# Patient Record
Sex: Female | Born: 1935 | Race: White | Hispanic: No | Smoking: Never smoker
Health system: Southern US, Community
[De-identification: ages and names within clinical notes are randomized; demographics above are authoritative.]

## PROBLEM LIST (undated history)

## (undated) DIAGNOSIS — R002 Palpitations: Secondary | ICD-10-CM

## (undated) DIAGNOSIS — I5032 Chronic diastolic (congestive) heart failure: Secondary | ICD-10-CM

## (undated) DIAGNOSIS — I10 Essential (primary) hypertension: Secondary | ICD-10-CM

## (undated) DIAGNOSIS — J189 Pneumonia, unspecified organism: Secondary | ICD-10-CM

## (undated) DIAGNOSIS — I447 Left bundle-branch block, unspecified: Secondary | ICD-10-CM

## (undated) DIAGNOSIS — R011 Cardiac murmur, unspecified: Secondary | ICD-10-CM

## (undated) DIAGNOSIS — I517 Cardiomegaly: Secondary | ICD-10-CM

## (undated) HISTORY — DX: Left bundle-branch block, unspecified: I44.7

## (undated) HISTORY — DX: Cardiac murmur, unspecified: R01.1

## (undated) HISTORY — PX: EYE SURGERY: SHX253

## (undated) HISTORY — DX: Cardiomegaly: I51.7

## (undated) HISTORY — DX: Pneumonia, unspecified organism: J18.9

## (undated) HISTORY — DX: Palpitations: R00.2

## (undated) HISTORY — DX: Chronic diastolic (congestive) heart failure: I50.32

---

## 1984-06-24 HISTORY — PX: APPENDECTOMY: SHX54

## 1984-06-24 HISTORY — PX: WRIST SURGERY: SHX841

## 2004-09-14 ENCOUNTER — Ambulatory Visit: Payer: Self-pay

## 2005-04-15 ENCOUNTER — Ambulatory Visit: Payer: Self-pay | Admitting: Internal Medicine

## 2005-08-27 ENCOUNTER — Ambulatory Visit: Payer: Self-pay | Admitting: Internal Medicine

## 2006-09-02 ENCOUNTER — Ambulatory Visit: Payer: Self-pay | Admitting: Internal Medicine

## 2007-09-08 ENCOUNTER — Ambulatory Visit: Payer: Self-pay | Admitting: Internal Medicine

## 2008-07-20 ENCOUNTER — Emergency Department: Payer: Self-pay | Admitting: Emergency Medicine

## 2008-12-14 ENCOUNTER — Ambulatory Visit: Payer: Self-pay | Admitting: Internal Medicine

## 2009-09-04 DIAGNOSIS — I10 Essential (primary) hypertension: Secondary | ICD-10-CM | POA: Insufficient documentation

## 2009-09-04 DIAGNOSIS — M810 Age-related osteoporosis without current pathological fracture: Secondary | ICD-10-CM | POA: Insufficient documentation

## 2009-10-05 ENCOUNTER — Ambulatory Visit: Payer: Self-pay | Admitting: Family Medicine

## 2010-06-26 ENCOUNTER — Ambulatory Visit: Payer: Self-pay | Admitting: Family Medicine

## 2010-07-03 ENCOUNTER — Ambulatory Visit: Payer: Self-pay | Admitting: Family Medicine

## 2010-08-23 LAB — HM MAMMOGRAPHY: HM MAMMO: NORMAL

## 2012-07-10 ENCOUNTER — Ambulatory Visit: Payer: Self-pay | Admitting: Family Medicine

## 2013-05-01 ENCOUNTER — Emergency Department: Payer: Self-pay | Admitting: Emergency Medicine

## 2014-08-23 ENCOUNTER — Encounter: Payer: Self-pay | Admitting: Nurse Practitioner

## 2014-08-23 ENCOUNTER — Ambulatory Visit (INDEPENDENT_AMBULATORY_CARE_PROVIDER_SITE_OTHER): Payer: Medicare Other | Admitting: Nurse Practitioner

## 2014-08-23 ENCOUNTER — Encounter (INDEPENDENT_AMBULATORY_CARE_PROVIDER_SITE_OTHER): Payer: Self-pay

## 2014-08-23 VITALS — BP 158/70 | HR 74 | Temp 98.2°F | Resp 12 | Ht 67.0 in | Wt 170.4 lb

## 2014-08-23 DIAGNOSIS — Z7689 Persons encountering health services in other specified circumstances: Secondary | ICD-10-CM

## 2014-08-23 DIAGNOSIS — Z7189 Other specified counseling: Secondary | ICD-10-CM | POA: Diagnosis not present

## 2014-08-23 DIAGNOSIS — I10 Essential (primary) hypertension: Secondary | ICD-10-CM | POA: Diagnosis not present

## 2014-08-23 NOTE — Progress Notes (Signed)
Pre visit review using our clinic review tool, if applicable. No additional management support is needed unless otherwise documented below in the visit note. 

## 2014-08-23 NOTE — Progress Notes (Signed)
Subjective:    Patient ID: Laurie Patel, female    DOB: 1928-05-08, 81 y.o.   MRN: [id number]  HPI  Laurie Patel is a 81 yo female establishing care.   1) New Pt info:   Diet- Normal diet, eats at home   Exercise- No formal, works in yard   Immunizations- See health maintenance tab  Mammogram- Last 3-4 years ago, aged out   Bone Density- 1980's  Colonoscopy- No problems  Eye Exam- Once a year  Dental Exam- Not up to date   2) Chronic Problems-  Irregular heart rhythm- she was told this and believes it may be due to her taking Lisinopril   3) Acute Problems-  SOB since taking Lisinopril   Review of Systems  Constitutional: Negative for fever, chills, diaphoresis, fatigue and unexpected weight change.  HENT: Negative for tinnitus and trouble swallowing.   Laurie Patel: Negative for visual disturbance.  Respiratory: Positive for shortness of breath. Negative for cough, chest tightness and wheezing.   Cardiovascular: Negative for chest pain, palpitations and leg swelling.  Gastrointestinal: Negative for nausea, vomiting, abdominal pain, diarrhea, constipation and blood in stool.  Endocrine: Negative for polydipsia, polyphagia and polyuria.  Genitourinary: Negative for dysuria, vaginal discharge and vaginal pain.  Musculoskeletal: Negative for myalgias, back pain, arthralgias and gait problem.  Skin: Negative for color change and rash.  Neurological: Negative for dizziness, weakness, numbness and headaches.  Hematological: Does not bruise/bleed easily.  Psychiatric/Behavioral: Negative for suicidal ideas and sleep disturbance. The patient is not nervous/anxious.    Past Medical History  Diagnosis Date  . Heart murmur     History   Social History  . Marital Status: Married    Spouse Name: N/A  . Number of Children: N/A  . Years of Education: N/A   Occupational History  . Not on file.   Social History Main Topics  . Smoking status: Never Smoker   . Smokeless tobacco: Never  Used  . Alcohol Use: No  . Drug Use: No  . Sexual Activity: No   Other Topics Concern  . Not on file   Social History Narrative   L[place] in [place]    Owns a [employer]    1 dog    Past Surgical History  Procedure Laterality Date  . Wrist surgery Left 1986    ORIF   . Appendectomy  1986    Family History  Problem Relation Age of Onset  . Cancer Mother 5273    Breast Cancer  . Heart disease Father 7483    Congestive Heart Failure  . COPD Father 3983    Emphysema  . Heart disease Brother 2078    massive heart attack  . Heart disease Maternal Grandmother   . Heart disease Maternal Grandfather   . Early death Paternal Grandmother     Early death unsure age  . Heart disease Paternal Grandfather   . Obesity Paternal Grandfather     No Known Allergies  No current outpatient prescriptions on file prior to visit.   No current facility-administered medications on file prior to visit.       Objective:   Physical Exam  Constitutional: She is oriented to person, place, and time. She appears well-developed and well-nourished. No distress.  BP 158/70 mmHg  Pulse 74  Temp(Src) 98.2 F (36.8 C) (Oral)  Resp 12  Ht 5\' 7"  (1.702 m)  Wt 170 lb 6.4 oz (77.293 kg)  BMI 26.68 kg/m2  SpO2 98%  HENT:  Head: Normocephalic and atraumatic.  Right Ear: External ear normal.  Left Ear: External ear normal.  Molloy: Right eye exhibits no discharge. Left eye exhibits no discharge. No scleral icterus.  Neck: Normal range of motion. Neck supple. No thyromegaly present.  Cardiovascular: Normal rate and regular rhythm.  Exam reveals no gallop and no friction rub.   Murmur heard. Pulmonary/Chest: Effort normal and breath sounds normal. No respiratory distress. She has no wheezes. She has no rales. She exhibits no tenderness.  Lymphadenopathy:    She has no cervical adenopathy.  Neurological: She is alert and oriented to person, place, and time. No cranial nerve deficit. She exhibits  normal muscle tone. Coordination normal.  Skin: Skin is warm and dry. No rash noted. She is not diaphoretic.  Psychiatric: She has a normal mood and affect. Her behavior is normal. Judgment and thought content normal.      Assessment & Plan:

## 2014-08-23 NOTE — Patient Instructions (Signed)
Bring your BP machine to check in the next 3 weeks.   Bring your recorded numbers from home.   Welcome to [facility]!

## 2014-08-27 DIAGNOSIS — I10 Essential (primary) hypertension: Secondary | ICD-10-CM | POA: Insufficient documentation

## 2014-08-27 NOTE — Assessment & Plan Note (Signed)
Discussed acute and chronic issues. Reviewed health maintenance measures, PFSHx, and immunizations. Obtain records from previous facility.   

## 2014-08-27 NOTE — Assessment & Plan Note (Signed)
Asked pt to bring her machine from home to compare with ours. She needs to record numbers also and bring to next visit. Will look into other medications she can try instead of lisinopril. Pt agreeable to this. FU in 3 weeks.

## 2014-09-14 ENCOUNTER — Encounter: Payer: Self-pay | Admitting: Nurse Practitioner

## 2014-09-14 ENCOUNTER — Ambulatory Visit (INDEPENDENT_AMBULATORY_CARE_PROVIDER_SITE_OTHER): Payer: Medicare Other | Admitting: Nurse Practitioner

## 2014-09-14 VITALS — BP 142/66 | HR 77 | Resp 14 | Ht 67.0 in | Wt 170.2 lb

## 2014-09-14 DIAGNOSIS — I10 Essential (primary) hypertension: Secondary | ICD-10-CM

## 2014-09-14 MED ORDER — LISINOPRIL 10 MG PO TABS: 10.0000 mg | ORAL_TABLET | Freq: Every day | ORAL | Status: DC

## 2014-09-14 NOTE — Patient Instructions (Signed)
Keep up the good work

## 2014-09-14 NOTE — Progress Notes (Signed)
   Subjective:    Patient ID: Laurie Patel, female    DOB: Sep 08, 1927, 81 y.o.   MRN: [id number]  HPI  Laurie Patel is a 81 yo female here for a 3 week follow up.   1) Lost 8 lbs before coming here to our clinic.  Put some weight on during holidays 2 lbs down according to home scales Diet is not eating a lunch typically  Trying to eat light and watching salt   2) Takes BP medication in morning (forgets sometimes)  132/60 lowest 175/66 highest range Took fish oil tablets - stopped taking   Did not do well on statins- joint pain   Review of Systems  Constitutional: Negative for fever, chills, diaphoresis and fatigue.  Respiratory: Negative for chest tightness, shortness of breath and wheezing.   Cardiovascular: Negative for chest pain, palpitations and leg swelling.  Gastrointestinal: Negative for nausea, vomiting and diarrhea.  Skin: Negative for rash.  Neurological: Negative for dizziness, weakness, numbness and headaches.  Psychiatric/Behavioral: The patient is not nervous/anxious.       Objective:   Physical Exam  Constitutional: She is oriented to person, place, and time. She appears well-developed and well-nourished. No distress.  BP 142/66 mmHg  Pulse 77  Resp 14  Ht 5\' 7"  (1.702 m)  Wt 170 lb 4 oz (77.225 kg)  BMI 26.66 kg/m2  SpO2 96%   HENT:  Head: Normocephalic and atraumatic.  Right Ear: External ear normal.  Left Ear: External ear normal.  Cardiovascular: Normal rate, regular rhythm and normal heart sounds.  Exam reveals no gallop and no friction rub.   No murmur heard. Pulmonary/Chest: Effort normal and breath sounds normal. No respiratory distress. She has no wheezes. She has no rales. She exhibits no tenderness.  Neurological: She is alert and oriented to person, place, and time. No cranial nerve deficit. She exhibits normal muscle tone. Coordination normal.  Skin: Skin is warm and dry. No rash noted. She is not diaphoretic.  Psychiatric: She has a normal  mood and affect. Her behavior is normal. Judgment and thought content normal.     Assessment & Plan:

## 2014-09-14 NOTE — Progress Notes (Signed)
Pre visit review using our clinic review tool, if applicable. No additional management support is needed unless otherwise documented below in the visit note. 

## 2014-09-20 NOTE — Assessment & Plan Note (Signed)
Improved. Goal according to JNC 8 is below 150/90.   BP Readings from Last 3 Encounters:  09/14/14 142/66  08/23/14 158/70

## 2015-03-17 ENCOUNTER — Encounter: Payer: Self-pay | Admitting: Nurse Practitioner

## 2015-03-17 ENCOUNTER — Ambulatory Visit (INDEPENDENT_AMBULATORY_CARE_PROVIDER_SITE_OTHER): Payer: Medicare Other | Admitting: Nurse Practitioner

## 2015-03-17 VITALS — BP 148/68 | HR 63 | Temp 98.4°F | Resp 14 | Ht 67.0 in | Wt 171.2 lb

## 2015-03-17 DIAGNOSIS — R7989 Other specified abnormal findings of blood chemistry: Secondary | ICD-10-CM

## 2015-03-17 DIAGNOSIS — I10 Essential (primary) hypertension: Secondary | ICD-10-CM | POA: Diagnosis not present

## 2015-03-17 DIAGNOSIS — Z13220 Encounter for screening for lipoid disorders: Secondary | ICD-10-CM

## 2015-03-17 DIAGNOSIS — Z131 Encounter for screening for diabetes mellitus: Secondary | ICD-10-CM | POA: Diagnosis not present

## 2015-03-17 DIAGNOSIS — Z1321 Encounter for screening for nutritional disorder: Secondary | ICD-10-CM

## 2015-03-17 LAB — COMPREHENSIVE METABOLIC PANEL
ALT: 14 U/L (ref 0–35)
AST: 17 U/L (ref 0–37)
Albumin: 4.6 g/dL (ref 3.5–5.2)
Alkaline Phosphatase: 74 U/L (ref 39–117)
BILIRUBIN TOTAL: 0.4 mg/dL (ref 0.2–1.2)
BUN: 14 mg/dL (ref 6–23)
CO2: 31 mEq/L (ref 19–32)
CREATININE: 0.79 mg/dL (ref 0.40–1.20)
Calcium: 9.8 mg/dL (ref 8.4–10.5)
Chloride: 103 mEq/L (ref 96–112)
GFR: 73.42 mL/min (ref >60.00)
GLUCOSE: 100 mg/dL — AB (ref 70–99)
Potassium: 4.2 mEq/L (ref 3.5–5.1)
SODIUM: 142 meq/L (ref 135–145)
Total Protein: 7.1 g/dL (ref 6.0–8.3)

## 2015-03-17 LAB — CBC WITH DIFFERENTIAL/PLATELET
BASOS ABS: 0 10*3/uL (ref 0.0–0.1)
BASOS PCT: 0.5 % (ref 0.0–3.0)
EOS ABS: 0.4 10*3/uL (ref 0.0–0.7)
Eosinophils Relative: 4.6 % (ref 0.0–5.0)
HCT: 45.3 % (ref 36.0–46.0)
Hemoglobin: 15 g/dL (ref 12.0–15.0)
LYMPHS ABS: 1.3 10*3/uL (ref 0.7–4.0)
LYMPHS PCT: 16.1 % (ref 12.0–46.0)
MCHC: 33.1 g/dL (ref 30.0–36.0)
MCV: 83.9 fl (ref 78.0–100.0)
MONO ABS: 0.6 10*3/uL (ref 0.1–1.0)
Monocytes Relative: 7.6 % (ref 3.0–12.0)
NEUTROS ABS: 5.9 10*3/uL (ref 1.4–7.7)
NEUTROS PCT: 71.2 % (ref 43.0–77.0)
Platelets: 240 10*3/uL (ref 150.0–400.0)
RBC: 5.39 Mil/uL — ABNORMAL HIGH (ref 3.87–5.11)
RDW: 14.6 % (ref 11.5–15.5)
WBC: 8.3 10*3/uL (ref 4.0–10.5)

## 2015-03-17 LAB — LIPID PANEL
Cholesterol: 209 mg/dL — ABNORMAL HIGH (ref 0–200)
HDL: 31.1 mg/dL — ABNORMAL LOW (ref >39.00)
NONHDL: 178.05
Total CHOL/HDL Ratio: 7
Triglycerides: 216 mg/dL — ABNORMAL HIGH (ref 0.0–149.0)
VLDL: 43.2 mg/dL — ABNORMAL HIGH (ref 0.0–40.0)

## 2015-03-17 LAB — LDL CHOLESTEROL, DIRECT: LDL DIRECT: 174 mg/dL

## 2015-03-17 LAB — HEMOGLOBIN A1C: HEMOGLOBIN A1C: 6.7 % — AB (ref 4.6–6.5)

## 2015-03-17 LAB — VITAMIN D 25 HYDROXY (VIT D DEFICIENCY, FRACTURES): VITD: 28.93 ng/mL — ABNORMAL LOW (ref 30.00–100.00)

## 2015-03-17 NOTE — Progress Notes (Signed)
Patient ID: Laurie Patel, female    DOB: 10-12-1927  Age: 81 y.o. MRN: [id number]  CC: Follow-up   HPI Laurie Patel presents for 6 month follow up of HTN.   1) BP stable. Forgets to take her medication occasionally. Pt had flu vaccine this past Wednesday at [employer].   History [name] has a past medical history of Heart murmur.   She has past surgical history that includes Wrist surgery (Left, 1986) and Appendectomy (1986).   Her family history includes COPD (age of onset: 23) in her father; Cancer (age of onset: 22) in her mother; Early death in her paternal grandmother; Heart disease in her maternal grandfather, maternal grandmother, and paternal grandfather; Heart disease (age of onset: 89) in her brother; Heart disease (age of onset: 44) in her father; Obesity in her paternal grandfather.She reports that she has never smoked. She has never used smokeless tobacco. She reports that she does not drink alcohol or use illicit drugs.  Outpatient Prescriptions Prior to Visit  Medication Sig Dispense Refill  . aspirin 81 MG EC tablet Take 81 mg by mouth daily. Swallow whole.    . fexofenadine (ALLEGRA) 180 MG tablet Take 180 mg by mouth daily.    Marland Kitchen lisinopril (PRINIVIL,ZESTRIL) 10 MG tablet Take 1 tablet (10 mg total) by mouth daily. 30 tablet 5  . Multiple Vitamins-Calcium (ONE-A-DAY WOMENS PO) Take 1 tablet by mouth daily.     No facility-administered medications prior to visit.    ROS Review of Systems  Constitutional: Negative for fever, chills, diaphoresis and fatigue.  Respiratory: Negative for chest tightness, shortness of breath and wheezing.   Cardiovascular: Negative for chest pain, palpitations and leg swelling.  Gastrointestinal: Negative for nausea, vomiting and diarrhea.  Skin: Negative for rash.  Neurological: Negative for dizziness, weakness, numbness and headaches.  Psychiatric/Behavioral: The patient is not nervous/anxious.     Objective:  BP 148/68 mmHg   Pulse 63  Temp(Src) 98.4 F (36.9 C)  Resp 14  Ht  (1.702 m)  Wt 171 lb 3.2 oz (77.656 kg)  BMI 26.81 kg/m2  SpO2 97%  Physical Exam  Constitutional: She is oriented to person, place, and time. She appears well-developed and well-nourished. No distress.  HENT:  Head: Normocephalic and atraumatic.  Right Ear: External ear normal.  Left Ear: External ear normal.  Mish:  Glasses  Cardiovascular: Normal rate, regular rhythm and normal heart sounds.  Exam reveals no gallop and no friction rub.   No murmur heard. Pulmonary/Chest: Effort normal and breath sounds normal. No respiratory distress. She has no wheezes. She has no rales. She exhibits no tenderness.  Neurological: She is alert and oriented to person, place, and time. No cranial nerve deficit. She exhibits normal muscle tone. Coordination normal.  Skin: Skin is warm and dry. No rash noted. She is not diaphoretic.  Multiple Nevi  Psychiatric: She has a normal mood and affect. Her behavior is normal. Judgment and thought content normal.   Assessment & Plan:   [name] was seen today for follow-up.  Diagnoses and all orders for this visit:  Essential hypertension -     CBC with Differential/Platelet -     Comprehensive metabolic panel  Screening for hyperlipidemia -     Lipid panel  Screening for diabetes mellitus -     HgB A1c  Encounter for vitamin deficiency screening -     Vitamin D (25 hydroxy)   I am having Ms. Brodbeck maintain her Multiple Vitamins-Calcium (  ONE-A-DAY WOMENS PO), fexofenadine, aspirin, and lisinopril.  No orders of the defined types were placed in this encounter.     Follow-up: Return in about 6 months (around 09/14/2015) for Follow up.

## 2015-03-17 NOTE — Assessment & Plan Note (Addendum)
BP Readings from Last 3 Encounters:  03/17/15 148/68  09/14/14 142/66  08/23/14 158/70   Stable on Lisinopril. At goal according to JNC 8 for age group. Will obtain labs today. FU in 6 months.

## 2015-03-17 NOTE — Progress Notes (Signed)
Pre visit review using our clinic review tool, if applicable. No additional management support is needed unless otherwise documented below in the visit note. 

## 2015-03-17 NOTE — Patient Instructions (Signed)
Please visit the lab. See you in 6 months.

## 2015-03-20 ENCOUNTER — Other Ambulatory Visit: Payer: Self-pay | Admitting: Nurse Practitioner

## 2015-09-14 ENCOUNTER — Ambulatory Visit (INDEPENDENT_AMBULATORY_CARE_PROVIDER_SITE_OTHER): Payer: Medicare Other | Admitting: Nurse Practitioner

## 2015-09-14 ENCOUNTER — Encounter: Payer: Self-pay | Admitting: Nurse Practitioner

## 2015-09-14 VITALS — BP 138/62 | HR 78 | Temp 98.0°F | Resp 14 | Ht 67.0 in | Wt 176.0 lb

## 2015-09-14 DIAGNOSIS — I10 Essential (primary) hypertension: Secondary | ICD-10-CM | POA: Diagnosis not present

## 2015-09-14 DIAGNOSIS — R7309 Other abnormal glucose: Secondary | ICD-10-CM | POA: Diagnosis not present

## 2015-09-14 DIAGNOSIS — E119 Type 2 diabetes mellitus without complications: Secondary | ICD-10-CM | POA: Insufficient documentation

## 2015-09-14 LAB — CBC WITH DIFFERENTIAL/PLATELET
BASOS ABS: 0 10*3/uL (ref 0.0–0.1)
BASOS PCT: 0.5 % (ref 0.0–3.0)
EOS PCT: 2.6 % (ref 0.0–5.0)
Eosinophils Absolute: 0.2 10*3/uL (ref 0.0–0.7)
HCT: 44.8 % (ref 36.0–46.0)
Hemoglobin: 15 g/dL (ref 12.0–15.0)
LYMPHS ABS: 1.2 10*3/uL (ref 0.7–4.0)
Lymphocytes Relative: 15.1 % (ref 12.0–46.0)
MCHC: 33.5 g/dL (ref 30.0–36.0)
MCV: 83.1 fl (ref 78.0–100.0)
MONOS PCT: 5 % (ref 3.0–12.0)
Monocytes Absolute: 0.4 10*3/uL (ref 0.1–1.0)
NEUTROS ABS: 6.4 10*3/uL (ref 1.4–7.7)
NEUTROS PCT: 76.8 % (ref 43.0–77.0)
PLATELETS: 233 10*3/uL (ref 150.0–400.0)
RBC: 5.39 Mil/uL — ABNORMAL HIGH (ref 3.87–5.11)
RDW: 14.5 % (ref 11.5–15.5)
WBC: 8.3 10*3/uL (ref 4.0–10.5)

## 2015-09-14 LAB — BASIC METABOLIC PANEL
BUN: 15 mg/dL (ref 6–23)
CALCIUM: 9.8 mg/dL (ref 8.4–10.5)
CHLORIDE: 100 meq/L (ref 96–112)
CO2: 30 meq/L (ref 19–32)
Creatinine, Ser: 0.81 mg/dL (ref 0.40–1.20)
GFR: 71.25 mL/min (ref >60.00)
GLUCOSE: 239 mg/dL — AB (ref 70–99)
Potassium: 4.6 mEq/L (ref 3.5–5.1)
SODIUM: 139 meq/L (ref 135–145)

## 2015-09-14 LAB — HEMOGLOBIN A1C: Hgb A1c MFr Bld: 7.2 % — ABNORMAL HIGH (ref 4.6–6.5)

## 2015-09-14 NOTE — Patient Instructions (Addendum)
If you would like to see a cardiologist before next visit, you can let me know and I will place a referral.   Please visit the lab today.   Great to see you!

## 2015-09-14 NOTE — Assessment & Plan Note (Signed)
See overview. Repeating A1c today Started Vit D and Fish oil  Pt is a healthy 82 yo and I advised working on healthy diet and exercise.

## 2015-09-14 NOTE — Progress Notes (Signed)
Patient ID: Laurie Patel, female    DOB: 1928-03-13  Age: 82 y.o. MRN: [id number]  CC: Hypertension   HPI Laurie Patel presents for 6 month follow up of HTN.   1) HTN Patient is here for her 6 month follow up of HTN and she reports compliance with her medications. She has started fish oil.   She denies chest pain, SOB, DOE, and visual changes.   Seeing Dr. Adele Schilder (sorry for spelling) on April 4th  Saw Dr. Juliann Pares in the past, Declines at this time seeing a cardiologist. Family history of heart disease.   Health Maintenance-   Asked about urinary status- no difficulty or concerns with urination Other than normal bumps and trips, no falls or difficulty functioning on a daily basis PHQ-2= 0   History Laurie Patel has a past medical history of Heart murmur.   She has past surgical history that includes Wrist surgery (Left, 1986); Appendectomy (1986); and Eye surgery (Bilateral, 1995 and 1999).   Her family history includes COPD (age of onset: 93) in her father; Cancer (age of onset: 33) in her mother; Early death in her paternal grandmother; Heart disease in her maternal grandfather, maternal grandmother, and paternal grandfather; Heart disease (age of onset: 65) in her brother; Heart disease (age of onset: 24) in her father; Obesity in her paternal grandfather.She reports that she has never smoked. She has never used smokeless tobacco. She reports that she does not drink alcohol or use illicit drugs.  Outpatient Prescriptions Prior to Visit  Medication Sig Dispense Refill  . aspirin 81 MG EC tablet Take 81 mg by mouth daily. Swallow whole.    . fexofenadine (ALLEGRA) 180 MG tablet Take 180 mg by mouth daily.    Marland Kitchen lisinopril (PRINIVIL,ZESTRIL) 10 MG tablet TAKE 1 TABLET (10 MG TOTAL) BY MOUTH DAILY. 30 tablet 6  . Multiple Vitamins-Calcium (ONE-A-DAY WOMENS PO) Take 1 tablet by mouth daily.     No facility-administered medications prior to visit.    ROS Review of Systems   Constitutional: Negative for fever, chills, diaphoresis and fatigue.  Respiratory: Negative for chest tightness, shortness of breath and wheezing.   Cardiovascular: Negative for chest pain, palpitations and leg swelling.  Gastrointestinal: Negative for nausea, vomiting and diarrhea.  Skin: Negative for rash.  Neurological: Negative for dizziness, weakness, numbness and headaches.  Psychiatric/Behavioral: The patient is not nervous/anxious.     Objective:  BP 138/62 mmHg  Pulse 78  Temp(Src) 98 F (36.7 C)  Resp 14  Ht  (1.702 m)  Wt 176 lb (79.833 kg)  BMI 27.56 kg/m2  SpO2 95%  Physical Exam  Constitutional: She is oriented to person, place, and time. She appears well-developed and well-nourished. No distress.  HENT:  Head: Normocephalic and atraumatic.  Right Ear: External ear normal.  Left Ear: External ear normal.  Cardiovascular: Normal rate and regular rhythm.  Exam reveals no gallop and no friction rub.   Murmur heard. Pulmonary/Chest: Effort normal and breath sounds normal. No respiratory distress. She has no wheezes. She has no rales. She exhibits no tenderness.  Neurological: She is alert and oriented to person, place, and time. No cranial nerve deficit. She exhibits normal muscle tone. Coordination normal.  Skin: Skin is warm and dry. No rash noted. She is not diaphoretic.  Psychiatric: She has a normal mood and affect. Her behavior is normal. Judgment and thought content normal.   Assessment & Plan:   Laurie Patel was seen today for hypertension.  Diagnoses and  all orders for this visit:  Elevated hemoglobin A1c -     Basic Metabolic Panel (BMET) -     CBC with Differential/Platelet -     HgB A1c  Essential hypertension -     Basic Metabolic Panel (BMET) -     CBC with Differential/Platelet -     HgB A1c   I am having Laurie Patel maintain her Multiple Vitamins-Calcium (ONE-A-DAY WOMENS PO), fexofenadine, aspirin, lisinopril, cholecalciferol, and Omega  3.  Meds ordered this encounter  Medications  . cholecalciferol (VITAMIN D) 1000 units tablet    Sig: Take 1,000 Units by mouth daily.  . Omega 3 1200 MG CAPS    Sig: Take 1,200 mg by mouth 2 (two) times daily.     Follow-up: Return in about 6 months (around 03/16/2016) for Follow up.

## 2015-09-14 NOTE — Assessment & Plan Note (Signed)
BP Readings from Last 3 Encounters:  09/14/15 138/62  03/17/15 148/68  09/14/14 142/66   Stable currently. No change to meds at this time. Pt declines cardiology referral at this time.

## 2015-09-22 ENCOUNTER — Other Ambulatory Visit: Payer: Self-pay | Admitting: Nurse Practitioner

## 2015-09-22 MED ORDER — METFORMIN HCL 500 MG PO TABS: 500.0000 mg | ORAL_TABLET | Freq: Every day | ORAL | Status: DC

## 2015-09-26 DIAGNOSIS — H35371 Puckering of macula, right eye: Secondary | ICD-10-CM | POA: Diagnosis not present

## 2015-11-23 ENCOUNTER — Telehealth: Payer: Self-pay | Admitting: *Deleted

## 2015-11-23 MED ORDER — LISINOPRIL 10 MG PO TABS: ORAL_TABLET | ORAL | Status: DC

## 2015-11-23 NOTE — Telephone Encounter (Signed)
Patient has requested a medication refill for lisinopril Pharmacy [facility]([place])

## 2016-03-18 ENCOUNTER — Ambulatory Visit: Payer: Medicare Other | Admitting: Nurse Practitioner

## 2016-03-21 ENCOUNTER — Encounter (INDEPENDENT_AMBULATORY_CARE_PROVIDER_SITE_OTHER): Payer: Self-pay

## 2016-03-21 ENCOUNTER — Ambulatory Visit (INDEPENDENT_AMBULATORY_CARE_PROVIDER_SITE_OTHER): Payer: Medicare Other | Admitting: Family Medicine

## 2016-03-21 ENCOUNTER — Encounter: Payer: Self-pay | Admitting: Family Medicine

## 2016-03-21 VITALS — BP 118/58 | HR 76 | Temp 98.4°F | Resp 18 | Wt 170.5 lb

## 2016-03-21 DIAGNOSIS — E785 Hyperlipidemia, unspecified: Secondary | ICD-10-CM | POA: Diagnosis not present

## 2016-03-21 DIAGNOSIS — I10 Essential (primary) hypertension: Secondary | ICD-10-CM

## 2016-03-21 DIAGNOSIS — R7309 Other abnormal glucose: Secondary | ICD-10-CM

## 2016-03-21 DIAGNOSIS — J309 Allergic rhinitis, unspecified: Secondary | ICD-10-CM | POA: Insufficient documentation

## 2016-03-21 LAB — COMPREHENSIVE METABOLIC PANEL
ALBUMIN: 4.2 g/dL (ref 3.5–5.2)
ALT: 15 U/L (ref 0–35)
AST: 17 U/L (ref 0–37)
Alkaline Phosphatase: 63 U/L (ref 39–117)
BUN: 11 mg/dL (ref 6–23)
CHLORIDE: 102 meq/L (ref 96–112)
CO2: 31 mEq/L (ref 19–32)
Calcium: 9.2 mg/dL (ref 8.4–10.5)
Creatinine, Ser: 0.75 mg/dL (ref 0.40–1.20)
GFR: 77.77 mL/min (ref >60.00)
GLUCOSE: 174 mg/dL — AB (ref 70–99)
POTASSIUM: 4.1 meq/L (ref 3.5–5.1)
SODIUM: 141 meq/L (ref 135–145)
Total Bilirubin: 0.4 mg/dL (ref 0.2–1.2)
Total Protein: 6.8 g/dL (ref 6.0–8.3)

## 2016-03-21 LAB — HEMOGLOBIN A1C: HEMOGLOBIN A1C: 7.1 % — AB (ref 4.6–6.5)

## 2016-03-21 LAB — LDL CHOLESTEROL, DIRECT: LDL DIRECT: 147 mg/dL

## 2016-03-21 MED ORDER — LISINOPRIL 5 MG PO TABS: 5.0000 mg | ORAL_TABLET | Freq: Every day | ORAL | 3 refills | Status: DC

## 2016-03-21 NOTE — Patient Instructions (Signed)
Nice to meet you. We're going to check an A1c and your kidney function today. Please continue your current medications.

## 2016-03-21 NOTE — Assessment & Plan Note (Signed)
Continue Allegra.

## 2016-03-21 NOTE — Assessment & Plan Note (Signed)
Elevated on last check at 7.2. We'll recheck today. We'll discuss medication management or continued diet and exercise.

## 2016-03-21 NOTE — Assessment & Plan Note (Signed)
Elevated LDL on last check. We'll repeat this today. She'll continue the fish oil.

## 2016-03-21 NOTE — Assessment & Plan Note (Signed)
Slightly low diastolically today. This in conjunction with her reported lightheadedness on standing up makes me think we are overtreating her blood pressure. We will back off on her lisinopril to 5 mg daily. She'll continue to monitor for lightheadedness and if it does not improve with this change she will let us know.

## 2016-03-21 NOTE — Progress Notes (Signed)
  Tommi Rumps, MD Phone: [phone]  Laurie Patel is a 82 y.o. female who presents today for f/u.  HYPERTENSION Disease Monitoring: Blood pressure range-not checking Chest pain- no      Dyspnea- no Medications: Compliance- taking lisinopril     light headedness-occasionally gets lightheaded if she bends over and stands up too quickly. Does not occur at any other times. No other symptoms with this.  Edema- no  Elevated A1c: Patient is not aware that she had diabetes. A1c was 7.2 on last check. She's not taking any medication for this. No polyuria or polydipsia. She has been snacking less since her last lab work. Is doing some mild exercise.  HYPERLIPIDEMIA Disease Monitoring: See symptoms for Hypertension Medications: Compliance- takes fish oil. Reports she was tried on a statin previously and did not tolerate it due to breathing issues.   Allergic rhinitis: Notes worse during the summer when she is in the air conditioning. Gets rhinorrhea and some congestion. Takes Allegra with good benefit.   PMH: nonsmoker.   ROS see history of present illness  Objective  Physical Exam Vitals:   03/21/16 0859  BP: (!) 118/58  Pulse: 76  Resp: 18  Temp: 98.4 F (36.9 C)    BP Readings from Last 3 Encounters:  03/21/16 (!) 118/58  09/14/15 138/62  03/17/15 (!) 148/68   Wt Readings from Last 3 Encounters:  03/21/16 170 lb 8 oz (77.3 kg)  09/14/15 176 lb (79.8 kg)  03/17/15 171 lb 3.2 oz (77.7 kg)    Physical Exam  Constitutional: No distress.  Cardiovascular: Normal rate, regular rhythm and normal heart sounds.   Pulmonary/Chest: Effort normal and breath sounds normal.  Neurological: She is alert. Gait normal.  Skin: Skin is warm and dry. She is not diaphoretic.     Assessment/Plan: Please see individual problem list.  HTN (hypertension) Slightly low diastolically today. This in conjunction with her reported lightheadedness on standing up makes me think we are  overtreating her blood pressure. We will back off on her lisinopril to 5 mg daily. She'll continue to monitor for lightheadedness and if it does not improve with this change she will let us know.  Elevated hemoglobin A1c Elevated on last check at 7.2. We'll recheck today. We'll discuss medication management or continued diet and exercise.  Hyperlipidemia Elevated LDL on last check. We'll repeat this today. She'll continue the fish oil.  Allergic rhinitis Continue Allegra.   Orders Placed This Encounter  Procedures  . HgB A1c  . Comp Met (CMET)  . Direct LDL    Meds ordered this encounter  Medications  . lisinopril (PRINIVIL,ZESTRIL) 5 MG tablet    Sig: Take 1 tablet (5 mg total) by mouth daily.    Dispense:  90 tablet    Refill:  Farmer, MD [place]

## 2016-06-20 ENCOUNTER — Ambulatory Visit (INDEPENDENT_AMBULATORY_CARE_PROVIDER_SITE_OTHER): Payer: Medicare Other | Admitting: Family Medicine

## 2016-06-20 ENCOUNTER — Encounter: Payer: Self-pay | Admitting: Family Medicine

## 2016-06-20 VITALS — BP 128/60 | HR 86 | Temp 97.9°F | Wt 172.4 lb

## 2016-06-20 DIAGNOSIS — J309 Allergic rhinitis, unspecified: Secondary | ICD-10-CM

## 2016-06-20 DIAGNOSIS — E785 Hyperlipidemia, unspecified: Secondary | ICD-10-CM

## 2016-06-20 DIAGNOSIS — R002 Palpitations: Secondary | ICD-10-CM

## 2016-06-20 DIAGNOSIS — E119 Type 2 diabetes mellitus without complications: Secondary | ICD-10-CM | POA: Diagnosis not present

## 2016-06-20 DIAGNOSIS — I10 Essential (primary) hypertension: Secondary | ICD-10-CM

## 2016-06-20 LAB — CBC
HEMATOCRIT: 43.5 % (ref 36.0–46.0)
Hemoglobin: 14.9 g/dL (ref 12.0–15.0)
MCHC: 34.2 g/dL (ref 30.0–36.0)
MCV: 82.9 fl (ref 78.0–100.0)
PLATELETS: 225 10*3/uL (ref 150.0–400.0)
RBC: 5.24 Mil/uL — AB (ref 3.87–5.11)
RDW: 14.2 % (ref 11.5–15.5)
WBC: 9.3 10*3/uL (ref 4.0–10.5)

## 2016-06-20 LAB — TSH: TSH: 2.81 u[IU]/mL (ref 0.35–4.50)

## 2016-06-20 LAB — COMPREHENSIVE METABOLIC PANEL
ALT: 14 U/L (ref 0–35)
AST: 16 U/L (ref 0–37)
Albumin: 4.5 g/dL (ref 3.5–5.2)
Alkaline Phosphatase: 73 U/L (ref 39–117)
BUN: 10 mg/dL (ref 6–23)
CALCIUM: 9.6 mg/dL (ref 8.4–10.5)
CHLORIDE: 101 meq/L (ref 96–112)
CO2: 34 meq/L — AB (ref 19–32)
Creatinine, Ser: 0.75 mg/dL (ref 0.40–1.20)
GFR: 77.73 mL/min (ref >60.00)
GLUCOSE: 177 mg/dL — AB (ref 70–99)
Potassium: 4.3 mEq/L (ref 3.5–5.1)
Sodium: 140 mEq/L (ref 135–145)
Total Bilirubin: 0.4 mg/dL (ref 0.2–1.2)
Total Protein: 6.8 g/dL (ref 6.0–8.3)

## 2016-06-20 LAB — HEMOGLOBIN A1C: Hgb A1c MFr Bld: 7.3 % — ABNORMAL HIGH (ref 4.6–6.5)

## 2016-06-20 NOTE — Patient Instructions (Signed)
Nice to see you. We will get you to see cardiology for further evaluation of your PVCs. We'll get some lab work as well today. I would try Claritin for your allergy symptoms. I would avoid Allegra-D. If you develop persistent palpitations, chest pain, shortness of breath, or any new or changing symptoms please seek medical attention immediately.

## 2016-06-20 NOTE — Progress Notes (Signed)
Pre visit review using our clinic review tool, if applicable. No additional management support is needed unless otherwise documented below in the visit note. 

## 2016-06-20 NOTE — Progress Notes (Signed)
Laurie Rumps, MD Phone: [phone]  Laurie Patel is a 82 y.o. female who presents today for follow-up.  HYPERTENSION Disease Monitoring: Blood pressure range-122-139/54-69  Chest pain- no      Dyspnea- no Medications: Compliance- taking lisinopril Lightheadedness- improved   Edema- no  DIABETES Disease Monitoring: Blood Sugar ranges-not checking Polyuria/phagia/dipsia- no       Last A1c 7.1. Diet controlled. Exercises by using a kettle bell and walking.  HYPERLIPIDEMIA Denies claudication Medications: Compliance- taking fish oil.   Allergic rhinitis: Patient notes she is currently taking Allegra. Has not helped as much recently. Gets some rhinorrhea and postnasal drip. Some sinus congestion. Is thinking about using Allegra-D.  Palpitations: Patient notes on 2 occasions over the last several months she has had what she describes as feeling as though her heart is pumping hard and a little faster than usual. Lasts for several minutes and then goes away on its own. She has no chest pain or shortness of breath with this. Has not seen cardiology in some time. She is currently asymptomatic.   PMH: nonsmoker.   ROS see history of present illness  Objective  Physical Exam Vitals:   06/20/16 0957 06/20/16 1034  BP: (!) 158/62 128/60  Pulse: 86   Temp: 97.9 F (36.6 C)     BP Readings from Last 3 Encounters:  06/20/16 128/60  03/21/16 (!) 118/58  09/14/15 138/62   Wt Readings from Last 3 Encounters:  06/20/16 172 lb 6.4 oz (78.2 kg)  03/21/16 170 lb 8 oz (77.3 kg)  09/14/15 176 lb (79.8 kg)    Physical Exam  Constitutional: No distress.  HENT:  Head: Normocephalic and atraumatic.  Mouth/Throat: Oropharynx is clear and moist. No oropharyngeal exudate.  [place]: Conjunctivae are normal. Pupils are equal, round, and reactive to light.  Cardiovascular: Normal rate, regular rhythm and normal heart sounds.   Pulmonary/Chest: Effort normal and breath sounds normal.    Musculoskeletal: She exhibits no edema.  Neurological: She is alert.  Skin: Skin is warm. She is not diaphoretic.   EKG: Normal sinus rhythm, rate 88, ventricular bigeminy noted, old left bundle branch block noted, frequency of PVCs is increased from most recent EKG, otherwise similar  Assessment/Plan: Please see individual problem list.  HTN (hypertension) At goal on recheck. Continue current medications.  Allergic rhinitis Continues to have issues with this. I suggested that she try Claritin and avoid Allegra-D.  Diabetes ([place]) Well-controlled for age. Discussed continued diet and exercise. Recheck A1c today.  Hyperlipidemia Doing well on fish oil. Continue to monitor.  Palpitations Likely related to frequent PVCs as noted on her EKG. Her left bundle branch block is old and seen on review of prior EKG. She is asymptomatic currently. Given frequent PVCs we will have her see cardiology and a referral will be placed. We will try to get her in to see them in the next 1-2 weeks. Given return precautions.   Orders Placed This Encounter  Procedures  . TSH  . Comp Met (CMET)  . CBC  . HgB A1c  . Ambulatory referral to Cardiology    Referral Priority:   Routine    Referral Type:   Consultation    Referral Reason:   Specialty Services Required    Requested Specialty:   Cardiology    Number of Visits Requested:   1  . EKG 12-Lead    No orders of the defined types were placed in this encounter.   Laurie Rumps, MD [facility]  [employer]

## 2016-06-21 DIAGNOSIS — R002 Palpitations: Secondary | ICD-10-CM | POA: Insufficient documentation

## 2016-06-21 NOTE — Assessment & Plan Note (Signed)
Continues to have issues with this. I suggested that she try Claritin and avoid Allegra-D.

## 2016-06-21 NOTE — Assessment & Plan Note (Signed)
Doing well on fish oil. Continue to monitor.

## 2016-06-21 NOTE — Assessment & Plan Note (Signed)
At goal on recheck. Continue current medications. 

## 2016-06-21 NOTE — Assessment & Plan Note (Signed)
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 Her left bundle branch block is old and seen on review of prior EKG. She is asymptomatic currently. Given frequent PVCs we will have her see cardiology and a referral will be placed. We will try to get her in to see them in the next 1-2 weeks. Given return precautions.

## 2016-06-21 NOTE — Assessment & Plan Note (Signed)
Well-controlled for age. Discussed continued diet and exercise. Recheck A1c today.

## 2016-06-25 ENCOUNTER — Ambulatory Visit (INDEPENDENT_AMBULATORY_CARE_PROVIDER_SITE_OTHER): Payer: Medicare Other | Admitting: Cardiology

## 2016-06-25 ENCOUNTER — Encounter: Payer: Self-pay | Admitting: Cardiology

## 2016-06-25 VITALS — BP 160/60 | HR 89 | Ht 66.0 in | Wt 169.5 lb

## 2016-06-25 DIAGNOSIS — I493 Ventricular premature depolarization: Secondary | ICD-10-CM

## 2016-06-25 DIAGNOSIS — I10 Essential (primary) hypertension: Secondary | ICD-10-CM | POA: Diagnosis not present

## 2016-06-25 DIAGNOSIS — R002 Palpitations: Secondary | ICD-10-CM | POA: Diagnosis not present

## 2016-06-25 NOTE — Progress Notes (Signed)
Cardiology Office Note   Date:  06/25/2016   ID:  Laurie Patel, DOB 03/05/1928, MRN [id number]  Referring Doctor:  Marikay Alar, MD   Cardiologist:   Almond Lint, MD   Reason for consultation:  Chief Complaint  Patient presents with  . other    New Patient. Referred by Dr.Sonnenburg for papitations. EKG 12/28 at PCP. Pt states when she doubles her lisinopril dose (10mg ) she feels much better. Reviewed meds with pt verbally.      History of Present Illness: Laurie Patel is a 83 y.o. female who presents for Palpitations. Recent EKG by PCP noted to have PVCs. Palpitations have been going on for years now. They're intermittent throughout the day, no bruit together precipitating factors per she feels that when she takes the higher dose of lisinopril, she feels much better overall. Symptoms in the chest. Mild in intensity. No loss of consciousness.  Patient has shortness of breath but this has improved over the last year with weight loss. Patient denies chest pain, chest discomfort. No PND, orthopnea, edema.   ROS:  Please see the history of present illness. Aside from mentioned under HPI, all other systems are reviewed and negative.     Past Medical History:  Diagnosis Date  . Heart murmur     Past Surgical History:  Procedure Laterality Date  . APPENDECTOMY  1986  . EYE SURGERY Bilateral 1995 and 1999   Lens implants  . WRIST SURGERY Left 1986   ORIF      reports that she has never smoked. She has never used smokeless tobacco. She reports that she does not drink alcohol or use drugs.   family history includes COPD (age of onset: 26) in her father; Cancer (age of onset: 69) in her mother; Early death in her paternal grandmother; Heart disease in her maternal grandfather, maternal grandmother, and paternal grandfather; Heart disease (age of onset: 69) in her brother; Heart disease (age of onset: 83) in her father; Obesity in her paternal grandfather.   Outpatient  Medications Prior to Visit  Medication Sig Dispense Refill  . aspirin 81 MG EC tablet Take 81 mg by mouth daily. Swallow whole.    . fexofenadine (ALLEGRA) 180 MG tablet Take 180 mg by mouth daily.    Marland Kitchen lisinopril (PRINIVIL,ZESTRIL) 5 MG tablet Take 1 tablet (5 mg total) by mouth daily. (Patient taking differently: Take 10 mg by mouth daily. ) 90 tablet 3  . Multiple Vitamins-Calcium (ONE-A-DAY WOMENS PO) Take 1 tablet by mouth daily.    . Omega 3 1200 MG CAPS Take 1,200 mg by mouth 1 day or 1 dose.     . cholecalciferol (VITAMIN D) 1000 units tablet Take 1,000 Units by mouth daily.     No facility-administered medications prior to visit.      Allergies: Patient has no known allergies.    PHYSICAL EXAM: VS:  BP (!) 160/60 (BP Location: Right Arm, Patient Position: Sitting, Cuff Size: Normal)   Pulse 89   Ht 5\' 6"  (1.676 m)   Wt 169 lb 8 oz (76.9 kg)   BMI 27.36 kg/m  , Body mass index is 27.36 kg/m. Wt Readings from Last 3 Encounters:  06/25/16 169 lb 8 oz (76.9 kg)  06/20/16 172 lb 6.4 oz (78.2 kg)  03/21/16 170 lb 8 oz (77.3 kg)    GENERAL:  well developed, well nourished, not in acute distress HEENT: normocephalic, pink conjunctivae, anicteric sclerae, no xanthelasma, normal dentition, oropharynx clear  NECK:  no neck vein engorgement, JVP normal, no hepatojugular reflux, carotid upstroke brisk and symmetric, no bruit, no thyromegaly, no lymphadenopathy LUNGS:  good respiratory effort, clear to auscultation bilaterally CV:  PMI not displaced, no thrills, no lifts, S1 and S2 within normal limits, no palpable S3 or S4, no murmurs, no rubs, no gallops ABD:  Soft, nontender, nondistended, normoactive bowel sounds, no abdominal aortic bruit, no hepatomegaly, no splenomegaly MS: nontender back, no kyphosis, no scoliosis, no joint deformities EXT:  2+ DP/PT pulses, no edema, no varicosities, no cyanosis, no clubbing SKIN: warm, nondiaphoretic, normal turgor, no ulcers NEUROPSYCH:  alert, oriented to person, place, and time, sensory/motor grossly intact, normal mood, appropriate affect  Recent Labs: 06/20/2016: ALT 14; BUN 10; Creatinine, Ser 0.75; Hemoglobin 14.9; Platelets 225.0; Potassium 4.3; Sodium 140; TSH 2.81   Lipid Panel    Component Value Date/Time   CHOL 209 (H) 03/17/2015 1056   TRIG 216.0 (H) 03/17/2015 1056   HDL 31.10 (L) 03/17/2015 1056   CHOLHDL 7 03/17/2015 1056   VLDL 43.2 (H) 03/17/2015 1056   LDLDIRECT 147.0 03/21/2016 0926     Other studies Reviewed:  EKG:  The ekg from 06/20/2016 was personally reviewed by me and it revealed sinus rhythm, PVCs, left bundle branch block.  Additional studies/ records that were reviewed personally reviewed by me today include: None available   ASSESSMENT AND PLAN:  Palpitations, PVCs Left bundle-branch block - noted on previous EKGs Recommend 24-hour Holter monitor. Recommend echocardiogram. Patient reports that she has previously seen Dr. Juliann Paresallwood and had a stress test. We'll need to obtain official report of this.  Hypertension Patient reports the blood pressures at home are much better than 2 days. There is in the 120s to 130s systolic. Continue blood pressure log. Goal is less than 30/80.  Current medicines are reviewed at length with the patient today.  The patient does not have concerns regarding medicines.  Labs/ tests ordered today include:  Orders Placed This Encounter  Procedures  . Holter monitor - 24 hour  . EKG 12-Lead  . ECHOCARDIOGRAM COMPLETE    I had a lengthy and detailed discussion with the patient regarding diagnoses, prognosis, diagnostic options, treatment options , and side effects of medications.   I counseled the patient on importance of lifestyle modification including heart healthy diet, regular physical activity .   Disposition:   FU with undersigned after tests   Signed, Almond LintAileen Quintel Mccalla, MD  06/25/2016 3:50 PM    [facility]  This note  was generated in part with voice recognition software and I apologize for any typographical errors that were not detected and corrected.

## 2016-06-25 NOTE — Patient Instructions (Addendum)
Testing/Procedures: Your physician has recommended that you wear a holter monitor. Holter monitors are medical devices that record the heart's electrical activity. Doctors most often use these monitors to diagnose arrhythmias. Arrhythmias are problems with the speed or rhythm of the heartbeat. The monitor is a small, portable device. You can wear one while you do your normal daily activities. This is usually used to diagnose what is causing palpitations/syncope (passing out).  Your physician has requested that you have an echocardiogram. Echocardiography is a painless test that uses sound waves to create images of your heart. It provides your doctor with information about the size and shape of your heart and how well your heart's chambers and valves are working. This procedure takes approximately one hour. There are no restrictions for this procedure.  Your physician has requested that you regularly monitor and record your blood pressure readings at home. Please use the same machine at the same time of day to check your readings and record them to bring to your follow-up visit.    Follow-Up: Your physician recommends that you schedule a follow-up appointment after testing with Dr. Alvino Patel.  It was a pleasure seeing you today here in the office. Please do not hesitate to give us a call back if you have any further questions. [phone]  [place] CellarPamela A. RN, BSN    Echocardiogram An echocardiogram, or echocardiography, uses sound waves (ultrasound) to produce an image of your heart. The echocardiogram is simple, painless, obtained within a short period of time, and offers valuable information to your health care provider. The images from an echocardiogram can provide information such as:  Evidence of coronary artery disease (CAD).  Heart size.  Heart muscle function.  Heart valve function.  Aneurysm detection.  Evidence of a past heart attack.  Fluid buildup around the heart.  Heart muscle  thickening.  Assess heart valve function. Tell a health care provider about:  Any allergies you have.  All medicines you are taking, including vitamins, herbs, eye drops, creams, and over-the-counter medicines.  Any problems you or family members have had with anesthetic medicines.  Any blood disorders you have.  Any surgeries you have had.  Any medical conditions you have.  Whether you are pregnant or may be pregnant. What happens before the procedure? No special preparation is needed. Eat and drink normally. What happens during the procedure?  In order to produce an image of your heart, gel will be applied to your chest and a wand-like tool (transducer) will be moved over your chest. The gel will help transmit the sound waves from the transducer. The sound waves will harmlessly bounce off your heart to allow the heart images to be captured in real-time motion. These images will then be recorded.  You may need an IV to receive a medicine that improves the quality of the pictures. What happens after the procedure? You may return to your normal schedule including diet, activities, and medicines, unless your health care provider tells you otherwise. This information is not intended to replace advice given to you by your health care provider. Make sure you discuss any questions you have with your health care provider. Document Released: 06/07/2000 Document Revised: 01/27/2016 Document Reviewed: 02/15/2013 Elsevier Interactive Patient Education  2017 Elsevier Inc.  Holter Monitoring Introduction A Holter monitor is a small device that is used to detect abnormal heart rhythms. It clips to your clothing and is connected by wires to flat, sticky disks (electrodes) that attach to your chest. It is worn  continuously for 24-48 hours. Follow these instructions at home:  Wear your Holter monitor at all times, even while exercising and sleeping, for as long as directed by your health care  provider.  Make sure that the Holter monitor is safely clipped to your clothing or close to your body as recommended by your health care provider.  Do not get the monitor or wires wet.  Do not put body lotion or moisturizer on your chest.  Keep your skin clean.  Keep a diary of your daily activities, such as walking and doing chores. If you feel that your heartbeat is abnormal or that your heart is fluttering or skipping a beat:  Record what you are doing when it happens.  Record what time of day the symptoms occur.  Return your Holter monitor as directed by your health care provider.  Keep all follow-up visits as directed by your health care provider. This is important. Get help right away if:  You feel lightheaded or you faint.  You have trouble breathing.  You feel pain in your chest, upper arm, or jaw.  You feel sick to your stomach and your skin is pale, cool, or damp.  You heartbeat feels unusual or abnormal. This information is not intended to replace advice given to you by your health care provider. Make sure you discuss any questions you have with your health care provider. Document Released: 03/08/2004 Document Revised: 11/16/2015 Document Reviewed: 01/17/2014  2017 Elsevier  How to Take Your Blood Pressure HOW DO I GET A BLOOD PRESSURE MACHINE?  You can buy an electronic home blood pressure machine at your local pharmacy. Insurance will sometimes cover the cost if you have a prescription.  Ask your doctor what type of machine is best for you. There are different machines for your arm and your wrist.  If you decide to buy a machine to check your blood pressure on your arm, first check the size of your arm so you can buy the right size cuff. To check the size of your arm:   Use a measuring tape that shows both inches and centimeters.   Wrap the measuring tape around the upper-middle part of your arm. You may need someone to help you measure.   Write down  your arm measurement in both inches and centimeters.   To measure your blood pressure correctly, it is important to have the right size cuff.   If your arm is up to 13 inches (up to 34 centimeters), get an adult cuff size.  If your arm is 13 to 17 inches (35 to 44 centimeters), get a large adult cuff size.    If your arm is 17 to 20 inches (45 to 52 centimeters), get an adult thigh cuff.  WHAT DO THE NUMBERS MEAN?   There are two numbers that make up your blood pressure. For example: 120/80.  The first number (120 in our example) is called the "systolic pressure." It is a measure of the pressure in your blood vessels when your heart is pumping blood.  The second number (80 in our example) is called the "diastolic pressure." It is a measure of the pressure in your blood vessels when your heart is resting between beats.  Your doctor will tell you what your blood pressure should be. WHAT SHOULD I DO BEFORE I CHECK MY BLOOD PRESSURE?   Try to rest or relax for at least 30 minutes before you check your blood pressure.  Do not smoke.  Do  not have any drinks with caffeine, such as:  Soda.  Coffee.  Tea.  Check your blood pressure in a quiet room.  Sit down and stretch out your arm on a table. Keep your arm at about the level of your heart. Let your arm relax.  Make sure that your legs are not crossed. HOW DO I CHECK MY BLOOD PRESSURE?  Follow the directions that came with your machine.  Make sure you remove any tight-fitting clothing from your arm or wrist. Wrap the cuff around your upper arm or wrist. You should be able to fit a finger between the cuff and your arm. If you cannot fit a finger between the cuff and your arm, it is too tight and should be removed and rewrapped.  Some units require you to manually pump up the arm cuff.  Automatic units inflate the cuff when you press a button.  Cuff deflation is automatic in both models.  After the cuff is inflated, the  unit measures your blood pressure and pulse. The readings are shown on a monitor. Hold still and breathe normally while the cuff is inflated.  Getting a reading takes less than a minute.  Some models store readings in a memory. Some provide a printout of readings. If your machine does not store your readings, keep a written record.  Take readings with you to your next visit with your doctor. This information is not intended to replace advice given to you by your health care provider. Make sure you discuss any questions you have with your health care provider. Document Released: 05/23/2008 Document Revised: 07/01/2014 Document Reviewed: 08/05/2013 Elsevier Interactive Patient Education  2017 Elsevier Inc. Introduction Your blood pressure on this visit to the emergency department or clinic is elevated. This does not necessarily mean you have high blood pressure (hypertension), but it does mean that your blood pressure needs to be rechecked. Many times your blood pressure can increase due to illness, pain, anxiety, or other factors. We recommend that you get a series of blood pressure readings done over a period of 5 days. It is best to get a reading in the morning and one in the evening. You should make sure to sit and relax for 1-5 minutes before the reading is taken. Write the readings down and make a follow-up appointment with your health care provider to discuss the results. If there is not a free clinic or a drug store with a blood-pressure-taking machine near you, you can purchase blood-pressure-taking equipment from a drug store. Having one in the home allows you the convenience of taking your blood pressure while you are home and relaxed. BLOOD PRESSURE LOG  Date: _______________________  a.m. _____________________  p.m. _____________________ Date: _______________________  a.m. _____________________  p.m. _____________________ Date: _______________________  a.m.  _____________________  p.m. _____________________ Date: _______________________  a.m. _____________________  p.m. _____________________ Date: _______________________  a.m. _____________________  p.m. _____________________ This information is not intended to replace advice given to you by your health care provider. Make sure you discuss any questions you have with your health care provider. Document Released: 03/09/2003 Document Revised: 12/29/2015 Document Reviewed: 08/03/2013  2017 Elsevier

## 2016-07-18 ENCOUNTER — Ambulatory Visit (INDEPENDENT_AMBULATORY_CARE_PROVIDER_SITE_OTHER): Payer: Medicare Other

## 2016-07-18 ENCOUNTER — Other Ambulatory Visit: Payer: Self-pay

## 2016-07-18 ENCOUNTER — Other Ambulatory Visit: Payer: Medicare Other

## 2016-07-18 ENCOUNTER — Ambulatory Visit: Payer: Medicare Other | Admitting: Cardiology

## 2016-07-18 ENCOUNTER — Telehealth: Payer: Self-pay | Admitting: Cardiology

## 2016-07-18 DIAGNOSIS — R002 Palpitations: Secondary | ICD-10-CM | POA: Diagnosis not present

## 2016-07-18 NOTE — Telephone Encounter (Signed)
We can discuss this on ffup

## 2016-07-18 NOTE — Telephone Encounter (Signed)
Let patient know that Dr. Alvino ChapelIngal has reviewed these numbers and to continue monitor and we will review at her follow up. She verbalized understanding and had no further questions at this time.

## 2016-07-18 NOTE — Telephone Encounter (Signed)
Pt came by today and dropped off her BP readings  07/05/16 : 144/69 HR 71  07/06/16:  140/59 hr 75  07/07/16  149/69 hr 66 07/08/16 146/62 hr 83 07/09/16 150/69 HR 73 07/10/16 142/70 HR 73 07/11/16 142/73 HR  82 07/12/16 139/66 HR 76 07/13/16 129/64 HR 71 07/14/16 133/59 HR 71 07/15/16 153/70 HR 85 07/16/16 157/70 HR 72 07/17/16 145/59 HR 80   Was asked to call back if we had any concerns

## 2016-07-25 ENCOUNTER — Ambulatory Visit: Payer: Medicare Other | Admitting: Cardiology

## 2016-08-01 ENCOUNTER — Ambulatory Visit: Payer: Medicare Other | Admitting: Cardiology

## 2016-08-15 ENCOUNTER — Ambulatory Visit: Payer: Medicare Other

## 2016-08-15 ENCOUNTER — Ambulatory Visit (INDEPENDENT_AMBULATORY_CARE_PROVIDER_SITE_OTHER): Payer: Medicare Other

## 2016-08-15 DIAGNOSIS — R002 Palpitations: Secondary | ICD-10-CM

## 2016-08-21 ENCOUNTER — Ambulatory Visit
Admission: RE | Admit: 2016-08-21 | Discharge: 2016-08-21 | Disposition: A | Discharge status: Home / Self Care | Payer: Medicare Other | Source: Ambulatory Visit | Attending: Cardiology | Admitting: Cardiology

## 2016-08-21 DIAGNOSIS — R002 Palpitations: Secondary | ICD-10-CM | POA: Insufficient documentation

## 2016-08-22 ENCOUNTER — Ambulatory Visit (INDEPENDENT_AMBULATORY_CARE_PROVIDER_SITE_OTHER): Payer: Medicare Other | Admitting: Cardiology

## 2016-08-22 ENCOUNTER — Encounter: Payer: Self-pay | Admitting: Cardiology

## 2016-08-22 VITALS — BP 154/64 | HR 88 | Ht 66.0 in | Wt 167.5 lb

## 2016-08-22 DIAGNOSIS — I493 Ventricular premature depolarization: Secondary | ICD-10-CM | POA: Diagnosis not present

## 2016-08-22 DIAGNOSIS — I10 Essential (primary) hypertension: Secondary | ICD-10-CM | POA: Diagnosis not present

## 2016-08-22 DIAGNOSIS — I491 Atrial premature depolarization: Secondary | ICD-10-CM

## 2016-08-22 DIAGNOSIS — R002 Palpitations: Secondary | ICD-10-CM

## 2016-08-22 DIAGNOSIS — I447 Left bundle-branch block, unspecified: Secondary | ICD-10-CM | POA: Diagnosis not present

## 2016-08-22 NOTE — Patient Instructions (Signed)
Medication Instructions:  No changes  Labwork: None today  Testing/Procedures: None today  Follow-Up: Your physician recommends that you schedule a follow-up appointment as needed.   It was a pleasure seeing you today here in the office. Please do not hesitate to give us a call back if you have any further questions. [phone]  Gary CellarPamela A. RN, BSN

## 2016-08-22 NOTE — Progress Notes (Signed)
Cardiology Office Note   Date:  08/22/2016   ID:  Laurie Patel, DOB 1928/02/02, MRN [id number]  Referring Doctor:  Marikay Alar, MD   Cardiologist:   Almond Lint, MD   Reason for consultation:  Chief Complaint  Patient presents with  . other    Follow up from holter monitor and Echo. "doing well." Meds reviewed by the pt. verbally.       History of Present Illness: Laurie Patel is a 83 y.o. female who presents for Follow-up after testing  Since last visit, patient continues to do okay. She continues to have the palpitations which she has known all her life. She is not really bothered by these.   She denies shortness of breath, chest pain, PND, orthopnea, edema. She tries to stick to a heart healthy diet and tries to stay physically active.  Patient denies PND, orthopnea, edema.  ROS:  Please see the history of present illness. Aside from mentioned under HPI, all other systems are reviewed and negative.    Past Medical History:  Diagnosis Date  . Heart murmur     Past Surgical History:  Procedure Laterality Date  . APPENDECTOMY  1986  . EYE SURGERY Bilateral 1995 and 1999   Lens implants  . WRIST SURGERY Left 1986   ORIF      reports that she has never smoked. She has never used smokeless tobacco. She reports that she does not drink alcohol or use drugs.   family history includes COPD (age of onset: 33) in her father; Cancer (age of onset: 75) in her mother; Early death in her paternal grandmother; Heart disease in her maternal grandfather, maternal grandmother, and paternal grandfather; Heart disease (age of onset: 7) in her brother; Heart disease (age of onset: 50) in her father; Obesity in her paternal grandfather.   Outpatient Medications Prior to Visit  Medication Sig Dispense Refill  . aspirin 81 MG EC tablet Take 81 mg by mouth daily. Swallow whole.    . fexofenadine (ALLEGRA) 180 MG tablet Take 180 mg by mouth daily.    . Multiple  Vitamins-Calcium (ONE-A-DAY WOMENS PO) Take 1 tablet by mouth daily.    . Omega 3 1200 MG CAPS Take 1,200 mg by mouth 1 day or 1 dose.     Marland Kitchen lisinopril (PRINIVIL,ZESTRIL) 5 MG tablet Take 1 tablet (5 mg total) by mouth daily. (Patient not taking: Reported on 08/22/2016) 90 tablet 3   No facility-administered medications prior to visit.      Allergies: Patient has no known allergies.    PHYSICAL EXAM: VS:  BP (!) 154/64 (BP Location: Left Arm, Patient Position: Sitting, Cuff Size: Normal)   Pulse 88   Ht 5\' 6"  (1.676 m)   Wt 167 lb 8 oz (76 kg)   BMI 27.04 kg/m  , Body mass index is 27.04 kg/m. Wt Readings from Last 3 Encounters:  08/22/16 167 lb 8 oz (76 kg)  06/25/16 169 lb 8 oz (76.9 kg)  06/20/16 172 lb 6.4 oz (78.2 kg)    GENERAL:  well developed, well nourished, not in acute distress HEENT: normocephalic, pink conjunctivae, anicteric sclerae, no xanthelasma, normal dentition, oropharynx clear NECK:  no neck vein engorgement, JVP normal, no hepatojugular reflux, carotid upstroke brisk and symmetric, no bruit, no thyromegaly, no lymphadenopathy LUNGS:  good respiratory effort, clear to auscultation bilaterally CV:  PMI not displaced, no thrills, no lifts, S1 and S2 within normal limits, no palpable S3 or S4, no  murmurs, no rubs, no gallops ABD:  Soft, nontender, nondistended, normoactive bowel sounds, no abdominal aortic bruit, no hepatomegaly, no splenomegaly MS: nontender back, no kyphosis, no scoliosis, no joint deformities EXT:  2+ DP/PT pulses, no edema, no varicosities, no cyanosis, no clubbing SKIN: warm, nondiaphoretic, normal turgor, no ulcers NEUROPSYCH: alert, oriented to person, place, and time, sensory/motor grossly intact, normal mood, appropriate affect    Recent Labs: 06/20/2016: ALT 14; BUN 10; Creatinine, Ser 0.75; Hemoglobin 14.9; Platelets 225.0; Potassium 4.3; Sodium 140; TSH 2.81   Lipid Panel    Component Value Date/Time   CHOL 209 (H) 03/17/2015 1056    TRIG 216.0 (H) 03/17/2015 1056   HDL 31.10 (L) 03/17/2015 1056   CHOLHDL 7 03/17/2015 1056   VLDL 43.2 (H) 03/17/2015 1056   LDLDIRECT 147.0 03/21/2016 0926     Other studies Reviewed:  EKG:  The ekg from 06/20/2016 was personally reviewed by me and it revealed sinus rhythm, PVCs, left bundle branch block.  Additional studies/ records that were reviewed personally reviewed by me today include:  Echo 07/18/2016: Left ventricle: Wall thickness was increased in a pattern of   moderate LVH. Systolic function was normal. The estimated   ejection fraction was in the range of 60% to 65%. Wall motion was   normal; there were no regional wall motion abnormalities. Left   ventricular diastolic function parameters were normal for the   patient&'s age. - Mitral valve: There was mild regurgitation. - Pulmonary arteries: PA peak pressure: 34 mm Hg (S).  Holter monitor 08/15/2016: Overall rhythm is sinus with left bundle branch block. Heart rate ranged from 54-117 bpm, average of 77 BPM.  No high-grade ventricular ectopy: 59 isolated PVCs. Upon careful review of rhythm strips, no evidence of ventricular runs.  Supraventricular ectopy ( < 1%) consisted of the following: 541 isolated PACs. 12 atrial couplets. 12 atrial bigeminy. 3 atrial runs --- the longest was 7 beats at 129 BPM. The fastest beat was 4 beats at 170 BPM. These were likely paroxysmal atrial tachycardia.   ASSESSMENT AND PLAN:  Palpitations Rare PVCs Few PACs short runs of likely PAT Clinically, she is not symptomatic from these LVEF is normal. Recommend to consider beta blockers next if her blood pressure requires more antihypertensive medications. Patient verbalized understanding   Left bundle-branch block - noted on previous EKGs Echo reveals normal LVEF. As per her last visit, 06/25/2016: [name]  Hypertension Blood pressure much better at home. Continue monitoring. As mentioned above, may use beta blockers next if blood pressure requires more medications. Sodium restriction. I started changes recommended.  Current medicines are reviewed at length with the patient today.  The patient does not have concerns regarding medicines.  Labs/ tests ordered today include:  No orders of the defined types were placed in this encounter.   I had a lengthy and detailed discussion with the patient regarding diagnoses, prognosis, diagnostic options, treatment options , and side effects of medications.   I counseled the patient on importance of lifestyle modification including heart healthy diet, regular physical activity .   Disposition:   FU with undersigned prn  I spent at least 25 minutes with the patient today and more than 50% of the time was spent counseling the patient and coordinating care.    Signed, Almond LintAileen Jasmain Ahlberg, MD  08/22/2016 2:59 PM  [place]  This note was generated in part with voice recognition software and I apologize for any typographical errors that were not detected and corrected.

## 2016-09-18 ENCOUNTER — Ambulatory Visit: Payer: Medicare Other | Admitting: Family Medicine

## 2016-09-19 ENCOUNTER — Ambulatory Visit (INDEPENDENT_AMBULATORY_CARE_PROVIDER_SITE_OTHER): Payer: Medicare Other | Admitting: Family Medicine

## 2016-09-19 ENCOUNTER — Encounter: Payer: Self-pay | Admitting: Family Medicine

## 2016-09-19 VITALS — BP 138/60 | HR 83 | Temp 98.3°F | Wt 170.6 lb

## 2016-09-19 DIAGNOSIS — R002 Palpitations: Secondary | ICD-10-CM

## 2016-09-19 DIAGNOSIS — J309 Allergic rhinitis, unspecified: Secondary | ICD-10-CM

## 2016-09-19 DIAGNOSIS — I10 Essential (primary) hypertension: Secondary | ICD-10-CM

## 2016-09-19 DIAGNOSIS — E119 Type 2 diabetes mellitus without complications: Secondary | ICD-10-CM

## 2016-09-19 LAB — POCT GLYCOSYLATED HEMOGLOBIN (HGB A1C): HEMOGLOBIN A1C: 7.1

## 2016-09-19 LAB — HM DIABETES EYE EXAM

## 2016-09-19 NOTE — Assessment & Plan Note (Signed)
Has worked extensively on diet. She is going to start exercising again. Encouraged her to continue these things.

## 2016-09-19 NOTE — Assessment & Plan Note (Signed)
Continue Allegra. Monitor symptoms. Advised if lightheadedness becomes an issue again she should let us know. Sounds as though this is orthostatic hypotension given that it only occurs when she rises up.

## 2016-09-19 NOTE — Assessment & Plan Note (Signed)
At goal. Continue current medications. 

## 2016-09-19 NOTE — Patient Instructions (Signed)
Nice to see you. Please continue to work on diet and exercise.  Please monitor for recurrence of your lightheadedness. If you develop persistent palpitations or lightheadedness please seek medical attention.

## 2016-09-19 NOTE — Progress Notes (Signed)
  Laurie AlarEric Genae Strine, MD Phone: [phone]  Laurie Patel is a 83 y.o. female who presents today for f/u.  HYPERTENSION  Disease Monitoring  Chest pain- no    Dyspnea- no Medications  Compliance-  Taking lisinopril. Lightheadedness-  Rare if takes allegra during the day and rises too quickly, this has improved with switching allegra to night time  Edema- no  Diabetes: Not on medications. Does not check sugars. She's changed her diet quite a bit with getting more protein in the morning and at lunch. She is going to start exercising again. She mows her own lawn. She sees an ophthalmologist later today. No polyuria or polydipsia.  Allergic rhinitis: This has been stable. She stopped taking the Allegra for a period of time and her lightheadedness went away. Her rhinorrhea came back when she stopped this so she started taking it at night and the lightheadedness has not been an issue. Rhinorrhea is better.  She saw cardiology for palpitations. Workup revealed some PACs and PVCs. She does have left bundle branch block that is chronic. She notes palpitations are improved to some degree with her diet changes.  PMH: nonsmoker.   ROS see history of present illness  Objective  Physical Exam Vitals:   09/19/16 0957  BP: 138/60  Pulse: 83  Temp: 98.3 F (36.8 C)    BP Readings from Last 3 Encounters:  09/19/16 138/60  08/22/16 (!) 154/64  06/25/16 (!) 160/60   Wt Readings from Last 3 Encounters:  09/19/16 170 lb 9.6 oz (77.4 kg)  08/22/16 167 lb 8 oz (76 kg)  06/25/16 169 lb 8 oz (76.9 kg)    Physical Exam  Constitutional: She is well-developed, well-nourished, and in no distress.  HENT:  Head: Normocephalic and atraumatic.  Mouth/Throat: Oropharynx is clear and moist. No oropharyngeal exudate.  Happel: Conjunctivae are normal. Pupils are equal, round, and reactive to light.  Cardiovascular: Normal rate, regular rhythm and normal heart sounds.   Pulmonary/Chest: Effort normal and  breath sounds normal.  Musculoskeletal: She exhibits no edema.  Neurological: She is alert. Gait normal.     Assessment/Plan: Please see individual problem list.  HTN (hypertension) At goal. Continue current medications.  Allergic rhinitis Continue Allegra. Monitor symptoms. Advised if lightheadedness becomes an issue again she should let us know. Sounds as though this is orthostatic hypotension given that it only occurs when she rises up.  Palpitations Somewhat improved. Has been evaluated by cardiology. Continue to monitor.  Diabetes (HCC) Has worked extensively on diet. She is going to start exercising again. Encouraged her to continue these things.   Orders Placed This Encounter  Procedures  . POCT HgB A1C    Laurie AlarEric Venancio Chenier, MD [facility]

## 2016-09-19 NOTE — Progress Notes (Signed)
Pre visit review using our clinic review tool, if applicable. No additional management support is needed unless otherwise documented below in the visit note. 

## 2016-09-19 NOTE — Assessment & Plan Note (Signed)
Somewhat improved. Has been evaluated by cardiology. Continue to monitor.

## 2016-09-23 ENCOUNTER — Encounter: Payer: Self-pay | Admitting: Family Medicine

## 2016-10-02 ENCOUNTER — Telehealth: Payer: Self-pay | Admitting: Cardiology

## 2016-10-02 NOTE — Telephone Encounter (Addendum)
We requested stress test that was done with Dr. Juliann Pares and a thorough search of files revealed no documents or results for this patient. Form placed in "Save" bin on Lowell Mcgurk's desk.

## 2016-10-02 NOTE — Telephone Encounter (Signed)
Spoke with patient and let her know that Dr. Glennis Brink office was unable to find any stress test results. She stated that didn't surprise her since it was such a long time ago. Let her know that I spoke with Dr. Alvino Chapel about this and her recommendations are to have a lexiscan. She declined at this time and states that she is just not sure about doing that. Instructed her to call back if she should change her mind and I would be happy to assist her with scheduling. She verbalized understanding of our conversation and had no further questions at this time.

## 2016-10-02 NOTE — Telephone Encounter (Signed)
If that is case, rec lexiscan stress test, pt has LBBB. Thank you

## 2016-10-29 ENCOUNTER — Encounter: Payer: Self-pay | Admitting: Family Medicine

## 2016-10-29 ENCOUNTER — Ambulatory Visit (INDEPENDENT_AMBULATORY_CARE_PROVIDER_SITE_OTHER): Payer: Medicare Other | Admitting: Family Medicine

## 2016-10-29 VITALS — BP 150/62 | HR 87 | Temp 98.6°F | Wt 168.5 lb

## 2016-10-29 DIAGNOSIS — H9191 Unspecified hearing loss, right ear: Secondary | ICD-10-CM | POA: Diagnosis not present

## 2016-10-29 DIAGNOSIS — H9221 Otorrhagia, right ear: Secondary | ICD-10-CM | POA: Diagnosis not present

## 2016-10-29 NOTE — Assessment & Plan Note (Signed)
New problem. Erythema noted in the canal. I cannot appreciate a perforation. Given associated hearing loss, sending urgently to ENT.

## 2016-10-29 NOTE — Progress Notes (Signed)
Subjective:  Patient ID: Laurie Patel, female    DOB: [birth year]  Age: 83 y.o. MRN: [id number]  CC: Hearing loss, blood from ear  HPI:  83 year old female presents with the above complaints.  Patient states that she's had some bleeding from the right ear and hearing loss since Friday. Some associated pain. She states it was worse particularly on Sunday. She's had minimal bleeding particularly with wiping the ear. No visible dripping from the ear. She's had no associated fever or upper respiratory symptoms. No recent swimming or underwater activities. No known exacerbating or relieving factors. Bleeding has resolved. She now feels like her left ear is beginning to get affected as well. She has no other complaints or concerns at this time.  Social Hx   Social History   Social History  . Marital status: Married    Spouse name: N/A  . Number of children: N/A  . Years of education: N/A   Social History Main Topics  . Smoking status: Never Smoker  . Smokeless tobacco: Never Used  . Alcohol use No  . Drug use: No  . Sexual activity: No   Other Topics Concern  . None   Social History Narrative   [place]    Owns a [place]    1 dog    Review of Systems  Constitutional: Negative.   HENT: Positive for ear pain and hearing loss.    Objective:  BP (!) 150/62   Pulse 87   Temp 98.6 F (37 C) (Oral)   Wt 168 lb 8 oz (76.4 kg)   SpO2 92%   BMI 27.20 kg/m   BP/Weight 10/29/2016 09/19/2016 08/22/2016  Systolic BP 150 138 154  Diastolic BP 62 60 64  Wt. (Lbs) 168.5 170.6 167.5  BMI 27.2 27.54 27.04   Physical Exam  Constitutional: She is oriented to person, place, and time. She appears well-developed. No distress.  HENT:  Head: Normocephalic and atraumatic.  Right ear canal with erythema. Visualized portion of the TM appears normal. Left TM with scattered areas of erythema. Normal-appearing TM.  Pulmonary/Chest: Effort normal.  Neurological: She is alert  and oriented to person, place, and time.  Psychiatric: She has a normal mood and affect.  Vitals reviewed.   Lab Results  Component Value Date   WBC 9.3 06/20/2016   HGB 14.9 06/20/2016   HCT 43.5 06/20/2016   PLT 225.0 06/20/2016   GLUCOSE 177 (H) 06/20/2016   CHOL 209 (H) 03/17/2015   TRIG 216.0 (H) 03/17/2015   HDL 31.10 (L) 03/17/2015   LDLDIRECT 147.0 03/21/2016   ALT 14 06/20/2016   AST 16 06/20/2016   NA 140 06/20/2016   K 4.3 06/20/2016   CL 101 06/20/2016   CREATININE 0.75 06/20/2016   BUN 10 06/20/2016   CO2 34 (H) 06/20/2016   TSH 2.81 06/20/2016   HGBA1C 7.1 09/19/2016    Assessment & Plan:   Problem List Items Addressed This Visit    Otorrhagia of right ear - Patel    New problem. Erythema noted in the canal. I cannot appreciate a perforation. Given associated hearing loss, sending urgently to ENT.      Relevant Orders   Ambulatory referral to ENT    Other Visit Diagnoses    Hearing loss of right ear, unspecified hearing loss type       Relevant Orders   Ambulatory referral to ENT      Follow-up: PRN  Laurie OtherJayce Mahogony Gilchrest DO Laurie Patel  Laurie Patel

## 2016-10-29 NOTE — Progress Notes (Signed)
Pre visit review using our clinic review tool, if applicable. No additional management support is needed unless otherwise documented below in the visit note. 

## 2016-10-29 NOTE — Patient Instructions (Addendum)
We will call with the referral asap.  Take care  Dr. Adriana Simasook

## 2016-10-30 ENCOUNTER — Telehealth: Payer: Self-pay | Admitting: Family Medicine

## 2016-10-30 NOTE — Telephone Encounter (Signed)
Pt called wanting to speak with you in regards to the care that she received yesterday. Pt came in with pressure in her head, ear ache and  Ear bleeding. Pt feels as though she was brushed off. They discussed about having her see an ENT for further evaluation, as he did not see anything. She asked Dr. Cook if an anAdriana Simastibiotic would help and told her no and started closing the door and said that we would have her out shortly. She wants to make it clear that she was not just coming in for pills. She stated that she had a legitimate problem and it was not take care of. Please advise, thank you!  Call pt @ [phone]

## 2016-10-30 NOTE — Telephone Encounter (Signed)
Patient requested call to discuss yesterday's visit.  I advised Dr. Adriana Simasook has placed a referral to an ENT, and I will ask Melissa to give this priority, and will call her on Friday 5/11.  She stated she can't see a doctor tomorrow, and Tuesday of next week will be the best day.  However, she is getting better, ear is not sore to touch, but she is still having some balance concerns and can't hear clearly.  She advised she did not come to the appointment yesterday seeking drugs.  At end of call, she confirmed ok to make the appointment with ENT for next week, and if she gets worse before then, she will call our office, and/or make an appointment with Dr. Birdie SonsSonnenberg if can't get in to the ENT and still having issues.

## 2016-10-31 ENCOUNTER — Ambulatory Visit (INDEPENDENT_AMBULATORY_CARE_PROVIDER_SITE_OTHER): Payer: Medicare Other | Admitting: Podiatry

## 2016-10-31 ENCOUNTER — Encounter: Payer: Self-pay | Admitting: Podiatry

## 2016-10-31 VITALS — BP 189/80 | HR 83

## 2016-10-31 DIAGNOSIS — E119 Type 2 diabetes mellitus without complications: Secondary | ICD-10-CM

## 2016-10-31 DIAGNOSIS — B351 Tinea unguium: Secondary | ICD-10-CM

## 2016-10-31 DIAGNOSIS — M722 Plantar fascial fibromatosis: Secondary | ICD-10-CM

## 2016-10-31 DIAGNOSIS — M79676 Pain in unspecified toe(s): Secondary | ICD-10-CM | POA: Diagnosis not present

## 2016-10-31 NOTE — Telephone Encounter (Signed)
Dr. Adriana Simasook did place an urgent referral to ENT. It was sent to [facility] which they determine if it is truly urgent. They had called yesterday asking about her hearing loss, for which Dr. Adriana Simasook had already left (since it was his half day) so I sent him a message. He responded after I have left for the day. They do not open till 8:30. They said that they can get her in earlier depending on her hearing loss. Since she wants to wait till next Tuesday I will let them know when they open.

## 2016-10-31 NOTE — Progress Notes (Addendum)
   Subjective:    Patient ID: Laurie Patel, female    DOB: 11-25-1927, 83 y.o.   MRN: [id number]  HPI this patient presents the office with chief complaint of a painful fifth toenail right foot which she says she is unable to treat. She says the nail has become thick and long and painful walking and wearing her shoes. This patient is diabetic. She says she is diet controlled. She also says that she had a pair of hard orthotics years ago which cracked and she has not found a good pair of insoles. since that time.  She also recommends that I would provide her with a good pair of orthoses.    Review of Systems  All other systems reviewed and are negative.      Objective:   Physical Exam GENERAL APPEARANCE: Alert, conversant. Appropriately groomed. No acute distress.  VASCULAR: Pedal pulses are  palpable at  [facility] bilateral.  Capillary refill time is immediate to all digits,  Normal temperature gradient.   NEUROLOGIC: sensation is normal to 5.07 monofilament at 5/5 sites bilateral.  Light touch is intact bilateral, Muscle strength normal.  MUSCULOSKELETAL: acceptable muscle strength, tone and stability bilateral.  Intrinsic muscluature intact bilateral.  Rectus appearance of foot and digits noted bilateral.  Palpable pain along the course of plantar fascia.  DERMATOLOGIC: skin color, texture, and turgor are within normal limits.  No preulcerative lesions or ulcers  are seen, no interdigital maceration noted.  No open lesions present.   No drainage noted. NAILS  Thick disfigured discolored nails both feet.         Assessment & Plan:  Onychomycosis  Plantar fascitis  IE  Debridement of nails  Prescribe powersoles for her shoes.  RTC prn   Helane GuntherGregory Mayer DPM

## 2017-02-07 ENCOUNTER — Telehealth: Payer: Self-pay | Admitting: Family Medicine

## 2017-02-07 NOTE — Telephone Encounter (Signed)
Spoke to pt. She wanted to wait until her September appt to schedule the AWV appointment since we could not add it to her existing Sept appt (Denisa has a class). Pt states she will ask front office staff to schedule.

## 2017-03-05 ENCOUNTER — Emergency Department: Payer: Medicare Other

## 2017-03-05 ENCOUNTER — Encounter: Payer: Self-pay | Admitting: *Deleted

## 2017-03-05 ENCOUNTER — Emergency Department
Admission: EM | Admit: 2017-03-05 | Discharge: 2017-03-05 | Disposition: A | Discharge status: Home / Self Care | Payer: Medicare Other | Attending: Emergency Medicine | Admitting: Emergency Medicine

## 2017-03-05 DIAGNOSIS — I10 Essential (primary) hypertension: Secondary | ICD-10-CM | POA: Insufficient documentation

## 2017-03-05 DIAGNOSIS — M25561 Pain in right knee: Secondary | ICD-10-CM | POA: Diagnosis not present

## 2017-03-05 DIAGNOSIS — Z7982 Long term (current) use of aspirin: Secondary | ICD-10-CM | POA: Diagnosis not present

## 2017-03-05 DIAGNOSIS — Y92008 Other place in unspecified non-institutional (private) residence as the place of occurrence of the external cause: Secondary | ICD-10-CM | POA: Insufficient documentation

## 2017-03-05 DIAGNOSIS — Y9301 Activity, walking, marching and hiking: Secondary | ICD-10-CM | POA: Diagnosis not present

## 2017-03-05 DIAGNOSIS — Z79899 Other long term (current) drug therapy: Secondary | ICD-10-CM | POA: Insufficient documentation

## 2017-03-05 DIAGNOSIS — S82831A Other fracture of upper and lower end of right fibula, initial encounter for closed fracture: Secondary | ICD-10-CM | POA: Diagnosis not present

## 2017-03-05 DIAGNOSIS — E119 Type 2 diabetes mellitus without complications: Secondary | ICD-10-CM | POA: Insufficient documentation

## 2017-03-05 DIAGNOSIS — Y999 Unspecified external cause status: Secondary | ICD-10-CM | POA: Diagnosis not present

## 2017-03-05 DIAGNOSIS — W19XXXA Unspecified fall, initial encounter: Secondary | ICD-10-CM | POA: Diagnosis not present

## 2017-03-05 DIAGNOSIS — W010XXA Fall on same level from slipping, tripping and stumbling without subsequent striking against object, initial encounter: Secondary | ICD-10-CM | POA: Insufficient documentation

## 2017-03-05 DIAGNOSIS — M25571 Pain in right ankle and joints of right foot: Secondary | ICD-10-CM | POA: Diagnosis not present

## 2017-03-05 DIAGNOSIS — S8991XA Unspecified injury of right lower leg, initial encounter: Secondary | ICD-10-CM | POA: Diagnosis present

## 2017-03-05 MED ORDER — HYDROCODONE-ACETAMINOPHEN 5-325 MG PO TABS
1.0000 | ORAL_TABLET | Freq: Once | ORAL | Status: AC
  Administered 2017-03-05: 1 via ORAL
  Filled 2017-03-05: qty 1

## 2017-03-05 MED ORDER — HYDROCODONE-ACETAMINOPHEN 5-325 MG PO TABS: 1.0000 | ORAL_TABLET | ORAL | 0 refills | Status: DC | PRN

## 2017-03-05 NOTE — Discharge Instructions (Signed)
please call orthopedics to arrange a follow-up appointment within the next 1 week. Return to the emergent department for any increased pain or any other symptom personally concerning to yourself.

## 2017-03-05 NOTE — ED Provider Notes (Signed)
[facility] Emergency Department Provider Note  Time seen: 3:49 PM  I have reviewed the triage vital signs and the nursing notes.   HISTORY  Chief Complaint Knee Injury    HPI Laurie Patel is a 83 y.o. female who presents to the emergency department after a fall. According to the patient she was walking to her garden when she tripped over her dog landing on her right side. Patient had pain and swelling to her right knee and ankle. Denies head injury. Denies LOC. Denies any other injuries or complaints at this time.   Past Medical History:  Diagnosis Date  . Heart murmur     Patient Active Problem List   Diagnosis Date Noted  . Otorrhagia of right ear 10/29/2016  . Palpitations 06/21/2016  . Hyperlipidemia 03/21/2016  . Allergic rhinitis 03/21/2016  . Diabetes (HCC) 09/14/2015  . HTN (hypertension) 08/27/2014    Past Surgical History:  Procedure Laterality Date  . APPENDECTOMY  1986  . EYE SURGERY Bilateral 1995 and 1999   Lens implants  . WRIST SURGERY Left 1986   ORIF     Prior to Admission medications   Medication Sig Start Date End Date Taking? Authorizing Provider  aspirin 81 MG EC tablet Take 81 mg by mouth daily. Swallow whole.    [provider]  cholecalciferol (VITAMIN D) 1000 units tablet Take 1,000 Units by mouth daily.    [provider]  fexofenadine (ALLEGRA) 180 MG tablet Take 180 mg by mouth daily.    [provider]  lisinopril (PRINIVIL,ZESTRIL) 5 MG tablet Take 5 mg by mouth daily.  06/15/16   [provider]  Multiple Vitamins-Calcium (ONE-A-DAY WOMENS PO) Take 1 tablet by mouth daily.    [provider]  Omega 3 1200 MG CAPS Take 1,200 mg by mouth 1 day or 1 dose.     [provider]    No Known Allergies  Family History  Problem Relation Age of Onset  . Cancer Mother 9       Breast Cancer  . Heart disease Father 50       Congestive Heart Failure  . COPD  Father 49       Emphysema  . Heart disease Brother 32       massive heart attack  . Heart disease Maternal Grandmother   . Heart disease Maternal Grandfather   . Early death Paternal Grandmother        Early death unsure age  . Heart disease Paternal Grandfather   . Obesity Paternal Grandfather     Social History Social History  Substance Use Topics  . Smoking status: Never Smoker  . Smokeless tobacco: Never Used  . Alcohol use No    Review of Systems Constitutional: Negative for fever.negative for LOC. Negative for head trauma. Cardiovascular: Negative for chest pain. Respiratory: Negative for shortness of breath. Gastrointestinal: Negative for abdominal pain Musculoskeletal: right knee and ankle swelling and pain. Neurological: Negative for headache All other ROS negative  ____________________________________________   PHYSICAL EXAM:  VITAL SIGNS: ED Triage Vitals  Enc Vitals Group     BP 03/05/17 1515 (!) 196/76     Pulse Rate 03/05/17 1515 79     Resp 03/05/17 1515 20     Temp 03/05/17 1515 98.6 F (37 C)     Temp Source 03/05/17 1515 Oral     SpO2 03/05/17 1515 95 %     Weight 03/05/17 1516 161 lb (73 kg)  Height 03/05/17 1516 5\' 6"  (1.676 m)     Head Circumference --      Peak Flow --      Pain Score 03/05/17 1515 10     Pain Loc --      Pain Edu? --      Excl. in GC? --     Constitutional: Alert and oriented. Well appearing and in no distress. [place]: Normal exam ENT   Head: Normocephalic and atraumatic.   Mouth/Throat: Mucous membranes are moist. Cardiovascular: Normal rate, regular rhythm. Respiratory: Normal respiratory effort without tachypnea nor retractions. Breath sounds are clear Gastrointestinal: Soft and nontender. No distention.  Musculoskeletal: moderate right knee swelling with effusion noted. Mild tenderness but good range of motion in the knee. Moderate tenderness to lateral malleolus of right ankle with mild pain with range  of motion. Neurovascular intact distally with 2+ DP pulse. Neurologic:  Normal speech and language. No gross focal neurologic deficits Skin:  Skin is warm, dry and intact.  Psychiatric: Mood and affect are normal.   ____________________________________________     RADIOLOGY   IMPRESSION: Mild to moderate degenerative joint disease. Moderate suprapatellar joint effusion is noted. No fracture or dislocation is noted.   IMPRESSION: Probable nondisplaced fracture seen involving the distal fibula with overlying soft tissue swelling.  ____________________________________________   INITIAL IMPRESSION / ASSESSMENT AND PLAN / ED COURSE  Pertinent labs & imaging results that were available during my care of the patient were reviewed by me and considered in my medical decision making (see chart for details).  x-ray consistent with nondisplaced fracture through the distal fibula. We will place in a posterior splint and discharge the patient and crutches. Patient will follow-up with orthopedics. Patient agreeable to this plan.  ____________________________________________   FINAL CLINICAL IMPRESSION(S) / ED DIAGNOSES  distal fibula fracture.    Minna AntisPaduchowski, Kinsly Hild, MD 03/05/17 1728

## 2017-03-05 NOTE — ED Triage Notes (Signed)
Pt brought in via ems from home.   Pt has right knee pain.  States dogs knocked her down on the wet grass    Swelling to right knee

## 2017-03-05 NOTE — ED Notes (Signed)
Pt has swelling to right knee.  Pt was knocked down by the dogs.   Pt fell on wet grass   No loc   No back or neck pain.  Pt alert.  Family with pt

## 2017-03-06 ENCOUNTER — Telehealth: Payer: Self-pay | Admitting: Family Medicine

## 2017-03-06 ENCOUNTER — Other Ambulatory Visit: Payer: Self-pay

## 2017-03-06 DIAGNOSIS — Z7401 Bed confinement status: Secondary | ICD-10-CM | POA: Diagnosis not present

## 2017-03-06 DIAGNOSIS — M25561 Pain in right knee: Secondary | ICD-10-CM | POA: Diagnosis not present

## 2017-03-06 DIAGNOSIS — S82821A Torus fracture of lower end of right fibula, initial encounter for closed fracture: Secondary | ICD-10-CM | POA: Diagnosis not present

## 2017-03-06 DIAGNOSIS — S82839A Other fracture of upper and lower end of unspecified fibula, initial encounter for closed fracture: Secondary | ICD-10-CM | POA: Insufficient documentation

## 2017-03-06 NOTE — Telephone Encounter (Signed)
Noted  

## 2017-03-06 NOTE — Patient Outreach (Signed)
Outreach patient after ED visit on 03/05/17.  Patient fell on yesterday and had to go to the ED.  She could not contact her PCP.  Patient verified PCP and stated she already had a appointment scheduled.  I explained to [facility] and 24 Hour Nurse Advice Line.  I told her I will mail information about [facility] out today.  I asked if she would like a follow up call with one of my team members and if so I had a few questions to ask.  She said no she is fine.

## 2017-03-06 NOTE — Telephone Encounter (Signed)
FYI - Pt called and stated that she fell last night and went to the ED. Pt states that she has a broken rt ankle and a bruised rt knee. Pt is going to ortho today.

## 2017-03-06 NOTE — Telephone Encounter (Signed)
fyi

## 2017-03-07 DIAGNOSIS — S82821A Torus fracture of lower end of right fibula, initial encounter for closed fracture: Secondary | ICD-10-CM | POA: Diagnosis not present

## 2017-03-10 ENCOUNTER — Telehealth: Payer: Self-pay | Admitting: *Deleted

## 2017-03-10 ENCOUNTER — Other Ambulatory Visit: Payer: Self-pay | Admitting: Family Medicine

## 2017-03-10 DIAGNOSIS — I10 Essential (primary) hypertension: Secondary | ICD-10-CM

## 2017-03-10 NOTE — Telephone Encounter (Signed)
Sent to pharmacy. Patient needs lab work in the next several weeks. Order placed. Please get her set up for an appointment.

## 2017-03-10 NOTE — Telephone Encounter (Signed)
Pt has a broken right ankle , pt would like to speak with Dr. Birdie Sons about her condition  Pt contact [phone]

## 2017-03-10 NOTE — Telephone Encounter (Signed)
Last OV 09/19/16 filed under historical

## 2017-03-10 NOTE — Telephone Encounter (Signed)
She probably would benefit from PT though I would have her discuss with her orthopedist as I have not seen her for this issue and would not be able to place an order for home health PT. It also appears that she has seen her orthopedist since she sustained this injury and they probably have a better idea of what is going on with this injury. I am happy to see her in the office to follow-up if she would like.

## 2017-03-10 NOTE — Telephone Encounter (Signed)
Patient states she fell last Wednesday and broke her ankle, she states she is unable to get herself out of bed and she would like to know if she needs a physical therapist to come to her house or if she would benefit from going to a rehab center to help her get back to caring for herself due to ankle. Patient will contact her orthopedist to speak to him aswell

## 2017-03-10 NOTE — Telephone Encounter (Signed)
Patient will call orthopedic

## 2017-03-11 DIAGNOSIS — S838X1A Sprain of other specified parts of right knee, initial encounter: Secondary | ICD-10-CM | POA: Diagnosis not present

## 2017-03-11 DIAGNOSIS — M25571 Pain in right ankle and joints of right foot: Secondary | ICD-10-CM | POA: Diagnosis not present

## 2017-03-11 DIAGNOSIS — S8390XA Sprain of unspecified site of unspecified knee, initial encounter: Secondary | ICD-10-CM | POA: Insufficient documentation

## 2017-03-11 DIAGNOSIS — S82821A Torus fracture of lower end of right fibula, initial encounter for closed fracture: Secondary | ICD-10-CM | POA: Diagnosis not present

## 2017-03-11 DIAGNOSIS — Z7401 Bed confinement status: Secondary | ICD-10-CM | POA: Diagnosis not present

## 2017-03-11 DIAGNOSIS — R262 Difficulty in walking, not elsewhere classified: Secondary | ICD-10-CM | POA: Diagnosis not present

## 2017-03-12 NOTE — Telephone Encounter (Signed)
Noted. She needs to schedule an appointment for lab work at her next available convenience. If she has home health we can have them do it.

## 2017-03-12 NOTE — Telephone Encounter (Signed)
Patient states she is unable to since she has a broken ankle

## 2017-03-13 DIAGNOSIS — W01198D Fall on same level from slipping, tripping and stumbling with subsequent striking against other object, subsequent encounter: Secondary | ICD-10-CM | POA: Diagnosis not present

## 2017-03-13 DIAGNOSIS — S82821D Torus fracture of lower end of right fibula, subsequent encounter for fracture with routine healing: Secondary | ICD-10-CM | POA: Diagnosis not present

## 2017-03-13 DIAGNOSIS — S838X1D Sprain of other specified parts of right knee, subsequent encounter: Secondary | ICD-10-CM | POA: Diagnosis not present

## 2017-03-13 NOTE — Telephone Encounter (Signed)
Verbal order for a BMET.

## 2017-03-13 NOTE — Telephone Encounter (Signed)
Patient states she will have home health call us for verbal orders tomorrow morning, what orders?

## 2017-03-14 DIAGNOSIS — S838X1D Sprain of other specified parts of right knee, subsequent encounter: Secondary | ICD-10-CM | POA: Diagnosis not present

## 2017-03-14 DIAGNOSIS — W01198D Fall on same level from slipping, tripping and stumbling with subsequent striking against other object, subsequent encounter: Secondary | ICD-10-CM | POA: Diagnosis not present

## 2017-03-14 DIAGNOSIS — S82821D Torus fracture of lower end of right fibula, subsequent encounter for fracture with routine healing: Secondary | ICD-10-CM | POA: Diagnosis not present

## 2017-03-17 DIAGNOSIS — S82821D Torus fracture of lower end of right fibula, subsequent encounter for fracture with routine healing: Secondary | ICD-10-CM | POA: Diagnosis not present

## 2017-03-17 DIAGNOSIS — S838X1D Sprain of other specified parts of right knee, subsequent encounter: Secondary | ICD-10-CM | POA: Diagnosis not present

## 2017-03-17 DIAGNOSIS — W01198D Fall on same level from slipping, tripping and stumbling with subsequent striking against other object, subsequent encounter: Secondary | ICD-10-CM | POA: Diagnosis not present

## 2017-03-17 NOTE — Telephone Encounter (Signed)
Waiting on home health nurse to call for orders

## 2017-03-19 DIAGNOSIS — W01198D Fall on same level from slipping, tripping and stumbling with subsequent striking against other object, subsequent encounter: Secondary | ICD-10-CM | POA: Diagnosis not present

## 2017-03-19 DIAGNOSIS — S838X1D Sprain of other specified parts of right knee, subsequent encounter: Secondary | ICD-10-CM | POA: Diagnosis not present

## 2017-03-19 DIAGNOSIS — S82821D Torus fracture of lower end of right fibula, subsequent encounter for fracture with routine healing: Secondary | ICD-10-CM | POA: Diagnosis not present

## 2017-03-20 ENCOUNTER — Ambulatory Visit: Payer: Medicare Other | Admitting: Family Medicine

## 2017-03-21 DIAGNOSIS — S838X1D Sprain of other specified parts of right knee, subsequent encounter: Secondary | ICD-10-CM | POA: Diagnosis not present

## 2017-03-21 DIAGNOSIS — S82821D Torus fracture of lower end of right fibula, subsequent encounter for fracture with routine healing: Secondary | ICD-10-CM | POA: Diagnosis not present

## 2017-03-21 DIAGNOSIS — W01198D Fall on same level from slipping, tripping and stumbling with subsequent striking against other object, subsequent encounter: Secondary | ICD-10-CM | POA: Diagnosis not present

## 2017-03-26 DIAGNOSIS — S838X1D Sprain of other specified parts of right knee, subsequent encounter: Secondary | ICD-10-CM | POA: Diagnosis not present

## 2017-03-26 DIAGNOSIS — S82821D Torus fracture of lower end of right fibula, subsequent encounter for fracture with routine healing: Secondary | ICD-10-CM | POA: Diagnosis not present

## 2017-03-26 DIAGNOSIS — W01198D Fall on same level from slipping, tripping and stumbling with subsequent striking against other object, subsequent encounter: Secondary | ICD-10-CM | POA: Diagnosis not present

## 2017-03-27 DIAGNOSIS — S82821A Torus fracture of lower end of right fibula, initial encounter for closed fracture: Secondary | ICD-10-CM | POA: Diagnosis not present

## 2017-03-27 DIAGNOSIS — S838X1A Sprain of other specified parts of right knee, initial encounter: Secondary | ICD-10-CM | POA: Diagnosis not present

## 2017-04-01 ENCOUNTER — Other Ambulatory Visit (INDEPENDENT_AMBULATORY_CARE_PROVIDER_SITE_OTHER): Payer: Medicare Other

## 2017-04-01 DIAGNOSIS — I10 Essential (primary) hypertension: Secondary | ICD-10-CM | POA: Diagnosis not present

## 2017-04-01 LAB — BASIC METABOLIC PANEL
BUN: 14 mg/dL (ref 6–23)
CHLORIDE: 100 meq/L (ref 96–112)
CO2: 33 meq/L — AB (ref 19–32)
Calcium: 9.7 mg/dL (ref 8.4–10.5)
Creatinine, Ser: 0.73 mg/dL (ref 0.40–1.20)
GFR: 80.04 mL/min (ref >60.00)
GLUCOSE: 176 mg/dL — AB (ref 70–99)
POTASSIUM: 4.1 meq/L (ref 3.5–5.1)
SODIUM: 140 meq/L (ref 135–145)

## 2017-04-02 DIAGNOSIS — S838X1D Sprain of other specified parts of right knee, subsequent encounter: Secondary | ICD-10-CM | POA: Diagnosis not present

## 2017-04-02 DIAGNOSIS — W01198D Fall on same level from slipping, tripping and stumbling with subsequent striking against other object, subsequent encounter: Secondary | ICD-10-CM | POA: Diagnosis not present

## 2017-04-02 DIAGNOSIS — S82821D Torus fracture of lower end of right fibula, subsequent encounter for fracture with routine healing: Secondary | ICD-10-CM | POA: Diagnosis not present

## 2017-04-24 DIAGNOSIS — S838X1A Sprain of other specified parts of right knee, initial encounter: Secondary | ICD-10-CM | POA: Diagnosis not present

## 2017-04-24 DIAGNOSIS — S82821A Torus fracture of lower end of right fibula, initial encounter for closed fracture: Secondary | ICD-10-CM | POA: Diagnosis not present

## 2017-05-08 ENCOUNTER — Ambulatory Visit: Payer: Medicare Other | Admitting: Podiatry

## 2017-05-12 ENCOUNTER — Ambulatory Visit: Payer: Medicare Other | Admitting: Podiatry

## 2017-05-19 ENCOUNTER — Encounter: Payer: Self-pay | Admitting: Podiatry

## 2017-05-19 ENCOUNTER — Ambulatory Visit: Payer: Medicare Other | Admitting: Podiatry

## 2017-05-19 DIAGNOSIS — B351 Tinea unguium: Secondary | ICD-10-CM | POA: Diagnosis not present

## 2017-05-19 DIAGNOSIS — E119 Type 2 diabetes mellitus without complications: Secondary | ICD-10-CM

## 2017-05-19 DIAGNOSIS — M79676 Pain in unspecified toe(s): Secondary | ICD-10-CM

## 2017-05-19 NOTE — Progress Notes (Signed)
Complaint:  Visit Type: Patient returns to my office for continued preventative foot care services. Complaint: Patient states" my nails have grown long and thick and become painful to walk and wear shoes" Patient has been diagnosed with DM with no foot complications. She says she has recently broken her right ankle which is healing.  Swelling noted right ankle. The patient presents for preventative foot care services. No changes to ROS  Podiatric Exam: Vascular: dorsalis pedis and posterior tibial pulses are palpable bilateral. Capillary return is immediate. Temperature gradient is WNL. Skin turgor WNL  Sensorium: Normal Semmes Weinstein monofilament test. Normal tactile sensation bilaterally. Nail Exam: Pt has thick disfigured discolored nails with subungual debris noted bilateral entire nail hallux through fifth toenails Ulcer Exam: There is no evidence of ulcer or pre-ulcerative changes or infection. Orthopedic Exam: Muscle tone and strength are WNL. No limitations in general ROM. No crepitus or effusions noted. Foot type and digits show no abnormalities. Bony prominences are unremarkable. Skin: No Porokeratosis. No infection or ulcers  Diagnosis:  Onychomycosis, , Pain in right toe, pain in left toes  Treatment & Plan Procedures and Treatment: Consent by patient was obtained for treatment procedures.   Debridement of mycotic and hypertrophic toenails, 1 through 5 bilateral and clearing of subungual debris. No ulceration, no infection noted. Told to wear compression socks for swelling.   Return Visit-Office Procedure: Patient instructed to return to the office for a follow up visit 3 months for continued evaluation and treatment.    Helane GuntherGregory Vyla Pint DPM

## 2017-05-22 ENCOUNTER — Ambulatory Visit: Payer: Medicare Other | Admitting: Podiatry

## 2017-06-06 ENCOUNTER — Other Ambulatory Visit: Payer: Self-pay | Admitting: Family Medicine

## 2017-06-12 DIAGNOSIS — M25571 Pain in right ankle and joints of right foot: Secondary | ICD-10-CM | POA: Diagnosis not present

## 2017-06-26 ENCOUNTER — Ambulatory Visit (INDEPENDENT_AMBULATORY_CARE_PROVIDER_SITE_OTHER): Payer: Medicare Other

## 2017-06-26 VITALS — BP 160/80 | HR 90 | Temp 97.8°F | Resp 15 | Ht 65.0 in | Wt 168.4 lb

## 2017-06-26 DIAGNOSIS — Z0000 Encounter for general adult medical examination without abnormal findings: Secondary | ICD-10-CM | POA: Diagnosis not present

## 2017-06-26 DIAGNOSIS — Z1331 Encounter for screening for depression: Secondary | ICD-10-CM | POA: Diagnosis not present

## 2017-06-26 NOTE — Progress Notes (Signed)
Subjective:   Laurie Patel is a 84 y.o. female who presents for Medicare Annual (Subsequent) preventive examination.  Review of Systems:  No ROS.  Medicare Wellness Visit. Additional risk factors are reflected in the social history.  Cardiac Risk Factors include: advanced age (>4155men, 72>65 women);diabetes mellitus;hypertension     Objective:     Vitals: BP (!) 160/80 (BP Location: Left Arm, Patient Position: Sitting, Cuff Size: Normal)   Pulse 90   Temp 97.8 F (36.6 C) (Oral)   Resp 15   Ht 5\' 5"  (1.651 m)   Wt 168 lb 6.4 oz (76.4 kg)   SpO2 94%   BMI 28.02 kg/m   Body mass index is 28.02 kg/m.  Advanced Directives 06/26/2017 03/05/2017  Does Patient Have a Medical Advance Directive? No No  Would patient like information on creating a medical advance directive? No - Patient declined -    Tobacco Social History   Tobacco Use  Smoking Status Never Smoker  Smokeless Tobacco Never Used     Counseling given: Not Answered   Clinical Intake:  Pre-visit preparation completed: Yes  Pain : No/denies pain     Nutritional Status: BMI > 30  Obese Diabetes: Yes(Followed by PCP)  How often do you need to have someone help you when you read instructions, pamphlets, or other written materials from your doctor or pharmacy?: 1 - Never  Interpreter Needed?: No     Past Medical History:  Diagnosis Date  . Heart murmur    Past Surgical History:  Procedure Laterality Date  . APPENDECTOMY  1986  . EYE SURGERY Bilateral 1995 and 1999   Lens implants  . WRIST SURGERY Left 1986   ORIF    Family History  Problem Relation Age of Onset  . Cancer Mother [id number]       Breast Cancer  . Heart disease Father 3883       Congestive Heart Failure  . COPD Father 7283       Emphysema  . Heart disease Brother 5378       massive heart attack  . Heart disease Maternal Grandmother   . Heart disease Maternal Grandfather   . Early death Paternal Grandmother        Early death unsure age    . Heart disease Paternal Grandfather   . Obesity Paternal Grandfather    Social History   Socioeconomic History  . Marital status: Married    Spouse name: None  . Number of children: None  . Years of education: None  . Highest education level: None  Social Needs  . Financial resource strain: None  . Food insecurity - worry: None  . Food insecurity - inability: None  . Transportation needs - medical: None  . Transportation needs - non-medical: None  Occupational History  . None  Tobacco Use  . Smoking status: Never Smoker  . Smokeless tobacco: Never Used  Substance and Sexual Activity  . Alcohol use: No    Alcohol/week: 0.0 oz  . Drug use: No  . Sexual activity: No  Other Topics Concern  . None  Social History Narrative   [place] in [place]    Owns a [employer]    1 dog    Outpatient Encounter Medications as of 06/26/2017  Medication Sig  . aspirin 81 MG EC tablet Take 81 mg by mouth daily. Swallow whole.  . cholecalciferol (VITAMIN D) 1000 units tablet Take 1,000 Units by mouth daily.  . fexofenadine (ALLEGRA) 180  MG tablet Take 180 mg by mouth daily.  Marland Kitchen lisinopril (PRINIVIL,ZESTRIL) 5 MG tablet TAKE ONE TABLET BY MOUTH DAILY  . Multiple Vitamins-Calcium (ONE-A-DAY WOMENS PO) Take 1 tablet by mouth daily.  . Omega 3 1200 MG CAPS Take 1,200 mg by mouth 1 day or 1 dose.   . [DISCONTINUED] HYDROcodone-acetaminophen (NORCO/VICODIN) 5-325 MG tablet Take 1 tablet by mouth every 4 (four) hours as needed.  . [DISCONTINUED] Influenza vac split quadrivalent PF (FLUZONE HIGH-DOSE) 0.5 ML injection Fluzone High-Dose 2017-2018 (PF) 180 mcg/0.5 mL intramuscular syringe   No facility-administered encounter medications on file as of 06/26/2017.     Activities of Daily Living In your present state of health, do you have any difficulty performing the following activities: 06/26/2017  Hearing? N  Vision? N  Difficulty concentrating or making decisions? N  Walking or climbing  stairs? Y  Comment Unsteady gait  Dressing or bathing? N  Doing errands, shopping? Y  Comment She does not Engineer, manufacturing and eating ? N  Using the Toilet? N  In the past six months, have you accidently leaked urine? N  Do you have problems with loss of bowel control? N  Managing your Medications? N  Managing your Finances? N  Housekeeping or managing your Housekeeping? N  Some recent data might be hidden    Patient Care Team: Glori Luis, MD as PCP - General (Family Medicine)    Assessment:   This is a routine wellness examination for [place]. The goal of the wellness visit is to assist the patient how to close the gaps in care and create a preventative care plan for the patient.   The roster of all physicians providing medical care to patient is listed in the Snapshot section of the chart.  Taking calcium VIT D as appropriate/Osteoporosis risk reviewed.    Safety issues reviewed; Smoke and carbon monoxide detectors in the home. No firearms in the home.  Wears seatbelts when riding with others. Patient does wear sunscreen or protective clothing when in direct sunlight. No violence in the home.  Depression- PHQ 2 &9 complete.  No signs/symptoms or verbal communication regarding little pleasure in doing things, feeling down, depressed or hopeless. No changes in sleeping, energy, eating, concentrating.  No thoughts of self harm or harm towards others.  Time spent on this topic is 10 minutes.   Patient is alert, normal appearance, oriented to person/place/and time. Correctly identified the president of the [place], recall of 3/3 words, and performing simple calculations. Displays appropriate judgement and can read correct time from watch face.    No new identified risk were noted.  No failures at ADL's or IADL's.  Ambulates with cane.  BMI- discussed the importance of a healthy diet, water intake and the benefits of aerobic exercise. Educational material provided.   24  hour diet recall: Low carb foods/diabetic diet.  Daily fluid intake: 0 cups of caffeine, 8-10 cups of water  Dental- every 6 months.  Dr. Marden Noble.  Eye- Visual acuity not assessed per patient preference since they have regular follow up with the ophthalmologist.  Wears corrective lenses.  Sleep patterns- Sleeps 8 hours at night.  Wakes feeling rested.   Pneumonia vaccine deferred per patient. Educational material provided.  HTN; followed by PCP.  Appointment offered to follow up with blood pressure, declined.  States she is taking her medication as prescribed and it is usually high during visits.  Encouraged to monitor BP readings at home and schedule a follow  up.  Patient Concerns: None at this time. Follow up with PCP as needed.  Exercise Activities and Dietary recommendations Current Exercise Habits: Home exercise routine, Type of exercise: calisthenics(Kettle bell arm exerices), Intensity: Mild  Goals    . Increase physical activity     Continue use of kettle bell arm exercises to increase strength       Fall Risk Fall Risk  06/26/2017 09/19/2016 09/14/2015 09/14/2014  Falls in the past year? Yes No No No  Number falls in past yr: 1 - - -  Injury with Fall? Yes - - -  Comment Followed by PCP - - -    Depression Screen PHQ 2/9 Scores 06/26/2017 09/19/2016 09/14/2015  PHQ - 2 Score 0 0 0  PHQ- 9 Score 0 - -     Cognitive Function MMSE - Mini Mental State Exam 06/26/2017  Orientation to time 5  Orientation to Place 5  Registration 3  Attention/ Calculation 5  Recall 3  Language- name 2 objects 2  Language- repeat 1  Language- follow 3 step command 3  Language- read & follow direction 1  Write a sentence 1  Copy design 1  Total score 30        Immunization History  Administered Date(s) Administered  . Influenza Split 02/24/2013, 03/24/2014, 02/29/2016  . Influenza-Unspecified 04/07/2017  . Pneumococcal-Unspecified 03/24/2014  . Td 02/08/2010  . Tdap 06/24/2008     Screening Tests Health Maintenance  Topic Date Due  . FOOT EXAM  09/01/1937  . HEMOGLOBIN A1C  03/22/2017  . PNA vac Low Risk Adult (2 of 2 - PCV13) 06/26/2018 (Originally 03/25/2015)  . OPHTHALMOLOGY EXAM  09/19/2017  . TETANUS/TDAP  02/09/2020  . INFLUENZA VACCINE  Completed  . DEXA SCAN  Completed      Plan:   End of life planning; Advanced aging; Advanced directives discussed.  No HCPOA/Living Will.  Additional information declined at this time.  I have personally reviewed and noted the following in the patient's chart:   . Medical and social history . Use of alcohol, tobacco or illicit drugs  . Current medications and supplements . Functional ability and status . Nutritional status . Physical activity . Advanced directives . List of other physicians . Hospitalizations, surgeries, and ER visits in previous 12 months . Vitals . Screenings to include cognitive, depression, and falls . Referrals and appointments  In addition, I have reviewed and discussed with patient certain preventive protocols, quality metrics, and best practice recommendations. A written personalized care plan for preventive services as well as general preventive health recommendations were provided to patient.     Ashok Pall, LPN  10/26/979

## 2017-06-26 NOTE — Patient Instructions (Addendum)
  Ms. Laurie Patel , Thank you for taking time to come for your Medicare Wellness Visit. I appreciate your ongoing commitment to your health goals. Please review the following plan we discussed and let me know if I can assist you in the future.   Monitor blood pressure readings and follow up with Dr. Birdie SonsSonnenberg.    Have a great day!  These are the goals we discussed: Goals    . Increase physical activity     Continue use of kettle bell arm exercises to increase strength       This is a list of the screening recommended for you and due dates:  Health Maintenance  Topic Date Due  . Complete foot exam   09/01/1937  . Hemoglobin A1C  03/22/2017  . Pneumonia vaccines (2 of 2 - PCV13) 06/26/2018*  . Eye exam for diabetics  09/19/2017  . Tetanus Vaccine  02/09/2020  . Flu Shot  Completed  . DEXA scan (bone density measurement)  Completed  *Topic was postponed. The date shown is not the original due date.

## 2017-08-25 ENCOUNTER — Ambulatory Visit: Payer: Self-pay | Admitting: *Deleted

## 2017-08-25 NOTE — Telephone Encounter (Signed)
Pt reports H/O inner ear "problems." States clear drainage from right ear 2-3 weeks. Scant "spoty" amount of blood noted "Friday or Saturday" not dripping,noted when wiping ear. States "clot came out Sunday. No bleeding since." Ears clogged, intermittent pain at times "shooting."  States was to see an ENT but could not make the appointment. States this is "nothing new."  Reports dizziness at times, ambulates with cane. Denies fever. Pt could not make appt until Thursday due to transportation issues. Will only go to [place] [facility]. Appt made for Thursday with J. Kordsmeier. Care advise given per protocol.   Reason for Disposition . [1] Clear drainage (not from a head injury) AND [2] persists > 24 hours  Answer Assessment - Initial Assessment Questions 1. LOCATION: "Which ear is involved?"      Right ear 2. COLOR: "What is the color of the discharge?"      Some blood "spotty", clear discharge 3. CONSISTENCY: "How runny is the discharge? Could it be water?"      Thicker than water 4. ONSET: "When did you first notice the discharge?"     2-3 weeks 5. PAIN: "Is there any earache?" "How bad is it?"  (Scale 1-10; or mild, moderate, severe)     Intermittent, sharp shooting pain at times, from right eye 6. OBJECTS: "Any use of q-tips or have you inserted anything else in your ear?"     At itmes 7. OTHER SYMPTOMS: "Do you have any other symptoms?" (e.g., headache, fever, dizziness, vomiting, runny nose)    Dizziness, "sinus and inner ear problems."  Protocols used: EAR - DISCHARGE-A-AH

## 2017-08-28 ENCOUNTER — Ambulatory Visit (INDEPENDENT_AMBULATORY_CARE_PROVIDER_SITE_OTHER): Payer: Medicare Other | Admitting: Family Medicine

## 2017-08-28 ENCOUNTER — Encounter: Payer: Self-pay | Admitting: Family Medicine

## 2017-08-28 VITALS — BP 160/70 | HR 70 | Temp 98.0°F | Resp 16 | Wt 168.1 lb

## 2017-08-28 DIAGNOSIS — H66001 Acute suppurative otitis media without spontaneous rupture of ear drum, right ear: Secondary | ICD-10-CM | POA: Diagnosis not present

## 2017-08-28 MED ORDER — AMOXICILLIN 500 MG PO CAPS: 500.0000 mg | ORAL_CAPSULE | Freq: Two times a day (BID) | ORAL | 0 refills | Status: DC

## 2017-08-28 NOTE — Progress Notes (Signed)
Subjective:    Patient ID: STEPHAN DRAUGHN, female    DOB: 03-21-1928, 84 y.o.   MRN: [id number]  HPI  Ms. Awwad is a 84 year old female who presents today with ear drainage and ear pain that have been present for two weeks. Associated drainage and minimal bleeding in right ear reported by patient. No visible dripping from right ear. Associated rhinitis, post nasal drip, and sinus pressure have been present.  No indoor swimming or underwater activities.  Bleeding has resolved but she states that drainage is still present.  She denies fever, chills, sweats, SOB, myalgias, tinnitus, dizziness, or cough. Alleviating factors: None Exacerbating factors: None. She states that she feels well but the "dealing with ear dripping" wears her out.  She experienced this in 10/2016 and was referred to ENT. She stated that symptom "cleared up" so she did not go to the ENT. History of chronic ear infections as a child with recurrent episodes throughout adulthood.  Review of Systems  Constitutional: Negative for chills, fatigue and fever.  HENT: Positive for ear discharge, ear pain, postnasal drip, rhinorrhea and sinus pressure. Negative for hearing loss, sore throat and tinnitus.   Respiratory: Negative for cough, shortness of breath and wheezing.   Cardiovascular: Negative for chest pain and palpitations.  Gastrointestinal: Negative for nausea and vomiting.  Musculoskeletal: Negative for myalgias.  Skin: Negative for rash.  Neurological: Negative for dizziness and headaches.   Past Medical History:  Diagnosis Date  . Heart murmur      Social History   Socioeconomic History  . Marital status: Married    Spouse name: Not on file  . Number of children: Not on file  . Years of education: Not on file  . Highest education level: Not on file  Social Needs  . Financial resource strain: Not on file  . Food insecurity - worry: Not on file  . Food insecurity - inability: Not on file  . Transportation  needs - medical: Not on file  . Transportation needs - non-medical: Not on file  Occupational History  . Not on file  Tobacco Use  . Smoking status: Never Smoker  . Smokeless tobacco: Never Used  Substance and Sexual Activity  . Alcohol use: No    Alcohol/week: 0.0 oz  . Drug use: No  . Sexual activity: No  Other Topics Concern  . Not on file  Social History Narrative   Lives in [place]    Owns a [employer]    1 dog    Past Surgical History:  Procedure Laterality Date  . APPENDECTOMY  1986  . EYE SURGERY Bilateral 1995 and 1999   Lens implants  . WRIST SURGERY Left 1986   ORIF     Family History  Problem Relation Age of Onset  . Cancer Mother 56       Breast Cancer  . Heart disease Father 24       Congestive Heart Failure  . COPD Father 68       Emphysema  . Heart disease Brother 47       massive heart attack  . Heart disease Maternal Grandmother   . Heart disease Maternal Grandfather   . Early death Paternal Grandmother        Early death unsure age  . Heart disease Paternal Grandfather   . Obesity Paternal Grandfather     No Known Allergies  Current Outpatient Medications on File Prior to Visit  Medication Sig Dispense Refill  .  aspirin 81 MG EC tablet Take 81 mg by mouth daily. Swallow whole.    . cholecalciferol (VITAMIN D) 1000 units tablet Take 1,000 Units by mouth daily.    . fexofenadine (ALLEGRA) 180 MG tablet Take 180 mg by mouth daily.    Marland Kitchen. lisinopril (PRINIVIL,ZESTRIL) 5 MG tablet TAKE ONE TABLET BY MOUTH DAILY 90 tablet 0  . Multiple Vitamins-Calcium (ONE-A-DAY WOMENS PO) Take 1 tablet by mouth daily.    . Omega 3 1200 MG CAPS Take 1,200 mg by mouth 1 day or 1 dose.      No current facility-administered medications on file prior to visit.     BP (!) 160/70 (BP Location: Right Arm, Patient Position: Sitting, Cuff Size: Normal)   Pulse 70   Temp 98 F (36.7 C) (Oral)   Resp 16   Wt 168 lb 2 oz (76.3 kg)   SpO2 95%   BMI 27.98 kg/m         Objective:   Physical Exam  Constitutional: She is oriented to person, place, and time. She appears well-developed and well-nourished.  HENT:  Right Ear: Tympanic membrane is erythematous and bulging. Tympanic membrane is not perforated.  Left Ear: Tympanic membrane normal.  Nose: Rhinorrhea present. Right sinus exhibits no maxillary sinus tenderness and no frontal sinus tenderness. Left sinus exhibits no maxillary sinus tenderness and no frontal sinus tenderness.  Mouth/Throat: Mucous membranes are normal. No oropharyngeal exudate or posterior oropharyngeal erythema.  Post nasal drip present  [place]: Pupils are equal, round, and reactive to light. No scleral icterus.  Neck: Neck supple.  Cardiovascular: Normal rate and regular rhythm.  Pulmonary/Chest: Effort normal and breath sounds normal. She has no wheezes. She has no rales.  Lymphadenopathy:    She has no cervical adenopathy.  Neurological: She is alert and oriented to person, place, and time. Coordination normal.  Skin: Skin is warm and dry. No rash noted.  Psychiatric: She has a normal mood and affect. Her behavior is normal. Judgment and thought content normal.      Assessment & Plan:  1. Acute suppurative otitis media of right ear without spontaneous rupture of tympanic membrane, recurrence not specified Exam and history support treatment for AOM. TMs visualized and I do not appreciate a perforation or drainage at this time.  Due to prior history of same problem and history of chronic infections throughout her life, and previous ENT referral that she did not make due to time of appointment per patient; will treat today with amoxicillin and send referral to ENT. Further advised her to ask for an appointment time that will work with her schedule and her daughter's schedule who brings her to appointments.  Finally, provided strict return precautions if symptoms do not improve with treatment or worsen prior to seeing ENT. She  voiced understanding and agreed with plan.  - amoxicillin (AMOXIL) 500 MG capsule; Take 1 capsule (500 mg total) by mouth 2 (two) times daily.  Dispense: 20 capsule; Refill: 0 - Ambulatory referral to ENT  Roddie McJulia Laquita Harlan, FNP-C

## 2017-08-28 NOTE — Patient Instructions (Signed)
Please take antibiotic with food as directed. You have been referred to ENT for evaluation as this has been a chronic concern. If symptoms do not improve or worsen before you go to ENT, please follow up with your provider.   Otitis Media, Adult Otitis media is redness, soreness, and puffiness (swelling) in the space just behind your eardrum (middle ear). It may be caused by allergies or infection. It often happens along with a cold. Follow these instructions at home:  Take your medicine as told. Finish it even if you start to feel better.  Only take over-the-counter or prescription medicines for pain, discomfort, or fever as told by your doctor.  Follow up with your doctor as told. Contact a doctor if:  You have otitis media only in one ear, or bleeding from your nose, or both.  You notice a lump on your neck.  You are not getting better in 3-5 days.  You feel worse instead of better. Get help right away if:  You have pain that is not helped with medicine.  You have puffiness, redness, or pain around your ear.  You get a stiff neck.  You cannot move part of your face (paralysis).  You notice that the bone behind your ear hurts when you touch it. This information is not intended to replace advice given to you by your health care provider. Make sure you discuss any questions you have with your health care provider. Document Released: 11/27/2007 Document Revised: 11/16/2015 Document Reviewed: 01/05/2013 Elsevier Interactive Patient Education  2017 [employer]

## 2017-09-01 ENCOUNTER — Other Ambulatory Visit: Payer: Self-pay | Admitting: Family Medicine

## 2017-09-12 ENCOUNTER — Telehealth: Payer: Self-pay

## 2017-09-12 NOTE — Telephone Encounter (Signed)
A referral to ENT was placed on 08/28/17, the day of her visit. Will you please check to see if any further information and needed and let patient know? Thank you

## 2017-09-12 NOTE — Telephone Encounter (Signed)
Copied from CRM [id number]. Topic: Referral - Status >> Sep 12, 2017  9:53 AM Guinevere FerrariMorris, Sharamare E, NT wrote: Reason for CRM: Patient called and wanted to follow up about her receiving a referral to the Ear Nose and Throat doctor. Patient said she is still having problems with her ears and needs to be seen soon as possible. Pt would like a call back.

## 2017-09-15 NOTE — Telephone Encounter (Signed)
Referral was refaxed on 09/11/17.

## 2017-10-01 ENCOUNTER — Other Ambulatory Visit: Payer: Self-pay | Admitting: Family Medicine

## 2017-10-09 ENCOUNTER — Ambulatory Visit: Payer: Medicare Other | Admitting: Family Medicine

## 2017-10-14 ENCOUNTER — Ambulatory Visit (INDEPENDENT_AMBULATORY_CARE_PROVIDER_SITE_OTHER): Payer: Medicare Other | Admitting: Family Medicine

## 2017-10-14 ENCOUNTER — Other Ambulatory Visit: Payer: Self-pay

## 2017-10-14 ENCOUNTER — Encounter: Payer: Self-pay | Admitting: Family Medicine

## 2017-10-14 VITALS — BP 150/60 | HR 71 | Temp 97.6°F | Ht 65.0 in | Wt 168.0 lb

## 2017-10-14 DIAGNOSIS — H66004 Acute suppurative otitis media without spontaneous rupture of ear drum, recurrent, right ear: Secondary | ICD-10-CM

## 2017-10-14 DIAGNOSIS — E785 Hyperlipidemia, unspecified: Secondary | ICD-10-CM

## 2017-10-14 DIAGNOSIS — E119 Type 2 diabetes mellitus without complications: Secondary | ICD-10-CM

## 2017-10-14 DIAGNOSIS — I10 Essential (primary) hypertension: Secondary | ICD-10-CM | POA: Diagnosis not present

## 2017-10-14 DIAGNOSIS — R2689 Other abnormalities of gait and mobility: Secondary | ICD-10-CM | POA: Insufficient documentation

## 2017-10-14 DIAGNOSIS — H6690 Otitis media, unspecified, unspecified ear: Secondary | ICD-10-CM | POA: Insufficient documentation

## 2017-10-14 DIAGNOSIS — S82899A Other fracture of unspecified lower leg, initial encounter for closed fracture: Secondary | ICD-10-CM

## 2017-10-14 DIAGNOSIS — S82891A Other fracture of right lower leg, initial encounter for closed fracture: Secondary | ICD-10-CM | POA: Diagnosis not present

## 2017-10-14 HISTORY — DX: Other fracture of unspecified lower leg, initial encounter for closed fracture: S82.899A

## 2017-10-14 LAB — LIPID PANEL
Cholesterol: 190 mg/dL (ref 0–200)
HDL: 41 mg/dL (ref >39.00)
LDL CALC: 114 mg/dL — AB (ref 0–99)
NONHDL: 149.23
Total CHOL/HDL Ratio: 5
Triglycerides: 176 mg/dL — ABNORMAL HIGH (ref 0.0–149.0)
VLDL: 35.2 mg/dL (ref 0.0–40.0)

## 2017-10-14 LAB — COMPREHENSIVE METABOLIC PANEL
ALBUMIN: 4.5 g/dL (ref 3.5–5.2)
ALK PHOS: 82 U/L (ref 39–117)
ALT: 17 U/L (ref 0–35)
AST: 17 U/L (ref 0–37)
BUN: 12 mg/dL (ref 6–23)
CALCIUM: 10.1 mg/dL (ref 8.4–10.5)
CO2: 33 meq/L — AB (ref 19–32)
Chloride: 97 mEq/L (ref 96–112)
Creatinine, Ser: 0.7 mg/dL (ref 0.40–1.20)
GFR: 83.91 mL/min (ref >60.00)
GLUCOSE: 211 mg/dL — AB (ref 70–99)
Potassium: 4.2 mEq/L (ref 3.5–5.1)
Sodium: 136 mEq/L (ref 135–145)
Total Bilirubin: 0.4 mg/dL (ref 0.2–1.2)
Total Protein: 6.9 g/dL (ref 6.0–8.3)

## 2017-10-14 LAB — HEMOGLOBIN A1C: Hgb A1c MFr Bld: 8.2 % — ABNORMAL HIGH (ref 4.6–6.5)

## 2017-10-14 MED ORDER — AMOXICILLIN-POT CLAVULANATE 875-125 MG PO TABS: 1.0000 | ORAL_TABLET | Freq: Two times a day (BID) | ORAL | 0 refills | Status: DC

## 2017-10-14 MED ORDER — CEFDINIR 300 MG PO CAPS
300.0000 mg | ORAL_CAPSULE | Freq: Two times a day (BID) | ORAL | 0 refills | Status: DC
Start: 2017-10-14 — End: 2018-02-16

## 2017-10-14 NOTE — Progress Notes (Signed)
Tommi Rumps, MD Phone: [phone]  Laurie Patel is a 84 y.o. female who presents today for f/u.  HYPERTENSION  Disease Monitoring  Home BP Monitoring not checking Chest pain- no    Dyspnea- no Medications  Compliance-  Taking lisinopril.  Edema- no  DIABETES Disease Monitoring: Blood Sugar ranges-not checking Polyuria/phagia/dipsia- no      Optho- due for exam Medications- none  Ear infection: Patient was seen by her nurse practitioner for right ear infection in February.  She was treated with amoxicillin.  Notes it drained up until about 2 weeks ago.  Notes wax is coming out now.  Occasional discomfort.  No fevers.  She sees ENT this week.  She did complete the amoxicillin.  She has chronic hearing issues.  She reports she broke her right ankle last year.  She was in a walking boot and followed by orthopedics.  Notes that has improved significantly.  She reports chronic balance issues for years.  It is worse if she has an inner ear issue.  Notes some dizziness if she turns her head too fast and then she will lose her balance.  Feels unsteady on her feet at times.  She is using a rolling walker at home which has been beneficial.  Last fall was a week ago with no injury.  No head injury.   Social History   Tobacco Use  Smoking Status Never Smoker  Smokeless Tobacco Never Used     ROS see history of present illness  Objective  Physical Exam Vitals:   10/14/17 0823  BP: (!) 150/60  Pulse: 71  Temp: 97.6 F (36.4 C)  SpO2: 96%    BP Readings from Last 3 Encounters:  10/14/17 (!) 150/60  08/28/17 (!) 160/70  06/26/17 (!) 160/80   Wt Readings from Last 3 Encounters:  10/14/17 168 lb (76.2 kg)  08/28/17 168 lb 2 oz (76.3 kg)  06/26/17 168 lb 6.4 oz (76.4 kg)    Physical Exam  Constitutional: No distress.  HENT:  Right TM with purulence behind it, no evidence of rupture, left ear canal with small old blood near the opening though no fresh blood, normal  left TM  Cardiovascular: Normal rate, regular rhythm and normal heart sounds.  Pulmonary/Chest: Effort normal and breath sounds normal.  Musculoskeletal: She exhibits no edema.  Neurological: She is alert.  CN 2-12 intact, 5/5 strength in bilateral biceps, triceps, grip, quads, hamstrings, plantar and dorsiflexion, sensation to light touch intact in bilateral UE and LE, slow stable gait  Skin: Skin is warm and dry. She is not diaphoretic.   Diabetic Foot Exam - Simple   Simple Foot Form Diabetic Foot exam was performed with the following findings:  Yes 10/14/2017 12:58 PM  Visual Inspection See comments:  Yes Sensation Testing Intact to touch and monofilament testing bilaterally:  Yes Pulse Check Posterior Tibialis and Dorsalis pulse intact bilaterally:  Yes Comments Right first toe missing a toenail, flaky skin on bottom of feet, otherwise no deformities, ulcerations, or skin breakdown      Assessment/Plan: Please see individual problem list.  HTN (hypertension) Controlled for age.  Continue current medication.  Diabetes ([place]) No meds currently.  Check A1c.  Foot exam completed.  Otitis media Patient treated with amoxicillin previously.  We will treat her with Augmentin.  She will see ENT as planned later this week.  Hyperlipidemia Check lipid panel.  Ankle fracture Healed at this time.  Potentially could have been contributing to her balance issues.  Balance problem Suspect multifactorial cause for this.  Her right ankle issues could be contributing.  Her recurrent inner ear issues could be contributing as well.  She is neurologically intact.  She will see ENT for evaluation of her inner ear and otitis media issues.  I offered physical therapy though she declined this.  She will walk with a walker.  Orders Placed This Encounter  Procedures  . HgB A1c  . Comp Met (CMET)  . Lipid panel    Meds ordered this encounter  Medications  . amoxicillin-clavulanate  (AUGMENTIN) 875-125 MG tablet    Sig: Take 1 tablet by mouth 2 (two) times daily.    Dispense:  14 tablet    Refill:  0  . cefdinir (OMNICEF) 300 MG capsule    Sig: Take 1 capsule (300 mg total) by mouth 2 (two) times daily.    Dispense:  20 capsule    Refill:  0     Tommi Rumps, MD [place]

## 2017-10-14 NOTE — Patient Instructions (Addendum)
Nice to see you. We will treat your ear infection with an antibiotic.  I sent cefdinir to your pharmacy.  Please see the ear nose and throat physician as planned. If you would like to consider physical therapy for your balance issues please let us know. We will check lab work today and contact you with the results.

## 2017-10-14 NOTE — Assessment & Plan Note (Signed)
Check lipid panel  

## 2017-10-14 NOTE — Assessment & Plan Note (Signed)
Suspect multifactorial cause for this.  Her right ankle issues could be contributing.  Her recurrent inner ear issues could be contributing as well.  She is neurologically intact.  She will see ENT for evaluation of her inner ear and otitis media issues.  I offered physical therapy though she declined this.  She will walk with a walker.

## 2017-10-14 NOTE — Assessment & Plan Note (Signed)
Patient treated with amoxicillin previously.  We will treat her with Augmentin.  She will see ENT as planned later this week.

## 2017-10-14 NOTE — Assessment & Plan Note (Signed)
Healed at this time.  Potentially could have been contributing to her balance issues.

## 2017-10-14 NOTE — Assessment & Plan Note (Signed)
Controlled for age.  Continue current medication.

## 2017-10-14 NOTE — Assessment & Plan Note (Addendum)
No meds currently.  Check A1c.  Foot exam completed.

## 2017-10-16 DIAGNOSIS — H7201 Central perforation of tympanic membrane, right ear: Secondary | ICD-10-CM | POA: Diagnosis not present

## 2017-10-16 DIAGNOSIS — H9211 Otorrhea, right ear: Secondary | ICD-10-CM | POA: Diagnosis not present

## 2017-10-16 DIAGNOSIS — J300 Vasomotor rhinitis: Secondary | ICD-10-CM | POA: Diagnosis not present

## 2017-10-28 ENCOUNTER — Other Ambulatory Visit: Payer: Self-pay | Admitting: Family Medicine

## 2017-10-30 DIAGNOSIS — H9211 Otorrhea, right ear: Secondary | ICD-10-CM | POA: Diagnosis not present

## 2017-10-30 DIAGNOSIS — R42 Dizziness and giddiness: Secondary | ICD-10-CM | POA: Diagnosis not present

## 2017-11-03 ENCOUNTER — Ambulatory Visit: Payer: Medicare Other | Admitting: Podiatry

## 2017-11-03 ENCOUNTER — Encounter: Payer: Self-pay | Admitting: Podiatry

## 2017-11-03 DIAGNOSIS — M79676 Pain in unspecified toe(s): Secondary | ICD-10-CM | POA: Diagnosis not present

## 2017-11-03 DIAGNOSIS — E119 Type 2 diabetes mellitus without complications: Secondary | ICD-10-CM | POA: Diagnosis not present

## 2017-11-03 DIAGNOSIS — B351 Tinea unguium: Secondary | ICD-10-CM

## 2017-11-03 NOTE — Progress Notes (Signed)
Complaint:  Visit Type: Patient returns to my office for continued preventative foot care services. Complaint: Patient states" my nails have grown long and thick and become painful to walk and wear shoes" Patient has been diagnosed with DM with no foot complications. The patient presents for preventative foot care services. No changes to ROS  Podiatric Exam: Vascular: dorsalis pedis and posterior tibial pulses are palpable bilateral. Capillary return is immediate. Temperature gradient is WNL. Skin turgor WNL  Sensorium: Normal Semmes Weinstein monofilament test. Normal tactile sensation bilaterally. Nail Exam: Pt has thick disfigured discolored nails with subungual debris noted bilateral entire nail hallux through fifth toenails Ulcer Exam: There is no evidence of ulcer or pre-ulcerative changes or infection. Orthopedic Exam: Muscle tone and strength are WNL. No limitations in general ROM. No crepitus or effusions noted. Foot type and digits show no abnormalities. Bony prominences are unremarkable. Skin: No Porokeratosis. No infection or ulcers  Diagnosis:  Onychomycosis, , Pain in right toe, pain in left toes  Treatment & Plan Procedures and Treatment: Consent by patient was obtained for treatment procedures.   Debridement of mycotic and hypertrophic toenails, 1 through 5 bilateral and clearing of subungual debris. No ulceration, no infection noted.  Return Visit-Office Procedure: Patient instructed to return to the office for a follow up visit 4 months for continued evaluation and treatment.    Jacub Waiters DPM 

## 2017-11-10 ENCOUNTER — Other Ambulatory Visit: Payer: Self-pay | Admitting: Otolaryngology

## 2017-11-10 DIAGNOSIS — H9211 Otorrhea, right ear: Secondary | ICD-10-CM

## 2017-11-10 DIAGNOSIS — R42 Dizziness and giddiness: Secondary | ICD-10-CM

## 2017-11-13 ENCOUNTER — Ambulatory Visit
Admission: RE | Admit: 2017-11-13 | Discharge: 2017-11-13 | Disposition: A | Discharge status: Home / Self Care | Payer: Medicare Other | Source: Ambulatory Visit | Attending: Otolaryngology | Admitting: Otolaryngology

## 2017-11-13 DIAGNOSIS — H6691 Otitis media, unspecified, right ear: Secondary | ICD-10-CM | POA: Diagnosis not present

## 2017-11-13 DIAGNOSIS — R42 Dizziness and giddiness: Secondary | ICD-10-CM | POA: Diagnosis not present

## 2017-11-13 DIAGNOSIS — H9211 Otorrhea, right ear: Secondary | ICD-10-CM | POA: Diagnosis not present

## 2017-12-23 DIAGNOSIS — H7121 Cholesteatoma of mastoid, right ear: Secondary | ICD-10-CM | POA: Diagnosis not present

## 2017-12-24 ENCOUNTER — Telehealth: Payer: Self-pay | Admitting: *Deleted

## 2017-12-24 DIAGNOSIS — H7191 Unspecified cholesteatoma, right ear: Secondary | ICD-10-CM

## 2017-12-24 MED ORDER — SERTRALINE HCL 25 MG PO TABS: 25.0000 mg | ORAL_TABLET | Freq: Every day | ORAL | 2 refills | Status: DC

## 2017-12-24 NOTE — Telephone Encounter (Signed)
Fax placed on your desk.

## 2017-12-24 NOTE — Telephone Encounter (Signed)
Copied from CRM [id number]. Topic: Referral - Request >> Dec 24, 2017 10:15 AM Trula SladeWalter, Linda F wrote: Reason for CRM:   [facility].  Patient stated her PCP needs to talk to Dr. Bud Facereighton Vaught, ENT, [phone] because he has all the referral information.

## 2017-12-24 NOTE — Telephone Encounter (Signed)
Where did you place this?  I do not see this on my desk.

## 2017-12-24 NOTE — Telephone Encounter (Signed)
Can you please contact Dr Vaught's office to see what they need from me? If needed I can speak with Dr Andee PolesVaught.

## 2017-12-24 NOTE — Telephone Encounter (Signed)
Please advise 

## 2017-12-26 NOTE — Telephone Encounter (Signed)
[facility] [phone] option 1  they need referral  from PCP, due DR Vaught's office referred patient for due to patient CSF leak.   This is insurance referral . Also wanted to make sure PCP aware of what was going on .

## 2017-12-26 NOTE — Telephone Encounter (Signed)
I have placed this referral.  Please let Rasheeda know so that she can [facility].  Thanks.

## 2017-12-29 NOTE — Telephone Encounter (Signed)
Spoke with Melissa and she has taken care of referral

## 2018-01-05 ENCOUNTER — Other Ambulatory Visit: Payer: Medicare Other

## 2018-01-14 ENCOUNTER — Telehealth: Payer: Self-pay | Admitting: Radiology

## 2018-01-14 DIAGNOSIS — E119 Type 2 diabetes mellitus without complications: Secondary | ICD-10-CM

## 2018-01-14 NOTE — Addendum Note (Signed)
Addended by: Glori LuisSONNENBERG, Izetta Sakamoto G on: 01/14/2018 06:22 PM   Modules accepted: Orders

## 2018-01-14 NOTE — Telephone Encounter (Signed)
Pt coming in for labs Friday, please place future orders. Thank you.  

## 2018-01-14 NOTE — Telephone Encounter (Signed)
This really needs to be a nurse visit as all she needs is an A1c. The directions were for her to have a visit in the office, though she was scheduled for a lab appointment. I have placed an order.  Please see if we can change this to a nurse visit.

## 2018-01-15 ENCOUNTER — Other Ambulatory Visit: Payer: Medicare Other

## 2018-01-15 DIAGNOSIS — H7191 Unspecified cholesteatoma, right ear: Secondary | ICD-10-CM | POA: Diagnosis not present

## 2018-01-15 DIAGNOSIS — H90A31 Mixed conductive and sensorineural hearing loss, unilateral, right ear with restricted hearing on the contralateral side: Secondary | ICD-10-CM | POA: Diagnosis not present

## 2018-01-15 NOTE — Telephone Encounter (Signed)
Changed appointment to nurse visit

## 2018-01-16 ENCOUNTER — Telehealth: Payer: Self-pay

## 2018-01-16 ENCOUNTER — Ambulatory Visit (INDEPENDENT_AMBULATORY_CARE_PROVIDER_SITE_OTHER): Payer: Medicare Other

## 2018-01-16 ENCOUNTER — Other Ambulatory Visit: Payer: Self-pay | Admitting: Family Medicine

## 2018-01-16 DIAGNOSIS — E119 Type 2 diabetes mellitus without complications: Secondary | ICD-10-CM

## 2018-01-16 DIAGNOSIS — E7800 Pure hypercholesterolemia, unspecified: Secondary | ICD-10-CM

## 2018-01-16 LAB — POCT GLYCOSYLATED HEMOGLOBIN (HGB A1C): Hemoglobin A1C: 7.8 % — AB (ref 4.0–5.6)

## 2018-01-16 NOTE — Telephone Encounter (Signed)
Patient just wanted Laurie Patel to know that she will return for her follow up in October and she will be ready to start on medication at that time if still needed for A1C

## 2018-01-16 NOTE — Progress Notes (Signed)
Patient comes in today for a POCT A1C. Patients A1C today is 7.8.

## 2018-01-16 NOTE — Telephone Encounter (Signed)
Copied from CRM [id number]. Topic: General - Other >> Jan 16, 2018  1:31 PM Darletta MollLander, Lumin L wrote: Reason for CRM: Patient needs a call back re labs.

## 2018-01-19 NOTE — Progress Notes (Signed)
Patient's A1c is improved and closer to goal for age.  I would suggest she continue to work on diet and exercise and we plan to recheck again in 3 months with an office visit with me.

## 2018-01-20 NOTE — Progress Notes (Signed)
Patient notified

## 2018-01-24 ENCOUNTER — Other Ambulatory Visit: Payer: Self-pay | Admitting: Family Medicine

## 2018-01-26 ENCOUNTER — Telehealth: Payer: Self-pay | Admitting: Family Medicine

## 2018-01-26 MED ORDER — METFORMIN HCL 500 MG PO TABS: 500.0000 mg | ORAL_TABLET | Freq: Every day | ORAL | 3 refills | Status: DC

## 2018-01-26 NOTE — Telephone Encounter (Signed)
Copied from CRM [id number]. Topic: General - Other >> Jan 26, 2018  8:28 AM Stephannie LiSimmons, Litha Lamartina L, NT wrote: Reason for CRM: Patient called and said  she will begin the new medication now instead of waiting until October if they are not expensive  please call her at [phone] to discuss

## 2018-01-26 NOTE — Telephone Encounter (Signed)
FYI

## 2018-01-26 NOTE — Telephone Encounter (Signed)
fyi

## 2018-01-26 NOTE — Telephone Encounter (Signed)
Noted. Metformin sent to pharmacy.  

## 2018-02-16 ENCOUNTER — Ambulatory Visit: Payer: Medicare Other | Admitting: Podiatry

## 2018-02-16 ENCOUNTER — Encounter: Payer: Self-pay | Admitting: Podiatry

## 2018-02-16 DIAGNOSIS — R609 Edema, unspecified: Secondary | ICD-10-CM | POA: Insufficient documentation

## 2018-02-16 DIAGNOSIS — M545 Low back pain, unspecified: Secondary | ICD-10-CM | POA: Insufficient documentation

## 2018-02-16 DIAGNOSIS — M79676 Pain in unspecified toe(s): Secondary | ICD-10-CM

## 2018-02-16 DIAGNOSIS — J189 Pneumonia, unspecified organism: Secondary | ICD-10-CM | POA: Insufficient documentation

## 2018-02-16 DIAGNOSIS — L309 Dermatitis, unspecified: Secondary | ICD-10-CM | POA: Insufficient documentation

## 2018-02-16 DIAGNOSIS — B351 Tinea unguium: Secondary | ICD-10-CM

## 2018-02-16 DIAGNOSIS — R195 Other fecal abnormalities: Secondary | ICD-10-CM | POA: Insufficient documentation

## 2018-02-16 DIAGNOSIS — J069 Acute upper respiratory infection, unspecified: Secondary | ICD-10-CM | POA: Insufficient documentation

## 2018-02-16 DIAGNOSIS — R21 Rash and other nonspecific skin eruption: Secondary | ICD-10-CM | POA: Insufficient documentation

## 2018-02-16 DIAGNOSIS — Z8739 Personal history of other diseases of the musculoskeletal system and connective tissue: Secondary | ICD-10-CM | POA: Insufficient documentation

## 2018-02-16 DIAGNOSIS — J209 Acute bronchitis, unspecified: Secondary | ICD-10-CM | POA: Insufficient documentation

## 2018-02-16 DIAGNOSIS — E119 Type 2 diabetes mellitus without complications: Secondary | ICD-10-CM

## 2018-02-16 DIAGNOSIS — I499 Cardiac arrhythmia, unspecified: Secondary | ICD-10-CM | POA: Insufficient documentation

## 2018-02-16 HISTORY — DX: Pneumonia, unspecified organism: J18.9

## 2018-02-16 NOTE — Progress Notes (Addendum)
Complaint:  Visit Type: Patient returns to my office for continued preventative foot care services. Complaint: Patient states" my nails have grown long and thick and become painful to walk and wear shoes" Patient has been diagnosed with DM with no foot complications. The patient presents for preventative foot care services. No changes to ROS  Podiatric Exam: Vascular: dorsalis pedis and posterior tibial pulses are palpable bilateral. Capillary return is immediate. Temperature gradient is WNL. Skin turgor WNL  Sensorium: Normal Semmes Weinstein monofilament test. Normal tactile sensation bilaterally. Nail Exam: Pt has thick disfigured discolored nails with subungual debris noted bilateral entire nail hallux through fifth toenails.  Absence of right hallux toenail noted. Ulcer Exam: There is no evidence of ulcer or pre-ulcerative changes or infection. Orthopedic Exam: Muscle tone and strength are WNL. No limitations in general ROM. No crepitus or effusions noted. Foot type and digits show no abnormalities. Bony prominences are unremarkable. Skin: No Porokeratosis. No infection or ulcers  Diagnosis:  Onychomycosis, , Pain in right toe, pain in left toes  Treatment & Plan Procedures and Treatment: Consent by patient was obtained for treatment procedures.   Debridement of mycotic and hypertrophic toenails, 1 through 5 bilateral and clearing of subungual debris. No ulceration, no infection noted.  Return Visit-Office Procedure: Patient instructed to return to the office for a follow up visit 10 weeks  for continued evaluation and treatment.    Helane GuntherGregory Casaundra Takacs DPM

## 2018-03-05 DIAGNOSIS — H6120 Impacted cerumen, unspecified ear: Secondary | ICD-10-CM | POA: Diagnosis not present

## 2018-03-09 ENCOUNTER — Ambulatory Visit: Payer: Medicare Other | Admitting: Podiatry

## 2018-04-13 ENCOUNTER — Telehealth: Payer: Self-pay | Admitting: Family Medicine

## 2018-04-15 ENCOUNTER — Ambulatory Visit: Payer: Medicare Other | Admitting: Family Medicine

## 2018-04-23 ENCOUNTER — Other Ambulatory Visit: Payer: Self-pay | Admitting: Family Medicine

## 2018-04-27 ENCOUNTER — Encounter: Payer: Self-pay | Admitting: Podiatry

## 2018-04-27 ENCOUNTER — Ambulatory Visit: Payer: Medicare Other | Admitting: Podiatry

## 2018-04-27 DIAGNOSIS — E119 Type 2 diabetes mellitus without complications: Secondary | ICD-10-CM

## 2018-04-27 DIAGNOSIS — M79676 Pain in unspecified toe(s): Secondary | ICD-10-CM | POA: Diagnosis not present

## 2018-04-27 DIAGNOSIS — B351 Tinea unguium: Secondary | ICD-10-CM

## 2018-04-27 NOTE — Progress Notes (Signed)
Complaint:  Visit Type: Patient returns to my office for continued preventative foot care services. Complaint: Patient states" my nails have grown long and thick and become painful to walk and wear shoes" Patient has been diagnosed with DM with no foot complications. The patient presents for preventative foot care services. No changes to ROS  Podiatric Exam: Vascular: dorsalis pedis and posterior tibial pulses are palpable bilateral. Capillary return is immediate. Temperature gradient is WNL. Skin turgor WNL  Sensorium: Normal Semmes Weinstein monofilament test. Normal tactile sensation bilaterally. Nail Exam: Pt has thick disfigured discolored nails with subungual debris noted bilateral entire nail hallux through fifth toenails.  Absence of right hallux toenail noted. Ulcer Exam: There is no evidence of ulcer or pre-ulcerative changes or infection. Orthopedic Exam: Muscle tone and strength are WNL. No limitations in general ROM. No crepitus or effusions noted. Foot type and digits show no abnormalities. Bony prominences are unremarkable. Skin: No Porokeratosis. No infection or ulcers  Diagnosis:  Onychomycosis, , Pain in right toe, pain in left toes  Treatment & Plan Procedures and Treatment: Consent by patient was obtained for treatment procedures.   Debridement of mycotic and hypertrophic toenails, 1 through 5 bilateral and clearing of subungual debris. No ulceration, no infection noted.  Return Visit-Office Procedure: Patient instructed to return to the office for a follow up visit 10 weeks  for continued evaluation and treatment.    Jonah Gingras DPM 

## 2018-05-05 ENCOUNTER — Ambulatory Visit: Payer: Medicare Other | Admitting: Family Medicine

## 2018-05-12 ENCOUNTER — Ambulatory Visit (INDEPENDENT_AMBULATORY_CARE_PROVIDER_SITE_OTHER): Payer: Medicare Other | Admitting: Family Medicine

## 2018-05-12 ENCOUNTER — Encounter: Payer: Self-pay | Admitting: Family Medicine

## 2018-05-12 DIAGNOSIS — I10 Essential (primary) hypertension: Secondary | ICD-10-CM | POA: Diagnosis not present

## 2018-05-12 DIAGNOSIS — E119 Type 2 diabetes mellitus without complications: Secondary | ICD-10-CM

## 2018-05-12 DIAGNOSIS — H9201 Otalgia, right ear: Secondary | ICD-10-CM | POA: Insufficient documentation

## 2018-05-12 LAB — BASIC METABOLIC PANEL
BUN: 10 mg/dL (ref 6–23)
CALCIUM: 9.6 mg/dL (ref 8.4–10.5)
CHLORIDE: 98 meq/L (ref 96–112)
CO2: 30 meq/L (ref 19–32)
CREATININE: 0.7 mg/dL (ref 0.40–1.20)
GFR: 83.8 mL/min (ref >60.00)
GLUCOSE: 265 mg/dL — AB (ref 70–99)
Potassium: 4.2 mEq/L (ref 3.5–5.1)
Sodium: 137 mEq/L (ref 135–145)

## 2018-05-12 LAB — HEMOGLOBIN A1C: Hgb A1c MFr Bld: 8.4 % — ABNORMAL HIGH (ref 4.6–6.5)

## 2018-05-12 NOTE — Assessment & Plan Note (Addendum)
Patient has had intermittent issues with this for months now.  She has been evaluated by two ENT physicians.  She continues to have intermittent symptoms.  Very mild swelling and erythema of the ear canal though patient reports her symptoms have not worsened.  We were unable to see the right TM after irrigation.  She did tolerate irrigation well.  She will contact her ENT physician today to see about setting up an appointment.  She will not use any eardrops at this time as we were unable to visualize her tympanic membrane.  I do not believe she needs acute treatment given the chronicity of this issue.  She is given return precautions.

## 2018-05-12 NOTE — Patient Instructions (Signed)
Nice to see you. Please contact ENT for follow-up. We will check lab work today and contact you with results.

## 2018-05-12 NOTE — Progress Notes (Signed)
Marikay AlarEric Lateef Juncaj, MD Phone: [phone]  Renee PainBarbara S Fantini is a 84 y.o. female who presents today for f/u.  CC: htn, dm, right ear issue  Hypertension: Not checking blood pressures at home.  Taking lisinopril.  No chest pain or edema.  She does note some dyspnea if she walks a mile and this is stable for some time.  No orthopnea or PND.  No lightheadedness.  Diabetes: Not checking sugars.  No polyuria or polydipsia.  She is changed her diet.  Getting more protein and whole wheat.  Not much sugar.  She exercises by tracing her dogs around.  Metformin upset her stomach and caused increased urination.  Right ear issue: Patient notes she continues to have issues with this.  She saw ENT locally who felt she may have a hole in her ear with fluid coming out.  She subsequently saw [facility] and they had to evacuate a scab and cerumen.  They did not note any holes in her TM.  They saw no fluid.  She notes she did okay for a little while though her right ear continued to drain.  She does note her right sinus and nose is stopped up most of the time.  She will get blood out of her nose if she blows it.  She has not followed up with the ENT recently.  She does have some discomfort in her right ear.  She was previously treated with 2 courses of antibiotics.  Social History   Tobacco Use  Smoking Status Never Smoker  Smokeless Tobacco Never Used     ROS see history of present illness  Objective  Physical Exam Vitals:   05/12/18 0916  BP: (!) 140/50  Pulse: 84  Temp: 97.9 F (36.6 C)  SpO2: 96%    BP Readings from Last 3 Encounters:  05/12/18 (!) 140/50  10/14/17 (!) 150/60  08/28/17 (!) 160/70   Wt Readings from Last 3 Encounters:  05/12/18 167 lb 3.2 oz (75.8 kg)  10/14/17 168 lb (76.2 kg)  08/28/17 168 lb 2 oz (76.3 kg)    Physical Exam  Constitutional: No distress.  HENT:  Right ear canal with minimal swelling and erythema, cerumen obscuring the TM, left ear canal and TM normal    Cardiovascular: Normal rate, regular rhythm and normal heart sounds.  Pulmonary/Chest: Effort normal and breath sounds normal.  Musculoskeletal: She exhibits no edema.  Neurological: She is alert.  Skin: Skin is warm and dry. She is not diaphoretic.     Assessment/Plan: Please see individual problem list.  HTN (hypertension) Stable.  Continue current medication.  Check lab work.  Diabetes (HCC) Check A1c.  Ear discomfort, right Patient has had intermittent issues with this for months now.  She has been evaluated by two ENT physicians.  She continues to have intermittent symptoms.  Very mild swelling and erythema of the ear canal though patient reports her symptoms have not worsened.  We were unable to see the right TM after irrigation.  She did tolerate irrigation well.  She will contact her ENT physician today to see about setting up an appointment.  She will not use any eardrops at this time as we were unable to visualize her tympanic membrane.  I do not believe she needs acute treatment given the chronicity of this issue.  She is given return precautions.    Orders Placed This Encounter  Procedures  . Basic Metabolic Panel (BMET)  . HgB A1c    No orders of  the defined types were placed in this encounter.    Tommi Rumps, MD [place]

## 2018-05-12 NOTE — Assessment & Plan Note (Signed)
Stable.  Continue current medication.  Check lab work.

## 2018-05-12 NOTE — Assessment & Plan Note (Signed)
Check A1c. 

## 2018-05-14 ENCOUNTER — Other Ambulatory Visit: Payer: Self-pay | Admitting: Family Medicine

## 2018-05-14 MED ORDER — EMPAGLIFLOZIN 10 MG PO TABS: 10.0000 mg | ORAL_TABLET | Freq: Every day | ORAL | 2 refills | Status: DC

## 2018-05-15 ENCOUNTER — Ambulatory Visit: Payer: Self-pay | Admitting: *Deleted

## 2018-05-15 NOTE — Telephone Encounter (Signed)
Pt was prescribed empagliflozin (JARDIANCE) 10 MG TABS tablet. Pt went to the pharmacy and it is going to be too expensive for her to get. Pt wants to let doctor know she will not be taking this medication and wants to know if there is anything else that she can have that is comparable - preferably in generic. [facility] [address] [place] [id number] [phone] (Phone) [phone] (Fax); pt last seen by Dr Birdie SonsSonnenberg 05/12/18; will route to office for final disposition.   Reason for Disposition . Caller requesting a NON-URGENT new prescription or refill and triager unable to refill per unit policy  Answer Assessment - Initial Assessment Questions 1. SYMPTOMS: "Do you have any symptoms?"     n/a 2. SEVERITY: If symptoms are present, ask "Are they mild, moderate or severe?"     n/a  Protocols used: MEDICATION QUESTION CALL-A-AH

## 2018-05-25 ENCOUNTER — Ambulatory Visit: Payer: Medicare Other | Admitting: Cardiovascular Disease

## 2018-06-29 ENCOUNTER — Ambulatory Visit: Payer: Medicare Other

## 2018-07-02 ENCOUNTER — Telehealth: Payer: Self-pay

## 2018-07-02 ENCOUNTER — Ambulatory Visit (INDEPENDENT_AMBULATORY_CARE_PROVIDER_SITE_OTHER): Payer: Medicare Other

## 2018-07-02 VITALS — BP 142/72 | HR 86 | Temp 98.1°F | Resp 16 | Ht 65.0 in | Wt 165.1 lb

## 2018-07-02 DIAGNOSIS — Z0000 Encounter for general adult medical examination without abnormal findings: Secondary | ICD-10-CM

## 2018-07-02 NOTE — Progress Notes (Signed)
Subjective:   Renee PainBarbara S Fahrner is a 85 y.o. female who presents for Medicare Annual (Subsequent) preventive examination.  Review of Systems:  No ROS.  Medicare Wellness Visit. Additional risk factors are reflected in the social history.  Cardiac Risk Factors include: advanced age (>1455men, 18>65 women);diabetes mellitus;hypertension     Objective:     Vitals: BP (!) 142/72 (BP Location: Left Arm, Patient Position: Sitting, Cuff Size: Normal)   Pulse 86   Temp 98.1 F (36.7 C) (Oral)   Resp 16   Ht 5\' 5"  (1.651 m)   Wt 165 lb 1.9 oz (74.9 kg)   SpO2 95%   BMI 27.48 kg/m   Body mass index is 27.48 kg/m.  Advanced Directives 07/02/2018 06/26/2017 03/05/2017  Does Patient Have a Medical Advance Directive? No No No  Does patient want to make changes to medical advance directive? No - Patient declined - -  Would patient like information on creating a medical advance directive? - No - Patient declined -    Tobacco Social History   Tobacco Use  Smoking Status Never Smoker  Smokeless Tobacco Never Used     Counseling given: Not Answered   Clinical Intake:  Pre-visit preparation completed: Yes  Pain : No/denies pain     Diabetes: Yes(Followed by pcp)  How often do you need to have someone help you when you read instructions, pamphlets, or other written materials from your doctor or pharmacy?: 1 - Never  Interpreter Needed?: No     Past Medical History:  Diagnosis Date  . Heart murmur   . PNA (pneumonia) 02/16/2018   Past Surgical History:  Procedure Laterality Date  . APPENDECTOMY  1986  . EYE SURGERY Bilateral 1995 and 1999   Lens implants  . WRIST SURGERY Left 1986   ORIF    Family History  Problem Relation Age of Onset  . Cancer Mother 5473       Breast Cancer  . Heart disease Father 5083       Congestive Heart Failure  . COPD Father 683       Emphysema  . Heart disease Brother 4778       massive heart attack  . Heart disease Maternal Grandmother   . Heart  disease Maternal Grandfather   . Early death Paternal Grandmother        Early death unsure age  . Heart disease Paternal Grandfather   . Obesity Paternal Grandfather    Social History   Socioeconomic History  . Marital status: Widowed    Spouse name: Not on file  . Number of children: Not on file  . Years of education: Not on file  . Highest education level: Not on file  Occupational History  . Not on file  Social Needs  . Financial resource strain: Not very hard  . Food insecurity:    Worry: Never true    Inability: Never true  . Transportation needs:    Medical: No    Non-medical: No  Tobacco Use  . Smoking status: Never Smoker  . Smokeless tobacco: Never Used  Substance and Sexual Activity  . Alcohol use: No    Alcohol/week: 0.0 standard drinks  . Drug use: No  . Sexual activity: Never  Lifestyle  . Physical activity:    Days per week: 0 days    Minutes per session: Not on file  . Stress: Not on file  Relationships  . Social connections:    Talks on phone: Not  on file    Gets together: Not on file    Attends religious service: Not on file    Active member of club or organization: Not on file    Attends meetings of clubs or organizations: Not on file    Relationship status: Widowed  Other Topics Concern  . Not on file  Social History Narrative   [place] in [place]    Owns a [employer]    1 dog      Activities of Daily Living In your present state of health, do you have any difficulty performing the following activities: 07/02/2018  Hearing? Y  Comment HOH R ear  Vision? N  Difficulty concentrating or making decisions? N  Walking or climbing stairs? Y  Comment Unsteady gait  Dressing or bathing? N  Doing errands, shopping? Y  Comment She does not Engineer, manufacturingdrive  Preparing Food and eating ? N  Using the Toilet? N  In the past six months, have you accidently leaked urine? N  Do you have problems with loss of bowel control? N  Managing your Medications?  N  Managing your Finances? N  Housekeeping or managing your Housekeeping? N  Some recent data might be hidden    Patient Care Team: Glori LuisSonnenberg, Eric G, MD as PCP - General (Family Medicine)    Assessment:   This is a routine wellness examination for WhitfieldBarbara.  Not taking Jardiance due to cost and fear of upsetting her stomach. She is willing to begin the medication but would like a lower dose to start. Eating a high protein diet. States she feels great and her strength is improving. No longer short of breath or constipated since stopping the metformin. Appointment offered with pcp to follow up on this issue, declined.  Deferred to doctor on call for follow up. Inner ear still bothers her intermittently and interferes with hearing.  Plans to reschedule with [facility] (Dr. Andee PolesVaught).   Health Screenings  Mammogram-08/23/10 Glaucoma -none reported Hearing -difficulty hearing some conversational tones Hemoglobin A1C -05/12/18 (8.4) Cholesterol -10/14/17 (190) Dental- UTD Visual-visits every 12 months  Social  Alcohol intake-none Smoking history-none Smokers in home? none Illicit drug use?none Exercise -walks in place while holding onto something stable. Diet-High protein  Sexually Active -never  Safety  Patient feels safe at home.  Patient does have smoke detectors at home.  Patient does wear sunscreen or protective clothing when in direct sunlight. Patient does wear seat belt when riding with others.  Activities of Daily Living Patient can do their own household chores. Denies needing assistance with: feeding themselves, getting from bed to chair, getting to the toilet, bathing/showering, dressing, managing money, or preparing meals.   Depression Screen Patient denies losing interest in daily life, feeling hopeless, or crying easily over simple problems.   Fall Screen Patient denies being afraid of falling or falling in the last year.   Memory Screen Patient denies problems  with memory, misplacing items, and is able to balance checkbook/bank accounts. Patient is alert, normal appearance, oriented to person/place/and time. Correctly identified the president of the [place], recall of 3/3 objects, and performing simple calculations.  Patient displays appropriate judgement and can read correct time from watch face.   Immunizations The following Immunizations are up to date: Influenza, shingles, pneumonia, and tetanus.   Medications-discussed. Taking as scheduled.  Requests changes regarding Jardiance.  Note forwarded to primary care provided.   Other Providers Patient Care Team: Glori LuisSonnenberg, Eric G, MD as PCP - General (Family Medicine)  Exercise Activities and Dietary recommendations Current Exercise Habits: Home exercise routine, Type of exercise: strength training/weights;stretching, Time (Minutes): 20, Frequency (Times/Week): 7, Weekly Exercise (Minutes/Week): 140, Intensity: Mild  Goals    . Weight (lb) < 165 lb (74.8 kg)       Fall Risk Fall Risk  07/02/2018 06/26/2017 09/19/2016 09/14/2015 09/14/2014  Falls in the past year? 0 Yes No No No  Number falls in past yr: - 1 - - -  Injury with Fall? - Yes - - -  Comment - Followed by PCP - - -   Depression Screen PHQ 2/9 Scores 07/02/2018 06/26/2017 09/19/2016 09/14/2015  PHQ - 2 Score 0 0 0 0  PHQ- 9 Score - 0 - -     Cognitive Function MMSE - Mini Mental State Exam 06/26/2017  Orientation to time 5  Orientation to Place 5  Registration 3  Attention/ Calculation 5  Recall 3  Language- name 2 objects 2  Language- repeat 1  Language- follow 3 step command 3  Language- read & follow direction 1  Write a sentence 1  Copy design 1  Total score 30     6CIT Screen 07/02/2018  What Year? 0 points  What month? 0 points  What time? 0 points  Count back from 20 0 points  Months in reverse 0 points  Repeat phrase 0 points  Total Score 0    Immunization History  Administered Date(s) Administered  . Influenza  Split 02/24/2013, 03/24/2014, 02/29/2016  . Influenza, High Dose Seasonal PF 04/10/2017, 02/14/2018  . Influenza-Unspecified 04/07/2017  . Pneumococcal-Unspecified 03/24/2014  . Td 02/08/2010  . Tdap 06/24/2008   Screening Tests Health Maintenance  Topic Date Due  . PNA vac Low Risk Adult (2 of 2 - PCV13) 03/25/2015  . OPHTHALMOLOGY EXAM  10/11/2018  . FOOT EXAM  10/15/2018  . HEMOGLOBIN A1C  11/10/2018  . TETANUS/TDAP  02/09/2020  . INFLUENZA VACCINE  Completed  . DEXA SCAN  Completed      Plan:    End of life planning; Advance aging; Advanced directives discussed. Copy of current HCPOA/Living Will requested.    I have personally reviewed and noted the following in the patient's chart:   . Medical and social history . Use of alcohol, tobacco or illicit drugs  . Current medications and supplements . Functional ability and status . Nutritional status . Physical activity . Advanced directives . List of other physicians . Hospitalizations, surgeries, and ER visits in previous 12 months . Vitals . Screenings to include cognitive, depression, and falls . Referrals and appointments  In addition, I have reviewed and discussed with patient certain preventive protocols, quality metrics, and best practice recommendations. A written personalized care plan for preventive services as well as general preventive health recommendations were provided to patient.     Ashok Pall, LPN  2/87/6811

## 2018-07-02 NOTE — Patient Instructions (Addendum)
  Laurie Patel , Thank you for taking time to come for your Medicare Wellness Visit. I appreciate your ongoing commitment to your health goals. Please review the following plan we discussed and let me know if I can assist you in the future.   Follow up as needed.    Bring a copy of your Health Care Power of Attorney and/or Living Will to be scanned into chart upon completion.  Have a great day!  These are the goals we discussed: Goals    . Weight (lb) < 165 lb (74.8 kg)       This is a list of the screening recommended for you and due dates:  Health Maintenance  Topic Date Due  . Pneumonia vaccines (2 of 2 - PCV13) 03/25/2015  . Eye exam for diabetics  10/11/2018  . Complete foot exam   10/15/2018  . Hemoglobin A1C  11/10/2018  . Tetanus Vaccine  02/09/2020  . Flu Shot  Completed  . DEXA scan (bone density measurement)  Completed

## 2018-07-02 NOTE — Telephone Encounter (Signed)
Copied from CRM [id number]. Topic: General - Other >> Jul 02, 2018  1:47 PM Gerrianne Scale wrote: Reason for CRM: Pt calling to speak with the AWV nurse Denisa about medicine that she went to the pharmacy to find out information on please give her a call at [phone]

## 2018-07-02 NOTE — Telephone Encounter (Signed)
Patient called and stated she spoke with her pharmacist Morton Peters) at [facility] regarding medication Jardiance side effects and cost.  Information provided could upset her GI tract similar to Metformin previously taken and cost $45 per month. She will hold off on starting this medication until receiving feedback from pcp. She requests a new medication with lower cost and less side effect or a lower dose of Jardiance.  [facility] medication management phone number [phone] provided by me for possible financial assistance. Please notify patient or pharmacy of how you would like to proceed.

## 2018-07-02 NOTE — Telephone Encounter (Signed)
Looking into the patient's [employer] coverage:   London Pepper is preferred Tier 3 medication, copay is $47. The documented incidence of nausea with this medication is 2%. Anecdotally, I have not had a patient have issues with GI upset with any agents in this class. Any preferred SGLT2, GLP1, DPP4, or insulin will be Tier 3, therefore $47/30 days.   Metformin is Tier 1, copay is $2 (does not differentiate between ER or IR, so I'm unsure if this is just IR or both). Documented incidence of nausea with this medication is 26% with IR, 7% with ER.   The least favorable option clinically would be glipizide. I would be very cautious with this agent, given patient's age and risk of hypoglycemia. It is Tier 1, copay $2.  You could give her Jardiance samples? She could try that, or try splitting like Lauren Guse suggested in the visit note, and see if she tolerates it. After that, we could pursue [employer] patient assistance as a long term solution. [employer] does not publish an income cut off, the only way to know if she would qualify is to apply.   Let me know how I can help.   [name] Feliz Beam, PharmD PGY2 Ambulatory Care Pharmacy Resident, [facility] Phone: [phone]

## 2018-07-02 NOTE — Telephone Encounter (Signed)
Called patient.  No answer. Will follow.

## 2018-07-02 NOTE — Progress Notes (Signed)
Reviewed LPN note  Will defer to PCP in regard to jardiance, suggested it is possible to cut the 10 mg tablet in half with pill splitter if she does not want to take the 10 mg dose  Copied from LPN note: Not taking Jardiance due to cost and fear of upsetting her stomach. She is willing to begin the medication but would like a lower dose to start. Eating a high protein diet. States she feels great and her strength is improving. No longer short of breath or constipated since stopping the metformin. Appointment offered with pcp to follow up on this issue, declined.

## 2018-07-02 NOTE — Telephone Encounter (Signed)
Noted.  I will forward to [name] to see if she can look at the patient's insurance and determine what the cheapest and lowest side effect medication would be.

## 2018-07-05 NOTE — Telephone Encounter (Signed)
Message from patient reviewed.  Typically I do not see any GI issues with the Jardiance.  Most medications will have a similar cost for diabetes based on her insurance plan.  Metformin would be the cheapest and we could trial the extended release version to see if she tolerates that better.  If she does not want to try the extended release metformin we could provide her with Jardiance samples and see if she can tolerate it.  We could then see if the pharmaceutical company would provide patient assistance for her as a long-term solution for the cost.  Please see if she would like to try the Jardiance or if she would like to try the extended release metformin.  Thanks.

## 2018-07-07 ENCOUNTER — Ambulatory Visit: Payer: Self-pay

## 2018-07-07 NOTE — Telephone Encounter (Signed)
I will forward to [name] to look at patient assistance for the patient.

## 2018-07-07 NOTE — Telephone Encounter (Signed)
Noted.  The A1c does not need to be fasting.  Fasting for an A1c test will not affect its result.  If she has any issues taking the Jardiance she should contact us.  Our clinical pharmacist will look into patient assistance for the patient.

## 2018-07-07 NOTE — Telephone Encounter (Signed)
Called patient and left a VM to call back. CRM created and sent to [facility]EC pool.  

## 2018-07-07 NOTE — Telephone Encounter (Signed)
Sent to PCP ?

## 2018-07-07 NOTE — Telephone Encounter (Signed)
Patient called and says that she would like Dr. Birdie Sons know that she has just started taking Jardiance on yesterday and says that if she has problems like with the Metformin, she will stop it. She says that she doesn't think she needs it because she had blood work non-fasting after she's eaten meals. She says she would like to have a fasting blood sugar test whenever she's allowed to have it per medicaid, she says every 3 months. I advised that would be February. She says she would like to have a test in February or at her appointment in May. I advised I will send to Dr. Birdie Sons and someone will call with his recommendation.    Reason for Disposition . [1] Caller requesting NON-URGENT health information AND [2] PCP's office is the best resource  Protocols used: INFORMATION ONLY CALL-A-AH

## 2018-07-07 NOTE — Telephone Encounter (Addendum)
Pt. returned call to receive further recommendations from Dr. Birdie Sons.  Advised of message from 07/05/18 per Dr. Birdie Sons.  The pt. Stated she has no desire to take any form of Metformin.  Stated "it knocked my system down so bad."  Stated she has gone ahead and picked up the Jardiance at the pharmacy, and paid the $47.00 copay.  Stated she will contact the office if she has any problems tolerating it.  Stated she would be interested in getting "patient assistance through the pharmaceutical company", if she is able to continue taking it.  Pt. advised to call if she has any concerns or problems taking the Jardiance.  Verb. understanding.

## 2018-07-08 ENCOUNTER — Telehealth: Payer: Self-pay | Admitting: Pharmacist

## 2018-07-08 NOTE — Telephone Encounter (Signed)
Received message from Dr. Birdie Sons regarding patient assistance for Jardiance for this patient.   Contacted patient to discuss the process for applying for assistance. Left HIPAA compliant message for patient to call me back at her convenience.   [name] Feliz Beam, PharmD PGY2 Ambulatory Care Pharmacy Resident, [facility] Phone: [phone]

## 2018-07-09 NOTE — Telephone Encounter (Signed)
Patient says the London Pepper is causing the same symptoms that the Metformin caused , she is having dizziness, with leg cramps, fatigue, and just feels she cannot go. That was on Tuesday when she took [place] , Wednesday patient took no Jardiance and she started to feel better. Please advise something else she can try.

## 2018-07-09 NOTE — Telephone Encounter (Signed)
Noted.  She should no longer take the Jardiance.  We could try Tradjenta if she is willing.  We could also have her meet with our clinical pharmacist to discuss options for treatment given the patient's issues with side effects.

## 2018-07-13 ENCOUNTER — Ambulatory Visit: Payer: Medicare Other | Admitting: Podiatry

## 2018-07-13 ENCOUNTER — Encounter: Payer: Self-pay | Admitting: Podiatry

## 2018-07-13 DIAGNOSIS — M79676 Pain in unspecified toe(s): Secondary | ICD-10-CM

## 2018-07-13 DIAGNOSIS — E119 Type 2 diabetes mellitus without complications: Secondary | ICD-10-CM | POA: Diagnosis not present

## 2018-07-13 DIAGNOSIS — B351 Tinea unguium: Secondary | ICD-10-CM

## 2018-07-13 NOTE — Telephone Encounter (Signed)
Patient stated she would prefer to try diet changes and she will let us know after the first of the month if she would  like to see the Pharm_D

## 2018-07-13 NOTE — Addendum Note (Signed)
Addended by: Dennie Bible on: 07/13/2018 01:59 PM   Modules accepted: Orders

## 2018-07-13 NOTE — Progress Notes (Signed)
Complaint:  Visit Type: Patient returns to my office for continued preventative foot care services. Complaint: Patient states" my nails have grown long and thick and become painful to walk and wear shoes" Patient has been diagnosed with DM with no foot complications. The patient presents for preventative foot care services. No changes to ROS  Podiatric Exam: Vascular: dorsalis pedis and posterior tibial pulses are palpable bilateral. Capillary return is immediate. Temperature gradient is WNL. Skin turgor WNL  Sensorium: Normal Semmes Weinstein monofilament test. Normal tactile sensation bilaterally. Nail Exam: Pt has thick disfigured discolored nails with subungual debris noted bilateral entire nail hallux through fifth toenails.  Absence of right hallux toenail noted. Ulcer Exam: There is no evidence of ulcer or pre-ulcerative changes or infection. Orthopedic Exam: Muscle tone and strength are WNL. No limitations in general ROM. No crepitus or effusions noted. Hammer toe second right foot. Bony prominences are unremarkable. Skin: No Porokeratosis. No infection or ulcers  Diagnosis:  Onychomycosis, , Pain in right toe, pain in left toes  Treatment & Plan Procedures and Treatment: Consent by patient was obtained for treatment procedures.   Debridement of mycotic and hypertrophic toenails, 1 through 5 bilateral and clearing of subungual debris. No ulceration, no infection noted.  Return Visit-Office Procedure: Patient instructed to return to the office for a follow up visit 10 weeks  for continued evaluation and treatment.    Alysia Scism DPM 

## 2018-07-17 NOTE — Telephone Encounter (Signed)
Attempted to contact patient again, phone went straight to voicemail. Left HIPAA compliant message for patient to return my call at her convenience.   Attempted to contact patient's emergency contact, Wylie Hail. Was unable to leave a message on that number.   [name] Feliz Beam, PharmD PGY2 Ambulatory Care Pharmacy Resident, [facility] Phone: [phone]

## 2018-07-27 NOTE — Telephone Encounter (Signed)
Contacted patient to follow up on diabetes treatment options (see 07/07/2018 nurse triage note) and to see about scheduling an appointment with me in clinic. She noted that she is having hearing aid problems right now, and "doesn't want to deal with anyone else" until that is taken care of.   Encouraged patient to contact clinic to schedule an appointment with me when she is ready.   [name] Feliz Beam, PharmD PGY2 Ambulatory Care Pharmacy Resident, [facility] Phone: [phone]

## 2018-07-28 DIAGNOSIS — H6121 Impacted cerumen, right ear: Secondary | ICD-10-CM | POA: Diagnosis not present

## 2018-07-28 DIAGNOSIS — H908 Mixed conductive and sensorineural hearing loss, unspecified: Secondary | ICD-10-CM | POA: Diagnosis not present

## 2018-08-03 DIAGNOSIS — H903 Sensorineural hearing loss, bilateral: Secondary | ICD-10-CM | POA: Diagnosis not present

## 2018-08-17 ENCOUNTER — Telehealth: Payer: Self-pay | Admitting: Family Medicine

## 2018-08-17 NOTE — Telephone Encounter (Signed)
Copied from [place] [id number]. Topic: Quick Communication - See Telephone Encounter >> Aug 17, 2018  3:22 PM Rutherford Nail, [place] wrote: CRM for notification. See Telephone encounter for: 08/17/18. Patient would like to know if Dr [name] would be willing to send a prescription for a blood glucose kit to the pharmacy? Please advise. [place], [place]

## 2018-08-21 MED ORDER — BLOOD GLUCOSE MONITOR KIT: PACK | 0 refills | Status: DC

## 2018-08-21 NOTE — Telephone Encounter (Signed)
Printed.  Please fax.

## 2018-08-21 NOTE — Addendum Note (Signed)
Addended by: Glori Luis on: 08/21/2018 06:43 PM   Modules accepted: Orders

## 2018-08-24 ENCOUNTER — Telehealth: Payer: Self-pay

## 2018-08-24 NOTE — Telephone Encounter (Signed)
A RX for a blood glucose meter was faxed today to [facility] per the patient's request.  Shagun Wordell,CMA

## 2018-09-21 ENCOUNTER — Ambulatory Visit: Payer: Medicare Other | Admitting: Podiatry

## 2018-10-19 ENCOUNTER — Encounter: Payer: Self-pay | Admitting: Podiatry

## 2018-10-19 ENCOUNTER — Other Ambulatory Visit: Payer: Self-pay

## 2018-10-19 ENCOUNTER — Ambulatory Visit: Payer: Medicare Other | Admitting: Podiatry

## 2018-10-19 VITALS — Temp 98.6°F

## 2018-10-19 DIAGNOSIS — M79676 Pain in unspecified toe(s): Secondary | ICD-10-CM | POA: Diagnosis not present

## 2018-10-19 DIAGNOSIS — E119 Type 2 diabetes mellitus without complications: Secondary | ICD-10-CM | POA: Diagnosis not present

## 2018-10-19 DIAGNOSIS — B351 Tinea unguium: Secondary | ICD-10-CM

## 2018-10-19 NOTE — Progress Notes (Signed)
Complaint:  Visit Type: Patient returns to my office for continued preventative foot care services. Complaint: Patient states" my nails have grown long and thick and become painful to walk and wear shoes" Patient has been diagnosed with DM with no foot complications. The patient presents for preventative foot care services. No changes to ROS  Podiatric Exam: Vascular: dorsalis pedis and posterior tibial pulses are palpable bilateral. Capillary return is immediate. Temperature gradient is WNL. Skin turgor WNL  Sensorium: Normal Semmes Weinstein monofilament test. Normal tactile sensation bilaterally. Nail Exam: Pt has thick disfigured discolored nails with subungual debris noted bilateral entire nail hallux through fifth toenails.  Absence of right hallux toenail noted. Ulcer Exam: There is no evidence of ulcer or pre-ulcerative changes or infection. Orthopedic Exam: Muscle tone and strength are WNL. No limitations in general ROM. No crepitus or effusions noted. Hammer toe second right foot. Bony prominences are unremarkable. Skin: No Porokeratosis. No infection or ulcers  Diagnosis:  Onychomycosis, , Pain in right toe, pain in left toes  Treatment & Plan Procedures and Treatment: Consent by patient was obtained for treatment procedures.   Debridement of mycotic and hypertrophic toenails, 1 through 5 bilateral and clearing of subungual debris. No ulceration, no infection noted.  Return Visit-Office Procedure: Patient instructed to return to the office for a follow up visit 10 weeks  for continued evaluation and treatment.    Amity Roes DPM 

## 2018-10-27 DIAGNOSIS — E119 Type 2 diabetes mellitus without complications: Secondary | ICD-10-CM | POA: Diagnosis not present

## 2018-10-27 LAB — HM DIABETES EYE EXAM

## 2018-11-01 ENCOUNTER — Other Ambulatory Visit: Payer: Self-pay | Admitting: Family Medicine

## 2018-11-02 ENCOUNTER — Other Ambulatory Visit: Payer: Self-pay

## 2018-11-02 ENCOUNTER — Telehealth: Payer: Self-pay | Admitting: Family Medicine

## 2018-11-02 DIAGNOSIS — I10 Essential (primary) hypertension: Secondary | ICD-10-CM

## 2018-11-02 NOTE — Telephone Encounter (Signed)
A refill  for lisinopril was sent to pt's pharmacy, 30 tablets with no refills note on Rx states pt should keep appointment. She has appt on 11/23/2018.  [facility], [facility].  Zeki Bedrosian,cma

## 2018-11-02 NOTE — Telephone Encounter (Signed)
Copied from CRM [id number]. Topic: Quick Communication - Rx Refill/Question >> Nov 02, 2018  8:29 AM Retta Diones wrote: Medication: lisinopril (PRINIVIL,ZESTRIL) 5 MG tablet  Has the patient contacted their pharmacy? Yes.   (Agent: If no, request that the patient contact the pharmacy for the refill.) (Agent: If yes, when and what did the pharmacy advise?)  Preferred Pharmacy (with phone number or street name): [facility] [address] - [place], [place] - [address] [phone] (Phone) [phone] (Fax    Agent: Please be advised that RX refills may take up to 3 business days. We ask that you follow-up with your pharmacy.

## 2018-11-03 MED ORDER — LISINOPRIL 5 MG PO TABS: ORAL_TABLET | ORAL | 1 refills | Status: DC

## 2018-11-03 NOTE — Telephone Encounter (Signed)
Called the pt and informed her that her medication lisinopril was sent to pharmacy.  Nina,cma

## 2018-11-12 ENCOUNTER — Telehealth: Payer: Self-pay

## 2018-11-12 NOTE — Telephone Encounter (Signed)
Copied from CRM [id number]. Topic: Appointment Scheduling - Scheduling Inquiry for Clinic >> Nov 11, 2018  6:05 PM Debroah Loop wrote: Reason for CRM: Patient would like to cancel appt on 11/23/2018

## 2018-11-13 ENCOUNTER — Ambulatory Visit: Payer: Medicare Other | Admitting: Family Medicine

## 2018-11-23 ENCOUNTER — Ambulatory Visit: Payer: Medicare Other | Admitting: Family Medicine

## 2018-11-26 ENCOUNTER — Other Ambulatory Visit: Payer: Self-pay | Admitting: Family Medicine

## 2018-12-15 ENCOUNTER — Other Ambulatory Visit: Payer: Self-pay

## 2018-12-15 ENCOUNTER — Encounter: Payer: Self-pay | Admitting: Family Medicine

## 2018-12-15 ENCOUNTER — Ambulatory Visit (INDEPENDENT_AMBULATORY_CARE_PROVIDER_SITE_OTHER): Payer: Medicare Other | Admitting: Family Medicine

## 2018-12-15 VITALS — BP 148/57 | HR 73 | Wt 159.0 lb

## 2018-12-15 DIAGNOSIS — M810 Age-related osteoporosis without current pathological fracture: Secondary | ICD-10-CM | POA: Diagnosis not present

## 2018-12-15 DIAGNOSIS — E119 Type 2 diabetes mellitus without complications: Secondary | ICD-10-CM

## 2018-12-15 DIAGNOSIS — I10 Essential (primary) hypertension: Secondary | ICD-10-CM

## 2018-12-15 DIAGNOSIS — K047 Periapical abscess without sinus: Secondary | ICD-10-CM | POA: Diagnosis not present

## 2018-12-15 NOTE — Progress Notes (Signed)
Virtual Visit via telephone Note  This visit type was conducted due to national recommendations for restrictions regarding the COVID-19 pandemic (e.g. social distancing).  This format is felt to be most appropriate for this patient at this time.  All issues noted in this document were discussed and addressed.  No physical exam was performed (except for noted visual exam findings with Video Visits).   I connected with Laurie Patel today at  1:45 PM EDT by telephone and verified that I am speaking with the correct person using two identifiers. Location patient: home Location provider: work  Persons participating in the virtual visit: patient, provider  I discussed the limitations, risks, security and privacy concerns of performing an evaluation and management service by telephone and the availability of in person appointments. I also discussed with the patient that there may be a patient responsible charge related to this service. The patient expressed understanding and agreed to proceed.  Interactive audio and video telecommunications were attempted between this provider and patient, however failed, due to patient having technical difficulties OR patient did not have access to video capability.  We continued and completed visit with audio only.  Reason for visit: follow-up  HPI: HYPERTENSION  Disease Monitoring  Home BP Monitoring 140s/50  Chest pain- no    Dyspnea- no Medications  Compliance-  Taking lisinopril.  Edema- no  DIABETES Disease Monitoring: Blood Sugar ranges-not checking Polyuria/phagia/dipsia- no      Optho- UTD Medications: Compliance- no medications  Has been exercising with walking and watching her diet with decreased carbs.   Dental infection: seen by dentist and treated with penicillin. She had a tooth pulled and has been doing well with this.  She is had no recurrent infection.  She notes the penicillin did seem to clear up her sinuses.  Osteoporosis: she notes  her dentist discussed doing a bone graft for her jaw and noted that she should have a DEXA scan. It has been almost 20 years since her last DEXA scan.        ROS: See pertinent positives and negatives per HPI.  Past Medical History:  Diagnosis Date  . Ankle fracture 10/14/2017  . Heart murmur   . PNA (pneumonia) 02/16/2018    Past Surgical History:  Procedure Laterality Date  . APPENDECTOMY  1986  . EYE SURGERY Bilateral 1995 and 1999   Lens implants  . WRIST SURGERY Left 1986   ORIF     Family History  Problem Relation Age of Onset  . Cancer Mother 40       Breast Cancer  . Heart disease Father 16       Congestive Heart Failure  . COPD Father 62       Emphysema  . Heart disease Brother 9       massive heart attack  . Heart disease Maternal Grandmother   . Heart disease Maternal Grandfather   . Early death Paternal Grandmother        Early death unsure age  . Heart disease Paternal Grandfather   . Obesity Paternal Grandfather     SOCIAL HX: Non-smoker.   Current Outpatient Medications:  .  aspirin 81 MG EC tablet, Take 81 mg by mouth daily. Swallow whole., Disp: , Rfl:  .  blood glucose meter kit and supplies KIT, Dispense based on patient and insurance preference. Use once daily. E11.9., Disp: 1 each, Rfl: 0 .  Influenza vac split quadrivalent PF (FLUZONE HIGH-DOSE) 0.5 ML injection, Fluzone High-Dose 2017-2018 (  PF) 180 mcg/0.5 mL intramuscular syringe, Disp: , Rfl:  .  Lancets (ONETOUCH DELICA PLUS [id number]) MISC, USE ONE DAILY, Disp: 100 each, Rfl: 0 .  lisinopril (ZESTRIL) 5 MG tablet, TAKE ONE TABLET BY MOUTH DAILY *MUST KEEP APPOINTMENT FOR FURTHER REFILLS*, Disp: 90 tablet, Rfl: 1 .  Multiple Vitamins-Calcium (ONE-A-DAY WOMENS PO), Take 1 tablet by mouth daily., Disp: , Rfl:   EXAM: This was a telehealth telephone visit notes no physical exam was completed.  ASSESSMENT AND PLAN:  Discussed the following assessment and plan:  HTN (hypertension)  Adequately controlled.  We will have her in for lab work.  Diabetes ([place]) She will come in for labs.  Continue diet and exercise.  OP (osteoporosis) DEXA scan ordered.  She will call to schedule this.  Dental infection Resolved.  She will monitor for recurrence.  Social distancing precautions and sick precautions given regarding COVID-19.   I discussed the assessment and treatment plan with the patient. The patient was provided an opportunity to ask questions and all were answered. The patient agreed with the plan and demonstrated an understanding of the instructions.   The patient was advised to call back or seek an in-person evaluation if the symptoms worsen or if the condition fails to improve as anticipated.  I provided 18 minutes of non-face-to-face time during this encounter.   Tommi Rumps, MD

## 2018-12-15 NOTE — Assessment & Plan Note (Signed)
Adequately controlled.  We will have her in for lab work.

## 2018-12-15 NOTE — Assessment & Plan Note (Signed)
DEXA scan ordered.  She will call to schedule this.

## 2018-12-15 NOTE — Assessment & Plan Note (Signed)
Resolved.  She will monitor for recurrence. 

## 2018-12-15 NOTE — Assessment & Plan Note (Signed)
She will come in for labs.  Continue diet and exercise.

## 2019-01-18 ENCOUNTER — Ambulatory Visit: Payer: Medicare Other | Admitting: Podiatry

## 2019-01-18 ENCOUNTER — Encounter: Payer: Self-pay | Admitting: Podiatry

## 2019-01-18 ENCOUNTER — Other Ambulatory Visit (INDEPENDENT_AMBULATORY_CARE_PROVIDER_SITE_OTHER): Payer: Medicare Other

## 2019-01-18 ENCOUNTER — Other Ambulatory Visit: Payer: Self-pay

## 2019-01-18 VITALS — Temp 97.4°F

## 2019-01-18 DIAGNOSIS — E119 Type 2 diabetes mellitus without complications: Secondary | ICD-10-CM

## 2019-01-18 DIAGNOSIS — I10 Essential (primary) hypertension: Secondary | ICD-10-CM

## 2019-01-18 DIAGNOSIS — M79676 Pain in unspecified toe(s): Secondary | ICD-10-CM | POA: Diagnosis not present

## 2019-01-18 DIAGNOSIS — B351 Tinea unguium: Secondary | ICD-10-CM

## 2019-01-18 LAB — COMPREHENSIVE METABOLIC PANEL
ALT: 14 U/L (ref 0–35)
AST: 14 U/L (ref 0–37)
Albumin: 4.6 g/dL (ref 3.5–5.2)
Alkaline Phosphatase: 83 U/L (ref 39–117)
BUN: 10 mg/dL (ref 6–23)
CO2: 30 mEq/L (ref 19–32)
Calcium: 10 mg/dL (ref 8.4–10.5)
Chloride: 100 mEq/L (ref 96–112)
Creatinine, Ser: 0.7 mg/dL (ref 0.40–1.20)
GFR: 78.72 mL/min (ref >60.00)
Glucose, Bld: 204 mg/dL — ABNORMAL HIGH (ref 70–99)
Potassium: 4.4 mEq/L (ref 3.5–5.1)
Sodium: 139 mEq/L (ref 135–145)
Total Bilirubin: 0.5 mg/dL (ref 0.2–1.2)
Total Protein: 6.6 g/dL (ref 6.0–8.3)

## 2019-01-18 LAB — LIPID PANEL
Cholesterol: 198 mg/dL (ref 0–200)
HDL: 36.6 mg/dL — ABNORMAL LOW (ref >39.00)
LDL Cholesterol: 131 mg/dL — ABNORMAL HIGH (ref 0–99)
NonHDL: 161.54
Total CHOL/HDL Ratio: 5
Triglycerides: 152 mg/dL — ABNORMAL HIGH (ref 0.0–149.0)
VLDL: 30.4 mg/dL (ref 0.0–40.0)

## 2019-01-18 LAB — HEMOGLOBIN A1C: Hgb A1c MFr Bld: 9.2 % — ABNORMAL HIGH (ref 4.6–6.5)

## 2019-01-18 NOTE — Progress Notes (Signed)
Complaint:  Visit Type: Patient returns to my office for continued preventative foot care services. Complaint: Patient states" my nails have grown long and thick and become painful to walk and wear shoes" Patient has been diagnosed with DM with no foot complications. The patient presents for preventative foot care services. No changes to ROS  Podiatric Exam: Vascular: dorsalis pedis and posterior tibial pulses are palpable bilateral. Capillary return is immediate. Temperature gradient is WNL. Skin turgor WNL  Sensorium: Normal Semmes Weinstein monofilament test. Normal tactile sensation bilaterally. Nail Exam: Pt has thick disfigured discolored nails with subungual debris noted bilateral entire nail hallux through fifth toenails.  Absence of right hallux toenail noted. Ulcer Exam: There is no evidence of ulcer or pre-ulcerative changes or infection. Orthopedic Exam: Muscle tone and strength are WNL. No limitations in general ROM. No crepitus or effusions noted. Hammer toe second right foot. Bony prominences are unremarkable. Skin: No Porokeratosis. No infection or ulcers  Diagnosis:  Onychomycosis, , Pain in right toe, pain in left toes  Treatment & Plan Procedures and Treatment: Consent by patient was obtained for treatment procedures.   Debridement of mycotic and hypertrophic toenails, 1 through 5 bilateral and clearing of subungual debris. No ulceration, no infection noted.  Return Visit-Office Procedure: Patient instructed to return to the office for a follow up visit 10 weeks  for continued evaluation and treatment.    Jaiveon Suppes DPM 

## 2019-01-21 ENCOUNTER — Ambulatory Visit
Admission: RE | Admit: 2019-01-21 | Discharge: 2019-01-21 | Disposition: A | Discharge status: Home / Self Care | Payer: Medicare Other | Source: Ambulatory Visit | Attending: Family Medicine | Admitting: Family Medicine

## 2019-01-21 DIAGNOSIS — Z780 Asymptomatic menopausal state: Secondary | ICD-10-CM | POA: Diagnosis not present

## 2019-01-21 DIAGNOSIS — M810 Age-related osteoporosis without current pathological fracture: Secondary | ICD-10-CM | POA: Insufficient documentation

## 2019-01-28 ENCOUNTER — Telehealth: Payer: Self-pay | Admitting: Family Medicine

## 2019-01-28 NOTE — Telephone Encounter (Signed)
Copied from [place] [id number]. Topic: Quick Communication - See Telephone Encounter >> Jan 28, 2019  3:19 PM Nils Flack wrote: CRM for notification. See Telephone encounter for: 01/28/19.  Pt is interested in taking Osembic instead of metformin.  She has taken metformin in the past and it made her sick.  [phone]

## 2019-01-29 NOTE — Telephone Encounter (Signed)
Noted.  Please find out if patient has a personal or family history of thyroid cancer, parathyroid cancer, or adrenal gland cancer.  If she does not we can use the Ozempic.

## 2019-01-29 NOTE — Telephone Encounter (Signed)
Pt is interested in taking Osembic instead of metformin.  She has taken metformin in the past and it made her sick

## 2019-02-08 ENCOUNTER — Telehealth: Payer: Self-pay | Admitting: *Deleted

## 2019-02-08 NOTE — Telephone Encounter (Signed)
Copied from [place] [id number]. Topic: General - Other >> Feb 08, 2019 10:53 AM Yvette Rack wrote: Reason for CRM: Pt stated she just spoke with Dr. Ellen Henri nurse and she has some more questions that she would like to discuss. Pt requests call back

## 2019-02-08 NOTE — Telephone Encounter (Signed)
I called and spoke with the patient and she stated her insurance called her and stated Metformin was taken off the market and she is not to take it anymore.  Patient wants to think about the medication ozempic and get back to [place] about that, she prefers a pill, she does not want to inject medication, she also states she has a new meter and she is going to ask the pharmacist to show her how to use it and send in her BS logs because in the morning her sugar drops really low after she eats.  I informed her to bring the logs here and she needs to log for about a weeks worth.  Nina,cma

## 2019-02-08 NOTE — Telephone Encounter (Signed)
Noted. We will await her glucose logs.

## 2019-02-09 ENCOUNTER — Other Ambulatory Visit: Payer: Self-pay

## 2019-02-09 ENCOUNTER — Encounter: Payer: Self-pay | Admitting: Family Medicine

## 2019-02-09 ENCOUNTER — Ambulatory Visit (INDEPENDENT_AMBULATORY_CARE_PROVIDER_SITE_OTHER): Payer: Medicare Other | Admitting: Family Medicine

## 2019-02-09 VITALS — BP 150/60 | HR 85 | Temp 98.7°F | Ht 65.0 in | Wt 163.0 lb

## 2019-02-09 DIAGNOSIS — E559 Vitamin D deficiency, unspecified: Secondary | ICD-10-CM

## 2019-02-09 DIAGNOSIS — J309 Allergic rhinitis, unspecified: Secondary | ICD-10-CM | POA: Diagnosis not present

## 2019-02-09 DIAGNOSIS — E119 Type 2 diabetes mellitus without complications: Secondary | ICD-10-CM | POA: Diagnosis not present

## 2019-02-09 DIAGNOSIS — M810 Age-related osteoporosis without current pathological fracture: Secondary | ICD-10-CM

## 2019-02-09 NOTE — Progress Notes (Signed)
  Tommi Rumps, MD Phone: [phone]  Derhonda Eastlick Stage is a 85 y.o. female who presents today for follow-up.  Allergic rhinitis: Patient notes chronic issues for years with some postnasal drip and a sensation of swelling in the glands in her neck underneath her jaw.  She will occasionally have some tenderness with those.  No sneezing.  Occasional rhinorrhea with wearing a mask though nothing significant otherwise.  No congestion.  No cough, shortness of breath, or fever.  She started back on Allegra which has been beneficial.  Osteoporosis: She does report a history of fracture of her right ankle.  She is willing to try Prolia.  She needs a vitamin D check.  Diabetes: She is not checking sugars.  No polyuria or polydipsia.  She has been doing boost glucose control which does help prevent her sugar from dropping low.  She has been working on diet and exercise and would prefer to try that prior to starting on diabetic medication.  Social History   Tobacco Use  Smoking Status Never Smoker  Smokeless Tobacco Never Used     ROS see history of present illness  Objective  Physical Exam Vitals:   02/09/19 1640  BP: (!) 150/60  Pulse: 85  Temp: 98.7 F (37.1 C)    BP Readings from Last 3 Encounters:  02/09/19 (!) 150/60  12/15/18 (!) 148/57  07/02/18 (!) 142/72   Wt Readings from Last 3 Encounters:  02/09/19 163 lb (73.9 kg)  12/15/18 159 lb (72.1 kg)  07/02/18 165 lb 1.9 oz (74.9 kg)    Physical Exam Constitutional:      General: She is not in acute distress.    Appearance: She is not diaphoretic.  HENT:     Mouth/Throat:     Mouth: Mucous membranes are moist.     Pharynx: Oropharynx is clear.  [place]att:     Conjunctiva/sclera: Conjunctivae normal.     Pupils: Pupils are equal, round, and reactive to light.  Neck:     Musculoskeletal: Neck supple.  Cardiovascular:     Rate and Rhythm: Normal rate and regular rhythm.     Heart sounds: Normal heart sounds.  Pulmonary:      Effort: Pulmonary effort is normal.     Breath sounds: Normal breath sounds.  Lymphadenopathy:     Cervical: No cervical adenopathy.  Skin:    General: Skin is warm and dry.  Neurological:     Mental Status: She is alert.      Assessment/Plan: Please see individual problem list.  Allergic rhinitis Chronic issue.  Benign exam.  She will start on Flonase and continue Allegra.  I suspect the glandular swelling she has noted intermittently is related to her postnasal drip and allergies.  She will monitor for worsening or persistence.  OP (osteoporosis) She is willing to trial Prolia.  She needs a vitamin D check first.  She will schedule this on her way out.  Diabetes ([place]) She wants to work on diet and exercise.  She will follow-up in 3 months and if A1c is not improving she is willing to start on medication.   Orders Placed This Encounter  Procedures  . Vitamin D (25 hydroxy)    Standing Status:   Future    Standing Expiration Date:   02/09/2020    No orders of the defined types were placed in this encounter.    Tommi Rumps, MD [place]

## 2019-02-09 NOTE — Progress Notes (Signed)
Patient called and agreed to try prolia, but she doeas not want it right away, she is scheduled today @ 4:30 pm for lump in her throat.  Nina,cma

## 2019-02-09 NOTE — Assessment & Plan Note (Signed)
She wants to work on diet and exercise.  She will follow-up in 3 months and if A1c is not improving she is willing to start on medication.

## 2019-02-09 NOTE — Patient Instructions (Signed)
Nice to see you. Please continue to work on diet exercise. Please start on Flonase and continue Allegra. Please call to schedule a vitamin D check sometime in the next several weeks.

## 2019-02-09 NOTE — Assessment & Plan Note (Addendum)
Chronic issue.  Benign exam.  She will start on Flonase and continue Allegra.  I suspect the glandular swelling she has noted intermittently is related to her postnasal drip and allergies.  She will monitor for worsening or persistence.

## 2019-02-09 NOTE — Assessment & Plan Note (Signed)
She is willing to trial Prolia.  She needs a vitamin D check first.  She will schedule this on her way out.

## 2019-02-12 NOTE — Telephone Encounter (Signed)
err

## 2019-02-16 ENCOUNTER — Telehealth: Payer: Self-pay

## 2019-02-16 NOTE — Telephone Encounter (Signed)
Copied from [place] [id number]. Topic: General - Other >> Feb 16, 2019  9:20 AM Rainey Pines A wrote: Patient would like a callback from nurse in regards to a change in the medication she was prescribed. >> Feb 16, 2019  9:24 AM Rainey Pines A wrote: Patient also feels that her hand shakes too bad to check her blood sugar. She feels she is messing up 4-5 test strips each time

## 2019-02-17 NOTE — Telephone Encounter (Signed)
Pt called to return call. Pt state she will be leaving the house this morning, but she should be back around 11. Pt states she would like to start back on the jardiance, and to find out what to differently to test her blood sugar levels.

## 2019-02-17 NOTE — Telephone Encounter (Signed)
Patients states she knows she was taken off the Jardiance because she got sick but she thinks it was because the Metformin was still in her system, patient wants to go back on the [place] and she is having a difficult time checking her BS, she shakes so bad she messes up 7-8 strips at a time trying to check her sugar and wants to know if she could get the one to check the sugar on her arm like a patch that was advertised on TV, she stated the TV ad said Medicare will pay for it.  Please advise.  Caitlyne Ingham,cma

## 2019-02-21 NOTE — Telephone Encounter (Signed)
Given the side effects she had with the jardiance I would not restart that medication. I would suggest a medication called tradjenta if she has not tried that previously. It is generally well tolerated and is an oral medication. If she is ok trying that I can send it to her pharmacy. The issue with the continuous glucose monitor is that insurance requires that the patient be checking the glucose 4-x/day and taking 3+ insulin shots daily to qualify. She would not qualify for this as I understand the requirements to be, though I will forward to [name] to make sure.

## 2019-02-22 NOTE — Telephone Encounter (Signed)
The clinical criteria as you listed are correct. However, I went to the [employer] website just to make sure, and it was noted that due to the pandemic, CMS has waived the "clinical criteria for CGM coverage". One spot on the website lists that the "checking 4x/day" criteria is waived, but does not mention that the insulin requirement is waived or lessened.   I'm going to do some research, however, for now, I do not believe Medicare routinely covers the meter for patients not on insulin therapy.   What is her "shaking" that is preventing her from checking effectively?

## 2019-02-23 NOTE — Telephone Encounter (Signed)
Please let the patient know the clinical pharmacist is going to look in to the requirements for the continuous glucose monitor though we do not think she will qualify for this. Please clarify what she means by her shaking. Please also see if she would be willing to try the tradjenta for her diabetes if she has not already tried this. I would not recommend trying the jardiance given the side effects she had previously.

## 2019-02-23 NOTE — Telephone Encounter (Signed)
Patient stated she is not concerned at all about the shaking because its mostly nerves, she states when she goes to check her sugar with the strips she has sweaty hands, it rejects the strip or her hands shakes from her getting nervous and she tends to get frustrated and she may go through 3-5 strips before she can get a good drop on the strips. She states there is nothing wrong to cause the shaking, she just gets annoyed and frustrated trying to get the blood on the strips.  She will research Tradjenta because she has not used it before or ever heard of it and she will let me know if she wants to try it tomorrow. I will call her tomorrow.   Nina,cma

## 2019-02-24 NOTE — Telephone Encounter (Signed)
-----   Message from Gordy Councilman, [place] sent at 02/23/2019  3:59 PM EDT ----- I have to call the patient tomorrow 02/24/2019 to see if she is willing to try the tradjenta and let the provider know.  Ryllie Nieland,cma

## 2019-02-25 ENCOUNTER — Other Ambulatory Visit (INDEPENDENT_AMBULATORY_CARE_PROVIDER_SITE_OTHER): Payer: Medicare Other

## 2019-02-25 ENCOUNTER — Other Ambulatory Visit: Payer: Self-pay

## 2019-02-25 DIAGNOSIS — E559 Vitamin D deficiency, unspecified: Secondary | ICD-10-CM

## 2019-02-25 LAB — VITAMIN D 25 HYDROXY (VIT D DEFICIENCY, FRACTURES): VITD: 50.53 ng/mL (ref 30.00–100.00)

## 2019-02-25 NOTE — Telephone Encounter (Signed)
Tradjenta only has one dose and it is 5 mg. I can send it in if this is acceptable to her.

## 2019-02-26 ENCOUNTER — Telehealth: Payer: Self-pay

## 2019-02-26 NOTE — Telephone Encounter (Signed)
Left message for patient to return call to office, PEC nurse may advise . 

## 2019-02-26 NOTE — Telephone Encounter (Signed)
Copied from [place] [id number]. Topic: General - Inquiry >> Feb 26, 2019  4:27 PM Mathis Bud wrote: Reason for CRM: Patient is calling back PCP nurse. Call back [phone]

## 2019-03-03 NOTE — Telephone Encounter (Signed)
Will discuss once she is called with remainder of her lab results today.

## 2019-03-10 NOTE — Telephone Encounter (Signed)
I do not believe we would be able to have CGM covered by her insurance, based on her medication regimen (and projected medication regimen).   Recommend patient check a few times a week. Agree with consideration of Tradjenta.   If patient unwilling to do injectable GLP1, could try for Rybelsus (oral GLP1). Unsure if would be covered by insurance, but would likely be more effective than Tradjenta.

## 2019-03-11 NOTE — Telephone Encounter (Signed)
Please let the patient know that the pharmacist reviewed her chart and noted that this would not be able to get the continuous glucose monitor covered by her insurance.  She recommended that the patient check her glucose a few times a week.  Please see if the patient is willing to trial Tradjenta for her diabetes.  This was previously discussed with her though it appears she may have decided during the prior office visit that she wanted to work on diet and exercise.

## 2019-03-11 NOTE — Telephone Encounter (Signed)
I called and spoke with the patient and she stated that no she did not want to try the tradjenta at this time, she would work on diet and exercise and she understood about the glucose monitor.  Nina,cma

## 2019-03-29 ENCOUNTER — Other Ambulatory Visit: Payer: Self-pay

## 2019-03-29 ENCOUNTER — Ambulatory Visit: Payer: Medicare Other | Admitting: Podiatry

## 2019-03-29 ENCOUNTER — Encounter: Payer: Self-pay | Admitting: Podiatry

## 2019-03-29 DIAGNOSIS — M79676 Pain in unspecified toe(s): Secondary | ICD-10-CM

## 2019-03-29 DIAGNOSIS — B351 Tinea unguium: Secondary | ICD-10-CM | POA: Diagnosis not present

## 2019-03-29 DIAGNOSIS — E119 Type 2 diabetes mellitus without complications: Secondary | ICD-10-CM

## 2019-03-29 NOTE — Progress Notes (Signed)
Complaint:  Visit Type: Patient returns to my office for continued preventative foot care services. Complaint: Patient states" my nails have grown long and thick and become painful to walk and wear shoes" Patient has been diagnosed with DM with no foot complications. The patient presents for preventative foot care services. No changes to ROS  Podiatric Exam: Vascular: dorsalis pedis and posterior tibial pulses are palpable bilateral. Capillary return is immediate. Temperature gradient is WNL. Skin turgor WNL  Sensorium: Normal Semmes Weinstein monofilament test. Normal tactile sensation bilaterally. Nail Exam: Pt has thick disfigured discolored nails with subungual debris noted bilateral entire nail hallux through fifth toenails.  Absence of right hallux toenail noted. Ulcer Exam: There is no evidence of ulcer or pre-ulcerative changes or infection. Orthopedic Exam: Muscle tone and strength are WNL. No limitations in general ROM. No crepitus or effusions noted. Hammer toe second right foot. Bony prominences are unremarkable. Skin: No Porokeratosis. No infection or ulcers  Diagnosis:  Onychomycosis, , Pain in right toe, pain in left toes  Treatment & Plan Procedures and Treatment: Consent by patient was obtained for treatment procedures.   Debridement of mycotic and hypertrophic toenails, 1 through 5 bilateral and clearing of subungual debris. No ulceration, no infection noted.  Return Visit-Office Procedure: Patient instructed to return to the office for a follow up visit 10 weeks  for continued evaluation and treatment.    Solan Vosler DPM 

## 2019-05-12 ENCOUNTER — Ambulatory Visit: Payer: Medicare Other | Admitting: Family Medicine

## 2019-06-07 ENCOUNTER — Other Ambulatory Visit: Payer: Self-pay

## 2019-06-07 ENCOUNTER — Ambulatory Visit: Payer: Medicare Other | Admitting: Podiatry

## 2019-06-07 ENCOUNTER — Encounter: Payer: Self-pay | Admitting: Podiatry

## 2019-06-07 DIAGNOSIS — E119 Type 2 diabetes mellitus without complications: Secondary | ICD-10-CM | POA: Diagnosis not present

## 2019-06-07 DIAGNOSIS — B351 Tinea unguium: Secondary | ICD-10-CM

## 2019-06-07 DIAGNOSIS — M79676 Pain in unspecified toe(s): Secondary | ICD-10-CM | POA: Diagnosis not present

## 2019-06-07 NOTE — Progress Notes (Signed)
Complaint:  Visit Type: Patient returns to my office for continued preventative foot care services. Complaint: Patient states" my nails have grown long and thick and become painful to walk and wear shoes" Patient has been diagnosed with DM with no foot complications. The patient presents for preventative foot care services. No changes to ROS  Podiatric Exam: Vascular: dorsalis pedis and posterior tibial pulses are palpable bilateral. Capillary return is immediate. Temperature gradient is WNL. Skin turgor WNL  Sensorium: Normal Semmes Weinstein monofilament test. Normal tactile sensation bilaterally. Nail Exam: Pt has thick disfigured discolored nails with subungual debris noted bilateral entire nail hallux through fifth toenails.  Absence of right hallux toenail noted. Ulcer Exam: There is no evidence of ulcer or pre-ulcerative changes or infection. Orthopedic Exam: Muscle tone and strength are WNL. No limitations in general ROM. No crepitus or effusions noted. Hammer toe second right foot. Bony prominences are unremarkable. Skin: No Porokeratosis. No infection or ulcers  Diagnosis:  Onychomycosis, , Pain in right toe, pain in left toes  Treatment & Plan Procedures and Treatment: Consent by patient was obtained for treatment procedures.   Debridement of mycotic and hypertrophic toenails, 1 through 5 bilateral and clearing of subungual debris. No ulceration, no infection noted.  Return Visit-Office Procedure: Patient instructed to return to the office for a follow up visit 10 weeks  for continued evaluation and treatment.    Layni Kreamer DPM 

## 2019-06-10 ENCOUNTER — Telehealth: Payer: Self-pay | Admitting: *Deleted

## 2019-06-10 NOTE — Telephone Encounter (Signed)
-----   Message from Leone Haven, MD sent at 04/23/2019  5:40 PM EDT ----- It is ok to order prolia.

## 2019-06-10 NOTE — Telephone Encounter (Signed)
Patient prolia available she chooses to wait until appointment for first injection to discuss with PCP.

## 2019-06-11 ENCOUNTER — Other Ambulatory Visit: Payer: Self-pay

## 2019-06-21 ENCOUNTER — Ambulatory Visit (INDEPENDENT_AMBULATORY_CARE_PROVIDER_SITE_OTHER): Payer: Medicare Other | Admitting: Family Medicine

## 2019-06-21 ENCOUNTER — Encounter: Payer: Self-pay | Admitting: Family Medicine

## 2019-06-21 ENCOUNTER — Other Ambulatory Visit: Payer: Self-pay

## 2019-06-21 VITALS — BP 130/56 | Ht 65.0 in | Wt 156.0 lb

## 2019-06-21 DIAGNOSIS — I10 Essential (primary) hypertension: Secondary | ICD-10-CM

## 2019-06-21 DIAGNOSIS — E119 Type 2 diabetes mellitus without complications: Secondary | ICD-10-CM

## 2019-06-21 DIAGNOSIS — M810 Age-related osteoporosis without current pathological fracture: Secondary | ICD-10-CM

## 2019-06-21 DIAGNOSIS — H9201 Otalgia, right ear: Secondary | ICD-10-CM

## 2019-06-21 MED ORDER — CEFDINIR 300 MG PO CAPS: 300.0000 mg | ORAL_CAPSULE | Freq: Two times a day (BID) | ORAL | 0 refills | Status: DC

## 2019-06-21 NOTE — Assessment & Plan Note (Signed)
Continues to be an issue.  Did improve with antibiotics that were taken for her teeth.  We will trial cefdinir to see if that will resolve her symptoms.  If not improving she will call us.

## 2019-06-21 NOTE — Assessment & Plan Note (Signed)
Adequately controlled.  She will continue lisinopril.  She will come in for lab work in the next several weeks.

## 2019-06-21 NOTE — Assessment & Plan Note (Signed)
We will get her set up for a Prolia injection.  She will continue vitamin D.

## 2019-06-21 NOTE — Assessment & Plan Note (Signed)
Poorly controlled previously.  She reports feeling as though her sugar has dropped recently.  I have asked that she actually check her sugar when that occurs to see if this is true hypoglycemia or subjective hypoglycemia.  She will have an A1c.  Continue diet and exercise.

## 2019-06-21 NOTE — Progress Notes (Signed)
Virtual Visit via telephone Note  This visit type was conducted due to national recommendations for restrictions regarding the COVID-19 pandemic (e.g. social distancing).  This format is felt to be most appropriate for this patient at this time.  All issues noted in this document were discussed and addressed.  No physical exam was performed (except for noted visual exam findings with Video Visits).   I connected with Laurie Patel today at  9:00 AM EST by telephone and verified that I am speaking with the correct person using two identifiers. Location patient: home Location provider: work Persons participating in the virtual visit: patient, provider, Aleysha Meckler (daughter)  I discussed the limitations, risks, security and privacy concerns of performing an evaluation and management service by telephone and the availability of in person appointments. I also discussed with the patient that there may be a patient responsible charge related to this service. The patient expressed understanding and agreed to proceed.  Interactive audio and video telecommunications were attempted between this provider and patient, however failed, due to patient having technical difficulties OR patient did not have access to video capability.  We continued and completed visit with audio only.   Reason for visit: follow-up  HPI: DIABETES Disease Monitoring: Blood Sugar ranges-not checking Polyuria/phagia/dipsia- no      Optho- UTD Medications: Compliance- no medications Hypoglycemic symptoms- occasionally does not feel good and will eat something She is eating healthy with lean meats and vegetables.  She is been walking more for exercise.  She notes a little bit of weight loss that was purposeful.  HYPERTENSION  Disease Monitoring  Home BP Monitoring 130/56 Chest pain- no    Dyspnea- no Medications  Compliance-  Taking lisinopril  Osteoporosis: ready to start prolia. Taking vitamin D 1000 IU.   Ear drainage:  Patient notes this has been an ongoing issue.  Possibly worse recently.  She does occasionally have pain in her right ear.  She has hearing loss in the right ear.  It drains gooey stuff.  No fever.  On 2 occasions she has had TMJ type pain though it resolves on its own.  She takes Allegra off and on though it is not terribly beneficial.  She was on penicillin for a tooth infection that did help her ear symptoms significantly.     ROS: See pertinent positives and negatives per HPI.  Past Medical History:  Diagnosis Date  . Ankle fracture 10/14/2017  . Heart murmur   . PNA (pneumonia) 02/16/2018    Past Surgical History:  Procedure Laterality Date  . APPENDECTOMY  1986  . EYE SURGERY Bilateral 1995 and 1999   Lens implants  . WRIST SURGERY Left 1986   ORIF     Family History  Problem Relation Age of Onset  . Cancer Mother 54       Breast Cancer  . Heart disease Father 82       Congestive Heart Failure  . COPD Father 94       Emphysema  . Heart disease Brother 49       massive heart attack  . Heart disease Maternal Grandmother   . Heart disease Maternal Grandfather   . Early death Paternal Grandmother        Early death unsure age  . Heart disease Paternal Grandfather   . Obesity Paternal Grandfather     SOCIAL HX: Non-smoker   Current Outpatient Medications:  .  aspirin 81 MG EC tablet, Take 81 mg by mouth daily.  Swallow whole., Disp: , Rfl:  .  blood glucose meter kit and supplies KIT, Dispense based on patient and insurance preference. Use once daily. E11.9., Disp: 1 each, Rfl: 0 .  Lancets (ONETOUCH DELICA PLUS [id number]) MISC, USE ONE DAILY, Disp: 100 each, Rfl: 0 .  lisinopril (ZESTRIL) 5 MG tablet, TAKE ONE TABLET BY MOUTH DAILY *MUST KEEP APPOINTMENT FOR FURTHER REFILLS*, Disp: 90 tablet, Rfl: 1 .  Multiple Vitamins-Calcium (ONE-A-DAY WOMENS PO), Take 1 tablet by mouth daily., Disp: , Rfl:  .  ONETOUCH VERIO test strip, , Disp: , Rfl:  .  cefdinir (OMNICEF) 300  MG capsule, Take 1 capsule (300 mg total) by mouth 2 (two) times daily., Disp: 14 capsule, Rfl: 0  EXAM: This is a telehealth telephone visit thus no physical exam was completed.  ASSESSMENT AND PLAN:  Discussed the following assessment and plan:  HTN (hypertension) Adequately controlled.  She will continue lisinopril.  She will come in for lab work in the next several weeks.  Diabetes ([place]) Poorly controlled previously.  She reports feeling as though her sugar has dropped recently.  I have asked that she actually check her sugar when that occurs to see if this is true hypoglycemia or subjective hypoglycemia.  She will have an A1c.  Continue diet and exercise.  Ear discomfort, right Continues to be an issue.  Did improve with antibiotics that were taken for her teeth.  We will trial cefdinir to see if that will resolve her symptoms.  If not improving she will call us.  OP (osteoporosis) We will get her set up for a Prolia injection.  She will continue vitamin D.    I discussed the assessment and treatment plan with the patient. The patient was provided an opportunity to ask questions and all were answered. The patient agreed with the plan and demonstrated an understanding of the instructions.   The patient was advised to call back or seek an in-person evaluation if the symptoms worsen or if the condition fails to improve as anticipated.  I provided 24 minutes of non-face-to-face time during this encounter.   Tommi Rumps, MD

## 2019-06-21 NOTE — Progress Notes (Signed)
Pre visit review using our clinic review tool, if applicable. No additional management support is needed unless otherwise documented below in the visit note. 

## 2019-06-22 ENCOUNTER — Telehealth: Payer: Self-pay | Admitting: Family Medicine

## 2019-06-22 NOTE — Telephone Encounter (Signed)
Pt will cb to schedule Return in about 1 week (around 06/28/2019) for next few weeks for prolia injection and labs.

## 2019-07-05 ENCOUNTER — Ambulatory Visit: Payer: Medicare Other | Admitting: Family Medicine

## 2019-07-05 ENCOUNTER — Ambulatory Visit: Payer: Medicare Other

## 2019-07-08 ENCOUNTER — Ambulatory Visit (INDEPENDENT_AMBULATORY_CARE_PROVIDER_SITE_OTHER): Payer: Medicare Other

## 2019-07-08 ENCOUNTER — Other Ambulatory Visit: Payer: Self-pay

## 2019-07-08 VITALS — Ht 65.0 in | Wt 156.0 lb

## 2019-07-08 DIAGNOSIS — Z0000 Encounter for general adult medical examination without abnormal findings: Secondary | ICD-10-CM | POA: Diagnosis not present

## 2019-07-08 NOTE — Patient Instructions (Addendum)
  Ms. Mihalic , Thank you for taking time to come for your Medicare Wellness Visit. I appreciate your ongoing commitment to your health goals. Please review the following plan we discussed and let me know if I can assist you in the future.   These are the goals we discussed: Goals    . Follow up with Primary Care Provider     As needed       This is a list of the screening recommended for you and due dates:  Health Maintenance  Topic Date Due  . Hemoglobin A1C  07/21/2019  . Complete foot exam   10/19/2019  . Eye exam for diabetics  10/27/2019  . Tetanus Vaccine  02/09/2020  . Flu Shot  Completed  . DEXA scan (bone density measurement)  Completed  . Pneumonia vaccines  Completed

## 2019-07-08 NOTE — Progress Notes (Signed)
Subjective:   Laurie Patel is a 86 y.o. female who presents for Medicare Annual (Subsequent) preventive examination.  Review of Systems:  No ROS.  Medicare Wellness Virtual Visit.  Visual/audio telehealth visit, UTA vital signs.   Ht/Wt provided.  See social history for additional risk factors.   Cardiac Risk Factors include: advanced age (>33mn, >>14women);hypertension;diabetes mellitus     Objective:     Vitals: Ht 5' 5"  (1.651 m)   Wt 156 lb (70.8 kg)   BMI 25.96 kg/m   Body mass index is 25.96 kg/m.  Advanced Directives 07/08/2019 07/02/2018 06/26/2017 03/05/2017  Does Patient Have a Medical Advance Directive? Yes No No No  Type of AParamedicof ACardwellLiving will - - -  Does patient want to make changes to medical advance directive? - No - Patient declined - -  Copy of [place] Chart? No - copy requested - - -  Would patient like information on creating a medical advance directive? - - No - Patient declined -    Tobacco Social History   Tobacco Use  Smoking Status Never Smoker  Smokeless Tobacco Never Used     Counseling given: Not Answered   Clinical Intake:  Pre-visit preparation completed: Yes        Diabetes: Yes(Followed by pcp)  How often do you need to have someone help you when you read instructions, pamphlets, or other written materials from your doctor or pharmacy?: 1 - Never  Interpreter Needed?: No     Past Medical History:  Diagnosis Date  . Ankle fracture 10/14/2017  . Heart murmur   . PNA (pneumonia) 02/16/2018   Past Surgical History:  Procedure Laterality Date  . APPENDECTOMY  1986  . EYE SURGERY Bilateral 1995 and 1999   Lens implants  . WRIST SURGERY Left 1986   ORIF    Family History  Problem Relation Age of Onset  . Cancer Mother 768      Breast Cancer  . Heart disease Father 821      Congestive Heart Failure  . COPD Father 811      Emphysema  . Heart disease Brother  740      massive heart attack  . Heart disease Maternal Grandmother   . Heart disease Maternal Grandfather   . Early death Paternal Grandmother        Early death unsure age  . Heart disease Paternal Grandfather   . Obesity Paternal Grandfather    Social History   Socioeconomic History  . Marital status: Widowed    Spouse name: Not on file  . Number of children: Not on file  . Years of education: Not on file  . Highest education level: Not on file  Occupational History  . Not on file  Tobacco Use  . Smoking status: Never Smoker  . Smokeless tobacco: Never Used  Substance and Sexual Activity  . Alcohol use: No    Alcohol/week: 0.0 standard drinks  . Drug use: No  . Sexual activity: Never  Other Topics Concern  . Not on file  Social History Narrative   [place] in [place]   Owns a [employer]    1 dog   Social Determinants of Health   Financial Resource Strain: Low Risk   . Difficulty of Paying Living Expenses: Not hard at all  Food Insecurity: No Food Insecurity  . Worried About RCharity fundraiserin the Last  Year: Never true  . Ran Out of Food in the Last Year: Never true  Transportation Needs: No Transportation Needs  . Lack of Transportation (Medical): No  . Lack of Transportation (Non-Medical): No  Physical Activity:   . Days of Exercise per Week: Not on file  . Minutes of Exercise per Session: Not on file  Stress: No Stress Concern Present  . Feeling of Stress : Not at all  Social Connections: Unknown  . Frequency of Communication with Friends and Family: More than three times a week  . Frequency of Social Gatherings with Friends and Family: More than three times a week  . Attends Religious Services: Not on file  . Active Member of Clubs or Organizations: Not on file  . Attends Archivist Meetings: Not on file  . Marital Status: Widowed    Outpatient Encounter Medications as of 07/08/2019  Medication Sig  . aspirin 81 MG EC tablet Take 81  mg by mouth daily. Swallow whole.  . blood glucose meter kit and supplies KIT Dispense based on patient and insurance preference. Use once daily. E11.9.  Marland Kitchen cefdinir (OMNICEF) 300 MG capsule Take 1 capsule (300 mg total) by mouth 2 (two) times daily.  . Lancets (ONETOUCH DELICA PLUS [id number]) MISC USE ONE DAILY  . lisinopril (ZESTRIL) 5 MG tablet TAKE ONE TABLET BY MOUTH DAILY *MUST KEEP APPOINTMENT FOR FURTHER REFILLS*  . Multiple Vitamins-Calcium (ONE-A-DAY WOMENS PO) Take 1 tablet by mouth daily.  Glory Rosebush VERIO test strip    No facility-administered encounter medications on file as of 07/08/2019.    Activities of Daily Living In your present state of health, do you have any difficulty performing the following activities: 07/08/2019  Hearing? Y  Comment Some difficuty hearing R ear. No hearing aid.  Vision? N  Difficulty concentrating or making decisions? N  Walking or climbing stairs? Y  Comment Unsteady gait  Dressing or bathing? N  Doing errands, shopping? N  Preparing Food and eating ? N  Using the Toilet? N  In the past six months, have you accidently leaked urine? N  Do you have problems with loss of bowel control? N  Managing your Medications? N  Managing your Finances? N  Housekeeping or managing your Housekeeping? N  Some recent data might be hidden    Patient Care Team: Leone Haven, MD as PCP - General (Family Medicine)    Assessment:   This is a routine wellness examination for Laurie Patel.  Nurse connected with patient 07/08/19 at  9:30 AM EST by a telephone enabled telemedicine application and verified that I am speaking with the correct person using two identifiers. Patient stated full name and DOB. Patient gave permission to continue with virtual visit. Patient's location was at home and Nurse's location was at [place] office.   Patient is alert and oriented x3. Patient denies difficulty focusing or concentrating. Patient likes to read, cross stitch and  complete jigsaw puzzles for brain stimulation.   Health Maintenance Due: See completed HM at the end of note.   Eye: Visual acuity not assessed. Virtual visit. Followed by their ophthalmologist.  Retinopathy- none reported.  Dental: UTD  Hearing: Demonstrates normal hearing during visit. Hearing aids- none  Safety:  Patient feels safe at home- yes Patient does have smoke detectors at home- yes Patient does wear sunscreen or protective clothing when in direct sunlight - yes Patient does wear seat belt when in a moving vehicle - yes Patient drives- not currently Adequate  lighting in walkways free from debris- yes Grab bars and handrails used as appropriate- yes Ambulates with an assistive device- yes; cane/rollator as needed Cell phone on person when ambulating outside of the home- yes  Social: Alcohol intake - no      Smoking history- never   Smokers in home? none Illicit drug use? none  Medication: Taking as directed and without issues.  Self managed - yes   Covid-19: Precautions and sickness symptoms discussed. Wears mask, social distancing, hand hygiene as appropriate.   Activities of Daily Living Patient denies needing assistance with: household chores, feeding themselves, getting from bed to chair, getting to the toilet, bathing/showering, dressing, managing money, or preparing meals.   Discussed the importance of a healthy diet, water intake and the benefits of aerobic exercise.   Physical activity- walking indoors  Diet:  Low carb  Water: good intake Caffeine: 1 cup of tea  Other Providers Patient Care Team: Leone Haven, MD as PCP - General (Family Medicine)  Exercise Activities and Dietary recommendations Current Exercise Habits: Home exercise routine, Type of exercise: walking, Intensity: Mild  Goals    . Follow up with Primary Care Provider     As needed       Fall Risk Fall Risk  07/08/2019 06/21/2019 07/02/2018 06/26/2017 09/19/2016  Falls  in the past year? 0 0 0 Yes No  Number falls in past yr: - - - 1 -  Injury with Fall? - - - Yes -  Comment - - - Followed by PCP -  Follow up Falls evaluation completed - - - -   Timed Get Up and Go performed: no, virtual visit  Depression Screen PHQ 2/9 Scores 07/08/2019 06/21/2019 07/02/2018 06/26/2017  PHQ - 2 Score 0 0 0 0  PHQ- 9 Score - - - 0     Cognitive Function MMSE - Mini Mental State Exam 06/26/2017  Orientation to time 5  Orientation to Place 5  Registration 3  Attention/ Calculation 5  Recall 3  Language- name 2 objects 2  Language- repeat 1  Language- follow 3 step command 3  Language- read & follow direction 1  Write a sentence 1  Copy design 1  Total score 30     6CIT Screen 07/08/2019 07/02/2018  What Year? 0 points 0 points  What month? 0 points 0 points  What time? 0 points 0 points  Count back from 20 0 points 0 points  Months in reverse 0 points 0 points  Repeat phrase 0 points 0 points  Total Score 0 0    Immunization History  Administered Date(s) Administered  . Influenza Split 02/24/2013, 03/24/2014, 02/29/2016  . Influenza, High Dose Seasonal PF 04/10/2017, 02/14/2018, 02/10/2019  . Influenza-Unspecified 04/07/2017, 02/10/2019  . Pneumococcal Conjugate-13 02/10/2019  . Pneumococcal Polysaccharide-23 02/10/2019  . Pneumococcal-Unspecified 03/24/2014  . Td 02/08/2010  . Tdap 06/24/2008   Screening Tests Health Maintenance  Topic Date Due  . HEMOGLOBIN A1C  07/21/2019  . FOOT EXAM  10/19/2019  . OPHTHALMOLOGY EXAM  10/27/2019  . TETANUS/TDAP  02/09/2020  . INFLUENZA VACCINE  Completed  . DEXA SCAN  Completed  . PNA vac Low Risk Adult  Completed      Plan:   Keep all routine maintenance appointments.   Nurse visit 07/13/19 @ 9:30 -prolia  Next scheduled lab 07/13/19 @ 10:00  Follow up 10/25/19 @ 9:00  Medicare Attestation I have personally reviewed: The patient's medical and social history Their use of alcohol, tobacco  or illicit  drugs Their current medications and supplements The patient's functional ability including ADLs,fall risks, home safety risks, cognitive, and hearing and visual impairment Diet and physical activities Evidence for depression   I have reviewed and discussed with patient certain preventive protocols, quality metrics, and best practice recommendations.     Varney Biles, LPN  [id number]

## 2019-07-13 ENCOUNTER — Other Ambulatory Visit (INDEPENDENT_AMBULATORY_CARE_PROVIDER_SITE_OTHER): Payer: Medicare Other

## 2019-07-13 ENCOUNTER — Other Ambulatory Visit: Payer: Self-pay

## 2019-07-13 ENCOUNTER — Ambulatory Visit (INDEPENDENT_AMBULATORY_CARE_PROVIDER_SITE_OTHER): Payer: Medicare Other

## 2019-07-13 VITALS — Temp 96.5°F

## 2019-07-13 DIAGNOSIS — M810 Age-related osteoporosis without current pathological fracture: Secondary | ICD-10-CM | POA: Diagnosis not present

## 2019-07-13 DIAGNOSIS — E119 Type 2 diabetes mellitus without complications: Secondary | ICD-10-CM | POA: Diagnosis not present

## 2019-07-13 DIAGNOSIS — I10 Essential (primary) hypertension: Secondary | ICD-10-CM

## 2019-07-13 LAB — COMPREHENSIVE METABOLIC PANEL
ALT: 18 U/L (ref 0–35)
AST: 17 U/L (ref 0–37)
Albumin: 4.5 g/dL (ref 3.5–5.2)
Alkaline Phosphatase: 92 U/L (ref 39–117)
BUN: 10 mg/dL (ref 6–23)
CO2: 30 mEq/L (ref 19–32)
Calcium: 9.7 mg/dL (ref 8.4–10.5)
Chloride: 97 mEq/L (ref 96–112)
Creatinine, Ser: 0.73 mg/dL (ref 0.40–1.20)
GFR: 74.92 mL/min (ref >60.00)
Glucose, Bld: 323 mg/dL — ABNORMAL HIGH (ref 70–99)
Potassium: 4.1 mEq/L (ref 3.5–5.1)
Sodium: 135 mEq/L (ref 135–145)
Total Bilirubin: 0.6 mg/dL (ref 0.2–1.2)
Total Protein: 7 g/dL (ref 6.0–8.3)

## 2019-07-13 LAB — HEMOGLOBIN A1C: Hgb A1c MFr Bld: 9.4 % — ABNORMAL HIGH (ref 4.6–6.5)

## 2019-07-13 MED ORDER — DENOSUMAB 60 MG/ML ~~LOC~~ SOSY
60.0000 mg | PREFILLED_SYRINGE | Freq: Once | SUBCUTANEOUS | Status: AC
  Administered 2019-07-13: 60 mg via SUBCUTANEOUS

## 2019-07-13 NOTE — Progress Notes (Signed)
Patient came in today for prolia injection in right arm, IM. Patient tolerated well.

## 2019-07-15 ENCOUNTER — Telehealth: Payer: Self-pay | Admitting: Lab

## 2019-07-15 NOTE — Telephone Encounter (Signed)
Called Pt No answer left VM to call office. 

## 2019-07-19 NOTE — Telephone Encounter (Signed)
Pt called returning your call 

## 2019-07-21 ENCOUNTER — Other Ambulatory Visit: Payer: Self-pay

## 2019-07-21 ENCOUNTER — Telehealth: Payer: Self-pay

## 2019-07-21 MED ORDER — FREESTYLE LIBRE 14 DAY READER DEVI: 1.0000 | Freq: Every day | 0 refills | Status: DC

## 2019-07-21 MED ORDER — FREESTYLE LIBRE 14 DAY SENSOR MISC: 4 refills | Status: DC

## 2019-07-21 NOTE — Telephone Encounter (Signed)
I called and let patient know that I sent in [facility] reader & sensors. I asked that she please call us if she needed a NV for teaching. She said that she thought that the pharmacy would show her how to use. If not she would callback.

## 2019-07-21 NOTE — Telephone Encounter (Signed)
Can you order what is needed for [place]? I started too but I wasn't sure about the supplies needed Please call pt and see if she would like education from nurse visit as to how to use.

## 2019-07-28 ENCOUNTER — Other Ambulatory Visit: Payer: Self-pay | Admitting: Family Medicine

## 2019-07-29 ENCOUNTER — Other Ambulatory Visit: Payer: Self-pay | Admitting: Family Medicine

## 2019-07-29 MED ORDER — EMPAGLIFLOZIN 10 MG PO TABS: 10.0000 mg | ORAL_TABLET | Freq: Every day | ORAL | 0 refills | Status: DC

## 2019-07-30 ENCOUNTER — Encounter: Payer: Self-pay | Admitting: Family Medicine

## 2019-08-02 ENCOUNTER — Telehealth: Payer: Self-pay | Admitting: Family Medicine

## 2019-08-02 NOTE — Telephone Encounter (Signed)
Pt states that the empagliflozin (JARDIANCE) 10 MG TABS tablet is over $200 for a 90 day supply and she can not afford that. Pt would like to be prescribed something else that may be more affordable.

## 2019-08-03 NOTE — Telephone Encounter (Signed)
I called and LV to return a call back.  The provider wants to know if the patient is willing to see the clinical pharmacist regarding her diabetic regimen and to assist in medication cost this would be a referral if she is willing.  Philis Doke,cma

## 2019-08-03 NOTE — Telephone Encounter (Signed)
Please see if she would be willing to speak with the clinical pharmacist regarding her diabetic regimen.  She may be able to assist in getting occasions for cheaper or help [place] figure out what would be the most cost effective regimen for the patient.  Thanks.

## 2019-08-05 ENCOUNTER — Other Ambulatory Visit: Payer: Self-pay | Admitting: Family Medicine

## 2019-08-05 DIAGNOSIS — E119 Type 2 diabetes mellitus without complications: Secondary | ICD-10-CM

## 2019-08-09 ENCOUNTER — Telehealth: Payer: Self-pay | Admitting: Family Medicine

## 2019-08-09 NOTE — Chronic Care Management (AMB) (Signed)
  Chronic Care Management   Note  08/09/2019 Name: Laurie Patel MRN: [id number] DOB: Nov 02, 1927  Laurie Patel is a 86 y.o. year old female who is a primary care patient of Birdie Sons, Yehuda Mao, MD. I reached out to Laurie Patel by phone today in response to a referral sent by Laurie Patel PCP, Dr. Marikay Alar   Patient would like to speak with the Pharmacist as soon as possible regarding her concerns. Pharmacist notified and will attempt to reach patient this week.    Elisha Ponder, LPN Health Advisor, Embedded Care Coordination [place] [facility] ??[email or web address] ??[phone]

## 2019-08-09 NOTE — Telephone Encounter (Signed)
Patient is willing to speak with catie about her regimen.  Laurie Patel,cma

## 2019-08-09 NOTE — Telephone Encounter (Signed)
Referral placed.

## 2019-08-12 ENCOUNTER — Ambulatory Visit (INDEPENDENT_AMBULATORY_CARE_PROVIDER_SITE_OTHER): Payer: Medicare Other | Admitting: Pharmacist

## 2019-08-12 DIAGNOSIS — E119 Type 2 diabetes mellitus without complications: Secondary | ICD-10-CM | POA: Diagnosis not present

## 2019-08-12 NOTE — Chronic Care Management (AMB) (Signed)
Chronic Care Management   Note  08/12/2019 Name: Laurie Patel MRN: [id number] DOB: March 10, 1928   Subjective:  Laurie Patel is a 86 y.o. year old female who is a primary care patient of Laurie Patel, Laurie Adam, MD. The CCM team was consulted for assistance with chronic disease management and care coordination needs.     Ms. Peltz was given information about Chronic Care Management services today including:  1. CCM service includes personalized support from designated clinical staff supervised by her physician, including individualized plan of care and coordination with other care providers 2. 24/7 contact phone numbers for assistance for urgent and routine care needs. 3. Service will only be billed when office clinical staff spend 20 minutes or more in a month to coordinate care. 4. Only one practitioner may furnish and bill the service in a calendar month. 5. The patient may stop CCM services at any time (effective at the end of the month) by phone call to the office staff. 6. The patient will be responsible for cost sharing (co-pay) of up to 20% of the service fee (after annual deductible is met).  Patient agreed to services and verbal consent obtained.   Review of patient status, including review of consultants reports, laboratory and other test data, was performed as part of comprehensive evaluation and provision of chronic care management services.   SDOH (Social Determinants of Health) assessments and interventions performed:  SDOH Interventions     Most Recent Value  SDOH Interventions  SDOH Interventions for the Following Domains  Financial Strain  Financial Strain Interventions  Other (Comment) [Patient assistance application]       Objective:  Lab Results  Component Value Date   CREATININE 0.73 07/13/2019   CREATININE 0.70 01/18/2019   CREATININE 0.70 05/12/2018    Lab Results  Component Value Date   HGBA1C 9.4 (H) 07/13/2019       Component Value Date/Time   CHOL 198 01/18/2019 0800   TRIG 152.0 (H) 01/18/2019 0800   HDL 36.60 (L) 01/18/2019 0800   CHOLHDL 5 01/18/2019 0800   VLDL 30.4 01/18/2019 0800   LDLCALC 131 (H) 01/18/2019 0800   LDLDIRECT 147.0 03/21/2016 0926    Clinical ASCVD: No  The ASCVD Risk score (Goff DC Jr., et al., 2013) failed to calculate for the following reasons:   The 2013 ASCVD risk score is only valid for ages 4 to 57    BP Readings from Last 3 Encounters:  06/21/19 (!) 130/56  02/09/19 (!) 150/60  12/15/18 (!) 148/57    Allergies  Allergen Reactions  . Augmentin [Amoxicillin-Pot Clavulanate] Nausea And Vomiting    Medications Reviewed Today    Reviewed by De Hollingshead, [facility] (Pharmacist) on 08/12/19 at 1700  Med List Status: <None>  Medication Order Taking? Sig Documenting Provider Last Dose Status Informant  aspirin 81 MG EC tablet [id number]  Take 81 mg by mouth daily. Swallow whole. [provider]  Active Self  blood glucose meter kit and supplies KIT [id number]  Dispense based on patient and insurance preference. Use once daily. E11.9. Laurie Haven, MD  Active   Continuous Blood Gluc Receiver (FREESTYLE LIBRE 14 DAY READER) [place] [id number]  1 Device by Does not apply route daily. Laurie Haven, MD  Active   Continuous Blood Gluc Sensor (FREESTYLE LIBRE 14 DAY SENSOR) [place] [id number]  Use one sensory every 14 days to check blood glucose readings. Laurie Haven, MD  Active   empagliflozin (JARDIANCE) 10 MG TABS  tablet [id number] Yes Take 10 mg by mouth daily before breakfast. Laurie Haven, MD Taking Active   Lancets (ONETOUCH DELICA PLUS EFEOFH21F) Laurie Patel [id number]  USE ONE DAILY Laurie Haven, MD  Active   lisinopril (ZESTRIL) 5 MG tablet [id number] Yes TAKE ONE TABLET BY MOUTH DAILY MUST KEEP APPOINTMENT FOR REFILLS Laurie Haven, MD Taking Active   Multiple Vitamins-Calcium (ONE-A-DAY WOMENS PO) [id number]  Take 1 tablet by mouth daily. [provider]   Active Self  Laurie Patel test strip [id number]   [provider]  Active            Assessment:   Goals Addressed            This Visit's Progress     Patient Stated   . PharmD "I can't afford this medication" (pt-stated)       CARE PLAN ENTRY (see longtitudinal plan of care for additional care plan information)  Current Barriers:  . Diabetes: uncontrolled; complicated by chronic medical conditions including HTN, HLD, osteoporosis; most recent A1c 9.4% o Notes that she has felt "washed out and tired" when she wakes up for the previous mornings when she has taken the Jardiance. She has been unable to check BG to determine if this related  o Denies any genitourinary complications . Most recent eGFR: 74 mL/min . Current antihyperglycemic regimen: Jardiance 10 mg daily - reports her pharmacy let her buy 10 tablets for $15 to try out o Reports hx intolerance to metformin, did not state specific reasons why . Current blood glucose readings: reports not checking d/t fingers hurting so much; considering purchasing FreeStyle Libre out of pocket to help review sugars.   . Cardiovascular risk reduction: o Current hypertensive regimen: lisinopril 5 mg daily o Current hyperlipidemia regimen: none o Current antiplatelet regimen: ASA 81 mg daily   Pharmacist Clinical Goal(s):  Marland Kitchen Over the next 90 days, patient will work with PharmD and primary care provider to address optimized medication management  Interventions: . Comprehensive medication review performed, medication list updated in electronic medical record . Reviewed MyFreeStyle free 14 day program. She noted that she signed up once, but then prompted to sign up again and was told that she didn't qualify because she isn't on insulin. This is not a requirement for use of the meter, even if a Medicare requirement for use. Encouraged her to investigate this on the website again.  . Discussed Jardiance patient assistance program.  Discussed patient income. Appears she will qualify for assistance through [employer]. Will collaborate w/ CPhT to mail patient her portion of the application. Will collaborate w/ office staff to get Dr. Ellen Henri signed prescription portion  Patient Self Care Activities:  . Patient will check blood glucose QID using CGM , document, and provide at future appointments . Patient will take medications as prescribed  Initial goal documentation        Plan: - Will collaborate with patient, provider, and CPhT as above - Scheduled f/u call 09/20/19  [name] Darnelle Maffucci, PharmD, BCACP, [place] Pharmacist [place] [phone]

## 2019-08-12 NOTE — Patient Instructions (Signed)
Visit Information  Goals Addressed            This Visit's Progress     Patient Stated   . PharmD "I can't afford this medication" (pt-stated)       CARE PLAN ENTRY (see longtitudinal plan of care for additional care plan information)  Current Barriers:  . Diabetes: uncontrolled; complicated by chronic medical conditions including HTN, HLD, osteoporosis; most recent A1c 9.4% o Notes that she has felt "washed out and tired" when she wakes up for the previous mornings when she has taken the Jardiance. She has been unable to check BG to determine if this related  o Denies any genitourinary complications . Most recent eGFR: 74 mL/min . Current antihyperglycemic regimen: Jardiance 10 mg daily - reports her pharmacy let her buy 10 tablets for $15 to try out o Reports hx intolerance to metformin, did not state specific reasons why . Current blood glucose readings: reports not checking d/t fingers hurting so much; considering purchasing FreeStyle Libre out of pocket to help review sugars.   . Cardiovascular risk reduction: o Current hypertensive regimen: lisinopril 5 mg daily o Current hyperlipidemia regimen: none o Current antiplatelet regimen: ASA 81 mg daily   Pharmacist Clinical Goal(s):  Marland Kitchen Over the next 90 days, patient will work with PharmD and primary care provider to address optimized medication management  Interventions: . Comprehensive medication review performed, medication list updated in electronic medical record . Reviewed MyFreeStyle free 14 day program. She noted that she signed up once, but then prompted to sign up again and was told that she didn't qualify because she isn't on insulin. This is not a requirement for use of the meter, even if a Medicare requirement for use. Encouraged her to investigate this on the website again.  . Discussed Jardiance patient assistance program. Discussed patient income. Appears she will qualify for assistance through [employer].  Will collaborate w/ CPhT to mail patient her portion of the application. Will collaborate w/ office staff to get Dr. Ellen Henri signed prescription portion  Patient Self Care Activities:  . Patient will check blood glucose QID using CGM , document, and provide at future appointments . Patient will take medications as prescribed  Initial goal documentation        Patient verbalizes understanding of instructions provided today.   Plan: - Will collaborate with patient, provider, and CPhT as above - Scheduled f/u call 09/20/19  [name] Darnelle Maffucci, PharmD, BCACP, [place] Pharmacist [place] [phone]

## 2019-08-16 ENCOUNTER — Ambulatory Visit: Payer: Medicare Other | Admitting: Podiatry

## 2019-08-16 ENCOUNTER — Other Ambulatory Visit: Payer: Self-pay

## 2019-08-16 ENCOUNTER — Encounter: Payer: Self-pay | Admitting: Podiatry

## 2019-08-16 DIAGNOSIS — M79676 Pain in unspecified toe(s): Secondary | ICD-10-CM

## 2019-08-16 DIAGNOSIS — B351 Tinea unguium: Secondary | ICD-10-CM | POA: Diagnosis not present

## 2019-08-16 DIAGNOSIS — E119 Type 2 diabetes mellitus without complications: Secondary | ICD-10-CM

## 2019-08-16 NOTE — Progress Notes (Signed)
Complaint:  Visit Type: Patient returns to my office for continued preventative foot care services. Complaint: Patient states" my nails have grown long and thick and become painful to walk and wear shoes" Patient has been diagnosed with DM with no foot complications. The patient presents for preventative foot care services. No changes to ROS  Podiatric Exam: Vascular: dorsalis pedis and posterior tibial pulses are palpable bilateral. Capillary return is immediate. Temperature gradient is WNL. Skin turgor WNL  Sensorium: Normal Semmes Weinstein monofilament test. Normal tactile sensation bilaterally. Nail Exam: Pt has thick disfigured discolored nails with subungual debris noted bilateral entire nail hallux through fifth toenails.  Absence of right hallux toenail noted. Ulcer Exam: There is no evidence of ulcer or pre-ulcerative changes or infection. Orthopedic Exam: Muscle tone and strength are WNL. No limitations in general ROM. No crepitus or effusions noted. Hammer toe second right foot. Bony prominences are unremarkable. Skin: No Porokeratosis. No infection or ulcers  Diagnosis:  Onychomycosis, , Pain in right toe, pain in left toes  Treatment & Plan Procedures and Treatment: Consent by patient was obtained for treatment procedures.   Debridement of mycotic and hypertrophic toenails, 1 through 5 bilateral and clearing of subungual debris. No ulceration, no infection noted.  Return Visit-Office Procedure: Patient instructed to return to the office for a follow up visit 10 weeks  for continued evaluation and treatment.    Helane Gunther DPM

## 2019-08-17 ENCOUNTER — Other Ambulatory Visit: Payer: Self-pay | Admitting: Pharmacy Technician

## 2019-08-17 NOTE — Patient Outreach (Signed)
[facility]) Care Management  08/17/2019  Maudell Stanbrough Hanser May 17, 1928 [id number]                                        Medication Assistance Referral  Referral From: [facility] Embedded RPh [name] T.   Medication/Company: [place] [employer] / [employer] Patient application portion:  Mailed Provider application portion:  N/A Embedded pharmacist to have signed in clinic to Dr. Marikay Alar Provider address/fax verified via: Office website     Follow up:  Will follow up with patient in 5-15 business days to confirm application(s) have been received.  Erico Stan P. Sylva Overley, CPhT Mus[facility] Care Management [phone]

## 2019-08-23 ENCOUNTER — Other Ambulatory Visit: Payer: Self-pay | Admitting: Pharmacy Technician

## 2019-08-23 NOTE — Patient Outreach (Signed)
[facility]) Care Management  08/23/2019  Mahima Hottle Kimes Mar 19, 1928 [id number]    Return call placed to patient regarding patient assistance application(s) for Jardiance with [employer] , HIPAA identifiers verified.   Patient informed she mailed the application on Friday 08/20/2019. She informed me to call her if she did not fill out the application appropriately.Informed patient once I receive the application, I would review it and submit it to the company. Then I would follow up with her about the outcome. Patient verbalized understanding.  Follow up:  Will route note to embedded [facility] RPh [name] Feliz Beam for case closure if document(s) have not been received in the next 15 business days.  Wynonna Fitzhenry P. Kyndal Gloster, CPhT [facility] Care Management [phone]

## 2019-08-26 ENCOUNTER — Other Ambulatory Visit: Payer: Self-pay | Admitting: Pharmacy Technician

## 2019-08-26 NOTE — Patient Outreach (Signed)
[facility]) Care Management  08/26/2019  Laurie Patel 03-Dec-1927 [id number]   Received both patient and provider portion(s) of patient assistance application(s) for Jardiance. Faxed completed application and required documents into [employer].  Will follow up with company(ies) in 7-10 business days to check status of application(s).  Caitland Porchia P. Mathayus Stanbery, CPhT [facility] [employer] [employer]  [phone]

## 2019-08-27 NOTE — Telephone Encounter (Signed)
Pt called she said that she received a Bill for her Prolia injection and was charged $253. Pt stated that she was never told the amount of the injection  She said that if it was going to be this much she will not be getting another  Please contact her back at [phone]

## 2019-08-27 NOTE — Telephone Encounter (Signed)
Called and spoke patient and advised that this price is for 6 months of medication and she says he cannot afford to cancel she will not take Prolia.

## 2019-08-30 ENCOUNTER — Other Ambulatory Visit: Payer: Self-pay | Admitting: Pharmacy Technician

## 2019-08-30 ENCOUNTER — Telehealth: Payer: Self-pay | Admitting: Family Medicine

## 2019-08-30 NOTE — Telephone Encounter (Signed)
Pt called in and said she is not taking the Jardiance anymore it is bothering her stomach. She said just let Dr. Birdie Sons know.

## 2019-08-30 NOTE — Telephone Encounter (Signed)
Noted. Is she willing to try anything else for her diabetes?

## 2019-08-30 NOTE — Patient Outreach (Signed)
[facility]) Care Management  08/30/2019  Silvia Hightower Valent [birth year] [id number]    Incoming call received from patient in regards to [facility] application for Jardiance.  HIPAA identifiers verified.  Patient called to inform that she was having some GI issues with the Jardiance. She informed she has had some diarrhea. She informed she has not taken the medication for 2 days and feels great today.  She also informed she was not going to be taking the Prolia injections anymore either. She informed the injection costs around $250 per injection.  Informed patient I would outreach embedded [facility] RPh [name] Feliz Beam with her concerns and someone would be outreaching her on the next steps.  Zygmunt Mcglinn P. Unity Luepke, CPhT [facility] [employer]  [phone]

## 2019-08-30 NOTE — Telephone Encounter (Signed)
See note from CPhT today:   "Patient called to inform that she was having some GI issues with the Jardiance. She informed she has had some diarrhea. She informed she has not taken the medication for 2 days and feels great today.  She also informed she was not going to be taking the Prolia injections anymore either. She informed the injection costs around $250 per injection"  Laurie Patel, please do see if she is willing to to discuss other options. I am planning on trying to call her later this week to discuss, as well as cost options for the Prolia.   [name]

## 2019-08-31 NOTE — Telephone Encounter (Signed)
If her stomach issues have resolved with stopping Boost, then please ask her to resume Jardiance. GI side effects are not typical with that class of medications.   Will route to CPhT to continue on Jardiance patient assistance application

## 2019-08-31 NOTE — Telephone Encounter (Signed)
I called the patient and patient states she is ok with other options for her diabetes and the prolia.  I informed her that [name] will call her later this week.  Patient stated she had no issues with the Jardiance.until she started drinking the boost with [facility], she wonders if this could be why she was having GI issues. She did stop the boost and she feels much better.  If you think this is why she has had GI issue she would like to stay on the Jardiance.  Trase Bunda,cma

## 2019-09-02 ENCOUNTER — Ambulatory Visit (INDEPENDENT_AMBULATORY_CARE_PROVIDER_SITE_OTHER): Payer: Medicare Other | Admitting: Pharmacist

## 2019-09-02 ENCOUNTER — Telehealth: Payer: Self-pay

## 2019-09-02 DIAGNOSIS — M810 Age-related osteoporosis without current pathological fracture: Secondary | ICD-10-CM

## 2019-09-02 DIAGNOSIS — E119 Type 2 diabetes mellitus without complications: Secondary | ICD-10-CM

## 2019-09-02 NOTE — Chronic Care Management (AMB) (Signed)
Chronic Care Management   Follow Up Note   09/02/2019 Name: Laurie Patel MRN: [id number] DOB: 1928/02/16  Referred by: Leone Haven, MD Reason for referral : Chronic Care Management (Medication Management)   Laurie Patel is a 86 y.o. year old female who is a primary care patient of [name], [name], MD. The CCM team was consulted for assistance with chronic disease management and care coordination needs.    Contacted patient to discuss recent medication questions.   Review of patient status, including review of consultants reports, relevant laboratory and other test results, and collaboration with appropriate care team members and the patient's provider was performed as part of comprehensive patient evaluation and provision of chronic care management services.    SDOH (Social Determinants of Health) assessments performed: Yes See Care Plan activities for detailed interventions related to [facility])     Outpatient Encounter Medications as of 09/02/2019  Medication Sig  . empagliflozin (JARDIANCE) 10 MG TABS tablet Take 10 mg by mouth daily.  Marland Kitchen aspirin 81 MG EC tablet Take 81 mg by mouth daily. Swallow whole.  . blood glucose meter kit and supplies KIT Dispense based on patient and insurance preference. Use once daily. E11.9.  . Continuous Blood Gluc Receiver (FREESTYLE LIBRE 14 DAY READER) [place] 1 Device by Does not apply route daily.  . Continuous Blood Gluc Sensor (FREESTYLE LIBRE 14 DAY SENSOR) MISC Use one sensory every 14 days to check blood glucose readings.  . Lancets (ONETOUCH DELICA PLUS [id number]) MISC USE ONE DAILY  . lisinopril (ZESTRIL) 5 MG tablet TAKE ONE TABLET BY MOUTH DAILY MUST KEEP APPOINTMENT FOR REFILLS  . Multiple Vitamins-Calcium (ONE-A-DAY WOMENS PO) Take 1 tablet by mouth daily.  Glory Rosebush VERIO test strip    No facility-administered encounter medications on file as of 09/02/2019.     Objective:   Goals Addressed            This Visit's  Progress     Patient Stated   . PharmD "I can't afford this medication" (pt-stated)       CARE PLAN ENTRY (see longtitudinal plan of care for additional care plan information)  Current Barriers:  . Diabetes: uncontrolled; complicated by chronic medical conditions including HTN, HLD, osteoporosis; most recent A1c 9.4% o Stopped Jardiance Sunday because she worried about diarrhea, but questioned whether it was Jardiance or Boost. Has since stopped both, and diarrhea has resolved  o CPhT submitted patient assistance application on 8/0/99 for Jardiance . Most recent eGFR: 74 mL/min . Current antihyperglycemic regimen: Jardiance 10 mg daily - has been holding; has 11 pills left from what she previously purchased.  o Reports hx intolerance to metformin, did not state specific reasons why . Current blood glucose readings: has started using [employer]. Loves using it to review glucose readings throughout the day. Notes her first sensor fell off after 9 days, so wait the next 5 before placing another one, which was last night.  o Lowest 121;  o Fasting this morning 169 (steak, baked potatoes last night) . Cardiovascular risk reduction: o Current hypertensive regimen: lisinopril 5 mg daily o Current hyperlipidemia regimen: none o Current antiplatelet regimen: ASA 81 mg daily  . Osteoporosis: T score -3.8 at wrist, -3.2 at femoral neck. Prolia 60 mcg Q6 months. Patient very concerned about the cost of ~$250 for this injection, would like to stop this therapy. Receives from clinic, billed under Medicare Part B  Pharmacist Clinical Goal(s):  Marland Kitchen Over the next  90 days, patient will work with PharmD and primary care provider to address optimized medication management  Interventions: . Reviewed that GI upset is not a common side effect of Jardiance. Patient would like to wait a few more days to restart Jardiance to ensure stomach is not upset, but then she will do so. Notes that she was tolerating  Jardiance well before this most recent GI upset episode, so thinks she will be OK restarting. Will continue to collaborate w/ CPhT regarding patient assistance.  . Discussed goal A1c <8.5% given age, however, patient is still functional and independent. Noted she spent all day outside yesterday gardening for her birthday. Discussed importance of controlling sugar to prevent microvascular complications of DM, including poor wound healing.  . Discussed most recent DEXA, recommendations, and role of Prolia in bone health. Reviewed that goal is to prevent fractures if she were to fall, to prevent significant loss of independence. Discussed looking at cost as $40/month ($250/6 months) to reduce risk of losing independence, particularly given her active lifestyle and two dogs that live with her (and previously caused her to trip and break a wrist). Patient noted that she would consider this. Uncertain if sending prescription to regular pharmacy to be billed under Part D w/ possible copay assistance with [place] would be an option, since drug is covered under Part B. Last injection was 07/13/19, so decision does not need to be made until ~July.   Patient Self Care Activities:  . Patient will check blood glucose QID using CGM , document, and provide at future appointments . Patient will take medications as prescribed  Please see past updates related to this goal by clicking on the "Past Updates" button in the selected goal          Plan:  - Will outreach patient as previously scheduled  [name] Darnelle Maffucci, PharmD, [place], [place] Pharmacist [place] [phone]

## 2019-09-02 NOTE — Patient Instructions (Signed)
Visit Information  Goals Addressed            This Visit's Progress     Patient Stated   . PharmD "I can't afford this medication" (pt-stated)       CARE PLAN ENTRY (see longtitudinal plan of care for additional care plan information)  Current Barriers:  . Diabetes: uncontrolled; complicated by chronic medical conditions including HTN, HLD, osteoporosis; most recent A1c 9.4% o Stopped Jardiance Sunday because she worried about diarrhea, but questioned whether it was Jardiance or Boost. Has since stopped both, and diarrhea has resolved  o CPhT submitted patient assistance application on 09/23/57 for Jardiance . Most recent eGFR: 74 mL/min . Current antihyperglycemic regimen: Jardiance 10 mg daily - has been holding; has 11 pills left from what she previously purchased.  o Reports hx intolerance to metformin, did not state specific reasons why . Current blood glucose readings: has started using [employer]. Loves using it to review glucose readings throughout the day. Notes her first sensor fell off after 9 days, so wait the next 5 before placing another one, which was last night.  o Lowest 121;  o Fasting this morning 169 (steak, baked potatoes last night) . Cardiovascular risk reduction: o Current hypertensive regimen: lisinopril 5 mg daily o Current hyperlipidemia regimen: none o Current antiplatelet regimen: ASA 81 mg daily  . Osteoporosis: T score -3.8 at wrist, -3.2 at femoral neck. Prolia 60 mcg Q6 months. Patient very concerned about the cost of ~$250 for this injection, would like to stop this therapy. Receives from clinic, billed under Medicare Part B  Pharmacist Clinical Goal(s):  Marland Kitchen Over the next 90 days, patient will work with PharmD and primary care provider to address optimized medication management  Interventions: . Reviewed that GI upset is not a common side effect of Jardiance. Patient would like to wait a few more days to restart Jardiance to ensure stomach is not  upset, but then she will do so. Notes that she was tolerating Jardiance well before this most recent GI upset episode, so thinks she will be OK restarting. Will continue to collaborate w/ CPhT regarding patient assistance.  . Discussed goal A1c <8.5% given age, however, patient is still functional and independent. Noted she spent all day outside yesterday gardening for her birthday. Discussed importance of controlling sugar to prevent microvascular complications of DM, including poor wound healing.  . Discussed most recent DEXA, recommendations, and role of Prolia in bone health. Reviewed that goal is to prevent fractures if she were to fall, to prevent significant loss of independence. Discussed looking at cost as $40/month ($250/6 months) to reduce risk of losing independence, particularly given her active lifestyle and two dogs that live with her (and previously caused her to trip and break a wrist). Patient noted that she would consider this. Uncertain if sending prescription to regular pharmacy to be billed under Part D w/ possible copay assistance with [place] would be an option, since drug is covered under Part B. Last injection was 07/13/19, so decision does not need to be made until ~July.   Patient Self Care Activities:  . Patient will check blood glucose QID using CGM , document, and provide at future appointments . Patient will take medications as prescribed  Please see past updates related to this goal by clicking on the "Past Updates" button in the selected goal         Patient verbalizes understanding of instructions provided today.   Plan:  -  Will outreach patient as previously scheduled  [name] Darnelle Maffucci, PharmD, [place], [place] Pharmacist [place] [phone]

## 2019-09-03 NOTE — Telephone Encounter (Signed)
Per PCP ok to try Evenity I will get authorization and see if they advise of co-pay if not we could have patient call insurance prior to injection for Out of pocket cost.

## 2019-09-03 NOTE — Telephone Encounter (Signed)
Will make a note to circle back in ~5 months. Thanks!

## 2019-09-03 NOTE — Telephone Encounter (Signed)
I called and informed the patient to start back on the jardiance per catie the pharmacist and she stated she understood.  Rowin Bayron,cma

## 2019-09-03 NOTE — Telephone Encounter (Signed)
Laurie Patel, let's try it! Ca and Vit D levels are appropriate on last check, no hx ASCVD in the last year.  Do you need me to write a script?

## 2019-09-03 NOTE — Telephone Encounter (Signed)
I think the evenity would be an ok option for her if she is willing to take it.

## 2019-09-03 NOTE — Telephone Encounter (Signed)
Not yet because I don't think she can start right away since she just had the Prolia and I can add the script when we are ready. Thanks.

## 2019-09-03 NOTE — Telephone Encounter (Signed)
Dr. Birdie Sons stated the Sheilah Mins would be ok you can start and see if the patient will try that one.

## 2019-09-09 ENCOUNTER — Other Ambulatory Visit: Payer: Self-pay | Admitting: Pharmacy Technician

## 2019-09-09 ENCOUNTER — Other Ambulatory Visit: Payer: Self-pay | Admitting: Family Medicine

## 2019-09-09 NOTE — Patient Outreach (Signed)
[facility]) Care Management  09/09/2019  Laurie Patel [birth year] [id number]  Care coordination call placed to BI in regards to [name] application.  Spoke to [name] who informed patient is APPROVED 09/01/2019-06/23/2020. She informed 90 days supply of medication was delivered to the patient's home address on 09/08/2019. Will outreach patient with this information.  Takira Sherrin P. Annalicia Renfrew, CPhT [facility]  [phone]

## 2019-09-09 NOTE — Patient Outreach (Signed)
[facility]) Care Management  09/09/2019  Laurie Patel [birth year] [id number]  ADDENDUM   Successful call placed to patient regarding patient assistance medication delivery of Jardiance from [facility], HIPAA identifiers verified.   Patient informed receiving a 90 days supply of medication. Discussed refill procedure with patient which involves patient calling BI to request a refill approximately 2-3 weeks before running out of medication. Patient was able to locate the phone number to call. Confirmed patient had name and number as she denies having any other questions or concerns at this time.  Follow up:  Will route note to embedded [facility] RPh [name] Feliz Beam for case closure as patient assistance has been completed.  Geoffry Bannister P. Krishang Reading, CPhT [facility]  [phone]

## 2019-09-20 ENCOUNTER — Ambulatory Visit: Payer: Medicare Other | Admitting: Pharmacist

## 2019-09-20 DIAGNOSIS — M810 Age-related osteoporosis without current pathological fracture: Secondary | ICD-10-CM

## 2019-09-20 DIAGNOSIS — E119 Type 2 diabetes mellitus without complications: Secondary | ICD-10-CM

## 2019-09-20 NOTE — Patient Instructions (Signed)
Visit Information  Goals Addressed            This Visit's Progress     Patient Stated   . PharmD "I can't afford this medication" (pt-stated)       CARE PLAN ENTRY (see longtitudinal plan of care for additional care plan information)  Current Barriers:  . Diabetes: uncontrolled; complicated by chronic medical conditions including HTN, HLD, osteoporosis; most recent A1c 9.4% o Reports tolerating Jardiance 10 mg daily well, denies any genitourinary irritation . Most recent eGFR: 74 mL/min . Current antihyperglycemic regimen: Jardiance 10 mg daily o Receiving Jardinace from [place] patient assistance through 06/23/20 . Current blood glucose readings: FreeStyle Libre, results are not downloaded and linked through [place] for my viewing, but she verbally reviews LogBook feature o Reports readings 140-160s the majority of the time  . Cardiovascular risk reduction: o Current hypertensive regimen: lisinopril 5 mg daily o Current hyperlipidemia regimen: none o Current antiplatelet regimen: ASA 81 mg daily  . Osteoporosis: T score -3.8 at wrist, -3.2 at femoral neck. Prolia 60 mcg Q6 months. Patient very concerned about the cost of ~$250 for this injection, would like to stop this therapy. Receives from clinic, billed under Medicare Part B. Will circle back about option for switching to [facility] in June  Pharmacist Clinical Goal(s):  Marland Kitchen Over the next 90 days, patient will work with PharmD and primary care provider to address optimized medication management  Interventions: . Comprehensive medication review performed, medication list updated in electronic medical record . Reviewed likely goal A1c <8%. Discussed that we could consider increasing Jardiance to 25 mg dose in the future if needed; patient adamantly declines increasing the dose at any point in the future. Will follow A1c.  . Encouraged continued focus on reducing portion sizes of carbohydrates and adherence to medications.    Patient Self Care Activities:  . Patient will check blood glucose QID using CGM , document, and provide at future appointments . Patient will take medications as prescribed  Please see past updates related to this goal by clicking on the "Past Updates" button in the selected goal         Patient verbalizes understanding of instructions provided today.  Plan:  - Scheduled f/u call 11/29/19  [name] Darnelle Maffucci, PharmD, BCACP, CPP Clinical Pharmacist [place] [phone]

## 2019-09-20 NOTE — Chronic Care Management (AMB) (Signed)
Chronic Care Management   Follow Up Note   09/20/2019 Name: Laurie Patel MRN: [id number] DOB: 10/01/1927  Referred by: Leone Haven, MD Reason for referral : Chronic Care Management (Medication Management)   Laurie Patel is a 86 y.o. year old female who is a primary care patient of [name], [name], MD. The CCM team was consulted for assistance with chronic disease management and care coordination needs.    Contacted patient for medication management review.   Review of patient status, including review of consultants reports, relevant laboratory and other test results, and collaboration with appropriate care team members and the patient's provider was performed as part of comprehensive patient evaluation and provision of chronic care management services.    SDOH (Social Determinants of Health) assessments performed: Yes See Care Plan activities for detailed interventions related to [facility])     Outpatient Encounter Medications as of 09/20/2019  Medication Sig  . empagliflozin (JARDIANCE) 10 MG TABS tablet Take 10 mg by mouth daily.  Marland Kitchen aspirin 81 MG EC tablet Take 81 mg by mouth daily. Swallow whole.  . blood glucose meter kit and supplies KIT Dispense based on patient and insurance preference. Use once daily. E11.9.  . Continuous Blood Gluc Receiver (FREESTYLE LIBRE 14 DAY READER) DEVI USE WITH SENSORS TO MONITOR BLOOD GLUCOSE  . Continuous Blood Gluc Sensor (FREESTYLE LIBRE 14 DAY SENSOR) MISC Use one sensory every 14 days to check blood glucose readings.  . Lancets (ONETOUCH DELICA PLUS [id number]) MISC USE ONE DAILY  . lisinopril (ZESTRIL) 5 MG tablet TAKE ONE TABLET BY MOUTH DAILY MUST KEEP APPOINTMENT FOR REFILLS  . Multiple Vitamins-Calcium (ONE-A-DAY WOMENS PO) Take 1 tablet by mouth daily.  Glory Rosebush VERIO test strip    No facility-administered encounter medications on file as of 09/20/2019.     Objective:   Goals Addressed            This Visit's Progress     Patient Stated   . PharmD "I can't afford this medication" (pt-stated)       CARE PLAN ENTRY (see longtitudinal plan of care for additional care plan information)  Current Barriers:  . Diabetes: uncontrolled; complicated by chronic medical conditions including HTN, HLD, osteoporosis; most recent A1c 9.4% o Reports tolerating Jardiance 10 mg daily well, denies any genitourinary irritation . Most recent eGFR: 74 mL/min . Current antihyperglycemic regimen: Jardiance 10 mg daily o Receiving Jardinace from [place] patient assistance through 06/23/20 . Current blood glucose readings: FreeStyle Libre, results are not downloaded and linked through [place] for my viewing, but she verbally reviews LogBook feature o Reports readings 140-160s the majority of the time  . Cardiovascular risk reduction: o Current hypertensive regimen: lisinopril 5 mg daily o Current hyperlipidemia regimen: none o Current antiplatelet regimen: ASA 81 mg daily  . Osteoporosis: T score -3.8 at wrist, -3.2 at femoral neck. Prolia 60 mcg Q6 months. Patient very concerned about the cost of ~$250 for this injection, would like to stop this therapy. Receives from clinic, billed under Medicare Part B. Will circle back about option for switching to [facility] in June  Pharmacist Clinical Goal(s):  Marland Kitchen Over the next 90 days, patient will work with PharmD and primary care provider to address optimized medication management  Interventions: . Comprehensive medication review performed, medication list updated in electronic medical record . Reviewed likely goal A1c <8%. Discussed that we could consider increasing Jardiance to 25 mg dose in the future if needed; patient adamantly declines  increasing the dose at any point in the future. Will follow A1c.  . Encouraged continued focus on reducing portion sizes of carbohydrates and adherence to medications.   Patient Self Care Activities:  . Patient will check blood glucose QID using CGM ,  document, and provide at future appointments . Patient will take medications as prescribed  Please see past updates related to this goal by clicking on the "Past Updates" button in the selected goal          Plan:  - Scheduled f/u call 11/29/19  [name] Darnelle Maffucci, PharmD, BCACP, [place] Pharmacist [place] [phone]

## 2019-10-07 ENCOUNTER — Telehealth: Payer: Self-pay | Admitting: Family Medicine

## 2019-10-07 NOTE — Telephone Encounter (Signed)
Pt called and wanted to tell Dr Birdie Sons that she has developed a yeast infection. Pt is not sure if he wants to call her in any medication. She just wanted him to be informed.

## 2019-10-08 MED ORDER — FLUCONAZOLE 150 MG PO TABS: 150.0000 mg | ORAL_TABLET | Freq: Once | ORAL | 0 refills | Status: AC

## 2019-10-08 NOTE — Telephone Encounter (Signed)
Patient called and stated that she has a yeast infection, it is not severe but some itching and she stated it started aftr her UTI and starting on the Jardiance. She wanted to see if you would send in medication.  Please advise.  Virl Coble,cma

## 2019-10-08 NOTE — Telephone Encounter (Signed)
Diflucan sent to pharmacy. If this is not helpful she will need to be seen in the office for evaluation.

## 2019-10-08 NOTE — Telephone Encounter (Signed)
I called the patient and informed her that the medication for her yeast infection was ent to pharmacy and if she does not get better call for an appt.  Rhen Dossantos,cma

## 2019-10-18 ENCOUNTER — Telehealth: Payer: Self-pay | Admitting: Family Medicine

## 2019-10-18 NOTE — Telephone Encounter (Signed)
Pt states that she is going to stop empagliflozin (JARDIANCE) 10 MG TABS tablet because it is making her feel sick. She states that she is gassy, constipated and light headed. Please advise

## 2019-10-18 NOTE — Telephone Encounter (Signed)
Noted  

## 2019-10-18 NOTE — Telephone Encounter (Signed)
I called and spoke to the patient and asked her if she was willing to try another diabetic medications and the patient stated no she will not. Patient stated she is seeing the provider at the end of next month and she may change her mind but right now she does not want any other medications.  Brittay Mogle,cma

## 2019-10-18 NOTE — Telephone Encounter (Signed)
Noted. I will forward to [name] so she is aware as well. Coralee North, can you find out if the patient is willing to try any other diabetic medications?

## 2019-10-25 ENCOUNTER — Other Ambulatory Visit: Payer: Self-pay | Admitting: Family Medicine

## 2019-10-25 ENCOUNTER — Ambulatory Visit: Payer: Medicare Other | Admitting: Family Medicine

## 2019-11-01 ENCOUNTER — Other Ambulatory Visit: Payer: Self-pay

## 2019-11-01 ENCOUNTER — Encounter: Payer: Self-pay | Admitting: Podiatry

## 2019-11-01 ENCOUNTER — Ambulatory Visit: Payer: Medicare Other | Admitting: Podiatry

## 2019-11-01 DIAGNOSIS — B351 Tinea unguium: Secondary | ICD-10-CM | POA: Diagnosis not present

## 2019-11-01 DIAGNOSIS — M79676 Pain in unspecified toe(s): Secondary | ICD-10-CM | POA: Diagnosis not present

## 2019-11-01 DIAGNOSIS — E119 Type 2 diabetes mellitus without complications: Secondary | ICD-10-CM | POA: Diagnosis not present

## 2019-11-01 NOTE — Progress Notes (Signed)
This patient returns to my office for at risk foot care.  This patient requires this care by a professional since this patient will be at risk due to having diabetes.   This patient is unable to cut nails herself since the patient cannot reach her nails.These nails are painful walking and wearing shoes.  This patient presents for at risk foot care today.  General Appearance  Alert, conversant and in no acute stress.  Vascular  Dorsalis pedis and posterior tibial  pulses are palpable  bilaterally.  Capillary return is within normal limits  bilaterally. Temperature is within normal limits  bilaterally.  Neurologic  Senn-Weinstein monofilament wire test within normal limits  bilaterally. Muscle power within normal limits bilaterally.  Nails Thick disfigured discolored nails with subungual debris  from hallux to fifth toes bilaterally.  Absence of right hallux nail. No evidence of bacterial infection or drainage bilaterally.  Orthopedic  No limitations of motion  feet .  No crepitus or effusions noted.  No bony pathology or digital deformities noted.  Skin  normotropic skin with no porokeratosis noted bilaterally.  No signs of infections or ulcers noted.     Onychomycosis  Pain in right toes  Pain in left toes  Consent was obtained for treatment procedures.   Mechanical debridement of nails 1-5  bilaterally performed with a nail nipper.  Filed with dremel without incident.    Return office visit  10 weeks                    Told patient to return for periodic foot care and evaluation due to potential at risk complications.   Helane Gunther DPM

## 2019-11-09 ENCOUNTER — Ambulatory Visit (INDEPENDENT_AMBULATORY_CARE_PROVIDER_SITE_OTHER): Payer: Medicare Other | Admitting: Family Medicine

## 2019-11-09 ENCOUNTER — Encounter: Payer: Self-pay | Admitting: Family Medicine

## 2019-11-09 ENCOUNTER — Other Ambulatory Visit: Payer: Self-pay

## 2019-11-09 DIAGNOSIS — L989 Disorder of the skin and subcutaneous tissue, unspecified: Secondary | ICD-10-CM | POA: Diagnosis not present

## 2019-11-09 DIAGNOSIS — H9201 Otalgia, right ear: Secondary | ICD-10-CM | POA: Diagnosis not present

## 2019-11-09 DIAGNOSIS — I10 Essential (primary) hypertension: Secondary | ICD-10-CM

## 2019-11-09 DIAGNOSIS — E119 Type 2 diabetes mellitus without complications: Secondary | ICD-10-CM

## 2019-11-09 LAB — POCT GLYCOSYLATED HEMOGLOBIN (HGB A1C): Hemoglobin A1C: 8.3 % — AB (ref 4.0–5.6)

## 2019-11-09 NOTE — Assessment & Plan Note (Addendum)
A1c has improved though still not quite at goal.  Patient reports she is lost some weight with eating less quantity.  She notes she will be more active as it warms up and wants to avoid medication at this time.  We will plan to recheck in 4 months.  An appropriate goal may be less than 8 for her A1c.

## 2019-11-09 NOTE — Assessment & Plan Note (Addendum)
Has been well controlled.  Diastolic borderline low on recheck.  She will continue with her current dose of lisinopril and she will monitor.  We will allow systolic blood pressure to run a little higher given age.

## 2019-11-09 NOTE — Patient Instructions (Signed)
Nice to see you. Please let us know if you like to see dermatologist in the future. Please continue to stay active and monitor your diet.

## 2019-11-09 NOTE — Assessment & Plan Note (Signed)
Chronic issue.  She has already been evaluated by ENT.  Benign exam.  She will monitor.

## 2019-11-09 NOTE — Progress Notes (Signed)
Marikay Alar, MD Phone: [phone]  Laurie Patel is a 86 y.o. female who presents today for f/u  DIABETES Disease Monitoring: Blood Sugar ranges-30 day avg 173 Polyuria/phagia/dipsia- no      Optho- due, she will schedule an appointment Medications: Compliance- no medications, could not tolerate jardiance due to constipation Hypoglycemic symptoms- no  HYPERTENSION  Disease Monitoring  Home BP Monitoring not checking Chest pain- no    Dyspnea- no Medications  Compliance-  Taking lisinopril.  Edema- no  Right ear discomfort: This is an intermittent ongoing issue.  She notes at times if she sleeps on it it will get sore and swollen and then it will go back down.  Her hearing is back to her baseline.  Occasional dizzy spells which have been chronic and ongoing.  She has seen ENT locally and at [facility] for this.  Skin lesions: Patient notes numerous nevi and seborrheic keratoses.  She used to see dermatology but has not seen them in the last year or 2.  She notes she has had no changes to any of her skin lesions.  She notes the nevus on her right neck has not changed in many years.  She notes the seborrheic keratoses on her left neck have not changed in several years.    Social History   Tobacco Use  Smoking Status Never Smoker  Smokeless Tobacco Never Used     ROS see history of present illness  Objective  Physical Exam Vitals:   11/09/19 0826 11/09/19 0842  BP: (!) 150/70 (!) 160/60  Pulse: 84   Temp: (!) 97.5 F (36.4 C)   SpO2: 95%     BP Readings from Last 3 Encounters:  11/09/19 (!) 160/60  06/21/19 (!) 130/56  02/09/19 (!) 150/60   Wt Readings from Last 3 Encounters:  11/09/19 155 lb (70.3 kg)  07/08/19 156 lb (70.8 kg)  06/21/19 156 lb (70.8 kg)    Physical Exam Constitutional:      General: She is not in acute distress.    Appearance: She is not diaphoretic.  HENT:     Right Ear: Tympanic membrane and ear canal normal.     Left Ear:  Tympanic membrane and ear canal normal.  Cardiovascular:     Rate and Rhythm: Normal rate and regular rhythm.     Heart sounds: Normal heart sounds.  Pulmonary:     Effort: Pulmonary effort is normal.     Breath sounds: Normal breath sounds.  Skin:    General: Skin is warm and dry.     Comments: Nevus right neck, seborrheic keratosis x2 on left neck and upper chest near her collarbone  Neurological:     Mental Status: She is alert.      Assessment/Plan: Please see individual problem list.  Diabetes (HCC) A1c has improved though still not quite at goal.  Patient reports she is lost some weight with eating less quantity.  She notes she will be more active as it warms up and wants to avoid medication at this time.  We will plan to recheck in 4 months.  An appropriate goal may be less than 8 for her A1c.  Skin lesions Nevus and seborrheic keratoses appear benign given numerous skin lesions I did offer referral to dermatology though she declined this given lack of change.  She will monitor and if there is any change to any of her lesion she will let us know so we can refer to dermatology.  Ear discomfort,  right Chronic issue.  She has already been evaluated by ENT.  Benign exam.  She will monitor.  HTN (hypertension) Has been well controlled.  Diastolic borderline low on recheck.  She will continue with her current dose of lisinopril and she will monitor.  We will allow systolic blood pressure to run a little higher given age.   No orders of the defined types were placed in this encounter.   No orders of the defined types were placed in this encounter.   This visit occurred during the SARS-CoV-2 public health emergency.  Safety protocols were in place, including screening questions prior to the visit, additional usage of staff PPE, and extensive cleaning of exam room while observing appropriate contact time as indicated for disinfecting solutions.    Tommi Rumps, MD [place]

## 2019-11-09 NOTE — Addendum Note (Signed)
Addended by: Charlyne Mom D on: 11/09/2019 10:42 AM   Modules accepted: Orders

## 2019-11-09 NOTE — Assessment & Plan Note (Signed)
Nevus and seborrheic keratoses appear benign given numerous skin lesions I did offer referral to dermatology though she declined this given lack of change.  She will monitor and if there is any change to any of her lesion she will let us know so we can refer to dermatology.

## 2019-11-26 ENCOUNTER — Other Ambulatory Visit: Payer: Self-pay

## 2019-11-29 ENCOUNTER — Ambulatory Visit (INDEPENDENT_AMBULATORY_CARE_PROVIDER_SITE_OTHER): Payer: Medicare Other | Admitting: Pharmacist

## 2019-11-29 DIAGNOSIS — I10 Essential (primary) hypertension: Secondary | ICD-10-CM | POA: Diagnosis not present

## 2019-11-29 DIAGNOSIS — E119 Type 2 diabetes mellitus without complications: Secondary | ICD-10-CM | POA: Diagnosis not present

## 2019-11-29 NOTE — Chronic Care Management (AMB) (Signed)
Chronic Care Management   Follow Up Note   11/29/2019 Name: Laurie Patel MRN: [id number] DOB: 24-Feb-1928  Referred by: Leone Haven, MD Reason for referral : Chronic Care Management (Medication Management)   Laurie Patel is a 86 y.o. year old female who is a primary care patient of [name], [name], MD. The CCM team was consulted for assistance with chronic disease management and care coordination needs.    Contacted patient for medication management review.   Review of patient status, including review of consultants reports, relevant laboratory and other test results, and collaboration with appropriate care team members and the patient's provider was performed as part of comprehensive patient evaluation and provision of chronic care management services.    SDOH (Social Determinants of Health) assessments performed: No See Care Plan activities for detailed interventions related to [facility])     Outpatient Encounter Medications as of 11/29/2019  Medication Sig  . aspirin 81 MG EC tablet Take 81 mg by mouth daily. Swallow whole.  . blood glucose meter kit and supplies KIT Dispense based on patient and insurance preference. Use once daily. E11.9.  . Continuous Blood Gluc Receiver (FREESTYLE LIBRE 14 DAY READER) DEVI USE WITH SENSORS TO MONITOR BLOOD GLUCOSE  . Continuous Blood Gluc Sensor (FREESTYLE LIBRE 14 DAY SENSOR) MISC Use one sensory every 14 days to check blood glucose readings.  . Lancets (ONETOUCH DELICA PLUS [id number]) MISC USE ONE DAILY  . lisinopril (ZESTRIL) 5 MG tablet TAKE ONE TABLET BY MOUTH DAILY *MUST KEEP APPOINTMENT FOR REFILLS*  . Multiple Vitamins-Calcium (ONE-A-DAY WOMENS PO) Take 1 tablet by mouth daily.  Glory Rosebush VERIO test strip    No facility-administered encounter medications on file as of 11/29/2019.     Objective:   Goals Addressed            This Visit's Progress     Patient Stated   . PharmD "I can't afford this medication" (pt-stated)        CARE PLAN ENTRY (see longtitudinal plan of care for additional care plan information)  Current Barriers:  . Diabetes: uncontrolled; complicated by chronic medical conditions including HTN, HLD, osteoporosis; most recent A1c 8.3% o W/ PCP, Jardiance d/c d/t constant constipation. Constipation relieved since stopping Jardiance . Most recent eGFR: 74 mL/min . Current antihyperglycemic regimen: none . Current blood glucose readings:  o Random readings on [employer]  . Current meal patterns readings o Breakfast: non-sugary cereal w/ blueberries o Lunch: scrambled egg, onion, 1 piece of toast; protein shake w/ english muffin o Supper: 3 nights per week, will have fish sandwich, cole slaw; . Cardiovascular risk reduction: o Current hypertensive regimen: lisinopril 5 mg daily; BP o Current hyperlipidemia regimen: none o Current antiplatelet regimen: ASA 81 mg daily  . Osteoporosis: T score -3.8 at wrist, -3.2 at femoral neck. Received one dose of Prolia, but was expensive. She declines to continue therapy. Discussed alternative options, such as Evenity, w/ provider.   Pharmacist Clinical Goal(s):  Marland Kitchen Over the next 90 days, patient will work with PharmD and primary care provider to address optimized medication management  Interventions: . Comprehensive medication review performed, medication list updated in electronic medical record . Inter-disciplinary care team collaboration (see longitudinal plan of care) . Reviewed goal A1c <8%. Patient notes that she plans to continue to engage in regular exercise to continue to work on weight loss. Has cut back on carbohydrate portion sizes . Declines any alternative osteoporosis therapy at this time, as she  notes she is not concerned about falls/fractures. Encouraged to continue to contemplate and discuss risk vs benefit w/ PCP.   Patient Self Care Activities:  . Patient will check blood glucose QID using CGM , document, and provide at future  appointments . Patient will take medications as prescribed  Please see past updates related to this goal by clicking on the "Past Updates" button in the selected goal          Plan:  - Patient has my contact information for future questions or concerns. I would be happy to re-engage with patient in the future if needed.   [name] Darnelle Maffucci, PharmD, [place], CPP Clinical Pharmacist [place] [phone]

## 2019-11-29 NOTE — Patient Instructions (Signed)
Visit Information  Goals Addressed            This Visit's Progress     Patient Stated   . PharmD "I can't afford this medication" (pt-stated)       CARE PLAN ENTRY (see longtitudinal plan of care for additional care plan information)  Current Barriers:  . Diabetes: uncontrolled; complicated by chronic medical conditions including HTN, HLD, osteoporosis; most recent A1c 8.3% o W/ PCP, Jardiance d/c d/t constant constipation. Constipation relieved since stopping Jardiance . Most recent eGFR: 74 mL/min . Current antihyperglycemic regimen: none . Current blood glucose readings:  o Random readings on [employer]  . Current meal patterns readings o Breakfast: non-sugary cereal w/ blueberries o Lunch: scrambled egg, onion, 1 piece of toast; protein shake w/ english muffin o Supper: 3 nights per week, will have fish sandwich, cole slaw; . Cardiovascular risk reduction: o Current hypertensive regimen: lisinopril 5 mg daily; BP o Current hyperlipidemia regimen: none o Current antiplatelet regimen: ASA 81 mg daily  . Osteoporosis: T score -3.8 at wrist, -3.2 at femoral neck. Received one dose of Prolia, but was expensive. She declines to continue therapy. Discussed alternative options, such as Evenity, w/ provider.   Pharmacist Clinical Goal(s):  Marland Kitchen Over the next 90 days, patient will work with PharmD and primary care provider to address optimized medication management  Interventions: . Comprehensive medication review performed, medication list updated in electronic medical record . Inter-disciplinary care team collaboration (see longitudinal plan of care) . Reviewed goal A1c <8%. Patient notes that she plans to continue to engage in regular exercise to continue to work on weight loss. Has cut back on carbohydrate portion sizes . Declines any alternative osteoporosis therapy at this time, as she notes she is not concerned about falls/fractures. Encouraged to continue to contemplate and  discuss risk vs benefit w/ PCP.   Patient Self Care Activities:  . Patient will check blood glucose QID using CGM , document, and provide at future appointments . Patient will take medications as prescribed  Please see past updates related to this goal by clicking on the "Past Updates" button in the selected goal         Patient verbalizes understanding of instructions provided today.   Plan:  - Patient has my contact information for future questions or concerns. I would be happy to re-engage with patient in the future if needed.   [name] Darnelle Maffucci, PharmD, [place], CPP Clinical Pharmacist [place] [phone]

## 2020-01-21 ENCOUNTER — Other Ambulatory Visit: Payer: Self-pay | Admitting: Internal Medicine

## 2020-01-24 ENCOUNTER — Encounter: Payer: Self-pay | Admitting: Podiatry

## 2020-01-24 ENCOUNTER — Other Ambulatory Visit: Payer: Self-pay

## 2020-01-24 ENCOUNTER — Ambulatory Visit: Payer: Medicare Other | Admitting: Podiatry

## 2020-01-24 DIAGNOSIS — E119 Type 2 diabetes mellitus without complications: Secondary | ICD-10-CM | POA: Diagnosis not present

## 2020-01-24 DIAGNOSIS — M79676 Pain in unspecified toe(s): Secondary | ICD-10-CM | POA: Diagnosis not present

## 2020-01-24 DIAGNOSIS — B351 Tinea unguium: Secondary | ICD-10-CM

## 2020-01-24 NOTE — Progress Notes (Signed)
This patient returns to my office for at risk foot care.  This patient requires this care by a professional since this patient will be at risk due to having diabetes.   This patient is unable to cut nails herself since the patient cannot reach her nails.These nails are painful walking and wearing shoes.  This patient presents for at risk foot care today.  General Appearance  Alert, conversant and in no acute stress.  Vascular  Dorsalis pedis and posterior tibial  pulses are palpable  bilaterally.  Capillary return is within normal limits  bilaterally. Temperature is within normal limits  bilaterally.  Neurologic  Senn-Weinstein monofilament wire test within normal limits  bilaterally. Muscle power within normal limits bilaterally.  Nails Thick disfigured discolored nails with subungual debris  from hallux to fifth toes bilaterally.  Absence of right hallux nail. No evidence of bacterial infection or drainage bilaterally.  Orthopedic  No limitations of motion  feet .  No crepitus or effusions noted.  No bony pathology or digital deformities noted.  Skin  normotropic skin with no porokeratosis noted bilaterally.  No signs of infections or ulcers noted.     Onychomycosis  Pain in right toes  Pain in left toes  Consent was obtained for treatment procedures.   Mechanical debridement of nails 1-5  bilaterally performed with a nail nipper with the exception right hallux toenail.Ceasar Mons with dremel without incident.    Return office visit  10 weeks                    Told patient to return for periodic foot care and evaluation due to potential at risk complications.   Helane Gunther DPM

## 2020-03-14 ENCOUNTER — Encounter: Payer: Self-pay | Admitting: Family Medicine

## 2020-03-14 ENCOUNTER — Ambulatory Visit (INDEPENDENT_AMBULATORY_CARE_PROVIDER_SITE_OTHER): Payer: Medicare Other | Admitting: Family Medicine

## 2020-03-14 ENCOUNTER — Other Ambulatory Visit: Payer: Self-pay

## 2020-03-14 DIAGNOSIS — R200 Anesthesia of skin: Secondary | ICD-10-CM | POA: Diagnosis not present

## 2020-03-14 DIAGNOSIS — I10 Essential (primary) hypertension: Secondary | ICD-10-CM | POA: Diagnosis not present

## 2020-03-14 DIAGNOSIS — E119 Type 2 diabetes mellitus without complications: Secondary | ICD-10-CM | POA: Diagnosis not present

## 2020-03-14 DIAGNOSIS — R2689 Other abnormalities of gait and mobility: Secondary | ICD-10-CM

## 2020-03-14 DIAGNOSIS — M810 Age-related osteoporosis without current pathological fracture: Secondary | ICD-10-CM | POA: Diagnosis not present

## 2020-03-14 LAB — COMPREHENSIVE METABOLIC PANEL
ALT: 11 U/L (ref 0–35)
AST: 12 U/L (ref 0–37)
Albumin: 4.3 g/dL (ref 3.5–5.2)
Alkaline Phosphatase: 63 U/L (ref 39–117)
BUN: 10 mg/dL (ref 6–23)
CO2: 29 mEq/L (ref 19–32)
Calcium: 9.5 mg/dL (ref 8.4–10.5)
Chloride: 101 mEq/L (ref 96–112)
Creatinine, Ser: 0.68 mg/dL (ref 0.40–1.20)
GFR: 81.19 mL/min (ref >60.00)
Glucose, Bld: 246 mg/dL — ABNORMAL HIGH (ref 70–99)
Potassium: 3.8 mEq/L (ref 3.5–5.1)
Sodium: 137 mEq/L (ref 135–145)
Total Bilirubin: 0.5 mg/dL (ref 0.2–1.2)
Total Protein: 6.3 g/dL (ref 6.0–8.3)

## 2020-03-14 LAB — LIPID PANEL
Cholesterol: 173 mg/dL (ref 0–200)
HDL: 36.2 mg/dL — ABNORMAL LOW (ref >39.00)
LDL Cholesterol: 114 mg/dL — ABNORMAL HIGH (ref 0–99)
NonHDL: 137.2
Total CHOL/HDL Ratio: 5
Triglycerides: 117 mg/dL (ref 0.0–149.0)
VLDL: 23.4 mg/dL (ref 0.0–40.0)

## 2020-03-14 LAB — HEMOGLOBIN A1C: Hgb A1c MFr Bld: 8.3 % — ABNORMAL HIGH (ref 4.6–6.5)

## 2020-03-14 NOTE — Patient Instructions (Signed)
Nice to see you. Please walk with your cane or walker all the time. We will get labs today and contact you with the results. Please change positions with your arms and hands frequently.

## 2020-03-14 NOTE — Assessment & Plan Note (Signed)
Continue diet and exercise.  Check A1c today.  Encouraged her to see ophthalmology as scheduled in November.

## 2020-03-14 NOTE — Assessment & Plan Note (Signed)
Well-controlled at home.  She will continue lisinopril 5 mg once daily.  Check labs as outlined below.

## 2020-03-14 NOTE — Assessment & Plan Note (Addendum)
Positional based on her description.  Benign exam.  Discussed change of positions frequently.

## 2020-03-14 NOTE — Assessment & Plan Note (Signed)
Chronic issue.  Benign exam.  Encouraged her to use her Rollator and cane all the time.

## 2020-03-14 NOTE — Progress Notes (Signed)
Tommi Rumps, MD Phone: [phone]  Laurie Patel is a 86 y.o. female who presents today for f/u.  HYPERTENSION  Disease Monitoring  Home BP Monitoring 130/50 Chest pain- no    Dyspnea- no Medications  Compliance-  Taking lisinopril.  Edema- no  Diabetes: Not checking glucose.  No polyuria or polydipsia.  She is work on diet and exercise changes.  She is walking a lot in the house.  Does some weight lifting.  Also gardening.  Eating cereal with blueberries for breakfast.  Eggs and toast for lunch.  Chicken and vegetables for dinner.  Bilateral hand numbness: Patient notes this occurs intermittently.  It is positional.  If she is sitting and has her arms on an armrest her hands are going numb and she will change positions and it will resolve.  Her hands also fall asleep in certain positions while she is sleeping.  If she moves positions the numbness goes away.  Balance difficulty: Patient notes some days her balance is good and some days it is not.  She notes when she has to change levels that is when she feels off balance.  She usually walks with a Rollator though occasionally uses a cane.  No recent falls.  No dizziness.  Social History   Tobacco Use  Smoking Status Never Smoker  Smokeless Tobacco Never Used     ROS see history of present illness  Objective  Physical Exam Vitals:   03/14/20 0905 03/14/20 0927  BP: (!) 160/80 (!) 150/70  Pulse: (!) 57   Temp: 98.2 F (36.8 C)   SpO2: 93%     BP Readings from Last 3 Encounters:  03/14/20 (!) 150/70  11/09/19 (!) 160/60  06/21/19 (!) 130/56   Wt Readings from Last 3 Encounters:  03/14/20 152 lb (68.9 kg)  11/09/19 155 lb (70.3 kg)  07/08/19 156 lb (70.8 kg)    Physical Exam Constitutional:      General: She is not in acute distress.    Appearance: She is not diaphoretic.  Cardiovascular:     Rate and Rhythm: Normal rate and regular rhythm.     Heart sounds: Normal heart sounds.  Pulmonary:     Effort:  Pulmonary effort is normal.     Breath sounds: Normal breath sounds.  Musculoskeletal:     Comments: Negative Tinel's at bilateral elbows  Skin:    General: Skin is warm and dry.  Neurological:     Mental Status: She is alert.     Comments: 5/5 strength in bilateral biceps, triceps, grip, quads, hamstrings, plantar and dorsiflexion, sensation to light touch intact in bilateral UE and LE, slow deliberate gait, no shuffling      Assessment/Plan: Please see individual problem list.  HTN (hypertension) Well-controlled at home.  She will continue lisinopril 5 mg once daily.  Check labs as outlined below.  Diabetes ([place]) Continue diet and exercise.  Check A1c today.  Encouraged her to see ophthalmology as scheduled in November.  OP (osteoporosis) Patient declines treatment for this.  The Prolia was too expensive and she feels as though she does not need treatment given lack of falls.  I did discuss that her balance difficulty makes her high risk for falls and fracture and that the medication may help prevent a fracture if that occurred.  Bilateral hand numbness Positional based on her description.  Benign exam.  Discussed change of positions frequently.  Balance problem Chronic issue.  Benign exam.  Encouraged her to use her Rollator  and cane all the time.   Orders Placed This Encounter  Procedures  . Comp Met (CMET)  . Lipid panel  . HgB A1c    No orders of the defined types were placed in this encounter.   Laurie Patel was seen today for follow-up.  Diagnoses and all orders for this visit:  Essential hypertension -     Comp Met (CMET) -     Lipid panel  Type 2 diabetes mellitus without complication, without long-term current use of insulin (HCC) -     HgB A1c  Age-related osteoporosis without current pathological fracture  Bilateral hand numbness  Balance problem     This visit occurred during the SARS-CoV-2 public health emergency.  Safety protocols were in place,  including screening questions prior to the visit, additional usage of staff PPE, and extensive cleaning of exam room while observing appropriate contact time as indicated for disinfecting solutions.    Tommi Rumps, MD [place]

## 2020-03-14 NOTE — Assessment & Plan Note (Signed)
Patient declines treatment for this.  The Prolia was too expensive and she feels as though she does not need treatment given lack of falls.  I did discuss that her balance difficulty makes her high risk for falls and fracture and that the medication may help prevent a fracture if that occurred.

## 2020-03-16 ENCOUNTER — Telehealth: Payer: Self-pay | Admitting: Family Medicine

## 2020-03-16 DIAGNOSIS — E119 Type 2 diabetes mellitus without complications: Secondary | ICD-10-CM

## 2020-03-16 NOTE — Telephone Encounter (Signed)
Received message from Coralee North that patient would like to go back on Jardiance, and would like to talk to me about accessibility.   As CCM services previously ended, I need a new referral. Sending to PCP.   Amber, when you get this referral, OK to schedule for 45 minute f/u in sooner time slot.   [name]

## 2020-03-16 NOTE — Telephone Encounter (Signed)
Patient called in about insulin medication stated Dr.lSonnenberg wanted her to go back on it.

## 2020-03-18 NOTE — Telephone Encounter (Signed)
Referral placed.

## 2020-03-20 ENCOUNTER — Telehealth: Payer: Self-pay

## 2020-03-20 NOTE — Chronic Care Management (AMB) (Signed)
  Chronic Care Management   Note  03/20/2020 Name: PHILAMENA KRAMAR MRN: [id number] DOB: May 30, 1928  Guida Asman Wyles is a 86 y.o. year old female who is a primary care patient of Birdie Sons, Yehuda Mao, MD. Noble Bodie Clauson is currently enrolled in care management services. An additional referral for Pharm D  was placed.   Follow up plan: Telephone appointment with care management team member scheduled for: 03/31/2020  Penne Lash, RMA Care Guide, Embedded Care Coordination [facility]  [place], [place] [address] Direct Dial: [phone] [email or web address] Website: [email or web address]

## 2020-03-21 NOTE — Telephone Encounter (Signed)
Pt has been scheduled 03/28/2020

## 2020-03-22 NOTE — Progress Notes (Signed)
Patient called me, LVM asking to reschedule upcoming CCM PharmD appt. Routing to Care Guide for assistance in rescheduling

## 2020-03-28 ENCOUNTER — Ambulatory Visit (INDEPENDENT_AMBULATORY_CARE_PROVIDER_SITE_OTHER): Payer: Medicare Other | Admitting: Pharmacist

## 2020-03-28 ENCOUNTER — Telehealth: Payer: Medicare Other

## 2020-03-28 DIAGNOSIS — M810 Age-related osteoporosis without current pathological fracture: Secondary | ICD-10-CM

## 2020-03-28 DIAGNOSIS — I10 Essential (primary) hypertension: Secondary | ICD-10-CM

## 2020-03-28 DIAGNOSIS — E119 Type 2 diabetes mellitus without complications: Secondary | ICD-10-CM

## 2020-03-28 MED ORDER — EMPAGLIFLOZIN 10 MG PO TABS: 10.0000 mg | ORAL_TABLET | Freq: Every day | ORAL | 2 refills | Status: DC

## 2020-03-28 NOTE — Patient Instructions (Addendum)
Ms. Velador,   It was great talking with you today!  We are restarting Jardiance 10 mg daily. I called [facility]; they noted your supply should ship in 5-7 business days.   Once you finish this supply of Libre sensors, don't worry about refilling them if you are concerned about the cost. With you being on just Jardiance for your blood sugars, I do not think we need to add the worry about you checking your sugar at home. Let's just plan to follow your next A1c.  As always, call me with any questions!  [name] Darnelle Maffucci, PharmD [phone]  Visit Information  Goals Addressed              This Visit's Progress     Patient Stated   .  PharmD "I can't afford this medication" (pt-stated)        CARE PLAN ENTRY (see longtitudinal plan of care for additional care plan information)  Current Barriers:  . Social, community, and financial barriers: o None noted at this time today o Reports that she received her Rewey booster today . Diabetes: uncontrolled; complicated by chronic medical conditions including HTN, HLD, osteoporosis; most recent A1c 8.3% . Most recent eGFR: 81 mL/min . Current antihyperglycemic regimen: none o Hx Jardiance, reports it caused constipation o Hx metformin, unknown reason for discontinuation . Current blood glucose readings: using CGM, but scanning sporadically. Paying out of pocket for CGM. Notes that she would like for her insurance to cover this. Notes that she spoke with someone the other day who has [facility] insurance and that they were able to [facility] covered by the insurance because their provider wrote "medically necessary" . Current meal patterns:  o Reports that she has changed from cereal for breakfast to eggs. Keeps protein snacks (string cheese) on hand. Realized that eating a protein snack before bed helps keep her sugar more stable . Cardiovascular risk reduction: o Current hypertensive regimen: lisinopril 5 mg daily; BP appropriately  managed given age o Current hyperlipidemia regimen: none; not necessary given age o Current antiplatelet regimen: ASA 81 mg daily  . Osteoporosis: T score -3.8 at wrist, -3.2 at femoral neck. Received one dose of Prolia, but was expensive. She declines to continue therapy.  Pharmacist Clinical Goal(s):  Marland Kitchen Over the next 90 days, patient will work with PharmD and primary care provider to address optimized medication management  Interventions: . Comprehensive medication review performed, medication list updated in electronic medical record . Inter-disciplinary care team collaboration (see longitudinal plan of care) . Reviewed goal A1c <8%. Discussed retrial of Jardiance vs Rybelsus. Patient notes that she spoke with her insurance and Rybelsus would be $800. Reviewed that we could pursue patient assistance. Patient elects to re-try Jardiance at this time. Start Jardiance 10 mg daily. Prepared sample, called [facility] to place refill request. Recommended adequate hydration and increase in fiber intake to prevent constipation . Reviewed that patient does not meet Medicare criteria for CGM due to not being on insulin. Reviewed that given current regimen and lack of hypoglycemic-prone agents, I believe it appropriate to defer any home glucose checks (fingerstick or CGM) at this time, and recheck A1c when due. CGM is not medically necessary at this time. She has 2 sensors remaining; she will use these and not refill.  Marland Kitchen Extensive dietary discussion. Praised for incorporating protein snacks to maintain glucose control  Patient Self Care Activities:  . Patient will take medications as prescribed  Please see past updates related  to this goal by clicking on the "Past Updates" button in the selected goal         Ms. Hawks was given information about Chronic Care Management services today including:  1. CCM service includes personalized support from designated clinical staff supervised by  her physician, including individualized plan of care and coordination with other care providers 2. 24/7 contact phone numbers for assistance for urgent and routine care needs. 3. Service will only be billed when office clinical staff spend 20 minutes or more in a month to coordinate care. 4. Only one practitioner may furnish and bill the service in a calendar month. 5. The patient may stop CCM services at any time (effective at the end of the month) by phone call to the office staff. 6. The patient will be responsible for cost sharing (co-pay) of up to 20% of the service fee (after annual deductible is met).  Patient agreed to services and verbal consent obtained.   The patient verbalized understanding of instructions provided today and agreed to receive a mailed copy of patient instruction and/or educational materials.   Plan: - Scheduled f/u call in ~ 8 weeks  [name] Darnelle Maffucci, PharmD, [place], [place] Pharmacist [place] [phone]

## 2020-03-28 NOTE — Chronic Care Management (AMB) (Signed)
**Note Laurie-Identified via Obfuscation** Chronic Care Management   Note  03/28/2020 Name: Laurie Patel MRN: [id number] DOB: 1928-06-08   Subjective:  Laurie Patel is a 86 y.o. year old female who is a primary care patient of Laurie Patel, Laurie Adam, MD. The CCM team was consulted for assistance with chronic disease management and care coordination needs.     Contacted patient for initial medication access and medication management support  Laurie Patel was given information about Chronic Care Management services today including:  1. CCM service includes personalized support from designated clinical staff supervised by her physician, including individualized plan of care and coordination with other care providers 2. 24/7 contact phone numbers for assistance for urgent and routine care needs. 3. Service will only be billed when office clinical staff spend 20 minutes or more in a month to coordinate care. 4. Only one practitioner may furnish and bill the service in a calendar month. 5. The patient may stop CCM services at any time (effective at the end of the month) by phone call to the office staff. 6. The patient will be responsible for cost sharing (co-pay) of up to 20% of the service fee (after annual deductible is met).  Patient agreed to services and verbal consent obtained.   Review of patient status, including review of consultants reports, laboratory and other test data, was performed as part of comprehensive evaluation and provision of chronic care management services.   SDOH (Social Determinants of Health) assessments and interventions performed:    Objective:  Lab Results  Component Value Date   CREATININE 0.68 03/14/2020   CREATININE 0.73 07/13/2019   CREATININE 0.70 01/18/2019    Lab Results  Component Value Date   HGBA1C 8.3 (H) 03/14/2020       Component Value Date/Time   CHOL 173 03/14/2020 0941   TRIG 117.0 03/14/2020 0941   HDL 36.20 (L) 03/14/2020 0941   CHOLHDL 5 03/14/2020 0941   VLDL 23.4  03/14/2020 0941   LDLCALC 114 (H) 03/14/2020 0941   LDLDIRECT 147.0 03/21/2016 0926    Clinical ASCVD: No  The ASCVD Risk score (Goff DC Jr., et al., 2013) failed to calculate for the following reasons:   The 2013 ASCVD risk score is only valid for ages 96 to 46    BP Readings from Last 3 Encounters:  03/14/20 (!) 150/70  11/09/19 (!) 160/60  06/21/19 (!) 130/56    Allergies  Allergen Reactions  . Laurie Patel [Amoxicillin-Pot Clavulanate] Nausea And Vomiting    Medications Reviewed Today    Reviewed by Laurie Patel, RPH-CPP (Pharmacist) on 03/28/20 at 1502  Med List Status: <None>  Medication Order Taking? Sig Documenting Provider Last Dose Status Informant  aspirin 81 MG EC tablet [id number] Yes Take 81 mg by mouth daily. Swallow whole. [provider] Taking Active Self  blood glucose meter kit and supplies KIT [id number] Yes Dispense based on patient and insurance preference. Use once daily. E11.9. Laurie Haven, MD Taking Active   Continuous Blood Gluc Receiver (Laurie LIBRE 14 DAY READER) [place] [id number] Yes USE WITH SENSORS TO MONITOR BLOOD GLUCOSE Laurie Haven, MD Taking Active   Continuous Blood Gluc Sensor (Laurie Patel) [place] [id number] Yes Use one sensory every 14 days to check blood glucose readings. Laurie Haven, MD Taking Active   Lancets (ONETOUCH DELICA PLUS [id number]) Laurie Patel [id number] Yes USE ONE DAILY Laurie Haven, MD Taking Active   lisinopril (ZESTRIL) 5 MG tablet [id number] Yes TAKE ONE TABLET BY MOUTH  DAILY *MUST KEEP APPOINTMENT FOR REFILLS* Laurie Haven, MD Taking Active   Multiple Vitamins-Calcium (ONE-A-DAY WOMENS PO) [id number] Yes Take 1 tablet by mouth daily. [provider] Taking Active Self  Laurie Patel test strip [id number] Yes  [provider] Taking Active            Assessment:   Goals Addressed              This Visit's Progress     Patient Stated   .  PharmD  "I can't afford this medication" (pt-stated)        CARE PLAN ENTRY (see longtitudinal plan of care for additional care plan information)  Current Barriers:  . Social, community, and financial barriers: o None noted at this time today o Reports that she received her [place] booster today . Diabetes: uncontrolled; complicated by chronic medical conditions including HTN, HLD, osteoporosis; most recent A1c 8.3% . Most recent eGFR: 81 mL/min . Current antihyperglycemic regimen: none o Hx Jardiance, reports it caused constipation o Hx metformin, unknown reason for discontinuation . Current blood glucose readings: using CGM, but scanning sporadically. Paying out of pocket for CGM. Notes that she would like for her insurance to cover this. Notes that she spoke with someone the other day who has [facility] insurance and that they were able to [facility] covered by the insurance because their provider wrote "medically necessary" . Current meal patterns:  o Reports that she has changed from cereal for breakfast to eggs. Keeps protein snacks (string cheese) on hand. Realized that eating a protein snack before bed helps keep her sugar more stable . Cardiovascular risk reduction: o Current hypertensive regimen: lisinopril 5 mg daily; BP appropriately managed given age o Current hyperlipidemia regimen: none; not necessary given age o Current antiplatelet regimen: ASA 81 mg daily  . Osteoporosis: T score -3.8 at wrist, -3.2 at femoral neck. Received one dose of Prolia, but was expensive. She declines to continue therapy.  Pharmacist Clinical Goal(s):  Marland Kitchen Over the next 90 days, patient will work with PharmD and primary care provider to address optimized medication management  Interventions: . Comprehensive medication review performed, medication list updated in electronic medical record . Inter-disciplinary care team collaboration (see longitudinal plan of care) . Reviewed goal A1c <8%. Discussed retrial of  Jardiance vs Rybelsus. Patient notes that she spoke with her insurance and Rybelsus would be $800. Reviewed that we could pursue patient assistance. Patient elects to re-try Jardiance at this time. Start Jardiance 10 mg daily. Prepared sample, called [facility] to place refill request. Recommended adequate hydration and increase in fiber intake to prevent constipation . Reviewed that patient does not meet Medicare criteria for CGM due to not being on insulin. Reviewed that given current regimen and lack of hypoglycemic-prone agents, I believe it appropriate to defer any home glucose checks (fingerstick or CGM) at this time, and recheck A1c when due. CGM is not medically necessary at this time. She has 2 sensors remaining; she will use these and not refill.  Marland Kitchen Extensive dietary discussion. Praised for incorporating protein snacks to maintain glucose control  Patient Self Care Activities:  . Patient will take medications as prescribed  Please see past updates related to this goal by clicking on the "Past Updates" button in the selected goal         Plan: - Scheduled f/u call in ~ 8 weeks  [name] Darnelle Maffucci, PharmD, [place], [place] Pharmacist [place] [phone]

## 2020-03-29 ENCOUNTER — Telehealth: Payer: Self-pay | Admitting: Pharmacist

## 2020-03-29 NOTE — Progress Notes (Signed)
Medication Samples have been labeled, logged, and placed at the front desk for the patient.  Drug name: Jardiance       Strength: 10 mg       Qty: 14 tab  LOT: [id number]  Exp.Date: 01/2022

## 2020-03-31 ENCOUNTER — Telehealth: Payer: Medicare Other

## 2020-04-05 ENCOUNTER — Ambulatory Visit: Payer: Medicare Other | Admitting: Pharmacist

## 2020-04-05 DIAGNOSIS — E119 Type 2 diabetes mellitus without complications: Secondary | ICD-10-CM

## 2020-04-05 DIAGNOSIS — I10 Essential (primary) hypertension: Secondary | ICD-10-CM

## 2020-04-05 NOTE — Chronic Care Management (AMB) (Signed)
Chronic Care Management   Follow Up Note   04/05/2020 Name: REGGIE WELGE MRN: [id number] DOB: 01-16-1928  Referred by: Leone Haven, MD Reason for referral : Chronic Care Management (Medication Management)   ONESTI BONFIGLIO is a 86 y.o. year old female who is a primary care patient of [name], [name], MD. The CCM team was consulted for assistance with chronic disease management and care coordination needs.    Received call from patient regarding medication management  Review of patient status, including review of consultants reports, relevant laboratory and other test results, and collaboration with appropriate care team members and the patient's provider was performed as part of comprehensive patient evaluation and provision of chronic care management services.    SDOH (Social Determinants of Health) assessments performed: No See Care Plan activities for detailed interventions related to [facility])     Outpatient Encounter Medications as of 04/05/2020  Medication Sig  . aspirin 81 MG EC tablet Take 81 mg by mouth daily. Swallow whole.  . blood glucose meter kit and supplies KIT Dispense based on patient and insurance preference. Use once daily. E11.9.  . Continuous Blood Gluc Receiver (FREESTYLE LIBRE 14 DAY READER) DEVI USE WITH SENSORS TO MONITOR BLOOD GLUCOSE  . Continuous Blood Gluc Sensor (FREESTYLE LIBRE 14 DAY SENSOR) MISC Use one sensory every 14 days to check blood glucose readings.  . empagliflozin (JARDIANCE) 10 MG TABS tablet Take 1 tablet (10 mg total) by mouth daily before breakfast.  . Lancets (ONETOUCH DELICA PLUS [id number]) MISC USE ONE DAILY  . lisinopril (ZESTRIL) 5 MG tablet TAKE ONE TABLET BY MOUTH DAILY *MUST KEEP APPOINTMENT FOR REFILLS*  . Multiple Vitamins-Calcium (ONE-A-DAY WOMENS PO) Take 1 tablet by mouth daily.  Glory Rosebush VERIO test strip    No facility-administered encounter medications on file as of 04/05/2020.     Objective:   Goals  Addressed              This Visit's Progress     Patient Stated   .  PharmD "I can't afford this medication" (pt-stated)        CARE PLAN ENTRY (see longtitudinal plan of care for additional care plan information)  Current Barriers:  . Social, community, and financial barriers: o Calls today to report constipation with Jardiance. Has taken for 3 days. Is upset about the constipation, and also concerned that she does not "feel" any benefit from the medication . Diabetes: uncontrolled; complicated by chronic medical conditions including HTN, HLD, osteoporosis; most recent A1c 8.3% . Most recent eGFR: 81 mL/min . Current antihyperglycemic regimen: Jardiance 10 mg daily o Hx metformin, unknown reason for discontinuation . Current blood glucose readings: using CGM, but scanning sporadically. Plans to discontinue when she finishes current supply . Cardiovascular risk reduction: o Current hypertensive regimen: lisinopril 5 mg daily; BP appropriately managed given age o Current hyperlipidemia regimen: none; not necessary given age o Current antiplatelet regimen: ASA 81 mg daily  . Osteoporosis: T score -3.8 at wrist, -3.2 at femoral neck. Declined therapy.   Pharmacist Clinical Goal(s):  Marland Kitchen Over the next 90 days, patient will work with PharmD and primary care provider to address optimized medication management  Interventions: . Reviewed that she likely would not feel any different, and will take ~1 week of therapy to see glucose reduction, primarily post-prandial. Discussed trying an alternative option. Could consider DPP4, as goal <8% is likely appropriate for patient and DPP4 may deliver that degree of glucose reduction. Could consider  Rybelsus, but concerned that this is more likely to cause GI side effects. Would like to avoid sulfonylurea d/t risk of hypoglycemia. Patient not sure if she wants to try a new agent. Will discuss w/ PCP and call patient in ~1 week to follow up.  Patient Self  Care Activities:  . Patient will take medications as prescribed  Please see past updates related to this goal by clicking on the "Past Updates" button in the selected goal          Plan:  - Scheduled f/u call in ~ 1 week  [name] Darnelle Maffucci, PharmD, [place], [place] Pharmacist [place] [phone]

## 2020-04-05 NOTE — Patient Instructions (Signed)
Visit Information  Goals Addressed              This Visit's Progress     Patient Stated   .  PharmD "I can't afford this medication" (pt-stated)        CARE PLAN ENTRY (see longtitudinal plan of care for additional care plan information)  Current Barriers:  . Social, community, and financial barriers: o Calls today to report constipation with Jardiance. Has taken for 3 days. Is upset about the constipation, and also concerned that she does not "feel" any benefit from the medication . Diabetes: uncontrolled; complicated by chronic medical conditions including HTN, HLD, osteoporosis; most recent A1c 8.3% . Most recent eGFR: 81 mL/min . Current antihyperglycemic regimen: Jardiance 10 mg daily o Hx metformin, unknown reason for discontinuation . Current blood glucose readings: using CGM, but scanning sporadically. Plans to discontinue when she finishes current supply . Cardiovascular risk reduction: o Current hypertensive regimen: lisinopril 5 mg daily; BP appropriately managed given age o Current hyperlipidemia regimen: none; not necessary given age o Current antiplatelet regimen: ASA 81 mg daily  . Osteoporosis: T score -3.8 at wrist, -3.2 at femoral neck. Declined therapy.   Pharmacist Clinical Goal(s):  Marland Kitchen Over the next 90 days, patient will work with PharmD and primary care provider to address optimized medication management  Interventions: . Reviewed that she likely would not feel any different, and will take ~1 week of therapy to see glucose reduction, primarily post-prandial. Discussed trying an alternative option. Could consider DPP4, as goal <8% is likely appropriate for patient and DPP4 may deliver that degree of glucose reduction. Could consider Rybelsus, but concerned that this is more likely to cause GI side effects. Would like to avoid sulfonylurea d/t risk of hypoglycemia. Patient not sure if she wants to try a new agent. Will discuss w/ PCP and call patient in ~1 week to  follow up.  Patient Self Care Activities:  . Patient will take medications as prescribed  Please see past updates related to this goal by clicking on the "Past Updates" button in the selected goal         The patient verbalized understanding of instructions provided today and declined a print copy of patient instruction materials.   Plan:  - Scheduled f/u call in ~ 1 week  [name] Darnelle Maffucci, PharmD, [place], [place] Pharmacist [place] [phone]

## 2020-04-07 ENCOUNTER — Ambulatory Visit: Payer: Medicare Other | Admitting: Pharmacist

## 2020-04-07 DIAGNOSIS — E119 Type 2 diabetes mellitus without complications: Secondary | ICD-10-CM | POA: Diagnosis not present

## 2020-04-07 DIAGNOSIS — M810 Age-related osteoporosis without current pathological fracture: Secondary | ICD-10-CM | POA: Diagnosis not present

## 2020-04-07 DIAGNOSIS — E785 Hyperlipidemia, unspecified: Secondary | ICD-10-CM

## 2020-04-07 DIAGNOSIS — I10 Essential (primary) hypertension: Secondary | ICD-10-CM

## 2020-04-07 NOTE — Patient Instructions (Signed)
Visit Information  Goals Addressed              This Visit's Progress     Patient Stated     PharmD "I can't afford this medication" (pt-stated)        CARE PLAN ENTRY (see longtitudinal plan of care for additional care plan information)  Current Barriers:   Social, community, and financial barriers: o Calls today to report that she has discontinued Jardiance. Feels like she has an "intestinal infection", reports that she has had constipation for a few days, and yesterday had pain in her back up to her shoulders. Reports improvement today.   Diabetes: uncontrolled; complicated by chronic medical conditions including HTN, HLD, osteoporosis; most recent A1c 8.3%  Most recent eGFR: 81 mL/min  Current antihyperglycemic regimen: Jardiance 10 mg daily- discontinuedl  o Hx metformin, unknown reason for discontinuation  Current blood glucose readings: not checking  Cardiovascular risk reduction: o Current hypertensive regimen: lisinopril 5 mg daily; BP appropriately managed given age o Current hyperlipidemia regimen: none; not necessary given age o Current antiplatelet regimen: ASA 81 mg daily   Osteoporosis: T score -3.8 at wrist, -3.2 at femoral neck. Declined therapy.   Pharmacist Clinical Goal(s):   Over the next 90 days, patient will work with PharmD and primary care provider to address optimized medication management  Interventions:  Agree w/ Jardiance discontinuation. Encouraged patient to focus on remaining well hydrated. Encouraged evaluation at Urgent Care if she feels she has an infection. She notes she will consider it and go this weekend if needed.   Consider Tradjenta moving forward. Will outreach in ~3-4 weeks to follow up.  Patient Self Care Activities:   Patient will take medications as prescribed  Please see past updates related to this goal by clicking on the "Past Updates" button in the selected goal         The patient verbalized understanding of  instructions provided today and declined a print copy of patient instruction materials.    Plan:  - Scheduled f/u call in ~ 3-4 weeks  [name] Darnelle Maffucci, PharmD, [place], [place] Pharmacist [place] [phone]

## 2020-04-07 NOTE — Chronic Care Management (AMB) (Signed)
Chronic Care Management   Follow Up Note   04/07/2020 Name: Laurie Patel MRN: [id number] DOB: [birth year]  Referred by: Leone Haven, MD Reason for referral : Chronic Care Management (Medication Management)   Laurie Patel is a 86 y.o. year old female who is a primary care patient of [name], [name], MD. The CCM team was consulted for assistance with chronic disease management and care coordination needs.    Received call from patient for medication management questions.   Review of patient status, including review of consultants reports, relevant laboratory and other test results, and collaboration with appropriate care team members and the patient's provider was performed as part of comprehensive patient evaluation and provision of chronic care management services.    SDOH (Social Determinants of Health) assessments performed: No See Care Plan activities for detailed interventions related to [facility])     Outpatient Encounter Medications as of 04/07/2020  Medication Sig  . aspirin 81 MG EC tablet Take 81 mg by mouth daily. Swallow whole.  . blood glucose meter kit and supplies KIT Dispense based on patient and insurance preference. Use once daily. E11.9.  . Continuous Blood Gluc Receiver (FREESTYLE LIBRE 14 DAY READER) DEVI USE WITH SENSORS TO MONITOR BLOOD GLUCOSE  . Continuous Blood Gluc Sensor (FREESTYLE LIBRE 14 DAY SENSOR) MISC Use one sensory every 14 days to check blood glucose readings.  . empagliflozin (JARDIANCE) 10 MG TABS tablet Take 1 tablet (10 mg total) by mouth daily before breakfast.  . Lancets (ONETOUCH DELICA PLUS [id number]) MISC USE ONE DAILY  . lisinopril (ZESTRIL) 5 MG tablet TAKE ONE TABLET BY MOUTH DAILY *MUST KEEP APPOINTMENT FOR REFILLS*  . Multiple Vitamins-Calcium (ONE-A-DAY WOMENS PO) Take 1 tablet by mouth daily.  Glory Rosebush VERIO test strip    No facility-administered encounter medications on file as of 04/07/2020.     Objective:   Goals  Addressed              This Visit's Progress     Patient Stated   .  PharmD "I can't afford this medication" (pt-stated)        CARE PLAN ENTRY (see longtitudinal plan of care for additional care plan information)  Current Barriers:  . Social, community, and financial barriers: o Calls today to report that she has discontinued Jardiance. Feels like she has an "intestinal infection", reports that she has had constipation for a few days, and yesterday had pain in her back up to her shoulders. Reports improvement today.  . Diabetes: uncontrolled; complicated by chronic medical conditions including HTN, HLD, osteoporosis; most recent A1c 8.3% . Most recent eGFR: 81 mL/min . Current antihyperglycemic regimen: Jardiance 10 mg daily- discontinuedl  o Hx metformin, unknown reason for discontinuation . Current blood glucose readings: not checking . Cardiovascular risk reduction: o Current hypertensive regimen: lisinopril 5 mg daily; BP appropriately managed given age o Current hyperlipidemia regimen: none; not necessary given age o Current antiplatelet regimen: ASA 81 mg daily  . Osteoporosis: T score -3.8 at wrist, -3.2 at femoral neck. Declined therapy.   Pharmacist Clinical Goal(s):  Marland Kitchen Over the next 90 days, patient will work with PharmD and primary care provider to address optimized medication management  Interventions: . Agree w/ Jardiance discontinuation. Encouraged patient to focus on remaining well hydrated. Encouraged evaluation at Urgent Care if she feels she has an infection. She notes she will consider it and go this weekend if needed.  . Consider Tradjenta moving forward. Will outreach in ~  3-4 weeks to follow up.  Patient Self Care Activities:  . Patient will take medications as prescribed  Please see past updates related to this goal by clicking on the "Past Updates" button in the selected goal          Plan:  - Scheduled f/u call in ~ 3-4 weeks  [name] Darnelle Maffucci,  PharmD, [place], [place] Pharmacist [place] [phone]

## 2020-04-09 ENCOUNTER — Other Ambulatory Visit: Payer: Self-pay

## 2020-04-09 ENCOUNTER — Emergency Department
Admission: EM | Admit: 2020-04-09 | Discharge: 2020-04-09 | Disposition: A | Discharge status: Home / Self Care | Payer: Medicare Other | Attending: Student in an Organized Health Care Education/Training Program | Admitting: Student in an Organized Health Care Education/Training Program

## 2020-04-09 DIAGNOSIS — R9431 Abnormal electrocardiogram [ECG] [EKG]: Secondary | ICD-10-CM | POA: Diagnosis not present

## 2020-04-09 DIAGNOSIS — K5900 Constipation, unspecified: Secondary | ICD-10-CM | POA: Diagnosis not present

## 2020-04-09 DIAGNOSIS — Z5321 Procedure and treatment not carried out due to patient leaving prior to being seen by health care provider: Secondary | ICD-10-CM | POA: Diagnosis not present

## 2020-04-09 DIAGNOSIS — R140 Abdominal distension (gaseous): Secondary | ICD-10-CM | POA: Diagnosis not present

## 2020-04-09 DIAGNOSIS — M545 Low back pain, unspecified: Secondary | ICD-10-CM | POA: Insufficient documentation

## 2020-04-09 DIAGNOSIS — R030 Elevated blood-pressure reading, without diagnosis of hypertension: Secondary | ICD-10-CM | POA: Diagnosis not present

## 2020-04-09 DIAGNOSIS — R109 Unspecified abdominal pain: Secondary | ICD-10-CM | POA: Diagnosis not present

## 2020-04-09 LAB — COMPREHENSIVE METABOLIC PANEL
ALT: 15 U/L (ref 0–44)
AST: 17 U/L (ref 15–41)
Albumin: 4.7 g/dL (ref 3.5–5.0)
Alkaline Phosphatase: 67 U/L (ref 38–126)
Anion gap: 10 (ref 5–15)
BUN: 10 mg/dL (ref 8–23)
CO2: 29 mmol/L (ref 22–32)
Calcium: 10.2 mg/dL (ref 8.9–10.3)
Chloride: 99 mmol/L (ref 98–111)
Creatinine, Ser: 0.58 mg/dL (ref 0.44–1.00)
GFR, Estimated: >60 mL/min (ref >60)
Glucose, Bld: 144 mg/dL — ABNORMAL HIGH (ref 70–99)
Potassium: 4.4 mmol/L (ref 3.5–5.1)
Sodium: 138 mmol/L (ref 135–145)
Total Bilirubin: 0.9 mg/dL (ref 0.3–1.2)
Total Protein: 7.9 g/dL (ref 6.5–8.1)

## 2020-04-09 LAB — CBC
HCT: 48.8 % — ABNORMAL HIGH (ref 36.0–46.0)
Hemoglobin: 16.3 g/dL — ABNORMAL HIGH (ref 12.0–15.0)
MCH: 27.9 pg (ref 26.0–34.0)
MCHC: 33.4 g/dL (ref 30.0–36.0)
MCV: 83.6 fL (ref 80.0–100.0)
Platelets: 270 10*3/uL (ref 150–400)
RBC: 5.84 MIL/uL — ABNORMAL HIGH (ref 3.87–5.11)
RDW: 13.2 % (ref 11.5–15.5)
WBC: 11.8 10*3/uL — ABNORMAL HIGH (ref 4.0–10.5)
nRBC: 0 % (ref 0.0–0.2)

## 2020-04-09 LAB — LIPASE, BLOOD: Lipase: 26 U/L (ref 11–51)

## 2020-04-09 NOTE — ED Triage Notes (Signed)
Pt comes POV after having stomach bloating/pain and back pain after starting jardiance. AOx4.

## 2020-04-09 NOTE — ED Notes (Signed)
Patient let this RN know she was leaving at this time and not waiting any longer. Patient seen walking out the ER with her daughter

## 2020-04-10 ENCOUNTER — Telehealth: Payer: Medicare Other

## 2020-04-12 ENCOUNTER — Telehealth: Payer: Medicare Other

## 2020-04-13 ENCOUNTER — Ambulatory Visit: Payer: Medicare Other | Admitting: Pharmacist

## 2020-04-13 NOTE — Chronic Care Management (AMB) (Signed)
  Chronic Care Management   Note  04/13/2020 Name: Laurie Patel MRN: [id number] DOB: 07-02-1927   Patient called reporting continued constipation. Denies back pain today, but also reports that she is "cold". Discussed that as Urgent Care recommended she go to the ED on Saturday, Dr. Birdie Sons would be in agreement with that. Patient declines to go to the ED given the long wait she was quoted on Saturday.   Offered appointment w/ PCP next week. Patient accepted. Gave precautions to go to ED over the weekend if recurrent stomach pain or back pain.  [name] Feliz Beam, PharmD, [place], CPP Clinical Pharmacist [facility] [place] [employer] [phone]

## 2020-04-17 ENCOUNTER — Ambulatory Visit: Payer: Medicare Other | Admitting: Podiatry

## 2020-04-17 ENCOUNTER — Encounter: Payer: Self-pay | Admitting: Podiatry

## 2020-04-17 ENCOUNTER — Other Ambulatory Visit: Payer: Self-pay

## 2020-04-17 DIAGNOSIS — M79676 Pain in unspecified toe(s): Secondary | ICD-10-CM | POA: Diagnosis not present

## 2020-04-17 DIAGNOSIS — E119 Type 2 diabetes mellitus without complications: Secondary | ICD-10-CM | POA: Diagnosis not present

## 2020-04-17 DIAGNOSIS — B351 Tinea unguium: Secondary | ICD-10-CM

## 2020-04-17 NOTE — Progress Notes (Signed)
This patient returns to my office for at risk foot care.  This patient requires this care by a professional since this patient will be at risk due to having diabetes.   This patient is unable to cut nails herself since the patient cannot reach her nails.These nails are painful walking and wearing shoes.  This patient presents for at risk foot care today.  General Appearance  Alert, conversant and in no acute stress.  Vascular  Dorsalis pedis and posterior tibial  pulses are palpable  bilaterally.  Capillary return is within normal limits  bilaterally. Temperature is within normal limits  bilaterally.  Neurologic  Senn-Weinstein monofilament wire test within normal limits  bilaterally. Muscle power within normal limits bilaterally.  Nails Thick disfigured discolored nails with subungual debris  from hallux to fifth toes bilaterally.  Absence of right hallux nail. No evidence of bacterial infection or drainage bilaterally.  Orthopedic  No limitations of motion  feet .  No crepitus or effusions noted.  No bony pathology or digital deformities noted.  Skin  normotropic skin with no porokeratosis noted bilaterally.  No signs of infections or ulcers noted.     Onychomycosis  Pain in right toes  Pain in left toes  Consent was obtained for treatment procedures.   Mechanical debridement of nails 1-5  Left and 2-5 right  performed with a nail nipper with the exception right hallux toenail.Ceasar Mons with dremel without incident.    Return office visit  10 weeks                    Told patient to return for periodic foot care and evaluation due to potential at risk complications.   Helane Gunther DPM

## 2020-04-18 ENCOUNTER — Telehealth: Payer: Self-pay | Admitting: Family Medicine

## 2020-04-18 ENCOUNTER — Ambulatory Visit (INDEPENDENT_AMBULATORY_CARE_PROVIDER_SITE_OTHER): Payer: Medicare Other | Admitting: Family Medicine

## 2020-04-18 ENCOUNTER — Other Ambulatory Visit: Payer: Self-pay

## 2020-04-18 ENCOUNTER — Encounter: Payer: Self-pay | Admitting: Family Medicine

## 2020-04-18 VITALS — BP 140/60 | HR 104 | Temp 98.8°F | Ht 65.0 in | Wt 145.0 lb

## 2020-04-18 DIAGNOSIS — D582 Other hemoglobinopathies: Secondary | ICD-10-CM | POA: Diagnosis not present

## 2020-04-18 DIAGNOSIS — I10 Essential (primary) hypertension: Secondary | ICD-10-CM | POA: Diagnosis not present

## 2020-04-18 DIAGNOSIS — K5900 Constipation, unspecified: Secondary | ICD-10-CM | POA: Diagnosis not present

## 2020-04-18 DIAGNOSIS — E119 Type 2 diabetes mellitus without complications: Secondary | ICD-10-CM | POA: Diagnosis not present

## 2020-04-18 MED ORDER — LINAGLIPTIN 5 MG PO TABS: 5.0000 mg | ORAL_TABLET | Freq: Every day | ORAL | 3 refills | Status: DC

## 2020-04-18 MED ORDER — LISINOPRIL 5 MG PO TABS: ORAL_TABLET | ORAL | 2 refills | Status: DC

## 2020-04-18 MED ORDER — POLYETHYLENE GLYCOL 3350 17 GM/SCOOP PO POWD: 17.0000 g | Freq: Every day | ORAL | 0 refills | Status: DC | PRN

## 2020-04-18 NOTE — Assessment & Plan Note (Signed)
Improving.  We will trial MiraLAX to see if that provides additional benefit.  If not continuing to improve she will let us know.

## 2020-04-18 NOTE — Telephone Encounter (Signed)
I called and spoke with the patient and informed her that the provider forget to mention that he wanted labs to check her hemoglobin, On this call she was scheduled for labs next week.  Quanisha Drewry,cma

## 2020-04-18 NOTE — Addendum Note (Signed)
Addended by: Glori Luis on: 04/18/2020 09:43 AM   Modules accepted: Orders

## 2020-04-18 NOTE — Assessment & Plan Note (Signed)
Elevated on labs from the hospital.  We will recheck today.

## 2020-04-18 NOTE — Assessment & Plan Note (Signed)
We will trial Tradjenta 5 mg once daily to see if she can tolerate this.

## 2020-04-18 NOTE — Assessment & Plan Note (Addendum)
Blood pressure is adequately controlled today.  Refill lisinopril 5 mg once daily.  Recent CMP reviewed.

## 2020-04-18 NOTE — Telephone Encounter (Signed)
The patient was seen today in the office.  I forgot to tell her to go to the lab after her visit.  Can you please contact her and let her know her hemoglobin was slightly elevated when she was in the emergency department.  I would like to have this rechecked and forgot to tell her to go to the lab.  Please see if we get her scheduled for a lab appointment sometime in the next week or so.

## 2020-04-18 NOTE — Progress Notes (Signed)
Marikay Alar, MD Phone: [phone]  Carlean Crowl Cardon is a 86 y.o. female who presents today for f/u.  Constipation: Patient notes she had issues with this over a week ago.  She had some abdominal discomfort initially.  She eventually had a bowel movement 8 days ago and then would go 3 days between bowel movements.  This morning she had 2 good bowel movements.  No straining.  No blood in her stool.  No abdominal pain currently.  She has been using senna though wants something additional to help with this.  She feels as though the constipation was brought on by the Jardiance.  Diabetes: She is staying active and is trying to watch her diet.  She gets vegetables at night.  Has added eggs to lunch.  She notes she would be okay trying Tradjenta.  Social History   Tobacco Use  Smoking Status Never Smoker  Smokeless Tobacco Never Used     ROS see history of present illness  Objective  Physical Exam Vitals:   04/18/20 0910  BP: 140/60  Pulse: (!) 104  Temp: 98.8 F (37.1 C)  SpO2: 93%    BP Readings from Last 3 Encounters:  04/18/20 140/60  04/09/20 (!) 179/68  03/14/20 (!) 150/70   Wt Readings from Last 3 Encounters:  04/18/20 145 lb (65.8 kg)  04/09/20 149 lb (67.6 kg)  03/14/20 152 lb (68.9 kg)    Physical Exam Constitutional:      General: She is not in acute distress.    Appearance: She is not diaphoretic.  Cardiovascular:     Rate and Rhythm: Normal rate and regular rhythm.     Heart sounds: Normal heart sounds.  Pulmonary:     Effort: Pulmonary effort is normal.     Breath sounds: Normal breath sounds.  Abdominal:     General: Bowel sounds are normal. There is no distension.     Palpations: Abdomen is soft.     Tenderness: There is no abdominal tenderness. There is no guarding or rebound.  Skin:    General: Skin is warm and dry.  Neurological:     Mental Status: She is alert.      Assessment/Plan: Please see individual problem list.  Problem List  Items Addressed This Visit    Constipation - Primary    Improving.  We will trial MiraLAX to see if that provides additional benefit.  If not continuing to improve she will let us know.      Relevant Medications   polyethylene glycol powder (GLYCOLAX/MIRALAX) 17 GM/SCOOP powder   Diabetes (HCC)    We will trial Tradjenta 5 mg once daily to see if she can tolerate this.      Relevant Medications   linagliptin (TRADJENTA) 5 MG TABS tablet   lisinopril (ZESTRIL) 5 MG tablet   Elevated hemoglobin (HCC)    Elevated on labs from the hospital.  We will recheck today.      Relevant Orders   CBC   HTN (hypertension)    Blood pressure is adequately controlled today.  Refill lisinopril 5 mg once daily.  Recent CMP reviewed.      Relevant Medications   lisinopril (ZESTRIL) 5 MG tablet      This visit occurred during the SARS-CoV-2 public health emergency.  Safety protocols were in place, including screening questions prior to the visit, additional usage of staff PPE, and extensive cleaning of exam room while observing appropriate contact time as indicated for disinfecting solutions.  Tommi Rumps, MD [place]

## 2020-04-18 NOTE — Patient Instructions (Signed)
Nice to see you. You can take the MiraLAX daily until you start to have good bowel movements each day. I have refilled your lisinopril. We will start you on Tradjenta for your diabetes. We will check lab work today.

## 2020-04-20 ENCOUNTER — Telehealth: Payer: Self-pay | Admitting: Family Medicine

## 2020-04-20 NOTE — Telephone Encounter (Signed)
Patient called in an stated that Dr.Sonnenberg prescribed her miralax she wanted him to know that she has taken the whole bottle and it's not working.  Zoella Roberti,cma

## 2020-04-20 NOTE — Telephone Encounter (Signed)
Patient called in an stated that Dr.Sonnenberg prescribed her miralax she wanted him to know that she has taken the whole bottle and it's not working

## 2020-04-21 NOTE — Telephone Encounter (Signed)
Noted.  She could try 1 capful of the MiraLAX twice daily to see if that provides additional benefit.

## 2020-04-24 NOTE — Telephone Encounter (Signed)
Noted.  Plan to discuss further tomorrow.

## 2020-04-24 NOTE — Telephone Encounter (Signed)
I called the patient and she stated she has been taking it twice a day and still no relief and she is having back pain, patient requested an appointment with you to discuss and is scheduled for this Tuesday. Demetrica Zipp,cma

## 2020-04-25 ENCOUNTER — Encounter: Payer: Self-pay | Admitting: Family Medicine

## 2020-04-25 ENCOUNTER — Ambulatory Visit (INDEPENDENT_AMBULATORY_CARE_PROVIDER_SITE_OTHER): Payer: Medicare Other | Admitting: Family Medicine

## 2020-04-25 ENCOUNTER — Other Ambulatory Visit (INDEPENDENT_AMBULATORY_CARE_PROVIDER_SITE_OTHER): Payer: Medicare Other

## 2020-04-25 ENCOUNTER — Other Ambulatory Visit: Payer: Self-pay

## 2020-04-25 DIAGNOSIS — M545 Low back pain, unspecified: Secondary | ICD-10-CM

## 2020-04-25 DIAGNOSIS — D582 Other hemoglobinopathies: Secondary | ICD-10-CM | POA: Diagnosis not present

## 2020-04-25 DIAGNOSIS — K5900 Constipation, unspecified: Secondary | ICD-10-CM | POA: Diagnosis not present

## 2020-04-25 DIAGNOSIS — E119 Type 2 diabetes mellitus without complications: Secondary | ICD-10-CM

## 2020-04-25 LAB — CBC
HCT: 47.4 % — ABNORMAL HIGH (ref 36.0–46.0)
Hemoglobin: 15.5 g/dL — ABNORMAL HIGH (ref 12.0–15.0)
MCHC: 32.7 g/dL (ref 30.0–36.0)
MCV: 82.9 fl (ref 78.0–100.0)
Platelets: 264 10*3/uL (ref 150.0–400.0)
RBC: 5.72 Mil/uL — ABNORMAL HIGH (ref 3.87–5.11)
RDW: 14.2 % (ref 11.5–15.5)
WBC: 11.4 10*3/uL — ABNORMAL HIGH (ref 4.0–10.5)

## 2020-04-25 NOTE — Progress Notes (Signed)
  Laurie Alar, MD Phone: [phone]  Laurie Patel is a 86 y.o. female who presents today for follow-up.  Constipation: Patient notes this improved this morning.  She had 3 good bowel movements.  She was taking MiraLAX twice daily and then last night also took Ex-Lax.  No abdominal pain with this.  Back pain: Patient notes her low back has been hurting her since she has been constipated.  She does have chronic low back discomfort though this was different.  This improved significantly after having bowel movements this morning.  No injury.  No radiation, weakness, numbness, or incontinence.  Social History   Tobacco Use  Smoking Status Never Smoker  Smokeless Tobacco Never Used     ROS see history of present illness  Objective  Physical Exam Vitals:   04/25/20 1105  BP: 130/70  Pulse: 91  Temp: 98.6 F (37 C)  SpO2: 97%    BP Readings from Last 3 Encounters:  04/25/20 130/70  04/18/20 140/60  04/09/20 (!) 179/68   Wt Readings from Last 3 Encounters:  04/25/20 144 lb (65.3 kg)  04/18/20 145 lb (65.8 kg)  04/09/20 149 lb (67.6 kg)    Physical Exam Abdominal:     General: Bowel sounds are normal. There is no distension.     Palpations: Abdomen is soft.     Tenderness: There is no abdominal tenderness. There is no guarding or rebound.  Musculoskeletal:     Comments: No midline spine tenderness, no midline spine step-off, no muscular back tenderness      Assessment/Plan: Please see individual problem list.  Problem List Items Addressed This Visit    Constipation    Ongoing issue.  Improved today with several good bowel movements.  Discussed monitoring further and if she continues to have issues with this she could call and I would send in lactulose for her to try.      Diabetes [facility])    Patient notes she will pick up the Tradjenta to try.  If she is able to tolerate if she would like patient assistance if possible.  She will let us know if she can  tolerate this medication and if so we can have her follow-up with our clinical pharmacist to discuss patient assistance options.      Low back pain    I suspect her constipation was contributing to this.  Has improved at this point.  She will monitor and if she has recurrent she will let us know.         This visit occurred during the SARS-CoV-2 public health emergency.  Safety protocols were in place, including screening questions prior to the visit, additional usage of staff PPE, and extensive cleaning of exam room while observing appropriate contact time as indicated for disinfecting solutions.    Laurie Alar, MD [facility]

## 2020-04-25 NOTE — Assessment & Plan Note (Signed)
Patient notes she will pick up the Tradjenta to try.  If she is able to tolerate if she would like patient assistance if possible.  She will let us know if she can tolerate this medication and if so we can have her follow-up with our clinical pharmacist to discuss patient assistance options.

## 2020-04-25 NOTE — Patient Instructions (Signed)
Nice to see you. Please monitor your constipation and back pain.  If they recur please let us know.

## 2020-04-25 NOTE — Assessment & Plan Note (Signed)
Ongoing issue.  Improved today with several good bowel movements.  Discussed monitoring further and if she continues to have issues with this she could call and I would send in lactulose for her to try.

## 2020-04-25 NOTE — Assessment & Plan Note (Signed)
I suspect her constipation was contributing to this.  Has improved at this point.  She will monitor and if she has recurrent she will let us know.

## 2020-04-27 ENCOUNTER — Other Ambulatory Visit: Payer: Self-pay

## 2020-04-27 DIAGNOSIS — D582 Other hemoglobinopathies: Secondary | ICD-10-CM

## 2020-05-01 ENCOUNTER — Ambulatory Visit: Payer: Medicare Other | Admitting: Pharmacist

## 2020-05-01 DIAGNOSIS — I10 Essential (primary) hypertension: Secondary | ICD-10-CM

## 2020-05-01 DIAGNOSIS — E119 Type 2 diabetes mellitus without complications: Secondary | ICD-10-CM

## 2020-05-01 DIAGNOSIS — E785 Hyperlipidemia, unspecified: Secondary | ICD-10-CM

## 2020-05-01 IMAGING — CT CT TEMPORAL BONES W/O CM
4 of 7 series · 13 of 40 positions shown, 14 images · non-contrast
Comparison: None.

CLINICAL DATA: RIGHT ear infection with bloody drainage. Hearing
loss. Imbalance and dizziness since July 2017.

EXAM:
CT TEMPORAL BONES WITHOUT CONTRAST
TECHNIQUE: Axial and coronal plane CT imaging of the petrous temporal bones was
performed with thin-collimation image reconstruction. No intravenous
contrast was administered. Multiplanar CT image reconstructions were
also generated.

[Series 2: ax bone temperal bones · axial · 0.33mm/px · z∈[-544,-508]mm · 4 of 103 slices shown, 5 images]
[im 21/103  brain]
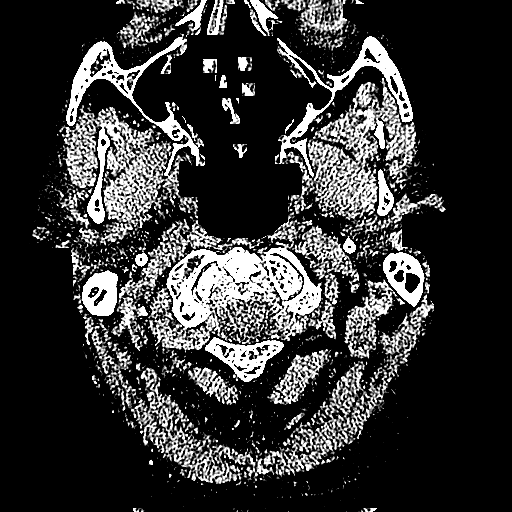
[im 21/103  bone]
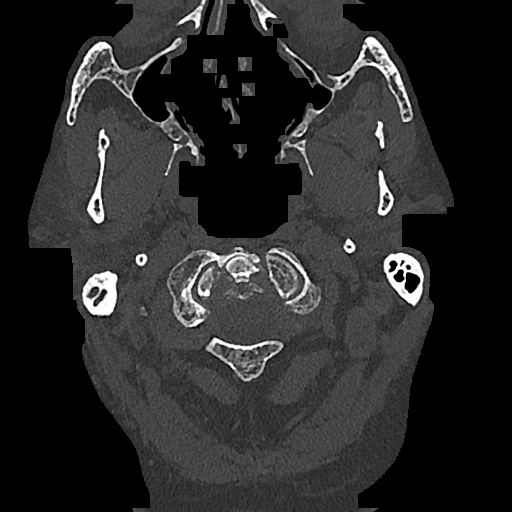
[im 41/103  bone]
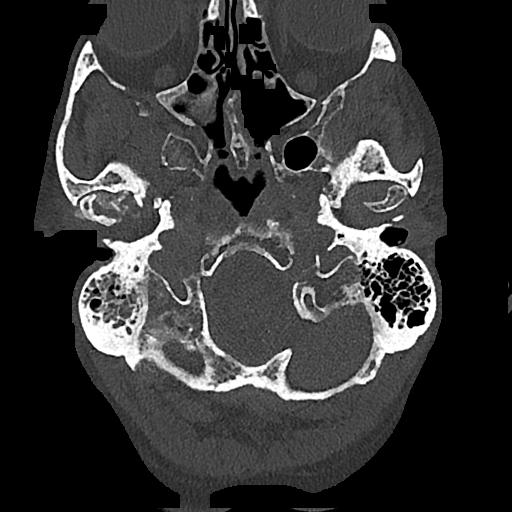
[im 62/103  bone]
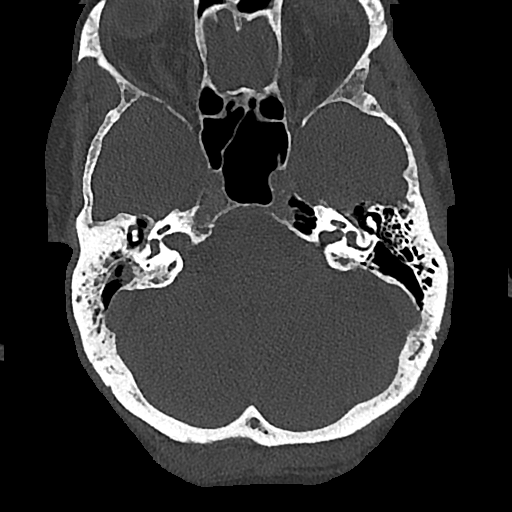
[im 82/103  bone]
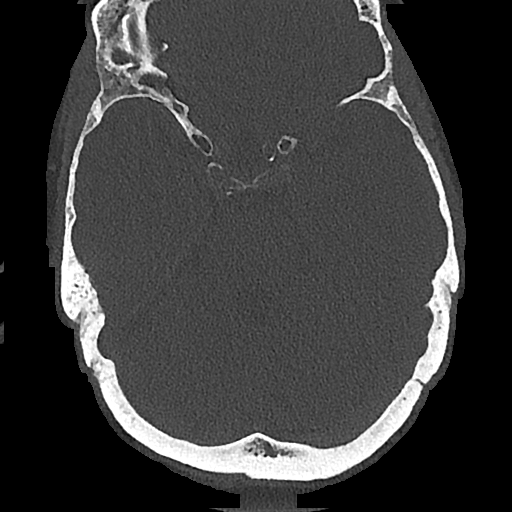

[Series 4: cor bone temperal bones · coronal · 0.12mm/px · 3 of 212 slices shown]
[im 71/212  bone]
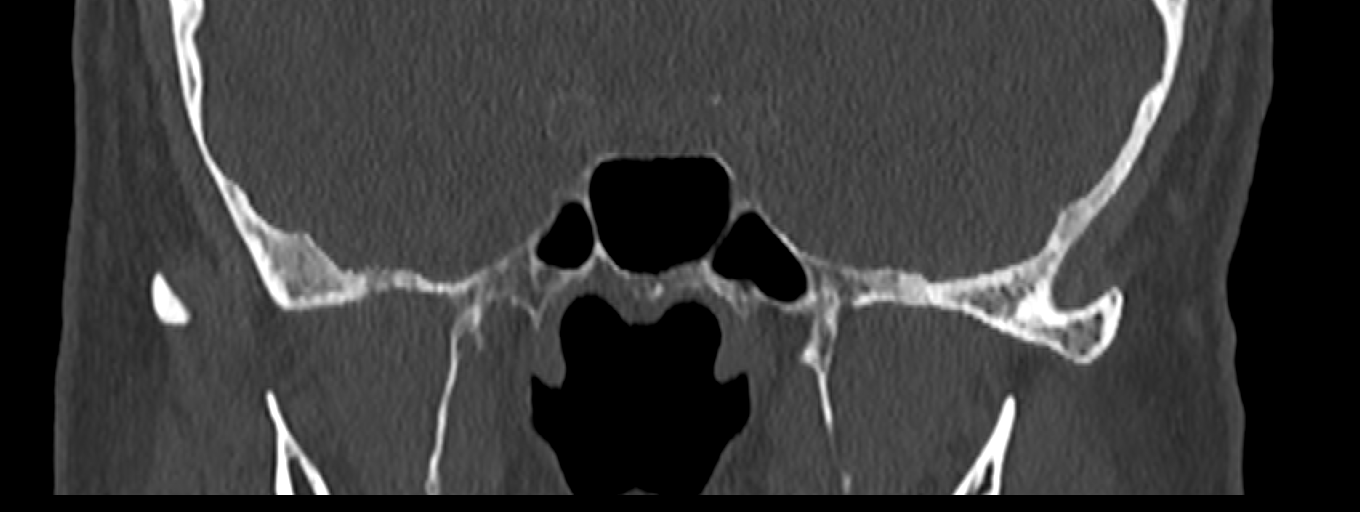
[im 94/212  bone]
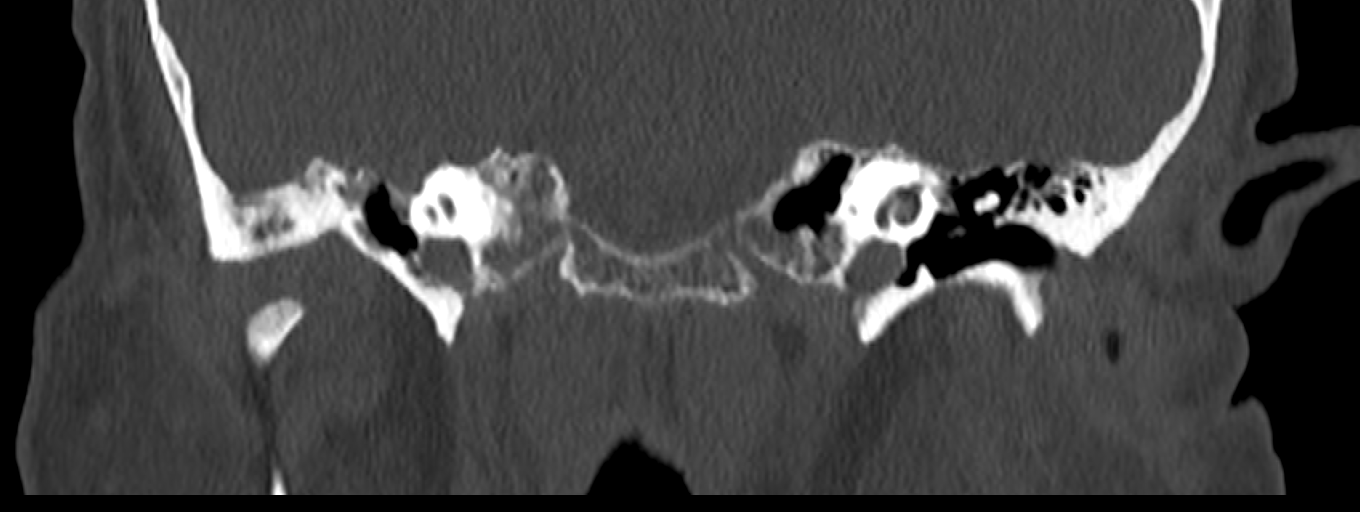
[im 118/212  bone]
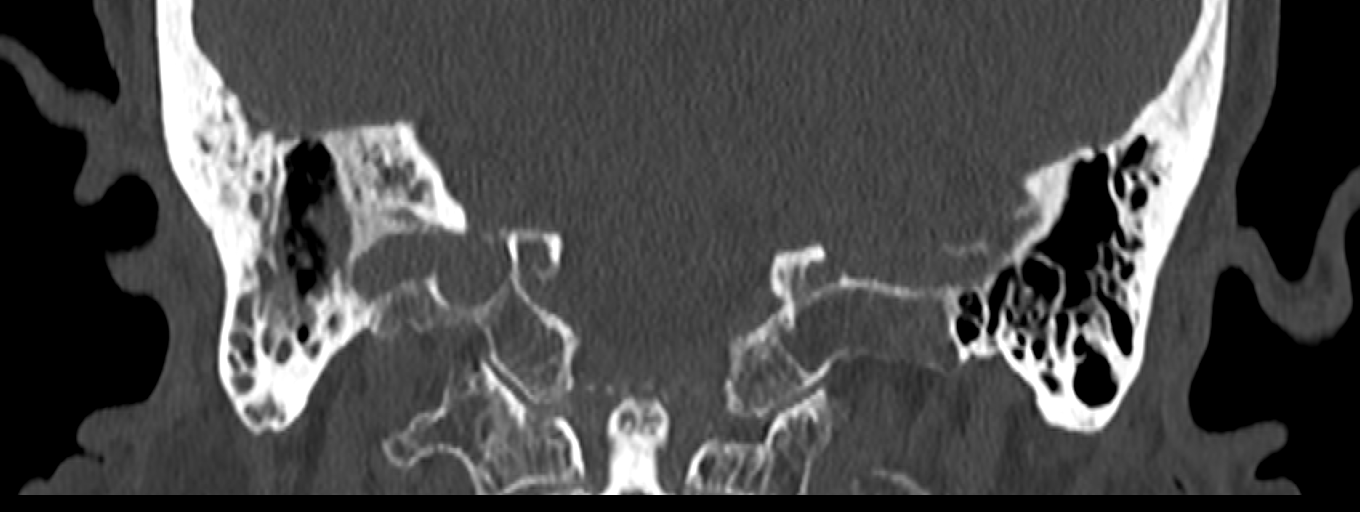

[Series 6: ax mag right temperal bones · axial · 0.20mm/px · z∈[-541,-511]mm · 3 of 103 slices shown]
[im 26/103  bone]
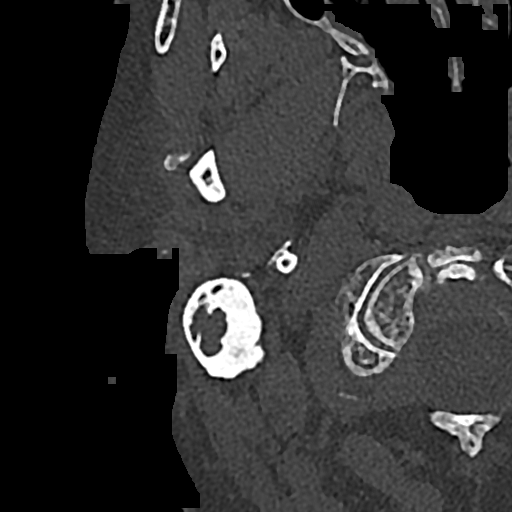
[im 52/103  bone]
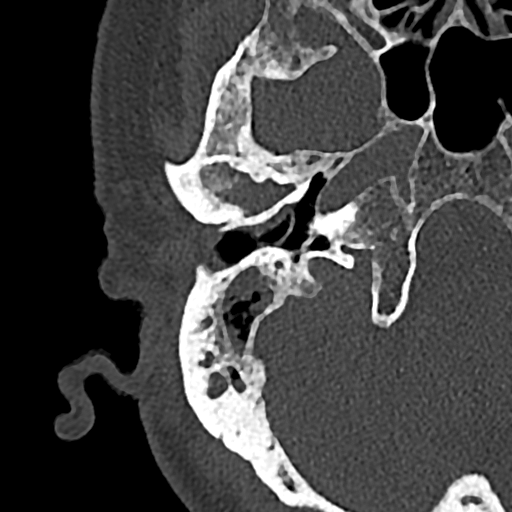
[im 77/103  bone]
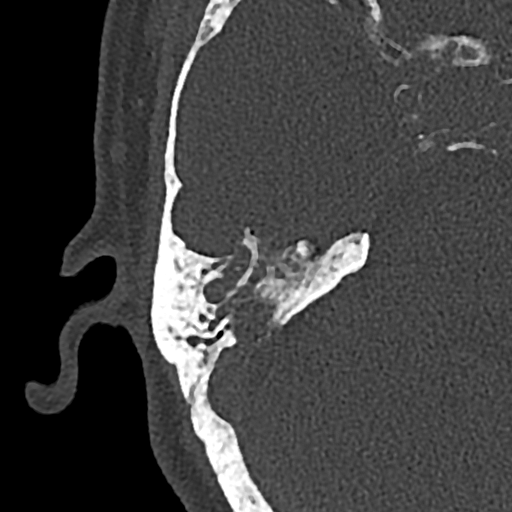

[Series 7: ax mag left temperal bones · axial · 0.20mm/px · z∈[-541,-511]mm · 3 of 103 slices shown]
[im 26/103  bone]
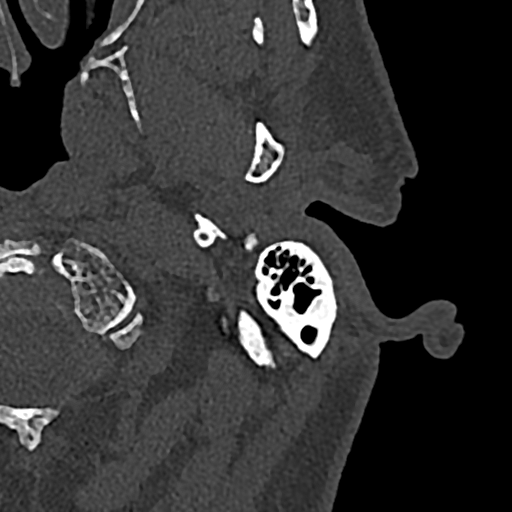
[im 52/103  bone]
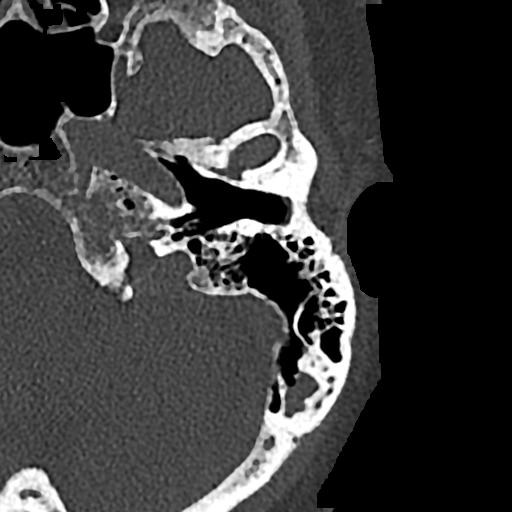
[im 77/103  bone]
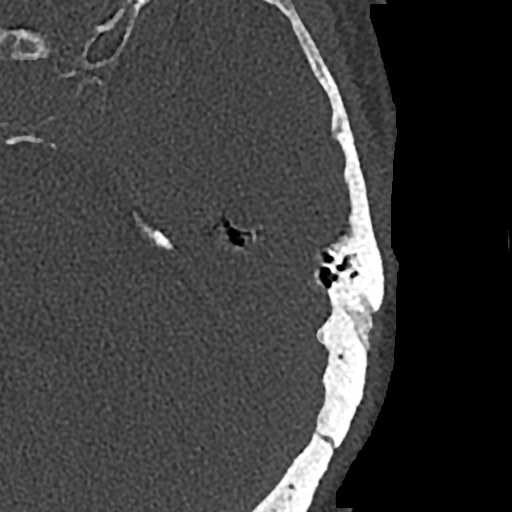

[13 of 40 positions shown; findings below may reference images not displayed]

FINDINGS: RIGHT EAR: Soft tissue lines the medial aspect of the external
canal. There is thickening of the tympanic membrane. The ossicles
appear intact. There is soft tissue in the attic of the middle ear
and surrounding the head of the malleus. There is soft tissue
adjacent to the tegmen tympani and tegmen mastoideum, with adjacent
thinning and apparent dehiscence of the tegmen at both areas. There
is extensive coalescence within the mastoid air cells, but no
layering fluid. No definite involvement of the sigmoid plate. Inner
ear structures appear normal. No definite pneumocephalus

LEFT EAR: Unremarkable.

INTRACRANIAL: No acute findings.
IMPRESSION: Chronic RIGHT otitis media. Suspected RIGHT middle ear attic and
mastoid cholesteatoma. Dehiscence of the tegmen tympani and
mastoideum without definite intracranial extension or
pneumocephalus.

Normal-appearing LEFT ear.

If concern for symptoms of imbalance and dizziness related to an
intracranial process, MRI of the brain without with contrast
recommended.

## 2020-05-01 MED ORDER — RYBELSUS 3 MG PO TABS: 3.0000 mg | ORAL_TABLET | Freq: Every day | ORAL | 2 refills | Status: DC

## 2020-05-01 NOTE — Patient Instructions (Signed)
Laurie Patel,   It was great talking to you today!  When you pick this up, stop Tradjenta and start Rybelsus 3 mg daily. Take this medication on an empty stomach in the morning, and wait at least 30 minutes before eating or taking other medications. This is important for the medication's optimal absorption and efficacy.   Here are 2 samples of FreeStyle Libre 2 sensors that we have 1 month of readings to determine the impact of therapy. When using [place], we would like to see your blood sugar in the goal range of 80-180 at least 70% of the time.   Call me with any questions or concerns!  [name] Darnelle Maffucci, PharmD [phone]  Visit Information  Goals Addressed              This Visit's Progress     Patient Stated   .  PharmD "I can't afford this medication" (pt-stated)        CARE PLAN ENTRY (see longtitudinal plan of care for additional care plan information)  Current Barriers:  . Social, community, and financial barriers: o Reports doing much better now that her bowels are better regulated. Does not feel any different now that she is taking Tradjenta. Has not been checking BG, but wonders if Rybelsus would be a more effective option, she has done research on this. . Diabetes: uncontrolled; complicated by chronic medical conditions including HTN, HLD, osteoporosis; most recent A1c 8.3% . Most recent eGFR: 81 mL/min . Current antihyperglycemic regimen: Tradjenta 5 mg  o Hx metformin, unknown reason for discontinuation o Hx Jardiance, reports significant constipation after starting this medication . Current blood glucose readings:  . Cardiovascular risk reduction: o Current hypertensive regimen: lisinopril 5 mg daily; BP appropriately managed given age o Current hyperlipidemia regimen: none; not necessary given age o Current antiplatelet regimen: ASA 81 mg daily  . Osteoporosis: T score -3.8 at wrist, -3.2 at femoral neck. Declined therapy.   Pharmacist Clinical Goal(s):  Marland Kitchen Over the  next 90 days, patient will work with PharmD and primary care provider to address optimized medication management  Interventions: . Comprehensive medication review performed, medication list updated in electronic medical record . Inter-disciplinary care team collaboration (see longitudinal plan of care) . Reviewed mechanism of DPP4 vs GLP1. Patient willing to trial GLP1, which is more likely to provide greater glycemic benefit than Tradjenta. Patient unwilling to perform SMBG checks to evaluate impact of therapy. Will have patient continue home supply of Tradjenta. She comes in for lab work; will pick up Rybelsus 3 mg sample and replace Trajenta with this. Will also pick up 2 sample Libre sensors to wear for 28 days to help evaluate impact of therapy prior to next A1c. If patient tolerates therapy, will pursue patient assistance.   Patient Self Care Activities:  . Patient will take medications as prescribed  Please see past updates related to this goal by clicking on the "Past Updates" button in the selected goal         Print copy of patient instructions provided.    Plan:  - Scheduled f/u call in ~ 5 weeks  [name] Darnelle Maffucci, PharmD, [place], [place] Pharmacist [place] [phone]

## 2020-05-01 NOTE — Chronic Care Management (AMB) (Signed)
Chronic Care Management   Follow Up Note   05/01/2020 Name: Laurie Patel MRN: [id number] DOB: Aug 05, 1927  Referred by: Leone Haven, MD Reason for referral : Chronic Care Management (Medication Management)   Laurie Patel is a 86 y.o. year old female who is a primary care patient of [name], [name], MD. The CCM team was consulted for assistance with chronic disease management and care coordination needs.    Contacted patient for medication management f/u.  Review of patient status, including review of consultants reports, relevant laboratory and other test results, and collaboration with appropriate care team members and the patient's provider was performed as part of comprehensive patient evaluation and provision of chronic care management services.    SDOH (Social Determinants of Health) assessments performed: Yes See Care Plan activities for detailed interventions related to SDOH)  SDOH Interventions     Most Recent Value  SDOH Interventions  Financial Strain Interventions Other (Comment)  [manufacturer assistance]       Outpatient Encounter Medications as of 05/01/2020  Medication Sig  . aspirin 81 MG EC tablet Take 81 mg by mouth daily. Swallow whole.  . blood glucose meter kit and supplies KIT Dispense based on patient and insurance preference. Use once daily. E11.9.  . Lancets (ONETOUCH DELICA PLUS [id number]) MISC USE ONE DAILY  . lisinopril (ZESTRIL) 5 MG tablet TAKE ONE TABLET BY MOUTH DAILY  . Multiple Vitamins-Calcium (ONE-A-DAY WOMENS PO) Take 1 tablet by mouth daily.  Glory Rosebush VERIO test strip   . polyethylene glycol powder (GLYCOLAX/MIRALAX) 17 GM/SCOOP powder Take 17 g by mouth daily as needed for mild constipation.  . [DISCONTINUED] linagliptin (TRADJENTA) 5 MG TABS tablet Take 1 tablet (5 mg total) by mouth daily.  . Continuous Blood Gluc Receiver (FREESTYLE LIBRE 14 DAY READER) DEVI USE WITH SENSORS TO MONITOR BLOOD GLUCOSE (Patient not taking:  Reported on 05/01/2020)  . Continuous Blood Gluc Sensor (FREESTYLE LIBRE 14 DAY SENSOR) MISC Use one sensory every 14 days to check blood glucose readings. (Patient not taking: Reported on 05/01/2020)  . Semaglutide (RYBELSUS) 3 MG TABS Take 3 mg by mouth daily.  . [DISCONTINUED] empagliflozin (JARDIANCE) 10 MG TABS tablet    No facility-administered encounter medications on file as of 05/01/2020.     Objective:   Goals Addressed              This Visit's Progress     Patient Stated   .  PharmD "I can't afford this medication" (pt-stated)        CARE PLAN ENTRY (see longtitudinal plan of care for additional care plan information)  Current Barriers:  . Social, community, and financial barriers: o Reports doing much better now that her bowels are better regulated. Does not feel any different now that she is taking Tradjenta. Has not been checking BG, but wonders if Rybelsus would be a more effective option, she has done research on this. . Diabetes: uncontrolled; complicated by chronic medical conditions including HTN, HLD, osteoporosis; most recent A1c 8.3% . Most recent eGFR: 81 mL/min . Current antihyperglycemic regimen: Tradjenta 5 mg  o Hx metformin, unknown reason for discontinuation o Hx Jardiance, reports significant constipation after starting this medication . Current blood glucose readings:  . Cardiovascular risk reduction: o Current hypertensive regimen: lisinopril 5 mg daily; BP appropriately managed given age o Current hyperlipidemia regimen: none; not necessary given age o Current antiplatelet regimen: ASA 81 mg daily  . Osteoporosis: T score -3.8 at wrist, -3.2  at femoral neck. Declined therapy.   Pharmacist Clinical Goal(s):  Marland Kitchen Over the next 90 days, patient will work with PharmD and primary care provider to address optimized medication management  Interventions: . Comprehensive medication review performed, medication list updated in electronic medical  record . Inter-disciplinary care team collaboration (see longitudinal plan of care) . Reviewed mechanism of DPP4 vs GLP1. Patient willing to trial GLP1, which is more likely to provide greater glycemic benefit than Tradjenta. Patient unwilling to perform SMBG checks to evaluate impact of therapy. Will have patient continue home supply of Tradjenta. She comes in for lab work; will pick up Rybelsus 3 mg sample and replace Trajenta with this. Will also pick up 2 sample Libre sensors to wear for 28 days to help evaluate impact of therapy prior to next A1c. If patient tolerates therapy, will pursue patient assistance.   Patient Self Care Activities:  . Patient will take medications as prescribed  Please see past updates related to this goal by clicking on the "Past Updates" button in the selected goal          Plan:  - Scheduled f/u call in ~ 5 weeks  [name] Darnelle Maffucci, PharmD, [place], [place] Pharmacist [place] [phone]

## 2020-05-16 ENCOUNTER — Other Ambulatory Visit: Payer: Self-pay

## 2020-05-16 ENCOUNTER — Other Ambulatory Visit (INDEPENDENT_AMBULATORY_CARE_PROVIDER_SITE_OTHER): Payer: Medicare Other

## 2020-05-16 DIAGNOSIS — D582 Other hemoglobinopathies: Secondary | ICD-10-CM | POA: Diagnosis not present

## 2020-05-16 LAB — CBC
HCT: 46.7 % — ABNORMAL HIGH (ref 36.0–46.0)
Hemoglobin: 15.2 g/dL — ABNORMAL HIGH (ref 12.0–15.0)
MCHC: 32.5 g/dL (ref 30.0–36.0)
MCV: 83.2 fl (ref 78.0–100.0)
Platelets: 236 10*3/uL (ref 150.0–400.0)
RBC: 5.61 Mil/uL — ABNORMAL HIGH (ref 3.87–5.11)
RDW: 14.2 % (ref 11.5–15.5)
WBC: 7.8 10*3/uL (ref 4.0–10.5)

## 2020-05-23 DIAGNOSIS — H35373 Puckering of macula, bilateral: Secondary | ICD-10-CM | POA: Diagnosis not present

## 2020-05-23 DIAGNOSIS — E119 Type 2 diabetes mellitus without complications: Secondary | ICD-10-CM | POA: Diagnosis not present

## 2020-05-23 LAB — HM DIABETES EYE EXAM

## 2020-05-29 ENCOUNTER — Telehealth: Payer: Medicare Other

## 2020-05-30 ENCOUNTER — Telehealth: Payer: Medicare Other

## 2020-06-02 ENCOUNTER — Telehealth: Payer: Self-pay | Admitting: Family Medicine

## 2020-06-02 ENCOUNTER — Ambulatory Visit: Payer: Medicare Other | Admitting: Pharmacist

## 2020-06-02 DIAGNOSIS — E119 Type 2 diabetes mellitus without complications: Secondary | ICD-10-CM

## 2020-06-02 DIAGNOSIS — I10 Essential (primary) hypertension: Secondary | ICD-10-CM

## 2020-06-02 MED ORDER — LINAGLIPTIN 5 MG PO TABS: 5.0000 mg | ORAL_TABLET | Freq: Every day | ORAL | 3 refills | Status: DC

## 2020-06-02 NOTE — Telephone Encounter (Signed)
[name] reached the patient and this is handled.  Zahava Quant,cma

## 2020-06-02 NOTE — Telephone Encounter (Signed)
patient called checking on her medication was not sure she is suppose to be taking it did not really know the name of it

## 2020-06-02 NOTE — Patient Instructions (Addendum)
Ms. Mccolm,   Please complete the [employer] application to receive Tradjenta for free for 2022. Please return to me with proof of your income (social security income statement, even if for 2020) and we will get this sent in for you.   Take care!  [name] Feliz Beam, PharmD [phone]     Visit Information Patient Care Plan: Medication Management    Problem Identified: Diabetes, Hypertension     Long-Range Goal: Disease Progression Prevention   This Visit's Progress: On track  Recent Progress: On track  Priority: High  Note:   Current Barriers:  . Unable to independently afford treatment regimen . Unable to achieve control of diabetes   Pharmacist Clinical Goal(s):  Marland Kitchen Over the next 90 days, patient will verbalize ability to afford treatment regimen. . Over the next 90 days, patient will achieve control of diabetes as evidenced by improvement in A1c through collaboration with PharmD and provider  Interventions: . Inter-disciplinary care team collaboration (see longitudinal plan of care) . Comprehensive medication review performed; medication list updated in electronic medical record  Diabetes: . Uncontrolled; current treatment:  Tradjenta 5 mg daily. Tried Rybelsus, but reports stomach upset. Would prefer to resume Tradjenta.  Reports she has a few pills left.  o Hx metformin, unknown reason for discontinuation o Hx Jardiance, reports significant constipation after starting this medication . Current glucose readings: not checking. Refuses BG checks. Not interested in paying $75 cash price for [employer] . Working on reducing carbohydrate intake, increasing fruits and vegetables.  . Agree to continue Tradjenta. Sample provided. Will start process to reapply for Tradjenta assistance through [facility]. Will collaborate with provider and CPhT . More relaxed goal of A1c <8% without significant side effects likely appropriate given age, comorbidities, hx intolerances.    Hypertension: . Controlled; current treatment: lisinopril 5 mg daily  . Recommended to continue current regiment at this time  Patient Goals/Self-Care Activities . Over the next 90 days, patient will:  - take medications as prescribed collaborate with provider on medication access solutions  Follow Up Plan:  Telephone follow up appointment with care management team member scheduled for: ~ 6 weeks      The patient verbalized understanding of instructions, educational materials, and care plan provided today and agreed to receive a mailed copy of patient instructions, educational materials, and care plan.   Plan: Telephone follow up appointment with care management team member scheduled for: ~ 6 weeks  [name] Feliz Beam, PharmD, [place], CPP Clinical Pharmacist [facility] [employer] [phone]

## 2020-06-02 NOTE — Chronic Care Management (AMB) (Signed)
Chronic Care Management   Pharmacy Note  06/02/2020 Name: Laurie Patel MRN: [id number] DOB: 1927-10-20   Subjective:  Laurie Patel is a 86 y.o. year old female who is a primary care patient of Birdie Sons, Yehuda Mao, MD. The CCM team was consulted for assistance with chronic disease management and care coordination needs.    Engaged with patient by telephone for follow up visit in response to provider referral for pharmacy case management and/or care coordination services.   Consent to Services:  Ms. Dunsworth was given information about Chronic Care Management services, agreed to services, and gave verbal consent prior to initiation of services on 08/12/19. Please see initial visit note for detailed documentation.   SDOH (Social Determinants of Health) assessments and interventions performed:  SDOH Interventions   Flowsheet Row Most Recent Value  SDOH Interventions   Financial Strain Interventions Other (Comment)  [manufacturer assistance]       Objective:  Lab Results  Component Value Date   CREATININE 0.58 04/09/2020   CREATININE 0.68 03/14/2020   CREATININE 0.73 07/13/2019    Lab Results  Component Value Date   HGBA1C 8.3 (H) 03/14/2020       Component Value Date/Time   CHOL 173 03/14/2020 0941   TRIG 117.0 03/14/2020 0941   HDL 36.20 (L) 03/14/2020 0941   CHOLHDL 5 03/14/2020 0941   VLDL 23.4 03/14/2020 0941   LDLCALC 114 (H) 03/14/2020 0941   LDLDIRECT 147.0 03/21/2016 0926    BP Readings from Last 3 Encounters:  04/25/20 130/70  04/18/20 140/60  04/09/20 (!) 179/68    Assessment/Interventions: Review of patient past medical history, allergies, medications, health status, including review of consultants reports, laboratory and other test data, was performed as part of comprehensive evaluation and provision of chronic care management services.   Allergies  Allergen Reactions  . Augmentin [Amoxicillin-Pot Clavulanate] Nausea And Vomiting  . Prednisone      Medications Reviewed Today    Reviewed by Lourena Simmonds, RPH-CPP (Pharmacist) on 06/02/20 at 1056  Med List Status: <None>  Medication Order Taking? Sig Documenting Provider Last Dose Status Informant  aspirin 81 MG EC tablet [id number] Yes Take 81 mg by mouth daily. Swallow whole. [provider] Taking Active Self  lisinopril (ZESTRIL) 5 MG tablet [id number] Yes TAKE ONE TABLET BY MOUTH DAILY Glori Luis, MD Taking Active   Multiple Vitamins-Calcium (ONE-A-DAY WOMENS PO) [id number] Yes Take 1 tablet by mouth daily. [provider] Taking Active Self  polyethylene glycol powder (GLYCOLAX/MIRALAX) 17 GM/SCOOP powder [id number] Yes Take 17 g by mouth daily as needed for mild constipation. Glori Luis, MD Taking Active           Patient Active Problem List   Diagnosis Date Noted  . Constipation 04/18/2020  . Elevated hemoglobin (HCC) 04/18/2020  . Bilateral hand numbness 03/14/2020  . Skin lesions 11/09/2019  . Ear discomfort, right 05/12/2018  . Accumulation of fluid in tissues 02/16/2018  . Dermatitis, eczematoid 02/16/2018  . H/O: osteoarthritis 02/16/2018  . Irregular cardiac rhythm 02/16/2018  . Low back pain 02/16/2018  . Nonspecific abnormal finding in stool contents 02/16/2018  . Balance problem 10/14/2017  . Palpitations 06/21/2016  . Hyperlipidemia 03/21/2016  . Allergic rhinitis 03/21/2016  . Diabetes (HCC) 09/14/2015  . HTN (hypertension) 08/27/2014  . Breathlessness on exertion 10/05/2009  . OP (osteoporosis) 09/04/2009    Medication Assistance: Application for Tradjenta medication assistance program in process. Anticipated assistance start date TBD. See plan of care below  for additional detail.   Patient Care Plan: Medication Management    Problem Identified: Diabetes, Hypertension     Long-Range Goal: Disease Progression Prevention   This Visit's Progress: On track  Recent Progress: On track  Priority: High  Note:    Current Barriers:  . Unable to independently afford treatment regimen . Unable to achieve control of diabetes   Pharmacist Clinical Goal(s):  Marland Kitchen Over the next 90 days, patient will verbalize ability to afford treatment regimen. . Over the next 90 days, patient will achieve control of diabetes as evidenced by improvement in A1c through collaboration with PharmD and provider  Interventions: . Inter-disciplinary care team collaboration (see longitudinal plan of care) . Comprehensive medication review performed; medication list updated in electronic medical record  Diabetes: . Uncontrolled; current treatment:  Tradjenta 5 mg daily. Tried Rybelsus, but reports stomach upset. Would prefer to resume Tradjenta.  Reports she has a few pills left.  o Hx metformin, unknown reason for discontinuation o Hx Jardiance, reports significant constipation after starting this medication . Current glucose readings: not checking. Refuses BG checks. Not interested in paying $75 cash price for [employer] . Working on reducing carbohydrate intake, increasing fruits and vegetables.  . Agree to continue Tradjenta. Sample provided. Will start process to reapply for Tradjenta assistance through [facility]. Will collaborate with provider and CPhT . More relaxed goal of A1c <8% without significant side effects likely appropriate given age, comorbidities, hx intolerances.   Hypertension: . Controlled; current treatment: lisinopril 5 mg daily  . Recommended to continue current regiment at this time  Patient Goals/Self-Care Activities . Over the next 90 days, patient will:  - take medications as prescribed collaborate with provider on medication access solutions  Follow Up Plan:  Telephone follow up appointment with care management team member scheduled for: ~ 4 weeks       Plan: Telephone follow up appointment with care management team member scheduled for: ~ 6 weeks  [name] Feliz Beam, PharmD, [place],  CPP Clinical Pharmacist [facility] [employer] [phone]

## 2020-06-06 ENCOUNTER — Ambulatory Visit: Payer: Medicare Other | Admitting: Pharmacist

## 2020-06-06 ENCOUNTER — Telehealth: Payer: Medicare Other

## 2020-06-06 DIAGNOSIS — I10 Essential (primary) hypertension: Secondary | ICD-10-CM

## 2020-06-06 DIAGNOSIS — E119 Type 2 diabetes mellitus without complications: Secondary | ICD-10-CM

## 2020-06-06 DIAGNOSIS — E785 Hyperlipidemia, unspecified: Secondary | ICD-10-CM

## 2020-06-06 NOTE — Patient Instructions (Signed)
Visit Information  Patient Care Plan: Medication Management    Problem Identified: Diabetes, Hypertension     Long-Range Goal: Disease Progression Prevention   Recent Progress: On track  Priority: High  Note:   Current Barriers:  . Unable to independently afford treatment regimen . Unable to achieve control of diabetes   Pharmacist Clinical Goal(s):  Marland Kitchen Over the next 90 days, patient will verbalize ability to afford treatment regimen. . Over the next 90 days, patient will achieve control of diabetes as evidenced by improvement in A1c through collaboration with PharmD and provider  Interventions: . Inter-disciplinary care team collaboration (see longitudinal plan of care) . Comprehensive medication review performed; medication list updated in electronic medical record  Diabetes: . Uncontrolled (though more relaxed goal of A1c <8% likely appropriate given age, comorbidities, hx intolerances); current treatment:  Tradjenta 5 mg daily o Hx metformin, unknown reason for discontinuation o Hx Jardiance, reports significant constipation after starting this medication . Received patient and provider portions of BI Cares application. Submitted on behalf of patient today. Will collaborate w/ CphT for follow up   Hypertension: . Controlled; current treatment: lisinopril 5 mg daily  . Recommended to continue current regiment at this time  Patient Goals/Self-Care Activities . Over the next 90 days, patient will:  - take medications as prescribed collaborate with provider on medication access solutions  Follow Up Plan:  Telephone follow up appointment with care management team member scheduled for: ~ 5 weeks       The patient verbalized understanding of instructions, educational materials, and care plan provided today and declined offer to receive copy of patient instructions, educational materials, and care plan.   Plan: Telephone follow up appointment with care management team member  scheduled for: ~ 5 weeks  [name] Feliz Beam, PharmD, [place], CPP Clinical Pharmacist [employer] [employer] [phone]

## 2020-06-06 NOTE — Chronic Care Management (AMB) (Signed)
Chronic Care Management   Pharmacy Note  06/06/2020 Name: Laurie Patel MRN: [id number] DOB: March 05, 1928  Subjective:  Laurie Patel Poust is a 86 y.o. year old female who is a primary care patient of Birdie Sons, Yehuda Mao, MD. The CCM team was consulted for assistance with chronic disease management and care coordination needs.    Care coordination completed today in response to provider referral for pharmacy case management and/or care coordination services.   Consent to Services:  Laurie Patel was given information about Chronic Care Management services, agreed to services, and gave verbal consent prior to initiation of services on 08/22/19. Please see initial visit note for detailed documentation.   Objective:  Lab Results  Component Value Date   CREATININE 0.58 04/09/2020   CREATININE 0.68 03/14/2020   CREATININE 0.73 07/13/2019    Lab Results  Component Value Date   HGBA1C 8.3 (H) 03/14/2020       Component Value Date/Time   CHOL 173 03/14/2020 0941   TRIG 117.0 03/14/2020 0941   HDL 36.20 (L) 03/14/2020 0941   CHOLHDL 5 03/14/2020 0941   VLDL 23.4 03/14/2020 0941   LDLCALC 114 (H) 03/14/2020 0941   LDLDIRECT 147.0 03/21/2016 0926     BP Readings from Last 3 Encounters:  04/25/20 130/70  04/18/20 140/60  04/09/20 (!) 179/68    Assessment/Interventions: Review of patient past medical history, allergies, medications, health status, including review of consultants reports, laboratory and other test data, was performed as part of comprehensive evaluation and provision of chronic care management services.   SDOH (Social Determinants of Health) assessments and interventions performed:  SDOH Interventions   Flowsheet Row Most Recent Value  SDOH Interventions   Financial Strain Interventions Other (Comment)  [manufacturer assistance]       CCM Care Plan  Allergies  Allergen Reactions  . Augmentin [Amoxicillin-Pot Clavulanate] Nausea And Vomiting  . Prednisone      Medications Reviewed Today    Reviewed by Lourena Simmonds, RPH-CPP (Pharmacist) on 06/06/20 at 1533  Med List Status: <None>  Medication Order Taking? Sig Documenting Provider Last Dose Status Informant  aspirin 81 MG EC tablet [id number] No Take 81 mg by mouth daily. Swallow whole. [provider] Taking Active Self  linagliptin (TRADJENTA) 5 MG TABS tablet [id number]  Take 1 tablet (5 mg total) by mouth daily. Laurie Luis, MD  Active   lisinopril (ZESTRIL) 5 MG tablet [id number] No TAKE ONE TABLET BY MOUTH DAILY Laurie Luis, MD Taking Active   Multiple Vitamins-Calcium (ONE-A-DAY WOMENS PO) [id number] No Take 1 tablet by mouth daily. [provider] Taking Active Self  polyethylene glycol powder (GLYCOLAX/MIRALAX) 17 GM/SCOOP powder [id number] No Take 17 g by mouth daily as needed for mild constipation. Laurie Luis, MD Taking Active           Patient Active Problem List   Diagnosis Date Noted  . Constipation 04/18/2020  . Elevated hemoglobin (HCC) 04/18/2020  . Bilateral hand numbness 03/14/2020  . Skin lesions 11/09/2019  . Ear discomfort, right 05/12/2018  . Accumulation of fluid in tissues 02/16/2018  . Dermatitis, eczematoid 02/16/2018  . H/O: osteoarthritis 02/16/2018  . Irregular cardiac rhythm 02/16/2018  . Low back pain 02/16/2018  . Nonspecific abnormal finding in stool contents 02/16/2018  . Balance problem 10/14/2017  . Palpitations 06/21/2016  . Hyperlipidemia 03/21/2016  . Allergic rhinitis 03/21/2016  . Diabetes (HCC) 09/14/2015  . HTN (hypertension) 08/27/2014  . Breathlessness on exertion 10/05/2009  . OP (osteoporosis)  09/04/2009    Conditions to be addressed/monitored per PCP order: HTN and DMII  Patient Care Plan: Medication Management    Problem Identified: Diabetes, Hypertension     Long-Range Goal: Disease Progression Prevention   Recent Progress: On track  Priority: High  Note:   Current Barriers:   . Unable to independently afford treatment regimen . Unable to achieve control of diabetes   Pharmacist Clinical Goal(s):  Marland Kitchen Over the next 90 days, patient will verbalize ability to afford treatment regimen. . Over the next 90 days, patient will achieve control of diabetes as evidenced by improvement in A1c through collaboration with PharmD and provider  Interventions: . Inter-disciplinary care team collaboration (see longitudinal plan of care) . Comprehensive medication review performed; medication list updated in electronic medical record  Diabetes: . Uncontrolled (though more relaxed goal of A1c <8% likely appropriate given age, comorbidities, hx intolerances); current treatment:  Tradjenta 5 mg daily o Hx metformin, unknown reason for discontinuation o Hx Jardiance, reports significant constipation after starting this medication . Received patient and provider portions of BI Cares application. Submitted on behalf of patient today. Will collaborate w/ CphT for follow up   Hypertension: . Controlled; current treatment: lisinopril 5 mg daily  . Recommended to continue current regiment at this time  Patient Goals/Self-Care Activities . Over the next 90 days, patient will:  - take medications as prescribed collaborate with provider on medication access solutions  Follow Up Plan:  Telephone follow up appointment with care management team member scheduled for: ~ 5 weeks      Medication Assistance: Application for BI (Tradjenta) medication assistance program in process. Anticipated assistance start date TBD. See plan of care below for additional detail.    Plan: Telephone follow up appointment with care management team member scheduled for: ~ 5 weeks  Laurie Patel, PharmD, [place], CPP Clinical Pharmacist [employer] [employer] [phone]

## 2020-06-26 ENCOUNTER — Encounter: Payer: Self-pay | Admitting: Podiatry

## 2020-06-26 ENCOUNTER — Other Ambulatory Visit: Payer: Self-pay

## 2020-06-26 ENCOUNTER — Ambulatory Visit: Payer: Medicare Other | Admitting: Podiatry

## 2020-06-26 DIAGNOSIS — B351 Tinea unguium: Secondary | ICD-10-CM | POA: Diagnosis not present

## 2020-06-26 DIAGNOSIS — M79676 Pain in unspecified toe(s): Secondary | ICD-10-CM

## 2020-06-26 DIAGNOSIS — E119 Type 2 diabetes mellitus without complications: Secondary | ICD-10-CM

## 2020-06-26 NOTE — Progress Notes (Signed)
This patient returns to my office for at risk foot care.  This patient requires this care by a professional since this patient will be at risk due to having diabetes.   This patient is unable to cut nails herself since the patient cannot reach her nails.These nails are painful walking and wearing shoes.  This patient presents for at risk foot care today.  General Appearance  Alert, conversant and in no acute stress.  Vascular  Dorsalis pedis and posterior tibial  pulses are palpable  bilaterally.  Capillary return is within normal limits  bilaterally. Temperature is within normal limits  bilaterally.  Neurologic  Senn-Weinstein monofilament wire test within normal limits  bilaterally. Muscle power within normal limits bilaterally.  Nails Thick disfigured discolored nails with subungual debris  from hallux to fifth toenails left and 2-5 toenails right..  Absence of right hallux nail. No evidence of bacterial infection or drainage bilaterally.  Orthopedic  No limitations of motion  feet .  No crepitus or effusions noted.  No bony pathology or digital deformities noted.  Skin  normotropic skin with no porokeratosis noted bilaterally.  No signs of infections or ulcers noted.     Onychomycosis  Pain in right toes  Pain in left toes  Consent was obtained for treatment procedures.   Mechanical debridement of nails 1-5  Left and 2-5 right  performed with a nail nipper with the exception right hallux toenail.Ceasar Mons with dremel without incident.    Return office visit  10 weeks                    Told patient to return for periodic foot care and evaluation due to potential at risk complications.   Helane Gunther DPM

## 2020-07-10 ENCOUNTER — Ambulatory Visit: Payer: Medicare Other | Admitting: Pharmacist

## 2020-07-10 ENCOUNTER — Ambulatory Visit (INDEPENDENT_AMBULATORY_CARE_PROVIDER_SITE_OTHER): Payer: Medicare Other

## 2020-07-10 VITALS — Ht 65.0 in | Wt 144.0 lb

## 2020-07-10 DIAGNOSIS — Z0000 Encounter for general adult medical examination without abnormal findings: Secondary | ICD-10-CM

## 2020-07-10 DIAGNOSIS — E119 Type 2 diabetes mellitus without complications: Secondary | ICD-10-CM

## 2020-07-10 DIAGNOSIS — I10 Essential (primary) hypertension: Secondary | ICD-10-CM

## 2020-07-10 NOTE — Progress Notes (Signed)
Subjective:   Laurie Patel is a 87 y.o. female who presents for Medicare Annual (Subsequent) preventive examination.  Review of Systems    No ROS.  Medicare Wellness Virtual Visit.    Cardiac Risk Factors include: advanced age (>17men, >84 women);hypertension;diabetes mellitus     Objective:    Today's Vitals   07/10/20 0942  Weight: 144 lb (65.3 kg)  Height: 5\' 5"  (1.651 m)   Body mass index is 23.96 kg/m.  Advanced Directives 07/10/2020 04/09/2020 07/08/2019 07/02/2018 06/26/2017 03/05/2017  Does Patient Have a Medical Advance Directive? No No Yes No No No  Type of Advance Directive - 05/05/2017;Living will - - -  Does patient want to make changes to medical advance directive? - - - No - Patient declined - -  Copy of Healthcare Power of Attorney in Chart? - - No - copy requested - - -  Would patient like information on creating a medical advance directive? No - Patient declined - - - No - Patient declined -    Current Medications (verified) Outpatient Encounter Medications as of 07/10/2020  Medication Sig  . aspirin 81 MG EC tablet Take 81 mg by mouth daily. Swallow whole.  . linagliptin (TRADJENTA) 5 MG TABS tablet Take 1 tablet (5 mg total) by mouth daily.  07/12/2020 lisinopril (ZESTRIL) 5 MG tablet TAKE ONE TABLET BY MOUTH DAILY  . Multiple Vitamins-Calcium (ONE-A-DAY WOMENS PO) Take 1 tablet by mouth daily.  . polyethylene glycol powder (GLYCOLAX/MIRALAX) 17 GM/SCOOP powder Take 17 g by mouth daily as needed for mild constipation.   No facility-administered encounter medications on file as of 07/10/2020.    Allergies (verified) Augmentin [amoxicillin-pot clavulanate] and Prednisone   History: Past Medical History:  Diagnosis Date  . Ankle fracture 10/14/2017  . Heart murmur   . PNA (pneumonia) 02/16/2018   Past Surgical History:  Procedure Laterality Date  . APPENDECTOMY  1986  . EYE SURGERY Bilateral 1995 and 1999   Lens implants  . WRIST SURGERY  Left 1986   ORIF    Family History  Problem Relation Age of Onset  . Cancer Mother 31       Breast Cancer  . Heart disease Father 70       Congestive Heart Failure  . COPD Father 45       Emphysema  . Heart disease Brother 73       massive heart attack  . Heart disease Maternal Grandmother   . Heart disease Maternal Grandfather   . Early death Paternal Grandmother        Early death unsure age  . Heart disease Paternal Grandfather   . Obesity Paternal Grandfather    Social History   Socioeconomic History  . Marital status: Widowed    Spouse name: Not on file  . Number of children: Not on file  . Years of education: Not on file  . Highest education level: Not on file  Occupational History  . Not on file  Tobacco Use  . Smoking status: Never Smoker  . Smokeless tobacco: Never Used  Vaping Use  . Vaping Use: Never used  Substance and Sexual Activity  . Alcohol use: No    Alcohol/week: 0.0 standard drinks  . Drug use: No  . Sexual activity: Never  Other Topics Concern  . Not on file  Social History Narrative   Lives in [place]    Owns a [employer]    1 dog  Social Determinants of Health   Financial Resource Strain: Medium Risk  . Difficulty of Paying Living Expenses: Somewhat hard  Food Insecurity: No Food Insecurity  . Worried About Programme researcher, broadcasting/film/video in the Last Year: Never true  . Ran Out of Food in the Last Year: Never true  Transportation Needs: No Transportation Needs  . Lack of Transportation (Medical): No  . Lack of Transportation (Non-Medical): No  Physical Activity: Not on file  Stress: No Stress Concern Present  . Feeling of Stress : Not at all  Social Connections: Unknown  . Frequency of Communication with Friends and Family: More than three times a week  . Frequency of Social Gatherings with Friends and Family: More than three times a week  . Attends Religious Services: Not on file  . Active Member of Clubs or Organizations: Not on  file  . Attends Banker Meetings: Not on file  . Marital Status: Widowed    Tobacco Counseling Counseling given: Not Answered   Clinical Intake:  Pre-visit preparation completed: Yes        Diabetes: Yes (Followed by pcp) Nutrition Risk Assessment: Has the patient had any N/V/D within the last 2 months?  No  Does the patient have any non-healing wounds?  No  Has the patient had any unintentional weight loss or weight gain?  No   Diabetes: If diabetic, was a CBG obtained today?  No  Did the patient bring in their glucometer from home?  No  How often do you monitor your CBG's? Does not check at home.   Diabetes Management: Is the patient seen by Chronic Care Management for management of their diabetes?  Yes  Would the patient like to be referred to a Nutritionist?  No   Diabetic Exams: Diabetic Eye Exam: Completed 05/23/20 Diabetic Foot Exam: Completed 11/01/19   How often do you need to have someone help you when you read instructions, pamphlets, or other written materials from your doctor or pharmacy?: 1 - Never    Interpreter Needed?: No      Activities of Daily Living In your present state of health, do you have any difficulty performing the following activities: 07/10/2020  Hearing? N  Vision? N  Difficulty concentrating or making decisions? N  Walking or climbing stairs? Y  Comment Unsteady gait. Walker in use.  Dressing or bathing? N  Doing errands, shopping? Y  Comment Runs errands with daughter  Quarry manager and eating ? N  Using the Toilet? N  In the past six months, have you accidently leaked urine? N  Do you have problems with loss of bowel control? N  Managing your Medications? N  Managing your Finances? N  Housekeeping or managing your Housekeeping? N  Some recent data might be hidden    Patient Care Team: Glori Luis, MD as PCP - General (Family Medicine) Lourena Simmonds, RPH-CPP as Pharmacist  (Pharmacist)  Indicate any recent Medical Services you may have received from other than [facility] providers in the past year (date may be approximate).     Assessment:   This is a routine wellness examination for [place].  I connected with Laurie Patel today by telephone and verified that I am speaking with the correct person using two identifiers. Location patient: home Location provider: work Persons participating in the virtual visit: patient, Engineer, civil (consulting).    I discussed the limitations, risks, security and privacy concerns of performing an evaluation and management service by telephone and the availability of in  person appointments. The patient expressed understanding and verbally consented to this telephonic visit.    Interactive audio and video telecommunications were attempted between this provider and patient, however failed, due to patient having technical difficulties OR patient did not have access to video capability.  We continued and completed visit with audio only.  Some vital signs may be absent or patient reported.   Hearing/Vision screen  Hearing Screening   125Hz  250Hz  500Hz  1000Hz  2000Hz  3000Hz  4000Hz  6000Hz  8000Hz   Right ear:           Left ear:           Comments: Patient has some difficulty hearing conversational tones, R ear.  Patient does not wear hearing aids. Audiology deferred in the last year, per patient preference.    Vision Screening Comments: Followed [facility]  Wears corrective lenses  Cataract extraction, bilateral  Visual acuity not assessed per patient preference since they have regular follow up with the ophthalmologist  Dietary issues and exercise activities discussed:    Low carb diet Good water intake  Goal: Weight loss goal 141lb  Depression Screen PHQ 2/9 Scores 07/10/2020 03/14/2020 11/09/2019 07/08/2019 06/21/2019 07/02/2018 06/26/2017  PHQ - 2 Score 0 0 0 0 0 0 0  PHQ- 9 Score - - - - - - 0    Fall Risk Fall Risk  07/10/2020 03/14/2020  11/09/2019 07/08/2019 06/21/2019  Falls in the past year? 0 0 0 0 0  Number falls in past yr: 0 0 0 - -  Injury with Fall? 0 - - - -  Comment - - - - -  Risk for fall due to : Impaired balance/gait - - - -  Risk for fall due to: Comment Cane/walker in use - - - -  Follow up Falls evaluation completed Falls evaluation completed Falls evaluation completed Falls evaluation completed -    FALL RISK PREVENTION PERTAINING TO THE HOME: Handrails in use when climbing stairs? Yes Home free of loose throw rugs in walkways, pet beds, electrical cords, etc? Yes  Adequate lighting in your home to reduce risk of falls? Yes   ASSISTIVE DEVICES UTILIZED TO PREVENT FALLS: Life alert? No  Use of a cane, walker or w/c? Yes , walker/cane Grab bars in the bathroom? Yes  Shower chair or bench in shower? No  Elevated toilet seat or a handicapped toilet? Yes    TIMED UP AND GO: Was the test performed? No . Virtual visit.   Cognitive Function: Patient is alert and oriented x3.  Denies difficulty focusing, making decisions, memory loss.  Enjoys using the computer, creates new recipes, cross stitching and reading. MMSE/6CIT deferred. Normal by direct communication/observation.   MMSE - Mini Mental State Exam 06/26/2017  Orientation to time 5  Orientation to Place 5  Registration 3  Attention/ Calculation 5  Recall 3  Language- name 2 objects 2  Language- repeat 1  Language- follow 3 step command 3  Language- read & follow direction 1  Write a sentence 1  Copy design 1  Total score 30     6CIT Screen 07/08/2019 07/02/2018  What Year? 0 points 0 points  What month? 0 points 0 points  What time? 0 points 0 points  Count back from 20 0 points 0 points  Months in reverse 0 points 0 points  Repeat phrase 0 points 0 points  Total Score 0 0    Immunizations Immunization History  Administered Date(s) Administered  . Influenza Split 02/24/2013,  03/24/2014, 02/29/2016  . Influenza, High Dose Seasonal  PF 04/10/2017, 02/14/2018, 02/10/2019  . Influenza-Unspecified 04/07/2017, 02/10/2019, 03/07/2020  . PFIZER(Purple Top)SARS-COV-2 Vaccination 07/30/2019, 08/20/2019, 05/23/2020  . Pneumococcal Conjugate-13 02/10/2019  . Pneumococcal Polysaccharide-23 02/10/2019  . Pneumococcal-Unspecified 03/24/2014  . Td 02/08/2010  . Tdap 06/24/2008    TDAP status: Due, Education has been provided regarding the importance of this vaccine. Advised may receive this vaccine at local pharmacy or Health Dept. Aware to provide a copy of the vaccination record if obtained from local pharmacy or Health Dept. Verbalized acceptance and understanding. Deferred.   Health Maintenance Health Maintenance  Topic Date Due  . TETANUS/TDAP  07/10/2021 (Originally 02/09/2020)  . HEMOGLOBIN A1C  09/11/2020  . FOOT EXAM  10/31/2020  . OPHTHALMOLOGY EXAM  05/23/2021  . INFLUENZA VACCINE  Completed  . DEXA SCAN  Completed  . COVID-19 Vaccine  Completed  . PNA vac Low Risk Adult  Completed   Colorectal cancer screening: No longer required.   Mammogram- no longer required.   Bone density- 01/21/19. Osteoporosis.   Lung Cancer Screening: (Low Dose CT Chest recommended if Age 23-80 years, 30 pack-year currently smoking OR have quit w/in 15years.) does not qualify.   Hepatitis C Screening: does not qualify.  Vision Screening: Recommended annual ophthalmology exams for early detection of glaucoma and other disorders of the eye. Is the patient up to date with their annual eye exam?  Yes  Who is the provider or what is the name of the office in which the patient attends annual eye exams? [facility]  Dental Screening: Recommended annual dental exams for proper oral hygiene. Upper partial.   Community Resource Referral / Chronic Care Management: CRR required this visit?  No   CCM required this visit?  No      Plan:   Keep all routine maintenance appointments.   CCM 07/13/20 @ 4:15  Follow up 07/18/20 @  9:00  I have personally reviewed and noted the following in the patient's chart:   . Medical and social history . Use of alcohol, tobacco or illicit drugs  . Current medications and supplements . Functional ability and status . Nutritional status . Physical activity . Advanced directives . List of other physicians . Hospitalizations, surgeries, and ER visits in previous 12 months . Vitals . Screenings to include cognitive, depression, and falls . Referrals and appointments  In addition, I have reviewed and discussed with patient certain preventive protocols, quality metrics, and best practice recommendations. A written personalized care plan for preventive services as well as general preventive health recommendations were provided to patient via mychart.     Ashok Pall, LPN   3/38/2505

## 2020-07-10 NOTE — Chronic Care Management (AMB) (Signed)
Chronic Care Management Pharmacy Note  07/10/2020 Name:  Laurie Patel MRN:  [id number] DOB:  05-28-1928  Subjective: Laurie Patel is an 87 y.o. year old female who is a primary patient of Laurie Patel, Laurie Adam, MD.  The CCM team was consulted for assistance with disease management and care coordination needs.    Engaged with patient by telephone for follow up visit in response to provider referral for pharmacy case management and/or care coordination services.   Consent to Services:  The patient was given information about Chronic Care Management services, agreed to services, and gave verbal consent prior to initiation of services.  Please see initial visit note for detailed documentation.   Objective:  Lab Results  Component Value Date   CREATININE 0.58 04/09/2020   CREATININE 0.68 03/14/2020   CREATININE 0.73 07/13/2019    Lab Results  Component Value Date   HGBA1C 8.3 (H) 03/14/2020       Component Value Date/Time   CHOL 173 03/14/2020 0941   TRIG 117.0 03/14/2020 0941   HDL 36.20 (L) 03/14/2020 0941   CHOLHDL 5 03/14/2020 0941   VLDL 23.4 03/14/2020 0941   LDLCALC 114 (H) 03/14/2020 0941   LDLDIRECT 147.0 03/21/2016 0926    BP Readings from Last 3 Encounters:  04/25/20 130/70  04/18/20 140/60  04/09/20 (!) 179/68    Assessment: Review of patient past medical history, allergies, medications, health status, including review of consultants reports, laboratory and other test data, was performed as part of comprehensive evaluation and provision of chronic care management services.   SDOH:  (Social Determinants of Health) assessments and interventions performed:  SDOH Interventions   Flowsheet Row Most Recent Value  SDOH Interventions   Financial Strain Interventions Other (Comment)  [manufacturer assistance]      CCM Care Plan  Allergies  Allergen Reactions  . Augmentin [Amoxicillin-Pot Clavulanate] Nausea And Vomiting  . Prednisone     Medications  Reviewed Today    Reviewed by De Hollingshead, RPH-CPP (Pharmacist) on 07/10/20 at 1225  Med List Status: <None>  Medication Order Taking? Sig Documenting Provider Last Dose Status Informant  aspirin 81 MG EC tablet [id number] Yes Take 81 mg by mouth daily. Swallow whole. [provider] Taking Active Self  linagliptin (TRADJENTA) 5 MG TABS tablet [id number] Yes Take 1 tablet (5 mg total) by mouth daily. Leone Haven, MD Taking Active   lisinopril (ZESTRIL) 5 MG tablet [id number] Yes TAKE ONE TABLET BY MOUTH DAILY Leone Haven, MD Taking Active   Multiple Vitamins-Calcium (ONE-A-DAY WOMENS PO) [id number] Yes Take 1 tablet by mouth daily. [provider] Taking Active Self  polyethylene glycol powder (GLYCOLAX/MIRALAX) 17 GM/SCOOP powder [id number] Yes Take 17 g by mouth daily as needed for mild constipation. Leone Haven, MD Taking Active           Patient Active Problem List   Diagnosis Date Noted  . Constipation 04/18/2020  . Elevated hemoglobin ([place]) 04/18/2020  . Bilateral hand numbness 03/14/2020  . Skin lesions 11/09/2019  . Ear discomfort, right 05/12/2018  . Accumulation of fluid in tissues 02/16/2018  . Dermatitis, eczematoid 02/16/2018  . H/O: osteoarthritis 02/16/2018  . Irregular cardiac rhythm 02/16/2018  . Low back pain 02/16/2018  . Nonspecific abnormal finding in stool contents 02/16/2018  . Balance problem 10/14/2017  . Palpitations 06/21/2016  . Hyperlipidemia 03/21/2016  . Allergic rhinitis 03/21/2016  . Diabetes ([place]) 09/14/2015  . HTN (hypertension) 08/27/2014  . Breathlessness on exertion 10/05/2009  .  OP (osteoporosis) 09/04/2009    Conditions to be addressed/monitored: HTN and DMII  Care Plan : Medication Management  Updates made by De Hollingshead, RPH-CPP since 07/10/2020 12:00 AM  Completed 07/10/2020  Problem: Diabetes, Hypertension Resolved 07/10/2020    Long-Range Goal: Disease Progression Prevention  Completed 07/10/2020  This Visit's Progress: On track  Recent Progress: On track  Priority: High  Note:   Current Barriers:  . Unable to independently afford treatment regimen . Unable to achieve control of diabetes   Pharmacist Clinical Goal(s):  Marland Kitchen Over the next 90 days, patient will verbalize ability to afford treatment regimen. . Over the next 90 days, patient will achieve control of diabetes as evidenced by improvement in A1c through collaboration with PharmD and provider  Interventions: . 1:1 collaboration with Leone Haven, MD regarding development and update of comprehensive plan of care as evidenced by provider attestation and co-signature . Inter-disciplinary care team collaboration (see longitudinal plan of care) . Comprehensive medication review performed; medication list updated in electronic medical record   Diabetes: . Uncontrolled (though more relaxed goal of A1c <8% likely appropriate given age, comorbidities, hx intolerances); current treatment:  Tradjenta 5 mg daily- though reports that she is taking once weekly. Reports that the day she takes Tradjenta, she will have fasting readings ~180-190 that drop to 80 within 1 hour of taking the medication, resulting in blurred vision. Self-reduced to once weekly. Notes that she doesn't mind continuing once weekly.  o Hx metformin, unknown reason for discontinuation o Hx Jardiance, reports significant constipation after starting this medication . Notes that she had an eye exam and that the eye doctor "found no evidence of diabetes". . Reviewed that a more relaxed A1c goal is likely appropriate given patient's age, hx intolerances, and lack of diabetic related complications at this time. Discussed discontinuation of Tradjenta, as unsure if once daily administration is providing clinical benefit. Patient prefers to continue current administration at this time.  . Encouraged continued focus on dietary choices, like intake of high  fiber fruits and vegetables, carbohydrates in moderation.   Hypertension: . Controlled; current treatment: lisinopril 5 mg daily  . Patient notes home readings ~130/50s. Notes she isn't sure why she needs to be on this medication . Recommended to continue current regiment at this time . Discussed nephroprotective benefit of ACEi. Encouraged to monitor for episodes of hypotension.    Patient Goals/Self-Care Activities . Over the next 90 days, patient will:  - take medications as prescribed collaborate with provider on medication access solutions  Follow Up Plan:  No further follow up required: due to goals of care being met closing CCM case .      Medication Assistance: Tradjenta obtained through [place] medication assistance program.  Enrollment ends 06/23/21  Follow Up:  Patient requests no follow-up at this time.  Plan: No further follow up required: goals of care met.  [name] Darnelle Maffucci, PharmD, [place], [place] Clinical Pharmacist [employer] at [place]

## 2020-07-10 NOTE — Patient Instructions (Signed)
Visit Information  Patient Care Plan: Medication Management  Completed 07/10/2020  Problem Identified: Diabetes, Hypertension Resolved 07/10/2020    Long-Range Goal: Disease Progression Prevention Completed 07/10/2020  This Visit's Progress: On track  Recent Progress: On track  Priority: High  Note:   Current Barriers:  . Unable to independently afford treatment regimen . Unable to achieve control of diabetes   Pharmacist Clinical Goal(s):  Marland Kitchen Over the next 90 days, patient will verbalize ability to afford treatment regimen. . Over the next 90 days, patient will achieve control of diabetes as evidenced by improvement in A1c through collaboration with PharmD and provider  Interventions: . 1:1 collaboration with Leone Haven, MD regarding development and update of comprehensive plan of care as evidenced by provider attestation and co-signature . Inter-disciplinary care team collaboration (see longitudinal plan of care) . Comprehensive medication review performed; medication list updated in electronic medical record   Diabetes: . Uncontrolled (though more relaxed goal of A1c <8% likely appropriate given age, comorbidities, hx intolerances); current treatment:  Tradjenta 5 mg daily- though reports that she is taking once weekly. Reports that the day she takes Tradjenta, she will have fasting readings ~180-190 that drop to 80 within 1 hour of taking the medication, resulting in blurred vision. Self-reduced to once weekly. Notes that she doesn't mind continuing once weekly.  o Hx metformin, unknown reason for discontinuation o Hx Jardiance, reports significant constipation after starting this medication . Notes that she had an eye exam and that the eye doctor "found no evidence of diabetes". . Reviewed that a more relaxed A1c goal is likely appropriate given patient's age, hx intolerances, and lack of diabetic related complications at this time. Discussed discontinuation of Tradjenta, as  unsure if once daily administration is providing clinical benefit. Patient prefers to continue current administration at this time.  . Encouraged continued focus on dietary choices, like intake of high fiber fruits and vegetables, carbohydrates in moderation.   Hypertension: . Controlled; current treatment: lisinopril 5 mg daily  . Patient notes home readings ~130/50s. Notes she isn't sure why she needs to be on this medication . Recommended to continue current regiment at this time . Discussed nephroprotective benefit of ACEi. Encouraged to monitor for episodes of hypotension.    Patient Goals/Self-Care Activities . Over the next 90 days, patient will:  - take medications as prescribed collaborate with provider on medication access solutions  Follow Up Plan:  No further follow up required: due to goals of care being met closing CCM case .      The patient verbalized understanding of instructions, educational materials, and care plan provided today and declined offer to receive copy of patient instructions, educational materials, and care plan.   Plan: No further follow up required: goals of care met.  [name] Darnelle Maffucci, PharmD, [place], [place] Clinical Pharmacist [employer] at [place]

## 2020-07-10 NOTE — Patient Instructions (Addendum)
Laurie Patel , Thank you for taking time to come for your Medicare Wellness Visit. I appreciate your ongoing commitment to your health goals. Please review the following plan we discussed and let me know if I can assist you in the future.   These are the goals we discussed:  Goal: Weight loss goal 141lb  This is a list of the screening recommended for you and due dates:  Health Maintenance  Topic Date Due  . Tetanus Vaccine  07/10/2021*  . Hemoglobin A1C  09/11/2020  . Complete foot exam   10/31/2020  . Eye exam for diabetics  05/23/2021  . Flu Shot  Completed  . DEXA scan (bone density measurement)  Completed  . COVID-19 Vaccine  Completed  . Pneumonia vaccines  Completed  *Topic was postponed. The date shown is not the original due date.    Immunizations Immunization History  Administered Date(s) Administered  . Influenza Split 02/24/2013, 03/24/2014, 02/29/2016  . Influenza, High Dose Seasonal PF 04/10/2017, 02/14/2018, 02/10/2019  . Influenza-Unspecified 04/07/2017, 02/10/2019, 03/07/2020  . PFIZER(Purple Top)SARS-COV-2 Vaccination 07/30/2019, 08/20/2019, 05/23/2020  . Pneumococcal Conjugate-13 02/10/2019  . Pneumococcal Polysaccharide-23 02/10/2019  . Pneumococcal-Unspecified 03/24/2014  . Td 02/08/2010  . Tdap 06/24/2008   Advanced directives: End of life planning; Advance aging; Advanced directives discussed.  Copy of current HCPOA/Living Will requested.    Conditions/risks identified: None new.    Follow up in one year for your annual wellness visit.   Preventive Care 87 Years and Older, Female Preventive care refers to lifestyle choices and visits with your health care provider that can promote health and wellness. What does preventive care include?  A yearly physical exam. This is also called an annual well check.  Dental exams once or twice a year.  Routine eye exams. Ask your health care provider how often you should have your [place] checked.  Personal  lifestyle choices, including:  Daily care of your teeth and gums.  Regular physical activity.  Eating a healthy diet.  Avoiding tobacco and drug use.  Limiting alcohol use.  Practicing safe sex.  Taking low-dose aspirin every day.  Taking vitamin and mineral supplements as recommended by your health care provider. What happens during an annual well check? The services and screenings done by your health care provider during your annual well check will depend on your age, overall health, lifestyle risk factors, and family history of disease. Counseling  Your health care provider may ask you questions about your:  Alcohol use.  Tobacco use.  Drug use.  Emotional well-being.  Home and relationship well-being.  Sexual activity.  Eating habits.  History of falls.  Memory and ability to understand (cognition).  Work and work Astronomer.  Reproductive health. Screening  You may have the following tests or measurements:  Height, weight, and BMI.  Blood pressure.  Lipid and cholesterol levels. These may be checked every 5 years, or more frequently if you are over 73 years old.  Skin check.  Lung cancer screening. You may have this screening every year starting at age 60 if you have a 30-pack-year history of smoking and currently smoke or have quit within the past 15 years.  Fecal occult blood test (FOBT) of the stool. You may have this test every year starting at age 74.  Flexible sigmoidoscopy or colonoscopy. You may have a sigmoidoscopy every 5 years or a colonoscopy every 10 years starting at age 60.  Hepatitis C blood test.  Hepatitis B blood test.  Sexually transmitted  disease (STD) testing.  Diabetes screening. This is done by checking your blood sugar (glucose) after you have not eaten for a while (fasting). You may have this done every 1-3 years.  Bone density scan. This is done to screen for osteoporosis. You may have this done starting at age  83.  Mammogram. This may be done every 1-2 years. Talk to your health care provider about how often you should have regular mammograms. Talk with your health care provider about your test results, treatment options, and if necessary, the need for more tests. Vaccines  Your health care provider may recommend certain vaccines, such as:  Influenza vaccine. This is recommended every year.  Tetanus, diphtheria, and acellular pertussis (Tdap, Td) vaccine. You may need a Td booster every 10 years.  Zoster vaccine. You may need this after age 17.  Pneumococcal 13-valent conjugate (PCV13) vaccine. One dose is recommended after age 55.  Pneumococcal polysaccharide (PPSV23) vaccine. One dose is recommended after age 67. Talk to your health care provider about which screenings and vaccines you need and how often you need them. This information is not intended to replace advice given to you by your health care provider. Make sure you discuss any questions you have with your health care provider. Document Released: 07/07/2015 Document Revised: 02/28/2016 Document Reviewed: 04/11/2015 Elsevier Interactive Patient Education  2017 [place] Prevention in the Home Falls can cause injuries. They can happen to people of all ages. There are many things you can do to make your home safe and to help prevent falls. What can I do on the outside of my home?  Regularly fix the edges of walkways and driveways and fix any cracks.  Remove anything that might make you trip as you walk through a door, such as a raised step or threshold.  Trim any bushes or trees on the path to your home.  Use bright outdoor lighting.  Clear any walking paths of anything that might make someone trip, such as rocks or tools.  Regularly check to see if handrails are loose or broken. Make sure that both sides of any steps have handrails.  Any raised decks and porches should have guardrails on the edges.  Have any  leaves, snow, or ice cleared regularly.  Use sand or salt on walking paths during winter.  Clean up any spills in your garage right away. This includes oil or grease spills. What can I do in the bathroom?  Use night lights.  Install grab bars by the toilet and in the tub and shower. Do not use towel bars as grab bars.  Use non-skid mats or decals in the tub or shower.  If you need to sit down in the shower, use a plastic, non-slip stool.  Keep the floor dry. Clean up any water that spills on the floor as soon as it happens.  Remove soap buildup in the tub or shower regularly.  Attach bath mats securely with double-sided non-slip rug tape.  Do not have throw rugs and other things on the floor that can make you trip. What can I do in the bedroom?  Use night lights.  Make sure that you have a light by your bed that is easy to reach.  Do not use any sheets or blankets that are too big for your bed. They should not hang down onto the floor.  Have a firm chair that has side arms. You can use this for support while you get dressed.  Do not have throw rugs and other things on the floor that can make you trip. What can I do in the kitchen?  Clean up any spills right away.  Avoid walking on wet floors.  Keep items that you use a lot in easy-to-reach places.  If you need to reach something above you, use a strong step stool that has a grab bar.  Keep electrical cords out of the way.  Do not use floor polish or wax that makes floors slippery. If you must use wax, use non-skid floor wax.  Do not have throw rugs and other things on the floor that can make you trip. What can I do with my stairs?  Do not leave any items on the stairs.  Make sure that there are handrails on both sides of the stairs and use them. Fix handrails that are broken or loose. Make sure that handrails are as long as the stairways.  Check any carpeting to make sure that it is firmly attached to the stairs.  Fix any carpet that is loose or worn.  Avoid having throw rugs at the top or bottom of the stairs. If you do have throw rugs, attach them to the floor with carpet tape.  Make sure that you have a light switch at the top of the stairs and the bottom of the stairs. If you do not have them, ask someone to add them for you. What else can I do to help prevent falls?  Wear shoes that:  Do not have high heels.  Have rubber bottoms.  Are comfortable and fit you well.  Are closed at the toe. Do not wear sandals.  If you use a stepladder:  Make sure that it is fully opened. Do not climb a closed stepladder.  Make sure that both sides of the stepladder are locked into place.  Ask someone to hold it for you, if possible.  Clearly mark and make sure that you can see:  Any grab bars or handrails.  First and last steps.  Where the edge of each step is.  Use tools that help you move around (mobility aids) if they are needed. These include:  Canes.  Walkers.  Scooters.  Crutches.  Turn on the lights when you go into a dark area. Replace any light bulbs as soon as they burn out.  Set up your furniture so you have a clear path. Avoid moving your furniture around.  If any of your floors are uneven, fix them.  If there are any pets around you, be aware of where they are.  Review your medicines with your doctor. Some medicines can make you feel dizzy. This can increase your chance of falling. Ask your doctor what other things that you can do to help prevent falls. This information is not intended to replace advice given to you by your health care provider. Make sure you discuss any questions you have with your health care provider. Document Released: 04/06/2009 Document Revised: 11/16/2015 Document Reviewed: 07/15/2014 Elsevier Interactive Patient Education  2017 [employer].

## 2020-07-13 ENCOUNTER — Telehealth: Payer: Medicare Other

## 2020-07-18 ENCOUNTER — Ambulatory Visit: Payer: Medicare Other | Admitting: Family Medicine

## 2020-07-20 ENCOUNTER — Encounter: Payer: Self-pay | Admitting: Family Medicine

## 2020-07-20 ENCOUNTER — Telehealth (INDEPENDENT_AMBULATORY_CARE_PROVIDER_SITE_OTHER): Payer: Medicare Other | Admitting: Family Medicine

## 2020-07-20 DIAGNOSIS — E119 Type 2 diabetes mellitus without complications: Secondary | ICD-10-CM | POA: Diagnosis not present

## 2020-07-20 DIAGNOSIS — I10 Essential (primary) hypertension: Secondary | ICD-10-CM | POA: Diagnosis not present

## 2020-07-20 MED ORDER — LINAGLIPTIN 5 MG PO TABS
5.0000 mg | ORAL_TABLET | ORAL | 3 refills | Status: DC
Start: 2020-07-20 — End: 2021-07-24

## 2020-07-20 NOTE — Assessment & Plan Note (Signed)
Previously poorly controlled though given her age and medication intolerance we are allowing for a slightly more relaxed goal A1c. She will come in for an A1c and then we will determine if she should increase the frequency of her Tradjenta. For now she will continue Tradjenta 5 mg once weekly at her request.

## 2020-07-20 NOTE — Assessment & Plan Note (Signed)
Very well controlled. She will continue lisinopril 5 mg once daily.

## 2020-07-20 NOTE — Progress Notes (Signed)
Virtual Visit via telephone Note  This visit type was conducted due to national recommendations for restrictions regarding the COVID-19 pandemic (e.g. social distancing).  This format is felt to be most appropriate for this patient at this time.  All issues noted in this document were discussed and addressed.  No physical exam was performed (except for noted visual exam findings with Video Visits).   I connected with Laurie Patel today at  8:30 AM EST by a video enabled telemedicine application or telephone and verified that I am speaking with the correct person using two identifiers. Location patient: home Location provider: home office Persons participating in the virtual visit: patient, provider  I discussed the limitations, risks, security and privacy concerns of performing an evaluation and management service by telephone and the availability of in person appointments. I also discussed with the patient that there may be a patient responsible charge related to this service. The patient expressed understanding and agreed to proceed.  Interactive audio and video telecommunications were attempted between this provider and patient, however failed, due to patient having technical difficulties OR patient did not have access to video capability.  We continued and completed visit with audio only.   Reason for visit: f/u  HPI: HYPERTENSION  Disease Monitoring  Home BP Monitoring 130/50 typically Chest pain- no    Dyspnea- no Medications  Compliance-  Taking lisinopril. Lightheadedness-  no  Edema- no  DIABETES Disease Monitoring: Blood Sugar ranges-130 typically Polyuria/phagia/dipsia- no      Optho- UTD Medications: Compliance- taking tradjenta once weekly, noted daily dosing made her gassy Hypoglycemic symptoms- notes feeling cold and off balance when glucose is around 80    ROS: See pertinent positives and negatives per HPI.  Past Medical History:  Diagnosis Date  . Ankle fracture  10/14/2017  . Heart murmur   . PNA (pneumonia) 02/16/2018    Past Surgical History:  Procedure Laterality Date  . APPENDECTOMY  1986  . EYE SURGERY Bilateral 1995 and 1999   Lens implants  . WRIST SURGERY Left 1986   ORIF     Family History  Problem Relation Age of Onset  . Cancer Mother 52       Breast Cancer  . Heart disease Father 1       Congestive Heart Failure  . COPD Father 45       Emphysema  . Heart disease Brother 61       massive heart attack  . Heart disease Maternal Grandmother   . Heart disease Maternal Grandfather   . Early death Paternal Grandmother        Early death unsure age  . Heart disease Paternal Grandfather   . Obesity Paternal Grandfather     SOCIAL HX: Non-smoker   Current Outpatient Medications:  .  aspirin 81 MG EC tablet, Take 81 mg by mouth daily. Swallow whole., Disp: , Rfl:  .  lisinopril (ZESTRIL) 5 MG tablet, TAKE ONE TABLET BY MOUTH DAILY, Disp: 90 tablet, Rfl: 2 .  Multiple Vitamins-Calcium (ONE-A-DAY WOMENS PO), Take 1 tablet by mouth daily., Disp: , Rfl:  .  polyethylene glycol powder (GLYCOLAX/MIRALAX) 17 GM/SCOOP powder, Take 17 g by mouth daily as needed for mild constipation., Disp: 500 g, Rfl: 0 .  linagliptin (TRADJENTA) 5 MG TABS tablet, Take 1 tablet (5 mg total) by mouth once a week., Disp: 13 tablet, Rfl: 3  EXAM: This was a telephone visit and thus no physical exam was completed.  ASSESSMENT AND PLAN:  Discussed the following assessment and plan:  Problem List Items Addressed This Visit    Diabetes [facility])    Previously poorly controlled though given her age and medication intolerance we are allowing for a slightly more relaxed goal A1c. She will come in for an A1c and then we will determine if she should increase the frequency of her Tradjenta. For now she will continue Tradjenta 5 mg once weekly at her request.      Relevant Medications   linagliptin (TRADJENTA) 5 MG TABS tablet   Other Relevant Orders   POCT  HgB A1C   HTN (hypertension)    Very well controlled. She will continue lisinopril 5 mg once daily.          I discussed the assessment and treatment plan with the patient. The patient was provided an opportunity to ask questions and all were answered. The patient agreed with the plan and demonstrated an understanding of the instructions.   The patient was advised to call back or seek an in-person evaluation if the symptoms worsen or if the condition fails to improve as anticipated.  I provided 11 minutes of non-face-to-face time during this encounter.   Marikay Alar, MD

## 2020-07-21 ENCOUNTER — Ambulatory Visit (INDEPENDENT_AMBULATORY_CARE_PROVIDER_SITE_OTHER): Payer: Medicare Other

## 2020-07-21 ENCOUNTER — Other Ambulatory Visit: Payer: Self-pay

## 2020-07-21 DIAGNOSIS — E119 Type 2 diabetes mellitus without complications: Secondary | ICD-10-CM

## 2020-07-21 LAB — POCT GLYCOSYLATED HEMOGLOBIN (HGB A1C): Hemoglobin A1C: 7.2 % — AB (ref 4.0–5.6)

## 2020-07-21 NOTE — Progress Notes (Signed)
Patient presented for POCT A1C. patient voiced no concerns nor showed any signs of distress during finger stick.

## 2020-09-04 ENCOUNTER — Telehealth: Payer: Self-pay | Admitting: Family Medicine

## 2020-09-04 NOTE — Telephone Encounter (Signed)
Please call patient and confirm. I was under the impression that she no longer wanted to pay the cash price for [place], especially since her last A1c was at our goal. Her insurance is not going to cover the [place] as she is not on insulin.  If she wants refills, confirm whether she has a Therapist, art 14 day or [place] 2 reader. If I remember correctly, she had the [place] 14 day reader so would need Libre 14 day sensors.   Thanks!

## 2020-09-04 NOTE — Telephone Encounter (Signed)
Please advise, does Patient still need this? Was discontinued.

## 2020-09-04 NOTE — Telephone Encounter (Signed)
Patient is requesting a refill for her Laurie Patel. Patient's pharmacy told her she needed a new prescribtion. Patient is out.

## 2020-09-06 NOTE — Telephone Encounter (Signed)
Called and informed the Patient of the below. She states that if it is not covered she does not want it

## 2020-09-14 ENCOUNTER — Other Ambulatory Visit: Payer: Self-pay

## 2020-09-14 ENCOUNTER — Ambulatory Visit: Payer: Medicare Other | Admitting: Podiatry

## 2020-09-14 ENCOUNTER — Encounter: Payer: Self-pay | Admitting: Podiatry

## 2020-09-14 DIAGNOSIS — B351 Tinea unguium: Secondary | ICD-10-CM

## 2020-09-14 DIAGNOSIS — E119 Type 2 diabetes mellitus without complications: Secondary | ICD-10-CM | POA: Diagnosis not present

## 2020-09-14 DIAGNOSIS — M79676 Pain in unspecified toe(s): Secondary | ICD-10-CM | POA: Diagnosis not present

## 2020-09-14 NOTE — Progress Notes (Signed)
This patient returns to my office for at risk foot care.  This patient requires this care by a professional since this patient will be at risk due to having diabetes.   This patient is unable to cut nails herself since the patient cannot reach her nails.These nails are painful walking and wearing shoes.  This patient presents for at risk foot care today.  General Appearance  Alert, conversant and in no acute stress.  Vascular  Dorsalis pedis and posterior tibial  pulses are weakly  palpable  bilaterally.  Capillary return is within normal limits  bilaterally. Temperature is within normal limits  bilaterally.  Neurologic  Senn-Weinstein monofilament wire test within normal limits  bilaterally. Muscle power within normal limits bilaterally.  Nails Thick disfigured discolored nails with subungual debris  from hallux to fifth toenails left and 2-5 toenails right..  Absence of right hallux nail. No evidence of bacterial infection or drainage bilaterally.  Orthopedic  No limitations of motion  feet .  No crepitus or effusions noted.  No bony pathology or digital deformities noted.  Skin  normotropic skin with no porokeratosis noted bilaterally.  No signs of infections or ulcers noted.     Onychomycosis  Pain in right toes  Pain in left toes  Consent was obtained for treatment procedures.   Mechanical debridement of nails 1-5  Left and 2-5 right  performed with a nail nipper with the exception right hallux toenail..  Filed with dremel without incident.     Return office visit  10 weeks                    Told patient to return for periodic foot care and evaluation due to potential at risk complications.   Ingrid Shifrin DPM  

## 2020-11-27 ENCOUNTER — Other Ambulatory Visit: Payer: Self-pay

## 2020-11-27 ENCOUNTER — Encounter: Payer: Self-pay | Admitting: Podiatry

## 2020-11-27 ENCOUNTER — Ambulatory Visit: Payer: Medicare Other | Admitting: Podiatry

## 2020-11-27 DIAGNOSIS — M79676 Pain in unspecified toe(s): Secondary | ICD-10-CM

## 2020-11-27 DIAGNOSIS — E119 Type 2 diabetes mellitus without complications: Secondary | ICD-10-CM | POA: Diagnosis not present

## 2020-11-27 DIAGNOSIS — B351 Tinea unguium: Secondary | ICD-10-CM | POA: Diagnosis not present

## 2020-11-27 NOTE — Progress Notes (Signed)
This patient returns to my office for at risk foot care.  This patient requires this care by a professional since this patient will be at risk due to having diabetes.   This patient is unable to cut nails herself since the patient cannot reach her nails.These nails are painful walking and wearing shoes.  This patient presents for at risk foot care today.  General Appearance  Alert, conversant and in no acute stress.  Vascular  Dorsalis pedis and posterior tibial  pulses are weakly  palpable  bilaterally.  Capillary return is within normal limits  bilaterally. Temperature is within normal limits  bilaterally.  Neurologic  Senn-Weinstein monofilament wire test within normal limits  bilaterally. Muscle power within normal limits bilaterally.  Nails Thick disfigured discolored nails with subungual debris  from hallux to fifth toenails left and 2-5 toenails right..  Absence of right hallux nail. No evidence of bacterial infection or drainage bilaterally.  Orthopedic  No limitations of motion  feet .  No crepitus or effusions noted.  No bony pathology or digital deformities noted.  Skin  normotropic skin with no porokeratosis noted bilaterally.  No signs of infections or ulcers noted.     Onychomycosis  Pain in right toes  Pain in left toes  Consent was obtained for treatment procedures.   Mechanical debridement of nails 1-5  Left and 2-5 right  performed with a nail nipper with the exception right hallux toenail..  Filed with dremel without incident.     Return office visit  10 weeks                    Told patient to return for periodic foot care and evaluation due to potential at risk complications.   Maudean Hoffmann DPM  

## 2021-01-02 ENCOUNTER — Other Ambulatory Visit: Payer: Self-pay

## 2021-01-02 ENCOUNTER — Encounter: Payer: Self-pay | Admitting: Family Medicine

## 2021-01-02 ENCOUNTER — Ambulatory Visit (INDEPENDENT_AMBULATORY_CARE_PROVIDER_SITE_OTHER): Payer: Medicare Other | Admitting: Family Medicine

## 2021-01-02 DIAGNOSIS — I10 Essential (primary) hypertension: Secondary | ICD-10-CM

## 2021-01-02 DIAGNOSIS — R2689 Other abnormalities of gait and mobility: Secondary | ICD-10-CM | POA: Diagnosis not present

## 2021-01-02 DIAGNOSIS — E119 Type 2 diabetes mellitus without complications: Secondary | ICD-10-CM | POA: Diagnosis not present

## 2021-01-02 LAB — BASIC METABOLIC PANEL
BUN: 10 mg/dL (ref 6–23)
CO2: 30 mEq/L (ref 19–32)
Calcium: 10.2 mg/dL (ref 8.4–10.5)
Chloride: 101 mEq/L (ref 96–112)
Creatinine, Ser: 0.61 mg/dL (ref 0.40–1.20)
GFR: 78.2 mL/min (ref >60.00)
Glucose, Bld: 132 mg/dL — ABNORMAL HIGH (ref 70–99)
Potassium: 3.9 mEq/L (ref 3.5–5.1)
Sodium: 138 mEq/L (ref 135–145)

## 2021-01-02 LAB — HEMOGLOBIN A1C: Hgb A1c MFr Bld: 7.8 % — ABNORMAL HIGH (ref 4.6–6.5)

## 2021-01-02 NOTE — Assessment & Plan Note (Signed)
Chronic issue.  She has a benign exam.  I encouraged her to continue using her Rollator.  I offered a physical therapy referral and noted that this would likely be beneficial for her but she declines this at this time.  She will let us know if she changes her mind.

## 2021-01-02 NOTE — Patient Instructions (Signed)
Nice to see you. We will get lab work today and contact you with the results. If you change your mind regarding seeing physical therapy please let me know.

## 2021-01-02 NOTE — Progress Notes (Signed)
Marikay Alar, MD Phone: [phone]  Laurie Patel is a 87 y.o. female who presents today for f/u.  HYPERTENSION Disease Monitoring Home BP Monitoring 130s/50s-60s Chest pain- no    Dyspnea- no Medications Compliance-  taking lisinopril.   Edema- no  DIABETES Disease Monitoring: Blood Sugar ranges-not checking Polyuria/phagia/dipsia- no      Optho- UTD Medications: Compliance- taking tradjenta Hypoglycemic symptoms- no  Balance difficulty: Patient notes this continues to be an issue.  It may be slightly worse than it has been in the past.  When she bends over it feels like she is going to tip over.  She notes no vertiginous symptoms.  No lightheadedness.  No loss of consciousness.  She does use a Rollator.    Social History   Tobacco Use  Smoking Status Never  Smokeless Tobacco Never    Current Outpatient Medications on File Prior to Visit  Medication Sig Dispense Refill   aspirin 81 MG EC tablet Take 81 mg by mouth daily. Swallow whole.     linagliptin (TRADJENTA) 5 MG TABS tablet Take 1 tablet (5 mg total) by mouth once a week. 13 tablet 3   lisinopril (ZESTRIL) 5 MG tablet TAKE ONE TABLET BY MOUTH DAILY 90 tablet 2   Multiple Vitamins-Calcium (ONE-A-DAY WOMENS PO) Take 1 tablet by mouth daily.     polyethylene glycol powder (GLYCOLAX/MIRALAX) 17 GM/SCOOP powder Take 17 g by mouth daily as needed for mild constipation. (Patient not taking: Reported on 01/02/2021) 500 g 0   No current facility-administered medications on file prior to visit.     ROS see history of present illness  Objective  Physical Exam Vitals:   01/02/21 1128 01/02/21 1146  BP: (!) 160/80 (!) 150/70  Pulse: 90   Temp: 98.5 F (36.9 C)   SpO2: 94%     BP Readings from Last 3 Encounters:  01/02/21 (!) 150/70  07/20/20 (!) 130/50  04/25/20 130/70   Wt Readings from Last 3 Encounters:  01/02/21 147 lb 3.2 oz (66.8 kg)  07/20/20 141 lb (64 kg)  07/10/20 144 lb (65.3 kg)     Physical Exam Constitutional:      General: She is not in acute distress.    Appearance: She is not diaphoretic.  Cardiovascular:     Rate and Rhythm: Normal rate and regular rhythm.     Heart sounds: Normal heart sounds.  Pulmonary:     Effort: Pulmonary effort is normal.     Breath sounds: Normal breath sounds.  Skin:    General: Skin is warm and dry.  Neurological:     Mental Status: She is alert.     Comments: 5/5 strength in bilateral biceps, triceps, grip, quads, hamstrings, plantar and dorsiflexion, sensation to light touch intact in bilateral UE and LE, negative Romberg, no pronator drift, normal finger-nose, normal rapid alternating movements     Assessment/Plan: Please see individual problem list.  Problem List Items Addressed This Visit     Balance problem    Chronic issue.  She has a benign exam.  I encouraged her to continue using her Rollator.  I offered a physical therapy referral and noted that this would likely be beneficial for her but she declines this at this time.  She will let us know if she changes her mind.       Diabetes (HCC)    Check A1c.  Discussed the potential for continuing with Tradjenta.  Discussed that her insurance would not cover freestyle libre though  she could pay out-of-pocket if needed.       Relevant Orders   HgB A1c   HTN (hypertension)    Adequate control at home.  She will continue lisinopril 5 mg once daily.       Relevant Orders   Basic Metabolic Panel (BMET)    Return in about 6 months (around 07/05/2021) for diabetes, HTN.  This visit occurred during the SARS-CoV-2 public health emergency.  Safety protocols were in place, including screening questions prior to the visit, additional usage of staff PPE, and extensive cleaning of exam room while observing appropriate contact time as indicated for disinfecting solutions.    Marikay Alar, MD [facility]

## 2021-01-02 NOTE — Assessment & Plan Note (Signed)
Check A1c.  Discussed the potential for continuing with Tradjenta.  Discussed that her insurance would not cover freestyle Josephine Igo though she could pay out-of-pocket if needed.

## 2021-01-02 NOTE — Assessment & Plan Note (Signed)
Adequate control at home.  She will continue lisinopril 5 mg once daily.

## 2021-01-04 ENCOUNTER — Other Ambulatory Visit: Payer: Self-pay | Admitting: Family Medicine

## 2021-01-09 ENCOUNTER — Telehealth: Payer: Self-pay | Admitting: Family Medicine

## 2021-01-09 NOTE — Telephone Encounter (Signed)
PT would like to know if she needs to continue taking the linagliptin (TRADJENTA) 5 MG TABS tablet. PT would like to know before she sends in the card for another 90 day.

## 2021-01-10 NOTE — Telephone Encounter (Signed)
I would suggest she continue on the tradjenta.

## 2021-01-11 NOTE — Telephone Encounter (Signed)
I called the patient and informed her that the provider wants her to continue the Tr[place] and she understood.  Laurie Patel,cma

## 2021-01-13 ENCOUNTER — Other Ambulatory Visit: Payer: Self-pay | Admitting: Family Medicine

## 2021-01-13 DIAGNOSIS — I10 Essential (primary) hypertension: Secondary | ICD-10-CM

## 2021-01-15 ENCOUNTER — Telehealth: Payer: Self-pay | Admitting: Family Medicine

## 2021-01-15 NOTE — Telephone Encounter (Signed)
Patient called to remind Dr [name] to call the [place] 14 day kit.

## 2021-01-16 ENCOUNTER — Ambulatory Visit: Payer: Medicare Other | Admitting: Family Medicine

## 2021-01-19 MED ORDER — FREESTYLE LIBRE 14 DAY READER DEVI: 0 refills | Status: DC

## 2021-01-19 MED ORDER — FREESTYLE LIBRE 14 DAY SENSOR MISC: 1 refills | Status: DC

## 2021-01-19 NOTE — Addendum Note (Signed)
Addended by: Glori Luis on: 01/19/2021 09:12 AM   Modules accepted: Orders

## 2021-01-19 NOTE — Telephone Encounter (Signed)
Sent to pharmacy 

## 2021-01-30 ENCOUNTER — Emergency Department: Payer: Medicare Other

## 2021-01-30 ENCOUNTER — Observation Stay
Admission: EM | Admit: 2021-01-30 | Discharge: 2021-01-31 | Disposition: A | Discharge status: Home / Self Care | Payer: Medicare Other | Attending: Internal Medicine | Admitting: Internal Medicine

## 2021-01-30 ENCOUNTER — Encounter: Payer: Self-pay | Admitting: Emergency Medicine

## 2021-01-30 ENCOUNTER — Observation Stay (HOSPITAL_BASED_OUTPATIENT_CLINIC_OR_DEPARTMENT_OTHER)
Admit: 2021-01-30 | Discharge: 2021-01-30 | Disposition: A | Discharge status: Home / Self Care | Payer: Medicare Other | Attending: Internal Medicine | Admitting: Internal Medicine

## 2021-01-30 ENCOUNTER — Other Ambulatory Visit: Payer: Self-pay

## 2021-01-30 ENCOUNTER — Ambulatory Visit
Admission: EM | Admit: 2021-01-30 | Discharge: 2021-01-30 | Payer: Medicare Other | Attending: Emergency Medicine | Admitting: Emergency Medicine

## 2021-01-30 DIAGNOSIS — I10 Essential (primary) hypertension: Secondary | ICD-10-CM | POA: Insufficient documentation

## 2021-01-30 DIAGNOSIS — R079 Chest pain, unspecified: Secondary | ICD-10-CM | POA: Diagnosis not present

## 2021-01-30 DIAGNOSIS — R0602 Shortness of breath: Secondary | ICD-10-CM | POA: Diagnosis not present

## 2021-01-30 DIAGNOSIS — F32A Depression, unspecified: Secondary | ICD-10-CM | POA: Diagnosis not present

## 2021-01-30 DIAGNOSIS — Z743 Need for continuous supervision: Secondary | ICD-10-CM | POA: Diagnosis not present

## 2021-01-30 DIAGNOSIS — I161 Hypertensive emergency: Secondary | ICD-10-CM | POA: Insufficient documentation

## 2021-01-30 DIAGNOSIS — R0789 Other chest pain: Secondary | ICD-10-CM | POA: Diagnosis not present

## 2021-01-30 DIAGNOSIS — Z79899 Other long term (current) drug therapy: Secondary | ICD-10-CM | POA: Insufficient documentation

## 2021-01-30 DIAGNOSIS — E1165 Type 2 diabetes mellitus with hyperglycemia: Secondary | ICD-10-CM | POA: Diagnosis not present

## 2021-01-30 DIAGNOSIS — Z7982 Long term (current) use of aspirin: Secondary | ICD-10-CM | POA: Diagnosis not present

## 2021-01-30 DIAGNOSIS — R200 Anesthesia of skin: Secondary | ICD-10-CM

## 2021-01-30 DIAGNOSIS — R404 Transient alteration of awareness: Secondary | ICD-10-CM | POA: Diagnosis not present

## 2021-01-30 DIAGNOSIS — R42 Dizziness and giddiness: Secondary | ICD-10-CM | POA: Diagnosis not present

## 2021-01-30 DIAGNOSIS — Z20822 Contact with and (suspected) exposure to covid-19: Secondary | ICD-10-CM | POA: Diagnosis not present

## 2021-01-30 DIAGNOSIS — R6889 Other general symptoms and signs: Secondary | ICD-10-CM | POA: Diagnosis not present

## 2021-01-30 DIAGNOSIS — I447 Left bundle-branch block, unspecified: Secondary | ICD-10-CM | POA: Diagnosis not present

## 2021-01-30 LAB — CREATININE, SERUM
Creatinine, Ser: 0.55 mg/dL (ref 0.44–1.00)
GFR, Estimated: >60 mL/min (ref >60)

## 2021-01-30 LAB — CBC
HCT: 49.1 % — ABNORMAL HIGH (ref 36.0–46.0)
HCT: 47.9 % — ABNORMAL HIGH (ref 36.0–46.0)
Hemoglobin: 16 g/dL — ABNORMAL HIGH (ref 12.0–15.0)
Hemoglobin: 15.6 g/dL — ABNORMAL HIGH (ref 12.0–15.0)
MCH: 27 pg (ref 26.0–34.0)
MCH: 26.9 pg (ref 26.0–34.0)
MCHC: 32.6 g/dL (ref 30.0–36.0)
MCHC: 32.6 g/dL (ref 30.0–36.0)
MCV: 82.8 fL (ref 80.0–100.0)
MCV: 82.4 fL (ref 80.0–100.0)
Platelets: 203 10*3/uL (ref 150–400)
Platelets: 213 10*3/uL (ref 150–400)
RBC: 5.93 MIL/uL — ABNORMAL HIGH (ref 3.87–5.11)
RBC: 5.81 MIL/uL — ABNORMAL HIGH (ref 3.87–5.11)
RDW: 13.6 % (ref 11.5–15.5)
RDW: 13.7 % (ref 11.5–15.5)
WBC: 9.5 10*3/uL (ref 4.0–10.5)
WBC: 9.8 10*3/uL (ref 4.0–10.5)
nRBC: 0 % (ref 0.0–0.2)
nRBC: 0 % (ref 0.0–0.2)

## 2021-01-30 LAB — BASIC METABOLIC PANEL
Anion gap: 10 (ref 5–15)
BUN: 13 mg/dL (ref 8–23)
CO2: 30 mmol/L (ref 22–32)
Calcium: 9.9 mg/dL (ref 8.9–10.3)
Chloride: 100 mmol/L (ref 98–111)
Creatinine, Ser: 0.7 mg/dL (ref 0.44–1.00)
GFR, Estimated: >60 mL/min (ref >60)
Glucose, Bld: 141 mg/dL — ABNORMAL HIGH (ref 70–99)
Potassium: 4.1 mmol/L (ref 3.5–5.1)
Sodium: 140 mmol/L (ref 135–145)

## 2021-01-30 LAB — POCT FASTING CBG KUC MANUAL ENTRY: POCT Glucose (KUC): 207 mg/dL — AB (ref 70–99)

## 2021-01-30 LAB — CBG MONITORING, ED: Glucose-Capillary: 208 mg/dL — ABNORMAL HIGH (ref 70–99)

## 2021-01-30 LAB — TROPONIN I (HIGH SENSITIVITY)
Troponin I (High Sensitivity): 11 ng/L (ref <18)
Troponin I (High Sensitivity): 35 ng/L — ABNORMAL HIGH (ref <18)

## 2021-01-30 MED ORDER — HYDRALAZINE HCL 20 MG/ML IJ SOLN: 5.0000 mg | Freq: Four times a day (QID) | INTRAMUSCULAR | Status: DC | PRN

## 2021-01-30 MED ORDER — INSULIN ASPART 100 UNIT/ML IJ SOLN: 0.0000 [IU] | Freq: Three times a day (TID) | INTRAMUSCULAR | Status: DC

## 2021-01-30 MED ORDER — ASPIRIN EC 81 MG PO TBEC: 81.0000 mg | DELAYED_RELEASE_TABLET | Freq: Every day | ORAL | Status: DC

## 2021-01-30 MED ORDER — INSULIN ASPART 100 UNIT/ML IJ SOLN
0.0000 [IU] | Freq: Every day | INTRAMUSCULAR | Status: DC
  Administered 2021-01-30: 2 [IU] via SUBCUTANEOUS
  Filled 2021-01-30: qty 1

## 2021-01-30 MED ORDER — ADULT MULTIVITAMIN W/MINERALS CH: 1.0000 | ORAL_TABLET | Freq: Every day | ORAL | Status: DC

## 2021-01-30 MED ORDER — ENOXAPARIN SODIUM 40 MG/0.4ML IJ SOSY
40.0000 mg | PREFILLED_SYRINGE | INTRAMUSCULAR | Status: DC
  Administered 2021-01-30: 40 mg via SUBCUTANEOUS
  Filled 2021-01-30: qty 0.4

## 2021-01-30 MED ORDER — ONDANSETRON HCL 4 MG/2ML IJ SOLN: 4.0000 mg | Freq: Four times a day (QID) | INTRAMUSCULAR | Status: DC | PRN

## 2021-01-30 MED ORDER — ACETAMINOPHEN 325 MG PO TABS: 650.0000 mg | ORAL_TABLET | ORAL | Status: DC | PRN

## 2021-01-30 MED ORDER — LISINOPRIL 10 MG PO TABS: 5.0000 mg | ORAL_TABLET | Freq: Every day | ORAL | Status: DC

## 2021-01-30 MED ORDER — VITAMIN D 25 MCG (1000 UNIT) PO TABS: 1000.0000 [IU] | ORAL_TABLET | Freq: Every day | ORAL | Status: DC

## 2021-01-30 NOTE — ED Notes (Signed)
Dow Adolph, DO notified in person regarding patient's BP, request for Lisinopril or other antihypertensive, and request for diet.

## 2021-01-30 NOTE — ED Notes (Signed)
Echo @ the bedside

## 2021-01-30 NOTE — H&P (Signed)
History and Physical  Laurie Patel Laurie Patel DOB: 17-Sep-1927 DOA: 01/30/2021  Referring physician: Dr. Katrinka Blazing, EDP. PCP: Glori Luis, MD  Outpatient Specialists: Podiatry, ENT, Patient coming from: Home.  Chief Complaint: Chest pressure, palpitations, shortness of breath.  HPI: Laurie Patel is a 87 y.o. female with medical history significant for essential hypertension, type 2 diabetes, LBBB who presented to [facility] ED due to exertional chest tightness radiating to her left arm associated with heart racing, shortness of breath, and mild dizziness.  Onset around 5 AM, woke her out of sleep then subsided.  Returned to sleep and around 7 AM the same symptoms recurred as she was doing her daily house chores.  She stopped to rest with some improvement.  A family member advised to take her to the urgent care.  She presented to urgent care for further evaluation.  She was found to have severely elevated SBP greater than 200.  Patient was sent to the ED via EMS for further evaluation.  Upon presentation to the ED, chest pain had resolved however there was concern for hypertensive emergency with Rising troponin and SBP greater than 200.  EDP requested admission for observation.  Patient endorses that similar symptoms have happened twice previously for the past 2 years.  ED Course:  Temperature 98.4.  BP 189/71, pulse 60, respiration rate 17, O2 saturation 96% on room air.  Lab studies remarkable for troponin 11, repeat 35.  Serum glucose 141.  CBC essentially unremarkable.  Review of Systems: Review of systems as noted in the HPI. All other systems reviewed and are negative.   Past Medical History:  Diagnosis Date   Ankle fracture 10/14/2017   Heart murmur    PNA (pneumonia) 02/16/2018   Past Surgical History:  Procedure Laterality Date   APPENDECTOMY  1986   EYE SURGERY Bilateral 1995 and 1999   Lens implants   WRIST SURGERY Left 1986   ORIF     Social History:  reports that she has  never smoked. She has never used smokeless tobacco. She reports that she does not drink alcohol and does not use drugs.   Allergies  Allergen Reactions   Augmentin [Amoxicillin-Pot Clavulanate] Nausea And Vomiting   Prednisone     Family History  Problem Relation Age of Onset   Cancer Mother 72       Breast Cancer   Heart disease Father 60       Congestive Heart Failure   COPD Father 10       Emphysema   Heart disease Brother 46       massive heart attack   Heart disease Maternal Grandmother    Heart disease Maternal Grandfather    Early death Paternal Grandmother        Early death unsure age   Heart disease Paternal Grandfather    Obesity Paternal Grandfather       Prior to Admission medications   Medication Sig Start Date End Date Taking? Authorizing Provider  aspirin 81 MG EC tablet Take 81 mg by mouth daily. Swallow whole.    [provider]  Continuous Blood Gluc Receiver (FREESTYLE LIBRE 14 DAY READER) DEVI Use at least every 8 hours to check glucose. Dx code E11.9. 01/19/21   Glori Luis, MD  Continuous Blood Gluc Sensor (FREESTYLE LIBRE 14 DAY SENSOR) MISC Apply every 14 days. Dx code E11.9. 01/19/21   Glori Luis, MD  linagliptin (TRADJENTA) 5 MG TABS tablet Take 1 tablet (5 mg  total) by mouth once a week. 07/20/20   Glori Luis, MD  lisinopril (ZESTRIL) 5 MG tablet TAKE ONE TABLET BY MOUTH DAILY 01/15/21   Glori Luis, MD  Multiple Vitamins-Calcium (ONE-A-DAY WOMENS PO) Take 1 tablet by mouth daily.    [provider]  polyethylene glycol powder (GLYCOLAX/MIRALAX) 17 GM/SCOOP powder Take 17 g by mouth daily as needed for mild constipation. Patient not taking: Reported on 01/02/2021 04/18/20   Glori Luis, MD    Physical Exam: BP (!) 154/59   Pulse (!) 57   Temp 98.4 F (36.9 C) (Oral)   Resp 13   Ht 5\' 5"  (1.651 m)   Wt 63.5 kg   SpO2 95%   BMI 23.30 kg/m   General: 87 y.o. year-old female well developed  well nourished in no acute distress.  Alert and oriented x3. Cardiovascular: Regular rate and rhythm with no rubs or gallops.  No thyromegaly or JVD noted.  No lower extremity edema. 2/4 pulses in all 4 extremities. Respiratory: Clear to auscultation with no wheezes or rales. Good inspiratory effort. Abdomen: Soft nontender nondistended with normal bowel sounds x4 quadrants. Muskuloskeletal: No cyanosis, clubbing or edema noted bilaterally Neuro: CN II-XII intact, strength, sensation, reflexes Skin: No ulcerative lesions noted or rashes Psychiatry: Judgement and insight appear normal. Mood is appropriate for condition and setting          Labs on Admission:  Basic Metabolic Panel: Recent Labs  Lab 01/30/21 1128  NA 140  K 4.1  CL 100  CO2 30  GLUCOSE 141*  BUN 13  CREATININE 0.70  CALCIUM 9.9   Liver Function Tests: No results for input(s): AST, ALT, ALKPHOS, BILITOT, PROT, ALBUMIN in the last 168 hours. No results for input(s): LIPASE, AMYLASE in the last 168 hours. No results for input(s): AMMONIA in the last 168 hours. CBC: Recent Labs  Lab 01/30/21 1128  WBC 9.5  HGB 16.0*  HCT 49.1*  MCV 82.8  PLT 203   Cardiac Enzymes: No results for input(s): CKTOTAL, CKMB, CKMBINDEX, TROPONINI in the last 168 hours.  BNP (last 3 results) No results for input(s): BNP in the last 8760 hours.  ProBNP (last 3 results) No results for input(s): PROBNP in the last 8760 hours.  CBG: No results for input(s): GLUCAP in the last 168 hours.  Radiological Exams on Admission: DG Chest 2 View  Result Date: 01/30/2021 CLINICAL DATA:  Chest pain EXAM: CHEST - 2 VIEW COMPARISON:  05/01/2013 FINDINGS: The heart size and mediastinal contours are within normal limits. Both lungs are clear. The visualized skeletal structures are unremarkable. IMPRESSION: No active cardiopulmonary disease. Electronically Signed   By: 13/01/2013 M.D.   On: 01/30/2021 12:22   CT HEAD WO CONTRAST  (04/01/2021)  Result Date: 01/30/2021 CLINICAL DATA:  87 year old female with dizziness EXAM: CT HEAD WITHOUT CONTRAST TECHNIQUE: Contiguous axial images were obtained from the base of the skull through the vertex without intravenous contrast. COMPARISON:  11/13/2017 CT temporal bone, 04/15/2005 CT brain FINDINGS: Brain: No evidence of acute infarction, hemorrhage, hydrocephalus, extra-axial collection or mass lesion/mass effect. Hypointensity in the periventricular white matter, likely sequela of chronic small vessel ischemic disease Vascular: Calcifications in the bilateral vertebral arteries. No hyperdense vessel or unexpected calcification. Skull: Severe degenerative changes in the right temporomandibular joint. Negative for fracture or focal lesion. Sinuses/Orbits: No acute finding. Status post bilateral lens replacements. Other: Redemonstrated confluence of the right mastoid air cells, overall similar to the prior exam. Soft  tissue extends into the middle ear, again likely involving the ossicles, better evaluated on the prior exam. Likely dehiscence of the tegmen tympani and tegmen mastoideum are also better evaluated on the prior exam, without pneumocephalus. IMPRESSION: 1. Redemonstrated sequela of chronic right otitis media and possible cholesteatoma, with dehiscence of the tegmen tympani and mastoideum. This was better evaluated on the 11/13/2017 temporal bone CT, and could be followed up with a temporal bone CT. 2. No acute intracranial process. No other etiology is seen for the patient's dizziness. Electronically Signed   By: Wiliam Ke MD   On: 01/30/2021 13:27    EKG: I independently viewed the EKG done and my findings are as followed: Normal sinus rhythm rate of 61.  LBBB.  Nonspecific ST-T changes.  QTc 408.  Assessment/Plan Present on Admission:  Chest pain  Active Problems:   Chest pain  Chest pain, rule out ACS. Presented with complaints of exertional centrally located chest tightness,  radiating to her left arm. Upon presentation BP significantly elevated greater than 200 systolically. Uprising troponin, 35. Chest pain has resolved Recycle troponin x2 Obtain fasting lipid panel in the morning Obtain 2D echo Cardiology consult  Hypertensive emergency Presented with SBP greater than 200 and elevated troponin 35. Resume home oral antihypertensives IV antihypertensives as needed with parameters Closely monitor vital signs  Type 2 diabetes with hyperglycemia Hemoglobin A1c Start insulin sliding scale.  Dizziness/ambulatory dysfunction PT OT to assess Fall precautions   DVT prophylaxis: Subcu Lovenox daily  Code Status: Full code  Family Communication: None at bedside  Disposition Plan: Admit to MedSurg with remote telemetry  Consults called: Cardiology consult  Admission status: Observation status.   Status is: Observation   Dispo:  Patient From: Home  Planned Disposition: Home, possibly on 01/31/2021.  Medically stable for discharge: No      Darlin Drop MD [facility] Pager [phone]  If 7PM-7AM, please contact night-coverage [email or web address] Password [facility]  01/30/2021, 4:58 PM

## 2021-01-30 NOTE — ED Triage Notes (Signed)
Pt woke up around 5 am with chest tightness, left arm discomfort and numbness and SOB. Hx of irregular heart beat.

## 2021-01-30 NOTE — ED Notes (Signed)
Pt ambulated to the restroom with the use of a walker 

## 2021-01-30 NOTE — ED Provider Notes (Signed)
Renaldo Fiddler    CSN: [id number] Arrival date & time: 01/30/21  6599      History   Chief Complaint Chief Complaint  Patient presents with   Chest Pain   Shortness of Breath     HPI Laurie Patel is a 87 y.o. female.  Patient presents with dizziness, chest "tightness" and "pressure", shortness of breath, and left arm numbness since she woke up this morning at 5 AM.  She denies focal weakness.  She reports history of irregular heartbeat.  She is diabetic and has history of hypertension also.  She denies history of MI or CVA.  The history is provided by the patient and medical records.   Past Medical History:  Diagnosis Date   Ankle fracture 10/14/2017   Heart murmur    PNA (pneumonia) 02/16/2018    Patient Active Problem List   Diagnosis Date Noted   Constipation 04/18/2020   Elevated hemoglobin (HCC) 04/18/2020   Bilateral hand numbness 03/14/2020   Skin lesions 11/09/2019   Ear discomfort, right 05/12/2018   Dermatitis, eczematoid 02/16/2018   H/O: osteoarthritis 02/16/2018   Irregular cardiac rhythm 02/16/2018   Low back pain 02/16/2018   Nonspecific abnormal finding in stool contents 02/16/2018   Balance problem 10/14/2017   Palpitations 06/21/2016   Hyperlipidemia 03/21/2016   Allergic rhinitis 03/21/2016   Diabetes (HCC) 09/14/2015   HTN (hypertension) 08/27/2014   Breathlessness on exertion 10/05/2009   OP (osteoporosis) 09/04/2009    Past Surgical History:  Procedure Laterality Date   APPENDECTOMY  1986   EYE SURGERY Bilateral 1995 and 1999   Lens implants   WRIST SURGERY Left 1986   ORIF     OB History   No obstetric history on file.      Home Medications    Prior to Admission medications   Medication Sig Start Date End Date Taking? Authorizing Provider  aspirin 81 MG EC tablet Take 81 mg by mouth daily. Swallow whole.    [provider]  Continuous Blood Gluc Receiver (FREESTYLE LIBRE 14 DAY READER) DEVI Use at least  every 8 hours to check glucose. Dx code E11.9. 01/19/21   Glori Luis, MD  Continuous Blood Gluc Sensor (FREESTYLE LIBRE 14 DAY SENSOR) MISC Apply every 14 days. Dx code E11.9. 01/19/21   Glori Luis, MD  linagliptin (TRADJENTA) 5 MG TABS tablet Take 1 tablet (5 mg total) by mouth once a week. 07/20/20   Glori Luis, MD  lisinopril (ZESTRIL) 5 MG tablet TAKE ONE TABLET BY MOUTH DAILY 01/15/21   Glori Luis, MD  Multiple Vitamins-Calcium (ONE-A-DAY WOMENS PO) Take 1 tablet by mouth daily.    [provider]  polyethylene glycol powder (GLYCOLAX/MIRALAX) 17 GM/SCOOP powder Take 17 g by mouth daily as needed for mild constipation. Patient not taking: Reported on 01/02/2021 04/18/20   Glori Luis, MD    Family History Family History  Problem Relation Age of Onset   Cancer Mother 94       Breast Cancer   Heart disease Father 65       Congestive Heart Failure   COPD Father 24       Emphysema   Heart disease Brother 54       massive heart attack   Heart disease Maternal Grandmother    Heart disease Maternal Grandfather    Early death Paternal Grandmother        Early death unsure age   Heart disease Paternal Grandfather  Obesity Paternal Grandfather     Social History Social History   Tobacco Use   Smoking status: Never   Smokeless tobacco: Never  Vaping Use   Vaping Use: Never used  Substance Use Topics   Alcohol use: No    Alcohol/week: 0.0 standard drinks   Drug use: No     Allergies   Augmentin [amoxicillin-pot clavulanate] and Prednisone   Review of Systems Review of Systems  Constitutional:  Negative for chills and fever.  Respiratory:  Positive for shortness of breath. Negative for cough.   Cardiovascular:  Positive for chest pain. Negative for palpitations.  Gastrointestinal:  Negative for nausea and vomiting.  Skin:  Negative for color change and rash.  Neurological:  Positive for dizziness and numbness. Negative for  syncope and weakness.  All other systems reviewed and are negative.   Physical Exam Triage Vital Signs ED Triage Vitals [01/30/21 0929]  Enc Vitals Group     BP (!) 204/83     Pulse Rate (!) 105     Resp 18     Temp 98.5 F (36.9 C)     Temp Source Oral     SpO2 94 %     Weight      Height      Head Circumference      Peak Flow      Pain Score      Pain Loc      Pain Edu?      Excl. in GC?    No data found.  Updated Vital Signs BP (!) 204/83 (BP Location: Left Arm)   Pulse (!) 105   Temp 98.5 F (36.9 C) (Oral)   Resp 18   SpO2 94%   Visual Acuity Right Eye Distance:   Left Eye Distance:   Bilateral Distance:    Right Eye Near:   Left Eye Near:    Bilateral Near:     Physical Exam Vitals and nursing note reviewed.  Constitutional:      General: She is not in acute distress.    Appearance: She is well-developed.  HENT:     Head: Normocephalic and atraumatic.     Mouth/Throat:     Mouth: Mucous membranes are moist.  Debois:     Conjunctiva/sclera: Conjunctivae normal.  Cardiovascular:     Rate and Rhythm: Normal rate and regular rhythm.     Heart sounds: Normal heart sounds.  Pulmonary:     Effort: Pulmonary effort is normal. No respiratory distress.     Breath sounds: Normal breath sounds. No rales.  Abdominal:     Palpations: Abdomen is soft.     Tenderness: There is no abdominal tenderness.  Musculoskeletal:     Cervical back: Neck supple.     Right lower leg: No edema.     Left lower leg: No edema.  Skin:    General: Skin is warm and dry.  Neurological:     General: No focal deficit present.     Mental Status: She is alert and oriented to person, place, and time.     Sensory: No sensory deficit.     Motor: No weakness.     Comments: No focal weakness.  Psychiatric:        Mood and Affect: Mood normal.        Behavior: Behavior normal.     UC Treatments / Results  Labs (all labs ordered are listed, but only abnormal results are  displayed) Labs Reviewed  POCT  FASTING CBG [facility] MANUAL ENTRY - Abnormal; Notable for the following components:      Result Value   POCT Glucose ([facility]) 207 (*)    All other components within normal limits    EKG   Radiology No results found.  Procedures Procedures (including critical care time)  Medications Ordered in UC Medications - No data to display  Initial Impression / Assessment and Plan / UC Course  I have reviewed the triage vital signs and the nursing notes.  Pertinent labs & imaging results that were available during my care of the patient were reviewed by me and considered in my medical decision making (see chart for details).  Chest pain, shortness of breath, dizziness, left arm numbness.  EKG shows sinus rhythm, rate 95, ST elevation in leads V1 and V2, ST depression in leads V5 and V6, and left bundle branch block which was present on her previous on 04/10/2020.  Based on her age and symptoms, sending patient to the ED via EMS.      Final Clinical Impressions(s) / UC Diagnoses   Final diagnoses:  Chest pain, unspecified type  Shortness of breath  Dizziness  Left arm numbness   Discharge Instructions   None    ED Prescriptions   None    PDMP not reviewed this encounter.   Mickie Bail, NP 01/30/21 [phone]

## 2021-01-30 NOTE — ED Notes (Signed)
edp notified in person that pts trop increased to 35. No new orders.

## 2021-01-30 NOTE — Discharge Instructions (Signed)
As we discussed, please increase your lisinopril blood pressure medication to 10 mg once daily.  Take 2 pills at a time until you can follow-up with your PCP to discuss your regimen and possibly get a new prescription.  If you develop any further worsening symptoms, such as passing out all the way, chest pain or shortness of breath that will not go away, uncontrolled blood pressure at home, please return to the ED.

## 2021-01-30 NOTE — ED Notes (Signed)
Per day shift RN the Pts COVID test was negative.

## 2021-01-30 NOTE — ED Provider Notes (Signed)
[facility] Emergency Department Provider Note ____________________________________________   Event Date/Time   First MD Initiated Contact with Patient 01/30/21 1506     (approximate)  I have reviewed the triage vital signs and the nursing notes.  HISTORY  Chief Complaint Dizziness and Chest Pain   HPI Laurie Patel is a 87 y.o. femalewho presents to the ED for evaluation of chest pain, dizziness.   Chart review indicates hx hypertension on lisinopril, DM, longstanding balance issues when reviewing PCP note from last month.  Patient presents to the ED for evaluation of an episode of shortness of breath, chest pressure and dizziness that occurred this morning, and now since resolved.  She reports that she sometimes gets dizzy over the past few years, and the episode this morning was a typical syndrome that she has felt before.  She reports a sensation of palpitations when she awoke this morning to void at her typical time.  She reports feeling "okay" as she went about her typical morning routine.  She reports a consistent sensation of shortness of breath with chest pressure and associated dizziness this morning for about 2 hours prior to self resolving.  She denies any associated orthopnea, cough, fever, syncopal episodes, falls or injuries.  Denies abdominal pain, emesis or stool changes.  Patient reports feeling better now and is requesting discharge by the time that I evaluate her.  She unfortunately sat in our waiting room for about 5 hours prior to my evaluation due to prolonged wait times and a bed hold associated with staffing issues.  She reports being asymptomatic throughout this time.  She reports lives at home with her daughter, ambulates with a Rollator.  Feels comfortable going home.  Past Medical History:  Diagnosis Date   Ankle fracture 10/14/2017   Heart murmur    PNA (pneumonia) 02/16/2018    Patient Active Problem List   Diagnosis Date  Noted   Constipation 04/18/2020   Elevated hemoglobin (HCC) 04/18/2020   Bilateral hand numbness 03/14/2020   Skin lesions 11/09/2019   Ear discomfort, right 05/12/2018   Dermatitis, eczematoid 02/16/2018   H/O: osteoarthritis 02/16/2018   Irregular cardiac rhythm 02/16/2018   Low back pain 02/16/2018   Nonspecific abnormal finding in stool contents 02/16/2018   Balance problem 10/14/2017   Palpitations 06/21/2016   Hyperlipidemia 03/21/2016   Allergic rhinitis 03/21/2016   Diabetes (HCC) 09/14/2015   HTN (hypertension) 08/27/2014   Breathlessness on exertion 10/05/2009   OP (osteoporosis) 09/04/2009    Past Surgical History:  Procedure Laterality Date   APPENDECTOMY  1986   EYE SURGERY Bilateral 1995 and 1999   Lens implants   WRIST SURGERY Left 1986   ORIF     Prior to Admission medications   Medication Sig Start Date End Date Taking? Authorizing Provider  aspirin 81 MG EC tablet Take 81 mg by mouth daily. Swallow whole.    [provider]  Continuous Blood Gluc Receiver (FREESTYLE LIBRE 14 DAY READER) DEVI Use at least every 8 hours to check glucose. Dx code E11.9. 01/19/21   Glori Luis, MD  Continuous Blood Gluc Sensor (FREESTYLE LIBRE 14 DAY SENSOR) MISC Apply every 14 days. Dx code E11.9. 01/19/21   Glori Luis, MD  linagliptin (TRADJENTA) 5 MG TABS tablet Take 1 tablet (5 mg total) by mouth once a week. 07/20/20   Glori Luis, MD  lisinopril (ZESTRIL) 5 MG tablet TAKE ONE TABLET BY MOUTH DAILY 01/15/21   Glori Luis,  MD  Multiple Vitamins-Calcium (ONE-A-DAY WOMENS PO) Take 1 tablet by mouth daily.    [provider]  polyethylene glycol powder (GLYCOLAX/MIRALAX) 17 GM/SCOOP powder Take 17 g by mouth daily as needed for mild constipation. Patient not taking: Reported on 01/02/2021 04/18/20   Glori Luis, MD    Allergies Augmentin [amoxicillin-pot clavulanate] and Prednisone  Family History  Problem Relation Age of  Onset   Cancer Mother 63       Breast Cancer   Heart disease Father 42       Congestive Heart Failure   COPD Father 76       Emphysema   Heart disease Brother 54       massive heart attack   Heart disease Maternal Grandmother    Heart disease Maternal Grandfather    Early death Paternal Grandmother        Early death unsure age   Heart disease Paternal Grandfather    Obesity Paternal Grandfather     Social History Social History   Tobacco Use   Smoking status: Never   Smokeless tobacco: Never  Vaping Use   Vaping Use: Never used  Substance Use Topics   Alcohol use: No    Alcohol/week: 0.0 standard drinks   Drug use: No    Review of Systems  Constitutional: No fever/chills.  Positive for dizziness Brault: No visual changes. ENT: No sore throat. Cardiovascular: Positive for chest pain. Respiratory: Positive for shortness of breath. Gastrointestinal: No abdominal pain.  No nausea, no vomiting.  No diarrhea.  No constipation. Genitourinary: Negative for dysuria. Musculoskeletal: Negative for back pain. Skin: Negative for rash. Neurological: Negative for headaches, focal weakness or numbness.  ____________________________________________   PHYSICAL EXAM:  VITAL SIGNS: Vitals:   01/30/21 1615 01/30/21 1630  BP: (!) 150/62 (!) 154/59  Pulse: 65 (!) 57  Resp: 19 13  Temp:    SpO2: 97% 95%     Constitutional: Alert and oriented. Well appearing and in no acute distress.  Pleasant and conversational in full sentences. Modesitt: Conjunctivae are normal. PERRL. EOMI. Head: Atraumatic. Nose: No congestion/rhinnorhea. Mouth/Throat: Mucous membranes are moist.  Oropharynx non-erythematous. Neck: No stridor. No cervical spine tenderness to palpation. Cardiovascular: Normal rate, regular rhythm. Grossly normal heart sounds.  Good peripheral circulation. Respiratory: Normal respiratory effort.  No retractions. Lungs CTAB. Gastrointestinal: Soft , nondistended, nontender to  palpation. No CVA tenderness. Musculoskeletal: No lower extremity tenderness nor edema.  No joint effusions. No signs of acute trauma. Neurologic:  Normal speech and language. No gross focal neurologic deficits are appreciated. No gait instability noted. Skin:  Skin is warm, dry and intact. No rash noted. Psychiatric: Mood and affect are normal. Speech and behavior are normal.  ____________________________________________   LABS (all labs ordered are listed, but only abnormal results are displayed)  Labs Reviewed  BASIC METABOLIC PANEL - Abnormal; Notable for the following components:      Result Value   Glucose, Bld 141 (*)    All other components within normal limits  CBC - Abnormal; Notable for the following components:   RBC 5.93 (*)    Hemoglobin 16.0 (*)    HCT 49.1 (*)    All other components within normal limits  TROPONIN I (HIGH SENSITIVITY) - Abnormal; Notable for the following components:   Troponin I (High Sensitivity) 35 (*)    All other components within normal limits  TROPONIN I (HIGH SENSITIVITY)   ____________________________________________  12 Lead EKG  Sinus rhythm, rate 95 bpm.  Normal axis.  Left bundle.  No evidence of acute ischemia per Sgarbossa criteria. Similar to EKG from 10 months ago. ____________________________________________  RADIOLOGY  ED MD interpretation: CT head reviewed by me without evidence of acute intracranial pathology. 2 3 CXR reviewed by me without evidence of acute cardiopulmonary pathology.  Official radiology report(s): DG Chest 2 View  Result Date: 01/30/2021 CLINICAL DATA:  Chest pain EXAM: CHEST - 2 VIEW COMPARISON:  05/01/2013 FINDINGS: The heart size and mediastinal contours are within normal limits. Both lungs are clear. The visualized skeletal structures are unremarkable. IMPRESSION: No active cardiopulmonary disease. Electronically Signed   By: Signa Kellaylor  Stroud M.D.   On: 01/30/2021 12:22   CT HEAD WO CONTRAST  (5MM)  Result Date: 01/30/2021 CLINICAL DATA:  87 year old female with dizziness EXAM: CT HEAD WITHOUT CONTRAST TECHNIQUE: Contiguous axial images were obtained from the base of the skull through the vertex without intravenous contrast. COMPARISON:  11/13/2017 CT temporal bone, 04/15/2005 CT brain FINDINGS: Brain: No evidence of acute infarction, hemorrhage, hydrocephalus, extra-axial collection or mass lesion/mass effect. Hypointensity in the periventricular white matter, likely sequela of chronic small vessel ischemic disease Vascular: Calcifications in the bilateral vertebral arteries. No hyperdense vessel or unexpected calcification. Skull: Severe degenerative changes in the right temporomandibular joint. Negative for fracture or focal lesion. Sinuses/Orbits: No acute finding. Status post bilateral lens replacements. Other: Redemonstrated confluence of the right mastoid air cells, overall similar to the prior exam. Soft tissue extends into the middle ear, again likely involving the ossicles, better evaluated on the prior exam. Likely dehiscence of the tegmen tympani and tegmen mastoideum are also better evaluated on the prior exam, without pneumocephalus. IMPRESSION: 1. Redemonstrated sequela of chronic right otitis media and possible cholesteatoma, with dehiscence of the tegmen tympani and mastoideum. This was better evaluated on the 11/13/2017 temporal bone CT, and could be followed up with a temporal bone CT. 2. No acute intracranial process. No other etiology is seen for the patient's dizziness. Electronically Signed   By: Wiliam KeAlison  Vasan MD   On: 01/30/2021 13:27    ____________________________________________   PROCEDURES and INTERVENTIONS  Procedure(s) performed (including Critical Care):  .1-3 Lead EKG Interpretation  Date/Time: 01/30/2021 3:44 PM Performed by: Delton PrairieSmith, Lener Ventresca, MD Authorized by: Delton PrairieSmith, Genie Mirabal, MD     Interpretation: normal     ECG rate:  60   ECG rate assessment: normal      Rhythm: sinus rhythm     Ectopy: PVCs     Conduction: normal (LBBB)    Medications - No data to display  ____________________________________________   MDM / ED COURSE   87 year old woman presents to the ED after transient episode of chest pain or shortness of breath, with rising troponins requiring medical observation admission.  She was triaged as quite hypertensive with systolics around 200, this improves without intervention to systolics in the 150s after she is roomed.  She is asymptomatic here in the ED with a reassuring EKG with a left bundle at baseline without acutely ischemic features.  Troponin is negative and initial work-up is benign.  Second troponin with a rather large delta.  Due to this, and her poorly controlled blood pressure, we will admit to medicine for observation.   Clinical Course as of 01/30/21 1649  Tue Jan 30, 2021  1528 BP improved with systolic 190 while I am in the room talking with the patient. She reports resolution of symptoms.  She is requesting discharge.  She was at home with her daughter. Shared  decision-making yields plan to perform repeat troponin, monitor during this timeframe and hopeful discharge home as long as no worsening symptoms or troponinemia. [DS]  1537 Reassessed.  Still asymptomatic.  We discussed her antihypertensive regimen.  She reports that she used to be on 20 mg lisinopril, but this was cut back to 5 mg by her PCP due to concern for dizziness.  We discussed increasing to 10 mg daily and following up closely with her PCP. [DS]  1639 Reassessed.  Patient still asymptomatic.  BP improved with systolic 156.  We discussed increasing troponin and my recommendation for medical admission.  She is agreeable [DS]  1648 I discussed the case with Dr. Margo Aye, who agrees to admit. [DS]    Clinical Course User Index [DS] Delton Prairie, MD    ____________________________________________   FINAL CLINICAL IMPRESSION(S) / ED DIAGNOSES  Final  diagnoses:  Dizziness  Other chest pain  Primary hypertension     ED Discharge Orders     None        Berlin Viereck Katrinka Blazing   Note:  This document was prepared using Dragon voice recognition software and may include unintentional dictation errors.    Delton Prairie, MD 01/30/21 [phone]

## 2021-01-30 NOTE — ED Triage Notes (Signed)
First Nurse Note:  Arrives from urgent care for ED evaluation of dizziness.  Per EMS report, patient had c/o dizziness.  BP elevated, has history of HTN.  Urgent care concerned for MI and stroke.  Per EMS report, stroke screen negative.  Patient with BBB on EKG -- not new since 2017.  Patient without complaint.  AAOx3.  Skin warm and dry.

## 2021-01-30 NOTE — ED Triage Notes (Addendum)
Pt here with dizziness and chest tightness from the UC. Pt states the tightness has been occurring for a while but got worse today. Pt states that her balance has been off lately as well. Pt states that she feels fine at this moment but the chest tightness earlier concerned her. Pt also c/o mild headache.

## 2021-01-30 NOTE — ED Notes (Signed)
Patient had questions regarding Lisinopril. Per pt, MD had said around 4 pm that he recommended another dose of Lisinopril. Let pt know I would followup with MD.

## 2021-01-31 ENCOUNTER — Telehealth: Payer: Self-pay | Admitting: Family Medicine

## 2021-01-31 ENCOUNTER — Telehealth: Payer: Self-pay

## 2021-01-31 DIAGNOSIS — R079 Chest pain, unspecified: Secondary | ICD-10-CM | POA: Diagnosis not present

## 2021-01-31 LAB — MAGNESIUM: Magnesium: 2.1 mg/dL (ref 1.7–2.4)

## 2021-01-31 LAB — ECHOCARDIOGRAM COMPLETE
Area-P 1/2: 2.8 cm2
Height: 65 in
S' Lateral: 2.21 cm
Weight: 2240 oz

## 2021-01-31 LAB — HEMOGLOBIN A1C
Hgb A1c MFr Bld: 7.5 % — ABNORMAL HIGH (ref 4.8–5.6)
Mean Plasma Glucose: 168.55 mg/dL

## 2021-01-31 LAB — BASIC METABOLIC PANEL
Anion gap: 7 (ref 5–15)
BUN: 16 mg/dL (ref 8–23)
CO2: 28 mmol/L (ref 22–32)
Calcium: 9.6 mg/dL (ref 8.9–10.3)
Chloride: 104 mmol/L (ref 98–111)
Creatinine, Ser: 0.58 mg/dL (ref 0.44–1.00)
GFR, Estimated: >60 mL/min (ref >60)
Glucose, Bld: 162 mg/dL — ABNORMAL HIGH (ref 70–99)
Potassium: 3.8 mmol/L (ref 3.5–5.1)
Sodium: 139 mmol/L (ref 135–145)

## 2021-01-31 LAB — CBC
HCT: 46.6 % — ABNORMAL HIGH (ref 36.0–46.0)
Hemoglobin: 15.5 g/dL — ABNORMAL HIGH (ref 12.0–15.0)
MCH: 27.8 pg (ref 26.0–34.0)
MCHC: 33.3 g/dL (ref 30.0–36.0)
MCV: 83.7 fL (ref 80.0–100.0)
Platelets: 199 10*3/uL (ref 150–400)
RBC: 5.57 MIL/uL — ABNORMAL HIGH (ref 3.87–5.11)
RDW: 13.9 % (ref 11.5–15.5)
WBC: 10.1 10*3/uL (ref 4.0–10.5)
nRBC: 0 % (ref 0.0–0.2)

## 2021-01-31 LAB — CBG MONITORING, ED: Glucose-Capillary: 135 mg/dL — ABNORMAL HIGH (ref 70–99)

## 2021-01-31 LAB — PHOSPHORUS: Phosphorus: 3.6 mg/dL (ref 2.5–4.6)

## 2021-01-31 NOTE — ED Notes (Signed)
Pt ambulated to the restroom with the use of a walker 

## 2021-01-31 NOTE — Telephone Encounter (Signed)
Patient spent the night in the ED and the doctor there told patient her  lisinopril (ZESTRIL) 5 MG tablet, need to be increased to 10mg . Scheduled an appointment to se Dr .

## 2021-01-31 NOTE — ED Notes (Signed)
Pts 0500 labs to be obtained by phlebotomy.  

## 2021-01-31 NOTE — ED Notes (Signed)
Phlebotomy @ the bedside. 

## 2021-01-31 NOTE — Telephone Encounter (Signed)
-----   Message from Jarvis Newcomer, RN sent at 01/31/2021  8:49 AM EDT ----- Regarding: FW: ER follow-up  ----- Message ----- From: Marianne Sofia, PA-C Sent: 01/31/2021   8:36 AM EDT To: [facility] Triage Subject: ER follow-up                                   Pt needs ER follow-up. I think new patient. In 2-3 weeks if able. Thanks!

## 2021-01-31 NOTE — Discharge Summary (Signed)
Physician Discharge Summary  Laurie Patel KTG:[id number] DOB: [birth year] DOA: 01/30/2021  PCP: Glori Luis, MD  Admit date: 01/30/2021 Discharge date: 01/31/2021  Admitted From: Home Disposition: Home  Recommendations for Outpatient Follow-up:  Follow up with PCP in 1-2 weeks Follow-up with [facility] cardiology  Home Health: No Equipment/Devices: None  Discharge Condition: Stable CODE STATUS: Full Diet recommendation: Heart Healthy / Carb Modified  Brief/Interim Summary: 87 y.o. female with medical history significant for essential hypertension, type 2 diabetes, LBBB who presented to [facility] ED due to exertional chest tightness radiating to her left arm associated with heart racing, shortness of breath, and mild dizziness.  Onset around 5 AM, woke her out of sleep then subsided.  Returned to sleep and around 7 AM the same symptoms recurred as she was doing her daily house chores.  She stopped to rest with some improvement.  A family member advised to take her to the urgent care.  She presented to urgent care for further evaluation.  She was found to have severely elevated SBP greater than 200.  Patient was sent to the ED via EMS for further evaluation.  Upon presentation to the ED, chest pain had resolved however there was concern for hypertensive emergency with Rising troponin and SBP greater than 200.  EDP requested admission for observation.  Patient endorses that similar symptoms have happened twice previously for the past 2 years.  Patient was monitored in the emergency room overnight.  Blood pressure stabilized and returned to baseline.  No further episodes of chest pain.  High-sensitivity troponin peak at 35.  Low suspicion for ACS.  Presentation concerning for possible anginal symptoms.  Considering hemodynamic stability patient can be discharged home at this time and will follow up with cardiology on discharge for outpatient ischemic evaluation consideration.  Message sent to Dr.  Azucena Cecil.  Will place ambulatory referral at time of discharge.   Discharge Diagnoses:  Active Problems:   Chest pain  Chest pain, rule out ACS. Associated with exertion.  Radiation to left arm.  Also associated with a significant hypertensive emergency.  All the symptoms resolved by time patient presented to the emergency room.  Will discharge home with outpatient cardiology follow-up.  ACS ruled out  Hypertensive emergency Presented with SBP greater than 200 and elevated troponin 35. Unclear trigger for hypertensive urgency Blood pressure is at baseline at time of discharge Resume home regimen with outpatient PCP and cardiology follow-up   Type 2 diabetes with hyperglycemia Resume home regimen   Dizziness/ambulatory dysfunction Ambulating without dizziness at time of discharge    Discharge Instructions  Discharge Instructions     Ambulatory referral to Cardiology   Complete by: As directed    Diet - low sodium heart healthy   Complete by: As directed    Increase activity slowly   Complete by: As directed       Allergies as of 01/31/2021       Reactions   Augmentin [amoxicillin-pot Clavulanate] Nausea And Vomiting   Prednisone         Medication List     TAKE these medications    aspirin 81 MG EC tablet Take 81 mg by mouth daily. Swallow whole.   cholecalciferol 25 MCG (1000 UNIT) tablet Commonly known as: VITAMIN D3 Take 1,000 Units by mouth daily.   FreeStyle Libre 14 Day Reader Hardie Pulley Use at least every 8 hours to check glucose. Dx code E11.9.   FreeStyle Libre 14 Day Sensor Misc Apply every 14 days.  Dx code E11.9.   lisinopril 5 MG tablet Commonly known as: ZESTRIL TAKE ONE TABLET BY MOUTH DAILY   ONE-A-DAY WOMENS PO Take 1 tablet by mouth daily.       ASK your doctor about these medications    linagliptin 5 MG Tabs tablet Commonly known as: Tradjenta Take 1 tablet (5 mg total) by mouth once a week.        Follow-up Information      Glori Luis, MD. Schedule an appointment as soon as possible for a visit in 1 week(s).   Specialty: Family Medicine Contact information: [address] [place] [address] [place] [address] [phone]         [facility] EMERGENCY DEPARTMENT .   Specialty: Emergency Medicine Contact information: [id number] [place] [address] [phone]        Debbe Odea, MD Follow up.   Specialties: Cardiology, Radiology Why: Outpatient referral for Dr. Azucena Cecil has been placed.  His office will arrange a followup appointment for you Contact information: [address] [place] [address] [phone]                Allergies  Allergen Reactions   Augmentin [Amoxicillin-Pot Clavulanate] Nausea And Vomiting   Prednisone     Consultations: None   Procedures/Studies: DG Chest 2 View  Result Date: 01/30/2021 CLINICAL DATA:  Chest pain EXAM: CHEST - 2 VIEW COMPARISON:  05/01/2013 FINDINGS: The heart size and mediastinal contours are within normal limits. Both lungs are clear. The visualized skeletal structures are unremarkable. IMPRESSION: No active cardiopulmonary disease. Electronically Signed   By: Signa Kell M.D.   On: 01/30/2021 12:22   CT HEAD WO CONTRAST ( )  Result Date: 01/30/2021 CLINICAL DATA:  87 year old female with dizziness EXAM: CT HEAD WITHOUT CONTRAST TECHNIQUE: Contiguous axial images were obtained from the base of the skull through the vertex without intravenous contrast. COMPARISON:  11/13/2017 CT temporal bone, 04/15/2005 CT brain FINDINGS: Brain: No evidence of acute infarction, hemorrhage, hydrocephalus, extra-axial collection or mass lesion/mass effect. Hypointensity in the periventricular white matter, likely sequela of chronic small vessel ischemic disease Vascular: Calcifications in the bilateral vertebral arteries. No hyperdense vessel or unexpected calcification.  Skull: Severe degenerative changes in the right temporomandibular joint. Negative for fracture or focal lesion. Sinuses/Orbits: No acute finding. Status post bilateral lens replacements. Other: Redemonstrated confluence of the right mastoid air cells, overall similar to the prior exam. Soft tissue extends into the middle ear, again likely involving the ossicles, better evaluated on the prior exam. Likely dehiscence of the tegmen tympani and tegmen mastoideum are also better evaluated on the prior exam, without pneumocephalus. IMPRESSION: 1. Redemonstrated sequela of chronic right otitis media and possible cholesteatoma, with dehiscence of the tegmen tympani and mastoideum. This was better evaluated on the 11/13/2017 temporal bone CT, and could be followed up with a temporal bone CT. 2. No acute intracranial process. No other etiology is seen for the patient's dizziness. Electronically Signed   By: Wiliam Ke MD   On: 01/30/2021 13:27   ECHOCARDIOGRAM COMPLETE  Result Date: 01/31/2021    ECHOCARDIOGRAM REPORT   Patient Name:   Laurie Patel Poet Date of Exam: 01/30/2021 Medical Rec #:  [id number]      Height:       65.0 in Accession #:    [id number]     Weight:       140.0 lb Date of Birth:  December 19, 1927  BSA:          1.700 m Patient Age:    87 years       BP:           216/86 mmHg Patient Gender: F              HR:           91 bpm. Exam Location:  [facility] Procedure: 2D Echo, Cardiac Doppler and Color Doppler Indications:     R07.9 Chest pain  History:         Patient has prior history of Echocardiogram examinations, most                  recent 07/18/2016. Murmur.  Sonographer:     Sedonia SmallNaTashia Rodgers-Jones Referring Phys:  [id number] Oliver PilaAROLE N HALL Diagnosing Phys: Debbe OdeaBrian Agbor-Etang MD IMPRESSIONS  1. Severe assymetric septal hypertrophy (LV Basal-mid septum measuring upto 1.5cm). no SAM noted. consider CMR (outpatient) for HCM evaluation if clinically indicated. . Left ventricular ejection fraction, by estimation, is 55  to 60%. The left ventricle  has normal function. The left ventricle has no regional wall motion abnormalities. There is severe asymmetric left ventricular hypertrophy of the septal segment. Left ventricular diastolic parameters are consistent with Grade I diastolic dysfunction (impaired relaxation).  2. Right ventricular systolic function is normal. The right ventricular size is normal. There is normal pulmonary artery systolic pressure.  3. The mitral valve is degenerative. No evidence of mitral valve regurgitation.  4. The aortic valve is tricuspid. Aortic valve regurgitation is not visualized. Mild aortic valve sclerosis is present, with no evidence of aortic valve stenosis.  5. The inferior vena cava is normal in size with <50% respiratory variability, suggesting right atrial pressure of 8 mmHg. FINDINGS  Left Ventricle: Severe assymetric septal hypertrophy (LV Basal-mid septum measuring upto 1.5cm). no SAM noted. consider CMR (outpatient) for HCM evaluation if clinically indicated. Left ventricular ejection fraction, by estimation, is 55 to 60%. The left ventricle has normal function. The left ventricle has no regional wall motion abnormalities. The left ventricular internal cavity size was normal in size. There is severe asymmetric left ventricular hypertrophy of the septal segment. Left ventricular diastolic parameters are consistent with Grade I diastolic dysfunction (impaired relaxation). Right Ventricle: The right ventricular size is normal. No increase in right ventricular wall thickness. Right ventricular systolic function is normal. There is normal pulmonary artery systolic pressure. The tricuspid regurgitant velocity is 2.57 m/s, and  with an assumed right atrial pressure of 8 mmHg, the estimated right ventricular systolic pressure is 34.4 mmHg. Left Atrium: Left atrial size was normal in size. Right Atrium: Right atrial size was normal in size. Pericardium: There is no evidence of pericardial  effusion. Mitral Valve: The mitral valve is degenerative in appearance. There is mild thickening of the mitral valve leaflet(s). No evidence of mitral valve regurgitation. Tricuspid Valve: The tricuspid valve is normal in structure. Tricuspid valve regurgitation is trivial. Aortic Valve: The aortic valve is tricuspid. Aortic valve regurgitation is not visualized. Mild aortic valve sclerosis is present, with no evidence of aortic valve stenosis. Pulmonic Valve: The pulmonic valve was normal in structure. Pulmonic valve regurgitation is trivial. Aorta: The aortic root is normal in size and structure. Venous: The inferior vena cava is normal in size with less than 50% respiratory variability, suggesting right atrial pressure of 8 mmHg. IAS/Shunts: No atrial level shunt detected by color flow Doppler.  LEFT VENTRICLE PLAX 2D LVIDd:  2.98 cm  Diastology LVIDs:         2.21 cm  LV e' medial:    4.90 cm/s LV PW:         1.17 cm  LV E/e' medial:  15.3 LV IVS:        2.00 cm  LV e' lateral:   5.78 cm/s LVOT diam:     2.00 cm  LV E/e' lateral: 13.0 LV SV:         97 LV SV Index:   57 LVOT Area:     3.14 cm  RIGHT VENTRICLE             IVC RV Basal diam:  3.47 cm     IVC diam: 1.39 cm RV S prime:     18.70 cm/s TAPSE (M-mode): 2.2 cm LEFT ATRIUM             Index       RIGHT ATRIUM           Index LA diam:        3.50 cm 2.06 cm/m  RA Area:     11.50 cm LA Vol (A2C):   46.3 ml 27.24 ml/m RA Volume:   28.50 ml  16.77 ml/m LA Vol (A4C):   32.6 ml 19.18 ml/m LA Biplane Vol: 39.8 ml 23.41 ml/m  AORTIC VALVE LVOT Vmax:   154.00 cm/s LVOT Vmean:  112.500 cm/s LVOT VTI:    0.308 m  AORTA Ao Root diam: 3.00 cm MITRAL VALVE                TRICUSPID VALVE MV Area (PHT): 2.80 cm     TR Peak grad:   26.4 mmHg MV Decel Time: 271 msec     TR Vmax:        257.00 cm/s MV E velocity: 75.10 cm/s MV A velocity: 138.00 cm/s  SHUNTS MV E/A ratio:  0.54         Systemic VTI:  0.31 m                             Systemic Diam: 2.00 cm  Debbe Odea MD Electronically signed by Debbe Odea MD Signature Date/Time: 01/31/2021/9:43:24 AM    Final    (Echo, Carotid, EGD, Colonoscopy, ERCP)    Subjective: Seen and examined at the time of discharge.  Stable no distress.  Blood pressure improved.  Chest pain-free.  Discharge Exam: Vitals:   01/31/21 0500 01/31/21 0600  BP: (!) 140/56 (!) 166/57  Pulse: 71 71  Resp: 20 20  Temp:    SpO2: 94% 94%   Vitals:   01/31/21 0300 01/31/21 0430 01/31/21 0500 01/31/21 0600  BP: 140/61 (!) 131/47 (!) 140/56 (!) 166/57  Pulse: 66 61 71 71  Resp: 20 20 20 20   Temp:      TempSrc:      SpO2: 95% 95% 94% 94%  Weight:      Height:        General: Pt is alert, awake, not in acute distress Cardiovascular: RRR, S1/S2 +, no rubs, no gallops Respiratory: CTA bilaterally, no wheezing, no rhonchi Abdominal: Soft, NT, ND, bowel sounds + Extremities: no edema, no cyanosis    The results of significant diagnostics from this hospitalization (including imaging, microbiology, ancillary and laboratory) are listed below for reference.     Microbiology: No results found for this or any previous visit (from the past  240 hour(s)).   Labs: BNP (last 3 results) No results for input(s): BNP in the last 8760 hours. Basic Metabolic Panel: Recent Labs  Lab 01/30/21 1128 01/30/21 1707 01/31/21 0626  NA 140  --  139  K 4.1  --  3.8  CL 100  --  104  CO2 30  --  28  GLUCOSE 141*  --  162*  BUN 13  --  16  CREATININE 0.70 0.55 0.58  CALCIUM 9.9  --  9.6  MG  --   --  2.1  PHOS  --   --  3.6   Liver Function Tests: No results for input(s): AST, ALT, ALKPHOS, BILITOT, PROT, ALBUMIN in the last 168 hours. No results for input(s): LIPASE, AMYLASE in the last 168 hours. No results for input(s): AMMONIA in the last 168 hours. CBC: Recent Labs  Lab 01/30/21 1128 01/30/21 1707 01/31/21 0626  WBC 9.5 9.8 10.1  HGB 16.0* 15.6* 15.5*  HCT 49.1* 47.9* 46.6*  MCV 82.8 82.4 83.7   PLT 203 213 199   Cardiac Enzymes: No results for input(s): CKTOTAL, CKMB, CKMBINDEX, TROPONINI in the last 168 hours. BNP: Invalid input(s): POCBNP CBG: Recent Labs  Lab 01/30/21 2151 01/31/21 0831  GLUCAP 208* 135*   D-Dimer No results for input(s): DDIMER in the last 72 hours. Hgb A1c Recent Labs    01/31/21 0626  HGBA1C 7.5*   Lipid Profile No results for input(s): CHOL, HDL, LDLCALC, TRIG, CHOLHDL, LDLDIRECT in the last 72 hours. Thyroid function studies No results for input(s): TSH, T4TOTAL, T3FREE, THYROIDAB in the last 72 hours.  Invalid input(s): FREET3 Anemia work up No results for input(s): VITAMINB12, FOLATE, FERRITIN, TIBC, IRON, RETICCTPCT in the last 72 hours. Urinalysis No results found for: COLORURINE, APPEARANCEUR, LABSPEC, PHURINE, GLUCOSEU, HGBUR, BILIRUBINUR, KETONESUR, PROTEINUR, UROBILINOGEN, NITRITE, LEUKOCYTESUR Sepsis Labs Invalid input(s): PROCALCITONIN,  WBC,  LACTICIDVEN Microbiology No results found for this or any previous visit (from the past 240 hour(s)).   Time coordinating discharge: Over 30 minutes  SIGNED:   Tresa Moore, MD  [facility] 01/31/2021, 2:21 PM Pager   If 7PM-7AM, please contact night-coverage

## 2021-02-01 ENCOUNTER — Telehealth: Payer: Self-pay

## 2021-02-01 NOTE — Telephone Encounter (Signed)
Transition Care Management Unsuccessful Follow-up Telephone Call  Date of discharge and from where:  01/31/21 from [facility]  Attempts:  1st Attempt  Reason for unsuccessful TCM follow-up call:  Left voice message    

## 2021-02-02 NOTE — Telephone Encounter (Signed)
Transition Care Management Unsuccessful Follow-up Telephone Call  Date of discharge and from where:  01/31/21 from [facility]  Attempts:  2nd Attempt  Reason for unsuccessful TCM follow-up call:  Unable to reach patient

## 2021-02-05 NOTE — Telephone Encounter (Signed)
Transition Care Management Unsuccessful Follow-up Telephone Call  Date of discharge and from where:  01/31/21 from [facility]  Attempts:  3rd Attempt  Reason for unsuccessful TCM follow-up call:  Left voice message. HFU scheduled.

## 2021-02-08 ENCOUNTER — Encounter: Payer: Self-pay | Admitting: Family Medicine

## 2021-02-08 ENCOUNTER — Other Ambulatory Visit: Payer: Self-pay

## 2021-02-08 ENCOUNTER — Ambulatory Visit (INDEPENDENT_AMBULATORY_CARE_PROVIDER_SITE_OTHER): Payer: Medicare Other | Admitting: Family Medicine

## 2021-02-08 VITALS — BP 140/70 | HR 76 | Temp 98.4°F | Ht 65.0 in | Wt 145.4 lb

## 2021-02-08 DIAGNOSIS — I10 Essential (primary) hypertension: Secondary | ICD-10-CM

## 2021-02-08 DIAGNOSIS — E119 Type 2 diabetes mellitus without complications: Secondary | ICD-10-CM | POA: Diagnosis not present

## 2021-02-08 DIAGNOSIS — R2689 Other abnormalities of gait and mobility: Secondary | ICD-10-CM | POA: Diagnosis not present

## 2021-02-08 DIAGNOSIS — H7191 Unspecified cholesteatoma, right ear: Secondary | ICD-10-CM | POA: Diagnosis not present

## 2021-02-08 LAB — BASIC METABOLIC PANEL
BUN: 13 mg/dL (ref 6–23)
CO2: 30 mEq/L (ref 19–32)
Calcium: 10.1 mg/dL (ref 8.4–10.5)
Chloride: 100 mEq/L (ref 96–112)
Creatinine, Ser: 0.72 mg/dL (ref 0.40–1.20)
GFR: 73.08 mL/min (ref >60.00)
Glucose, Bld: 130 mg/dL — ABNORMAL HIGH (ref 70–99)
Potassium: 4.3 mEq/L (ref 3.5–5.1)
Sodium: 138 mEq/L (ref 135–145)

## 2021-02-08 MED ORDER — LISINOPRIL 10 MG PO TABS: 10.0000 mg | ORAL_TABLET | Freq: Every day | ORAL | 1 refills | Status: DC

## 2021-02-08 NOTE — Assessment & Plan Note (Signed)
A1c was adequate for her age.  She will continue Tradjenta 5 mg once daily.

## 2021-02-08 NOTE — Assessment & Plan Note (Signed)
Blood pressure has seemed to improve since she was hospitalized.  Undetermined cause for why her blood pressure spiked.  We will have her continue on lisinopril 10 mg once daily.  We will check a BMP today.  She will see cardiology next week.  If she has recurrent shortness of breath or if she develops chest pain she will seek medical attention.

## 2021-02-08 NOTE — Progress Notes (Signed)
Marikay Alar, MD Phone: [phone]  Laurie Patel is a 87 y.o. female who presents today for follow-up.  Hypertension/shortness of breath: Patient noted palpitations the morning of 01/30/2021.  Started after she woke up and improved with rest.  She then felt short of breath later in the morning.  She noted no chest pain.  She subsequently went to urgent care and then was sent to the emergency department for evaluation.  Her blood pressure was up to 200 systolically.  Her troponin did trend up to 35.  Chest x-ray was reassuring.  She notes they increased her lisinopril to 10 mg once daily and monitor her.  She notes typically her blood pressure is 130/60 though she has not checked it since she got out of the hospital.  She has had no chest pain or shortness of breath since discharge.  She did note a few palpitations last night.  She noted no increase salt intake or caffeine intake.  She sees cardiology next week.  Chronic balance issues: This is an ongoing issue.  Her recent CT scan had redemonstrated sequelae of chronic right otitis media and possible cholesteatoma.  This was evaluated better on a CT temporal bone in 2019 by ENT.  Diabetes: 30-day average of 135 on her freestyle libre.  Social History   Tobacco Use  Smoking Status Never  Smokeless Tobacco Never    Current Outpatient Medications on File Prior to Visit  Medication Sig Dispense Refill   aspirin 81 MG EC tablet Take 81 mg by mouth daily. Swallow whole.     cholecalciferol (VITAMIN D3) 25 MCG (1000 UNIT) tablet Take 1,000 Units by mouth daily.     Continuous Blood Gluc Receiver (FREESTYLE LIBRE 14 DAY READER) DEVI Use at least every 8 hours to check glucose. Dx code E11.9. 1 each 0   Continuous Blood Gluc Sensor (FREESTYLE LIBRE 14 DAY SENSOR) MISC Apply every 14 days. Dx code E11.9. 6 each 1   linagliptin (TRADJENTA) 5 MG TABS tablet Take 1 tablet (5 mg total) by mouth once a week. (Patient taking differently: Take 5 mg by  mouth daily.) 13 tablet 3   Multiple Vitamins-Calcium (ONE-A-DAY WOMENS PO) Take 1 tablet by mouth daily.     No current facility-administered medications on file prior to visit.     ROS see history of present illness  Objective  Physical Exam Vitals:   02/08/21 1030 02/08/21 1102  BP: (!) 160/70 140/70  Pulse: 76   Temp: 98.4 F (36.9 C)   SpO2: 96%     BP Readings from Last 3 Encounters:  02/08/21 140/70  01/31/21 (!) 166/57  01/30/21 (!) 204/83   Wt Readings from Last 3 Encounters:  02/08/21 145 lb 6.4 oz (66 kg)  01/30/21 140 lb (63.5 kg)  01/02/21 147 lb 3.2 oz (66.8 kg)    Physical Exam Constitutional:      General: She is not in acute distress.    Appearance: She is not diaphoretic.  Cardiovascular:     Rate and Rhythm: Normal rate and regular rhythm.     Heart sounds: Normal heart sounds.  Pulmonary:     Effort: Pulmonary effort is normal.     Breath sounds: Normal breath sounds.  Musculoskeletal:     Right lower leg: No edema.     Left lower leg: No edema.  Skin:    General: Skin is warm and dry.  Neurological:     Mental Status: She is alert.  Assessment/Plan: Please see individual problem list.  Problem List Items Addressed This Visit     Balance problem    Chronic issue.  I wonder if the findings in her right ear on CT imaging are playing a role.  We will try to get her back into see ENT for further evaluation.      Diabetes (HCC)    A1c was adequate for her age.  She will continue Tradjenta 5 mg once daily.      Relevant Medications   lisinopril (ZESTRIL) 10 MG tablet   HTN (hypertension) - Primary    Blood pressure has seemed to improve since she was hospitalized.  Undetermined cause for why her blood pressure spiked.  We will have her continue on lisinopril 10 mg once daily.  We will check a BMP today.  She will see cardiology next week.  If she has recurrent shortness of breath or if she develops chest pain she will seek medical  attention.      Relevant Medications   lisinopril (ZESTRIL) 10 MG tablet   Other Relevant Orders   Basic Metabolic Panel (BMET)   Other Visit Diagnoses     Cholesteatoma of right ear       Relevant Orders   Ambulatory referral to ENT       Return in about 3 months (around 05/11/2021) for htn.  This visit occurred during the SARS-CoV-2 public health emergency.  Safety protocols were in place, including screening questions prior to the visit, additional usage of staff PPE, and extensive cleaning of exam room while observing appropriate contact time as indicated for disinfecting solutions.    Marikay Alar, MD [facility]

## 2021-02-08 NOTE — Patient Instructions (Signed)
Nice to see you. Please continue with lisinopril 10 mg once daily. We will get lab work today and contact you with results. Please see cardiology as planned next week. We are going to try to get you into see ENT again for your right ear.

## 2021-02-08 NOTE — Assessment & Plan Note (Signed)
Chronic issue.  I wonder if the findings in her right ear on CT imaging are playing a role.  We will try to get her back into see ENT for further evaluation.

## 2021-02-15 ENCOUNTER — Encounter: Payer: Self-pay | Admitting: Cardiology

## 2021-02-15 ENCOUNTER — Ambulatory Visit: Payer: Medicare Other | Admitting: Podiatry

## 2021-02-15 ENCOUNTER — Ambulatory Visit: Payer: Medicare Other | Admitting: Cardiology

## 2021-02-15 ENCOUNTER — Other Ambulatory Visit: Payer: Self-pay

## 2021-02-15 VITALS — BP 200/68 | HR 93 | Ht 65.5 in | Wt 143.0 lb

## 2021-02-15 DIAGNOSIS — I517 Cardiomegaly: Secondary | ICD-10-CM | POA: Diagnosis not present

## 2021-02-15 DIAGNOSIS — I10 Essential (primary) hypertension: Secondary | ICD-10-CM

## 2021-02-15 DIAGNOSIS — R0602 Shortness of breath: Secondary | ICD-10-CM | POA: Diagnosis not present

## 2021-02-15 NOTE — Progress Notes (Signed)
Cardiology Office Note:    Date:  02/15/2021   ID:  Laurie Patel, DOB 30-Dec-1927, MRN [id number]  PCP:  Glori Luis, MD   [facility] Providers Cardiologist:  None     Referring MD: Glori Luis, MD   Chief Complaint  Patient presents with   New Patient (Initial Visit)    Referred by ED -- Chest pain and Dizziness. Patient states she went to the ED for SOB -- Daughter took her to urgent care. Patient states she has no balance -- which started with ear pain. Paitent denies chest pain currently. Meds reviewed verbally with patient.     History of Present Illness:    Laurie Patel is a 87 y.o. female with a hx of hypertension, diabetes who presents due to shortness of breath and dizziness.   She was at home about 2 weeks ago when she suddenly felt short of breath.  Her daughter took her to urgent care, checked her blood pressures, which were elevated in the 200s.  She was then advised to go to the emergency room.  She was seen in the ED on 01/31/2021 with symptoms of shortness of breath systolic blood pressure was also significantly elevated in the 200s.  Troponins were 11, 33, EKG showed normal sinus rhythm with a left bundle branch block which is chronic.  Symptoms improved in the ED, lisinopril was increased from 5 mg to 10 mg daily upon discharge.  She checks her blood pressure frequently at home, systolic usually in the 120s.  echocardiogram was obtained 01/2021 showing preserved ejection fraction, asymmetric septal hypertrophy up to 1.5 cm, no LVOT obstruction, mild aortic valve sclerosis.  She states feeling well since hospital discharge, denies shortness of breath, able to perform activities of daily living such as walking without chest pain.  She uses a walker due to chronic dizziness and balance issues.  Past Medical History:  Diagnosis Date   Ankle fracture 10/14/2017   Heart murmur    PNA (pneumonia) 02/16/2018    Past Surgical History:  Procedure  Laterality Date   APPENDECTOMY  1986   EYE SURGERY Bilateral 1995 and 1999   Lens implants   WRIST SURGERY Left 1986   ORIF     Current Medications: Current Meds  Medication Sig   aspirin 81 MG EC tablet Take 81 mg by mouth daily. Swallow whole.   cholecalciferol (VITAMIN D3) 25 MCG (1000 UNIT) tablet Take 1,000 Units by mouth daily.   Continuous Blood Gluc Receiver (FREESTYLE LIBRE 14 DAY READER) DEVI Use at least every 8 hours to check glucose. Dx code E11.9.   Continuous Blood Gluc Sensor (FREESTYLE LIBRE 14 DAY SENSOR) MISC Apply every 14 days. Dx code E11.9.   linagliptin (TRADJENTA) 5 MG TABS tablet Take 1 tablet (5 mg total) by mouth once a week.   lisinopril (ZESTRIL) 10 MG tablet Take 1 tablet (10 mg total) by mouth daily.   Multiple Vitamins-Calcium (ONE-A-DAY WOMENS PO) Take 1 tablet by mouth daily.     Allergies:   Augmentin [amoxicillin-pot clavulanate] and Prednisone   Social History   Socioeconomic History   Marital status: Widowed    Spouse name: Not on file   Number of children: Not on file   Years of education: Not on file   Highest education level: Not on file  Occupational History   Not on file  Tobacco Use   Smoking status: Never   Smokeless tobacco: Never  Vaping Use   Vaping  Use: Never used  Substance and Sexual Activity   Alcohol use: No    Alcohol/week: 0.0 standard drinks   Drug use: No   Sexual activity: Never  Other Topics Concern   Not on file  Social History Narrative   Lives in [place]    Owns a [employer]    1 dog   Social Determinants of Health   Financial Resource Strain: Low Risk    Difficulty of Paying Living Expenses: Not very hard  Food Insecurity: No Food Insecurity   Worried About Programme researcher, broadcasting/film/video in the Last Year: Never true   Ran Out of Food in the Last Year: Never true  Transportation Needs: No Transportation Needs   Lack of Transportation (Medical): No   Lack of Transportation (Non-Medical): No  Physical  Activity: Not on file  Stress: No Stress Concern Present   Feeling of Stress : Not at all  Social Connections: Unknown   Frequency of Communication with Friends and Family: More than three times a week   Frequency of Social Gatherings with Friends and Family: More than three times a week   Attends Religious Services: Not on file   Active Member of Clubs or Organizations: Not on file   Attends Banker Meetings: Not on file   Marital Status: Widowed     Family History: The patient's family history includes COPD (age of onset: 4) in her father; Cancer (age of onset: 38) in her mother; Early death in her paternal grandmother; Heart disease in her maternal grandfather, maternal grandmother, and paternal grandfather; Heart disease (age of onset: 35) in her brother; Heart disease (age of onset: 57) in her father; Obesity in her paternal grandfather.  ROS:   Please see the history of present illness.     All other systems reviewed and are negative.  EKGs/Labs/Other Studies Reviewed:    The following studies were reviewed today:   EKG:  EKG is  ordered today.  The ekg ordered today demonstrates sinus rhythm, left bundle branch block  Recent Labs: 04/09/2020: ALT 15 01/31/2021: Hemoglobin 15.5; Magnesium 2.1; Platelets 199 02/08/2021: BUN 13; Creatinine, Ser 0.72; Potassium 4.3; Sodium 138  Recent Lipid Panel    Component Value Date/Time   CHOL 173 03/14/2020 0941   TRIG 117.0 03/14/2020 0941   HDL 36.20 (L) 03/14/2020 0941   CHOLHDL 5 03/14/2020 0941   VLDL 23.4 03/14/2020 0941   LDLCALC 114 (H) 03/14/2020 0941   LDLDIRECT 147.0 03/21/2016 0926     Risk Assessment/Calculations:          Physical Exam:    VS:  BP (!) 200/68 (BP Location: Left Arm, Patient Position: Sitting, Cuff Size: Normal)   Pulse 93   Ht 5' 5.5" (1.664 m)   Wt 143 lb (64.9 kg)   SpO2 96%   BMI 23.43 kg/m     Wt Readings from Last 3 Encounters:  02/15/21 143 lb (64.9 kg)  02/08/21 145  lb 6.4 oz (66 kg)  01/30/21 140 lb (63.5 kg)     GEN:  Well nourished, well developed in no acute distress HEENT: Normal NECK: No JVD; No carotid bruits LYMPHATICS: No lymphadenopathy CARDIAC: RRR, faint systolic murmur RESPIRATORY:  Clear to auscultation without rales, wheezing or rhonchi  ABDOMEN: Soft, non-tender, non-distended MUSCULOSKELETAL:  No edema; No deformity  SKIN: Warm and dry NEUROLOGIC:  Alert and oriented x 3 PSYCHIATRIC:  Normal affect   ASSESSMENT:    1. SOB (shortness of breath)  2. Primary hypertension   3. LVH (left ventricular hypertrophy)    PLAN:    In order of problems listed above:  Shortness of breath, likely age-related, deconditioning.  Denies chest pain, not consistent with angina equivalent.  Echo with preserved ejection fraction, impaired relaxation, EF 55 to 60%. Hypertension, BP elevated, usually controlled at home with systolic in the 120s.  Patient has a component of whitecoat syndrome.  Continue increased dose of lisinopril 10 mg daily. Asymmetric basal septal LVH, no LVOT obstruction.  We will not pursue CMR for slow intervention if diagnosed with HCM will be performed due to patient's age.  Follow-up in 6 months.     Medication Adjustments/Labs and Tests Ordered: Current medicines are reviewed at length with the patient today.  Concerns regarding medicines are outlined above.  Orders Placed This Encounter  Procedures   EKG 12-Lead    No orders of the defined types were placed in this encounter.   Patient Instructions  Medication Instructions:  Your physician recommends that you continue on your current medications as directed. Please refer to the Current Medication list given to you today.  *If you need a refill on your cardiac medications before your next appointment, please call your pharmacy*   Lab Work: None ordered If you have labs (blood work) drawn today and your tests are completely normal, you will receive your  results only by: MyChart Message (if you have MyChart) OR A paper copy in the mail If you have any lab test that is abnormal or we need to change your treatment, we will call you to review the results.   Testing/Procedures: None ordered   Follow-Up: At [facility], you and your health needs are our priority.  As part of our continuing mission to provide you with exceptional heart care, we have created designated Provider Care Teams.  These Care Teams include your primary Cardiologist (physician) and Advanced Practice Providers (APPs -  Physician Assistants and Nurse Practitioners) who all work together to provide you with the care you need, when you need it.  We recommend signing up for the patient portal called "MyChart".  Sign up information is provided on this After Visit Summary.  MyChart is used to connect with patients for Virtual Visits (Telemedicine).  Patients are able to view lab/test results, encounter notes, upcoming appointments, etc.  Non-urgent messages can be sent to your provider as well.   To learn more about what you can do with MyChart, go to [email or web address].    Your next appointment:   6 month(s)  The format for your next appointment:   In Person  Provider:   You may see Dr.Agbor-Etang or one of the following Advanced Practice Providers on your designated Care Team:   Nicolasa Ducking, NP Eula Listen, PA-C Marisue Ivan, PA-C Cadence [place], [place]   Other Instructions    Signed, Debbe Odea, MD  02/15/2021 12:22 PM    [facility]

## 2021-02-15 NOTE — Patient Instructions (Signed)
Medication Instructions:  Your physician recommends that you continue on your current medications as directed. Please refer to the Current Medication list given to you today.  *If you need a refill on your cardiac medications before your next appointment, please call your pharmacy*   Lab Work: None ordered If you have labs (blood work) drawn today and your tests are completely normal, you will receive your results only by: MyChart Message (if you have MyChart) OR A paper copy in the mail If you have any lab test that is abnormal or we need to change your treatment, we will call you to review the results.   Testing/Procedures: None ordered   Follow-Up: At [facility], you and your health needs are our priority.  As part of our continuing mission to provide you with exceptional heart care, we have created designated Provider Care Teams.  These Care Teams include your primary Cardiologist (physician) and Advanced Practice Providers (APPs -  Physician Assistants and Nurse Practitioners) who all work together to provide you with the care you need, when you need it.  We recommend signing up for the patient portal called "MyChart".  Sign up information is provided on this After Visit Summary.  MyChart is used to connect with patients for Virtual Visits (Telemedicine).  Patients are able to view lab/test results, encounter notes, upcoming appointments, etc.  Non-urgent messages can be sent to your provider as well.   To learn more about what you can do with MyChart, go to [email or web address].    Your next appointment:   6 month(s)  The format for your next appointment:   In Person  Provider:   You may see Dr. Agbor-Etang or one of the following Advanced Practice Providers on your designated Care Team:   Christopher Berge, NP Ryan Dunn, PA-C Jacquelyn Visser, PA-C Cadence [place], PA-C   Other Instructions   

## 2021-02-22 ENCOUNTER — Ambulatory Visit: Payer: Medicare Other | Admitting: Podiatry

## 2021-02-22 ENCOUNTER — Encounter: Payer: Self-pay | Admitting: Podiatry

## 2021-02-22 ENCOUNTER — Other Ambulatory Visit: Payer: Self-pay

## 2021-02-22 DIAGNOSIS — E119 Type 2 diabetes mellitus without complications: Secondary | ICD-10-CM

## 2021-02-22 DIAGNOSIS — M79676 Pain in unspecified toe(s): Secondary | ICD-10-CM | POA: Diagnosis not present

## 2021-02-22 DIAGNOSIS — B351 Tinea unguium: Secondary | ICD-10-CM

## 2021-02-22 NOTE — Progress Notes (Signed)
This patient returns to my office for at risk foot care.  This patient requires this care by a professional since this patient will be at risk due to having diabetes.   This patient is unable to cut nails herself since the patient cannot reach her nails.These nails are painful walking and wearing shoes.  This patient presents for at risk foot care today.  General Appearance  Alert, conversant and in no acute stress.  Vascular  Dorsalis pedis and posterior tibial  pulses are weakly  palpable  bilaterally.  Capillary return is within normal limits  bilaterally. Temperature is within normal limits  bilaterally.  Neurologic  Senn-Weinstein monofilament wire test within normal limits  bilaterally. Muscle power within normal limits bilaterally.  Nails Thick disfigured discolored nails with subungual debris  from hallux to fifth toenails left and 2-5 toenails right..  Absence of right hallux nail. No evidence of bacterial infection or drainage bilaterally.  Orthopedic  No limitations of motion  feet .  No crepitus or effusions noted.  No bony pathology or digital deformities noted.  Skin  normotropic skin with no porokeratosis noted bilaterally.  No signs of infections or ulcers noted.     Onychomycosis  Pain in right toes  Pain in left toes  Consent was obtained for treatment procedures.   Mechanical debridement of nails 1-5  Left and 2-5 right  performed with a nail nipper with the exception right hallux toenail..  Filed with dremel without incident.     Return office visit  10 weeks                    Told patient to return for periodic foot care and evaluation due to potential at risk complications.   Ellison Rieth DPM  

## 2021-03-23 ENCOUNTER — Telehealth: Payer: Self-pay | Admitting: Family Medicine

## 2021-03-23 NOTE — Telephone Encounter (Signed)
Rejection Reason - Patient was No Show" Rowe Robert said on Mar 23, 2021 8:57 AM   Appt was on 03/13/2021 at 08:45 am   Msg from [place] ent

## 2021-03-23 NOTE — Telephone Encounter (Signed)
Noted. Please follow-up with the patient to encourage her to reschedule her follow-up with ENT for her ear issue.

## 2021-03-23 NOTE — Telephone Encounter (Signed)
I called and spoke with the patient and Laurie Patel stated Laurie Patel cancelled the appointment because it was no longer needed, Laurie Patel stated Laurie Patel cancelled it 1 month ago, I informed her that Laurie Patel should call the ENT office because I don't know their polity for no show and le them no and Laurie Patel agreed to do that.  Kameren Baade,cma

## 2021-03-26 DIAGNOSIS — Z0279 Encounter for issue of other medical certificate: Secondary | ICD-10-CM

## 2021-04-19 ENCOUNTER — Telehealth: Payer: Self-pay | Admitting: Family Medicine

## 2021-04-19 NOTE — Telephone Encounter (Signed)
Patient dropped off Patient Assistance program paper work. Papers are up front in Dr. Purvis Sheffield color folder.

## 2021-04-20 NOTE — Telephone Encounter (Signed)
Patient dropped off Patient Assistance program paper work. Papers are up front in Dr. Purvis Sheffield color folder.  I put the form in the sign basket.  Carrol Bondar,cma

## 2021-04-23 NOTE — Telephone Encounter (Signed)
Signed. Please fill in our information on the first page.

## 2021-04-23 NOTE — Telephone Encounter (Signed)
The form was completed and faxed to [facility] cares and a fax confirmation was given.  Harvie Morua,cma

## 2021-05-03 ENCOUNTER — Encounter: Payer: Self-pay | Admitting: Podiatry

## 2021-05-03 ENCOUNTER — Other Ambulatory Visit: Payer: Self-pay

## 2021-05-03 ENCOUNTER — Ambulatory Visit: Payer: Medicare Other | Admitting: Podiatry

## 2021-05-03 DIAGNOSIS — M79676 Pain in unspecified toe(s): Secondary | ICD-10-CM | POA: Diagnosis not present

## 2021-05-03 DIAGNOSIS — B351 Tinea unguium: Secondary | ICD-10-CM | POA: Diagnosis not present

## 2021-05-03 DIAGNOSIS — E119 Type 2 diabetes mellitus without complications: Secondary | ICD-10-CM

## 2021-05-03 NOTE — Progress Notes (Signed)
This patient returns to my office for at risk foot care.  This patient requires this care by a professional since this patient will be at risk due to having diabetes.   This patient is unable to cut nails herself since the patient cannot reach her nails.These nails are painful walking and wearing shoes.  This patient presents for at risk foot care today.  General Appearance  Alert, conversant and in no acute stress.  Vascular  Dorsalis pedis and posterior tibial  pulses are weakly  palpable  bilaterally.  Capillary return is within normal limits  bilaterally. Temperature is within normal limits  bilaterally.  Neurologic  Senn-Weinstein monofilament wire test within normal limits  bilaterally. Muscle power within normal limits bilaterally.  Nails Thick disfigured discolored nails with subungual debris  from hallux to fifth toenails left and 2-5 toenails right..  Absence of right hallux nail. No evidence of bacterial infection or drainage bilaterally.  Orthopedic  No limitations of motion  feet .  No crepitus or effusions noted.  No bony pathology or digital deformities noted.  Skin  normotropic skin with no porokeratosis noted bilaterally.  No signs of infections or ulcers noted.     Onychomycosis  Pain in right toes  Pain in left toes  Consent was obtained for treatment procedures.   Mechanical debridement of nails 1-5  Left and 2-5 right  performed with a nail nipper with the exception right hallux toenail..  Filed with dremel without incident.     Return office visit  10 weeks                    Told patient to return for periodic foot care and evaluation due to potential at risk complications.   Deandrea Vanpelt DPM  

## 2021-05-04 ENCOUNTER — Telehealth: Payer: Self-pay

## 2021-05-04 NOTE — Telephone Encounter (Signed)
I received a fax from [facility] cares program and I called and informed the patient to call [facility] cares and I gave her the phone number [phone].  Sabas Frett,cma

## 2021-05-07 NOTE — Telephone Encounter (Signed)
Patient called and said she would like a copy of papers when sent out. Please call when ready

## 2021-05-15 ENCOUNTER — Telehealth: Payer: Self-pay | Admitting: Family Medicine

## 2021-05-15 NOTE — Telephone Encounter (Signed)
[facility] care form placed in sign basket.  Valley Ke,cma

## 2021-05-15 NOTE — Telephone Encounter (Signed)
Patient dropped off [facility] [employer] Patient Assistance Program form to be completed by Dr Birdie Sons. Forms are up front in Dr Purvis Sheffield color folder.

## 2021-05-29 NOTE — Telephone Encounter (Signed)
Signed. Please fill in our office information and fax to submit this for the patient.

## 2021-05-30 NOTE — Telephone Encounter (Signed)
I faxed the [facility] cares for today. Fax confirmation given.  Draeden Kellman,cma

## 2021-06-05 DIAGNOSIS — H16223 Keratoconjunctivitis sicca, not specified as Sjogren's, bilateral: Secondary | ICD-10-CM | POA: Diagnosis not present

## 2021-06-05 LAB — HM DIABETES EYE EXAM

## 2021-07-04 ENCOUNTER — Telehealth: Payer: Self-pay | Admitting: Cardiology

## 2021-07-04 NOTE — Telephone Encounter (Signed)
Pt c/o Shortness Of Breath: STAT if SOB developed within the last 24 hours or pt is noticeably SOB on the phone  1. Are you currently SOB (can you hear that pt is SOB on the phone)? No   2. How long have you been experiencing SOB? A few days   3. Are you SOB when sitting or when up moving around? Exertion can recovery in 5-10 min with rest   4. Are you currently experiencing any other symptoms?  Freezing cold

## 2021-07-05 NOTE — Telephone Encounter (Signed)
Left voicemail message to call back  

## 2021-07-05 NOTE — Telephone Encounter (Signed)
Spoke with patient and she reports some shortness of breath with activity. She reports not taking her tradjenta and her symptoms resolved. Encouraged her to contact PCP to let them know of her symptoms she was having so they can advise if she should continue. We spoke about deconditioning. She acknowledges that she has not been able to walk like she has done in the past. Patient said she is coming in next month and will discuss at that visit. I did assist with getting sooner appointment and she was agreeable. Instructed her to call back if her symptoms worsen and contact PCP about that medication. She verbalized understanding of our conversation, agreement with plan, and had no further questions at this time.

## 2021-07-10 ENCOUNTER — Emergency Department: Payer: Medicare Other

## 2021-07-10 ENCOUNTER — Ambulatory Visit: Payer: Medicare Other | Admitting: Family Medicine

## 2021-07-10 ENCOUNTER — Encounter: Payer: Self-pay | Admitting: Emergency Medicine

## 2021-07-10 ENCOUNTER — Emergency Department
Admission: EM | Admit: 2021-07-10 | Discharge: 2021-07-10 | Disposition: A | Discharge status: Home / Self Care | Payer: Medicare Other | Attending: Emergency Medicine | Admitting: Emergency Medicine

## 2021-07-10 ENCOUNTER — Other Ambulatory Visit: Payer: Self-pay

## 2021-07-10 DIAGNOSIS — E119 Type 2 diabetes mellitus without complications: Secondary | ICD-10-CM | POA: Diagnosis not present

## 2021-07-10 DIAGNOSIS — R0602 Shortness of breath: Secondary | ICD-10-CM | POA: Diagnosis not present

## 2021-07-10 DIAGNOSIS — I517 Cardiomegaly: Secondary | ICD-10-CM | POA: Diagnosis not present

## 2021-07-10 DIAGNOSIS — R002 Palpitations: Secondary | ICD-10-CM | POA: Diagnosis not present

## 2021-07-10 DIAGNOSIS — I10 Essential (primary) hypertension: Secondary | ICD-10-CM | POA: Insufficient documentation

## 2021-07-10 LAB — BASIC METABOLIC PANEL
Anion gap: 10 (ref 5–15)
BUN: 13 mg/dL (ref 8–23)
CO2: 28 mmol/L (ref 22–32)
Calcium: 9.6 mg/dL (ref 8.9–10.3)
Chloride: 99 mmol/L (ref 98–111)
Creatinine, Ser: 0.74 mg/dL (ref 0.44–1.00)
GFR, Estimated: >60 mL/min (ref >60)
Glucose, Bld: 207 mg/dL — ABNORMAL HIGH (ref 70–99)
Potassium: 3.9 mmol/L (ref 3.5–5.1)
Sodium: 137 mmol/L (ref 135–145)

## 2021-07-10 LAB — CBC
HCT: 48.1 % — ABNORMAL HIGH (ref 36.0–46.0)
Hemoglobin: 15.7 g/dL — ABNORMAL HIGH (ref 12.0–15.0)
MCH: 27.4 pg (ref 26.0–34.0)
MCHC: 32.6 g/dL (ref 30.0–36.0)
MCV: 83.9 fL (ref 80.0–100.0)
Platelets: 233 10*3/uL (ref 150–400)
RBC: 5.73 MIL/uL — ABNORMAL HIGH (ref 3.87–5.11)
RDW: 13.2 % (ref 11.5–15.5)
WBC: 9.7 10*3/uL (ref 4.0–10.5)
nRBC: 0 % (ref 0.0–0.2)

## 2021-07-10 LAB — TSH: TSH: 3.15 u[IU]/mL (ref 0.350–4.500)

## 2021-07-10 LAB — TROPONIN I (HIGH SENSITIVITY)
Troponin I (High Sensitivity): 14 ng/L (ref <18)
Troponin I (High Sensitivity): 14 ng/L (ref <18)

## 2021-07-10 LAB — T4, FREE: Free T4: 0.82 ng/dL (ref 0.61–1.12)

## 2021-07-10 LAB — D-DIMER, QUANTITATIVE: D-Dimer, Quant: 0.47 ug/mL-FEU (ref 0.00–0.50)

## 2021-07-10 MED ORDER — SODIUM CHLORIDE 0.9 % IV BOLUS: 1000.0000 mL | Freq: Once | INTRAVENOUS | Status: DC

## 2021-07-10 NOTE — ED Triage Notes (Signed)
Pt to ED from home c/o SOB and palpitations.  States was doing dishes and then noticed her heart was racing and felt the palpitations, checked BP and heart rate at home and showed HR of 113.  States took 3 baby ASA at home.  Denies pain, states still SOB, denies n/v/d.  Pt A&OX4, chest rise even and unlabored, skin WNL and in NAD at this time.

## 2021-07-10 NOTE — ED Provider Notes (Signed)
[facility] Provider Note    Event Date/Time   First MD Initiated Contact with Patient 07/10/21 1940     (approximate)   History   Shortness of Breath and Palpitations   HPI  Laurie Patel is a 88 y.o. female   with past medical history hypertension diabetes who presents with palpitations.  Patient was in her normal state of health when she went to pick up a cookie from the ground when she stood up she felt like her heart was racing.  She had some mild dyspnea associated with it.  Denies chest pain.  Since that time she has felt like her heart is beating "stronger".  She denies any abdominal pain nausea vomiting.  Has not had any recent illness fevers chills.  No urinary symptoms.  Patient has been told she had an irregular heart rate in the past.  Denies lower extremity edema.     Past Medical History:  Diagnosis Date   Ankle fracture 10/14/2017   Heart murmur    PNA (pneumonia) 02/16/2018    Patient Active Problem List   Diagnosis Date Noted   Chest pain 01/30/2021   Constipation 04/18/2020   Elevated hemoglobin (HCC) 04/18/2020   Bilateral hand numbness 03/14/2020   Skin lesions 11/09/2019   Ear discomfort, right 05/12/2018   Dermatitis, eczematoid 02/16/2018   H/O: osteoarthritis 02/16/2018   Irregular cardiac rhythm 02/16/2018   Low back pain 02/16/2018   Nonspecific abnormal finding in stool contents 02/16/2018   Balance problem 10/14/2017   Palpitations 06/21/2016   Hyperlipidemia 03/21/2016   Allergic rhinitis 03/21/2016   Diabetes ([place]) 09/14/2015   HTN (hypertension) 08/27/2014   Breathlessness on exertion 10/05/2009   OP (osteoporosis) 09/04/2009     Physical Exam  Triage Vital Signs: ED Triage Vitals [07/10/21 1932]  Enc Vitals Group     BP (!) 202/106     Pulse Rate (!) 114     Resp 17     Temp 97.9 F (36.6 C)     Temp Source Oral     SpO2 97 %     Weight 141 lb (64 kg)     Height 5' (1.524 m)     Head Circumference       Peak Flow      Pain Score 0     Pain Loc      Pain Edu?      Excl. in [place]?     Most recent vital signs: Vitals:   07/10/21 2130 07/10/21 2200  BP: (!) 165/84 (!) 150/84  Pulse: 95 94  Resp: (!) 21 20  Temp:    SpO2: 94% 97%     General: Awake, no distress.  CV:  Good peripheral perfusion.  1+ pitting edema bilaterally Resp:  Normal effort.  Abd:  No distention.  Soft nontender throughout Neuro:             Awake, Alert, Oriented x 3  Other:  Mildly tachycardic   ED Results / Procedures / Treatments  Labs (all labs ordered are listed, but only abnormal results are displayed) Labs Reviewed  BASIC METABOLIC PANEL - Abnormal; Notable for the following components:      Result Value   Glucose, Bld 207 (*)    All other components within normal limits  CBC - Abnormal; Notable for the following components:   RBC 5.73 (*)    Hemoglobin 15.7 (*)    HCT 48.1 (*)    All other  components within normal limits  D-DIMER, QUANTITATIVE  TSH  T4, FREE  TROPONIN I (HIGH SENSITIVITY)  TROPONIN I (HIGH SENSITIVITY)     EKG  Sinus tachycardia, left bundle branch block, first-degree AV block, occasional PVCs, no acute ischemic changes   RADIOLOGY Chest x-ray interpreted by myself, mild pulmonary vascular congestion agree with radiology report   PROCEDURES:  Critical Care performed: No  .1-3 Lead EKG Interpretation Performed by: Rada Hay, MD Authorized by: Rada Hay, MD     Interpretation: normal     ECG rate assessment: tachycardic     Rhythm: sinus tachycardia     Ectopy: none     Conduction: abnormal    The patient is on the cardiac monitor to evaluate for evidence of arrhythmia and/or significant heart rate changes.   MEDICATIONS ORDERED IN ED: Medications - No data to display   IMPRESSION / MDM / [place] / ED COURSE  I reviewed the triage vital signs and the nursing notes.                              Differential  diagnosis includes, but is not limited to, arrhythmia, PVCs, SVT, atrial fibrillation, pulmonary embolism, metabolic abnormality, infection  88 year old female presenting with palpitations.  Started today, she really had no other associated symptoms including shortness of breath or chest pain.  She is initially tachycardic here, on EKG review looks to be sinus tachycardia with some PVCs, first-degree AV block.  Similar to prior EKG.  She does have some lower extremity edema and on x-ray there is mild pulmonary vascular congestion, does not carry a diagnosis of CHF, last echo reviewed by myself shows EF is normal.  Reviewed patient's blood work.  She has 2 negative troponins electrolytes are all normal no leukocytosis.  Given the tachycardia I did check her thyroid function studies which are normal, TSH and free T4.  With the palpitations and tachycardia without explanation did also check a D-dimer to screen for PE which is negative and she has no risk factors so feel that PE is occluded.  Patient was observed for period time in the emergency department and her tachycardia resolved.  Unclear what the etiology of her palpitations was.  I advised that she follow-up with her cardiologist who she has an appointment with in the next several weeks.  We discussed return precautions for any new symptoms such as shortness of breath or chest pain.  Considered admission for telemetry however with patient's otherwise normal EKG without signs of arrhythmia and reassuring work-up I think she is appropriate for outpatient management and she preferred this as well.    Clinical Course as of 07/11/21 0044  Tue Jul 10, 2021  2145 D-Dimer, Quant: 0.47 [KM]    Clinical Course User Index [KM] Rada Hay, MD     FINAL CLINICAL IMPRESSION(S) / ED DIAGNOSES   Final diagnoses:  Palpitations     Rx / DC Orders   ED Discharge Orders     None        Note:  This document was prepared using Dragon voice  recognition software and may include unintentional dictation errors.   Rada Hay, MD 07/11/21 [phone]

## 2021-07-10 NOTE — Discharge Instructions (Signed)
Your blood work was overall reassuring.  We checked your thyroid studies which were normal as well as your cardiac enzymes and your electrolytes.  Your heart rate was initially a little bit high but this resolved.  He did look to have a little bit of fluid on your lungs potentially, please follow-up with your cardiologist about this.  If you have any new or concerning symptoms such as shortness of breath or chest pain, please return to the emergency department.

## 2021-07-10 NOTE — ED Notes (Signed)
Pt discharge information reviewed. Pt understands importance for need of follow up care, and when to return if symptoms worsen.  Pt understands all information. All questions answered. Pt is brought out of department in wheelchair.

## 2021-07-11 ENCOUNTER — Ambulatory Visit (INDEPENDENT_AMBULATORY_CARE_PROVIDER_SITE_OTHER): Payer: Medicare Other

## 2021-07-11 VITALS — Ht 60.0 in | Wt 141.0 lb

## 2021-07-11 DIAGNOSIS — Z0000 Encounter for general adult medical examination without abnormal findings: Secondary | ICD-10-CM | POA: Diagnosis not present

## 2021-07-11 NOTE — Patient Instructions (Addendum)
Laurie Patel , Thank you for taking time to come for your Medicare Wellness Visit. I appreciate your ongoing commitment to your health goals. Please review the following plan we discussed and let me know if I can assist you in the future.   These are the goals we discussed:  Goals      Follow up with Primary Care Provider     As needed        This is a list of the screening recommended for you and due dates:  Health Maintenance  Topic Date Due   COVID-19 Vaccine (5 - Booster for Pfizer series) 07/27/2021*   Zoster (Shingles) Vaccine (1 of 2) 10/09/2021*   Tetanus Vaccine  07/11/2022*   Hemoglobin A1C  08/03/2021   Complete foot exam   02/22/2022   Eye exam for diabetics  06/05/2022   Pneumonia Vaccine  Completed   Flu Shot  Completed   DEXA scan (bone density measurement)  Completed   HPV Vaccine  Aged Out  *Topic was postponed. The date shown is not the original due date.   Advanced directives: End of life planning; Advance aging; Advanced directives discussed.  Copy of current HCPOA/Living Will requested.    Follow up in one year for your annual wellness visit    Preventive Care 65 Years and Older, Female Preventive care refers to lifestyle choices and visits with your health care provider that can promote health and wellness. What does preventive care include? A yearly physical exam. This is also called an annual well check. Dental exams once or twice a year. Routine eye exams. Ask your health care provider how often you should have your [place] checked. Personal lifestyle choices, including: Daily care of your teeth and gums. Regular physical activity. Eating a healthy diet. Avoiding tobacco and drug use. Limiting alcohol use. Practicing safe sex. Taking low-dose aspirin every day. Taking vitamin and mineral supplements as recommended by your health care provider. What happens during an annual well check? The services and screenings done by your health care provider  during your annual well check will depend on your age, overall health, lifestyle risk factors, and family history of disease. Counseling  Your health care provider may ask you questions about your: Alcohol use. Tobacco use. Drug use. Emotional well-being. Home and relationship well-being. Sexual activity. Eating habits. History of falls. Memory and ability to understand (cognition). Work and work Statistician. Reproductive health. Screening  You may have the following tests or measurements: Height, weight, and BMI. Blood pressure. Lipid and cholesterol levels. These may be checked every 5 years, or more frequently if you are over 69 years old. Skin check. Lung cancer screening. You may have this screening every year starting at age 45 if you have a 30-pack-year history of smoking and currently smoke or have quit within the past 15 years. Fecal occult blood test (FOBT) of the stool. You may have this test every year starting at age 19. Flexible sigmoidoscopy or colonoscopy. You may have a sigmoidoscopy every 5 years or a colonoscopy every 10 years starting at age 5. Hepatitis C blood test. Hepatitis B blood test. Sexually transmitted disease (STD) testing. Diabetes screening. This is done by checking your blood sugar (glucose) after you have not eaten for a while (fasting). You may have this done every 1-3 years. Bone density scan. This is done to screen for osteoporosis. You may have this done starting at age 81. Mammogram. This may be done every 1-2 years. Talk to your health  care provider about how often you should have regular mammograms. Talk with your health care provider about your test results, treatment options, and if necessary, the need for more tests. Vaccines  Your health care provider may recommend certain vaccines, such as: Influenza vaccine. This is recommended every year. Tetanus, diphtheria, and acellular pertussis (Tdap, Td) vaccine. You may need a Td booster every  10 years. Zoster vaccine. You may need this after age 14. Pneumococcal 13-valent conjugate (PCV13) vaccine. One dose is recommended after age 21. Pneumococcal polysaccharide (PPSV23) vaccine. One dose is recommended after age 74. Talk to your health care provider about which screenings and vaccines you need and how often you need them. This information is not intended to replace advice given to you by your health care provider. Make sure you discuss any questions you have with your health care provider. Document Released: 07/07/2015 Document Revised: 02/28/2016 Document Reviewed: 04/11/2015 Elsevier Interactive Patient Education  2017 [employer].  Fall Prevention in the Home Falls can cause injuries. They can happen to people of all ages. There are many things you can do to make your home safe and to help prevent falls. What can I do on the outside of my home? Regularly fix the edges of walkways and driveways and fix any cracks. Remove anything that might make you trip as you walk through a door, such as a raised step or threshold. Trim any bushes or trees on the path to your home. Use bright outdoor lighting. Clear any walking paths of anything that might make someone trip, such as rocks or tools. Regularly check to see if handrails are loose or broken. Make sure that both sides of any steps have handrails. Any raised decks and porches should have guardrails on the edges. Have any leaves, snow, or ice cleared regularly. Use sand or salt on walking paths during winter. Clean up any spills in your garage right away. This includes oil or grease spills. What can I do in the bathroom? Use night lights. Install grab bars by the toilet and in the tub and shower. Do not use towel bars as grab bars. Use non-skid mats or decals in the tub or shower. If you need to sit down in the shower, use a plastic, non-slip stool. Keep the floor dry. Clean up any water that spills on the floor as soon as it  happens. Remove soap buildup in the tub or shower regularly. Attach bath mats securely with double-sided non-slip rug tape. Do not have throw rugs and other things on the floor that can make you trip. What can I do in the bedroom? Use night lights. Make sure that you have a light by your bed that is easy to reach. Do not use any sheets or blankets that are too big for your bed. They should not hang down onto the floor. Have a firm chair that has side arms. You can use this for support while you get dressed. Do not have throw rugs and other things on the floor that can make you trip. What can I do in the kitchen? Clean up any spills right away. Avoid walking on wet floors. Keep items that you use a lot in easy-to-reach places. If you need to reach something above you, use a strong step stool that has a grab bar. Keep electrical cords out of the way. Do not use floor polish or wax that makes floors slippery. If you must use wax, use non-skid floor wax. Do not have throw  rugs and other things on the floor that can make you trip. What can I do with my stairs? Do not leave any items on the stairs. Make sure that there are handrails on both sides of the stairs and use them. Fix handrails that are broken or loose. Make sure that handrails are as long as the stairways. Check any carpeting to make sure that it is firmly attached to the stairs. Fix any carpet that is loose or worn. Avoid having throw rugs at the top or bottom of the stairs. If you do have throw rugs, attach them to the floor with carpet tape. Make sure that you have a light switch at the top of the stairs and the bottom of the stairs. If you do not have them, ask someone to add them for you. What else can I do to help prevent falls? Wear shoes that: Do not have high heels. Have rubber bottoms. Are comfortable and fit you well. Are closed at the toe. Do not wear sandals. If you use a stepladder: Make sure that it is fully opened.  Do not climb a closed stepladder. Make sure that both sides of the stepladder are locked into place. Ask someone to hold it for you, if possible. Clearly mark and make sure that you can see: Any grab bars or handrails. First and last steps. Where the edge of each step is. Use tools that help you move around (mobility aids) if they are needed. These include: Canes. Walkers. Scooters. Crutches. Turn on the lights when you go into a dark area. Replace any light bulbs as soon as they burn out. Set up your furniture so you have a clear path. Avoid moving your furniture around. If any of your floors are uneven, fix them. If there are any pets around you, be aware of where they are. Review your medicines with your doctor. Some medicines can make you feel dizzy. This can increase your chance of falling. Ask your doctor what other things that you can do to help prevent falls. This information is not intended to replace advice given to you by your health care provider. Make sure you discuss any questions you have with your health care provider. Document Released: 04/06/2009 Document Revised: 11/16/2015 Document Reviewed: 07/15/2014 Elsevier Interactive Patient Education  2017 [employer].

## 2021-07-11 NOTE — Progress Notes (Signed)
Subjective:   Laurie Patel is a 88 y.o. female who presents for Medicare Annual (Subsequent) preventive examination.  Review of Systems    No ROS.  Medicare Wellness Virtual Visit.  Visual/audio telehealth visit, UTA vital signs.   See social history for additional risk factors.   Cardiac Risk Factors include: advanced age (>58men, >6 women)     Objective:    Today's Vitals   07/11/21 0954  Weight: 141 lb (64 kg)  Height: 5' (1.524 m)   Body mass index is 27.54 kg/m.  Advanced Directives 07/11/2021 07/10/2021 01/30/2021 07/10/2020 04/09/2020 07/08/2019 07/02/2018  Does Patient Have a Medical Advance Directive? Yes Yes No No No Yes No  Type of Advance Directive Living will Living will - - - [place];Living will -  Does patient want to make changes to medical advance directive? - - - - - - No - Patient declined  Copy of [place] in Chart? - - - - - No - copy requested -  Would patient like information on creating a medical advance directive? - - - No - Patient declined - - -    Current Medications (verified) Outpatient Encounter Medications as of 07/11/2021  Medication Sig   aspirin 81 MG EC tablet Take 81 mg by mouth daily. Swallow whole.   cholecalciferol (VITAMIN D3) 25 MCG (1000 UNIT) tablet Take 1,000 Units by mouth daily.   Continuous Blood Gluc Receiver (FREESTYLE LIBRE 14 DAY READER) DEVI Use at least every 8 hours to check glucose. Dx code E11.9.   Continuous Blood Gluc Sensor (FREESTYLE LIBRE 14 DAY SENSOR) MISC Apply every 14 days. Dx code E11.9.   FLUZONE HIGH-DOSE QUADRIVALENT 0.7 ML SUSY    linagliptin (TRADJENTA) 5 MG TABS tablet Take 1 tablet (5 mg total) by mouth once a week. (Patient not taking: Reported on 07/11/2021)   lisinopril (ZESTRIL) 10 MG tablet Take 1 tablet (10 mg total) by mouth daily.   Multiple Vitamins-Calcium (ONE-A-DAY WOMENS PO) Take 1 tablet by mouth daily.   PFIZER COVID-19 VAC BIVALENT injection    No  facility-administered encounter medications on file as of 07/11/2021.    Allergies (verified) Augmentin [amoxicillin-pot clavulanate] and Prednisone   History: Past Medical History:  Diagnosis Date   Ankle fracture 10/14/2017   Heart murmur    PNA (pneumonia) 02/16/2018   Past Surgical History:  Procedure Laterality Date   APPENDECTOMY  1986   EYE SURGERY Bilateral 1995 and 1999   Lens implants   WRIST SURGERY Left 1986   ORIF    Family History  Problem Relation Age of Onset   Cancer Mother 58       Breast Cancer   Heart disease Father 69       Congestive Heart Failure   COPD Father 58       Emphysema   Heart disease Brother 77       massive heart attack   Heart disease Maternal Grandmother    Heart disease Maternal Grandfather    Early death Paternal Grandmother        Early death unsure age   Heart disease Paternal Grandfather    Obesity Paternal Grandfather    Social History   Socioeconomic History   Marital status: Widowed    Spouse name: Not on file   Number of children: Not on file   Years of education: Not on file   Highest education level: Not on file  Occupational History   Not  on file  Tobacco Use   Smoking status: Never   Smokeless tobacco: Never  Vaping Use   Vaping Use: Never used  Substance and Sexual Activity   Alcohol use: No    Alcohol/week: 0.0 standard drinks   Drug use: No   Sexual activity: Never  Other Topics Concern   Not on file  Social History Narrative   Lives in [place]    Owns a [employer]    1 dog   Social Determinants of Health   Financial Resource Strain: Low Risk    Difficulty of Paying Living Expenses: Not hard at all  Food Insecurity: No Food Insecurity   Worried About Charity fundraiser in the Last Year: Never true   Arboriculturist in the Last Year: Never true  Transportation Needs: No Transportation Needs   Lack of Transportation (Medical): No   Lack of Transportation (Non-Medical): No  Physical  Activity: Not on file  Stress: No Stress Concern Present   Feeling of Stress : Not at all  Social Connections: Unknown   Frequency of Communication with Friends and Family: More than three times a week   Frequency of Social Gatherings with Friends and Family: More than three times a week   Attends Religious Services: Not on file   Active Member of Clubs or Organizations: Not on file   Attends Archivist Meetings: Not on file   Marital Status: Widowed    Tobacco Counseling Counseling given: Not Answered   Clinical Intake:  Pre-visit preparation completed: Yes        Diabetes: Yes  How often do you need to have someone help you when you read instructions, pamphlets, or other written materials from your doctor or pharmacy?: 1 - Never  Nutrition Risk Assessment: Does the patient have any non-healing wounds?  No   Financial Strains and Diabetes Management: Is the patient seen by Chronic Care Management for management of their diabetes?  Yes  Would the patient like to be referred to a Nutritionist or for Diabetic Management?  No  Interpreter Needed?: No      Activities of Daily Living In your present state of health, do you have any difficulty performing the following activities: 07/11/2021  Hearing? N  Vision? N  Difficulty concentrating or making decisions? N  Walking or climbing stairs? Y  Comment Walker/cane in use when ambulating.  Dressing or bathing? N  Doing errands, shopping? Y  Comment Family assist in driving when running errands.  Preparing Food and eating ? N  Using the Toilet? N  In the past six months, have you accidently leaked urine? N  Do you have problems with loss of bowel control? N  Managing your Medications? N  Managing your Finances? N  Housekeeping or managing your Housekeeping? N  Some recent data might be hidden    Patient Care Team: Leone Haven, MD as PCP - General (Family Medicine)  Indicate any recent Medical  Services you may have received from other than [facility] providers in the past year (date may be approximate).     Assessment:   This is a routine wellness examination for [place].  Virtual Visit via Telephone Note  I connected with  Laurie Patel on 07/11/21 at  9:45 AM EST by telephone and verified that I am speaking with the correct person using two identifiers.  Persons participating in the virtual visit: patient/Nurse Health Advisor   I discussed the limitations, risks, security and privacy  concerns of performing an evaluation and management service by telephone and the availability of in person appointments. The patient expressed understanding and agreed to proceed.  Interactive audio and video telecommunications were attempted between this nurse and patient, however failed, due to patient having technical difficulties OR patient did not have access to video capability.  We continued and completed visit with audio only.  Some vital signs may be absent or patient reported.   Hearing/Vision screen Hearing Screening - Comments:: Patient has some difficulty hearing conversational tones, R ear. Patient does not wear hearing aids. Audiology deferred in the last year, per patient preference.  Vision Screening - Comments:: Followed [facility]  Wears corrective lenses Cataract extraction, bilateral  They have regular follow up with the ophthalmologist  Dietary issues and exercise activities discussed: Current Exercise Habits: Home exercise routine, Type of exercise: walking, Intensity: Mild Healthy diet  Good water intake   Goals Addressed             This Visit's Progress    Follow up with Primary Care Provider       As needed     COMPLETED: Weight (lb) < 144 lb (65.3 kg)   141 lb (64 kg)    Weight goal 141lb       Depression Screen PHQ 2/9 Scores 07/11/2021 07/20/2020 07/10/2020 03/14/2020 11/09/2019 07/08/2019 06/21/2019  PHQ - 2 Score 0 0 0 0 0 0 0  PHQ- 9 Score -  - - - - - -    Fall Risk Fall Risk  07/11/2021 07/20/2020 07/10/2020 03/14/2020 11/09/2019  Falls in the past year? 0 0 0 0 0  Number falls in past yr: 0 0 0 0 0  Injury with Fall? - 0 0 - -  Comment - - - - -  Risk for fall due to : - - Impaired balance/gait - -  Risk for fall due to: Comment - - Cane/walker in use - -  Follow up Falls evaluation completed Falls evaluation completed Falls evaluation completed Falls evaluation completed Falls evaluation completed    [place]: Home free of loose throw rugs in walkways, pet beds, electrical cords, etc? Yes  Adequate lighting in your home to reduce risk of falls? Yes   ASSISTIVE DEVICES UTILIZED TO PREVENT FALLS: Life alert? No  Use of a cane, walker or w/c? Yes  Grab bars in the bathroom? Yes  Shower chair or bench in shower? No  Elevated toilet seat or a handicapped toilet? Yes   TIMED UP AND GO: Was the test performed? No .   Cognitive Function: Patient is alert and oriented x3. Learning a new language.   MMSE - Mini Mental State Exam 06/26/2017  Orientation to time 5  Orientation to Place 5  Registration 3  Attention/ Calculation 5  Recall 3  Language- name 2 objects 2  Language- repeat 1  Language- follow 3 step command 3  Language- read & follow direction 1  Write a sentence 1  Copy design 1  Total score 30     6CIT Screen 07/11/2021 07/08/2019 07/02/2018  What Year? 0 points 0 points 0 points  What month? 0 points 0 points 0 points  What time? 0 points 0 points 0 points  Count back from 20 - 0 points 0 points  Months in reverse - 0 points 0 points  Repeat phrase - 0 points 0 points  Total Score - 0 0    Immunizations  Immunization History  Administered Date(s) Administered   Influenza Split 02/24/2013, 03/24/2014, 02/29/2016   Influenza, High Dose Seasonal PF 04/10/2017, 02/14/2018, 02/10/2019, 02/22/2021   Influenza-Unspecified 04/07/2017, 02/10/2019, 03/07/2020   PFIZER(Purple  Top)SARS-COV-2 Vaccination 07/30/2019, 08/20/2019, 05/23/2020, 10/03/2020   Pneumococcal Conjugate-13 02/10/2019   Pneumococcal Polysaccharide-23 02/10/2019   Pneumococcal-Unspecified 03/24/2014   Td 02/08/2010   Tdap 06/24/2008   TDAP status: Due, Education has been provided regarding the importance of this vaccine. Advised may receive this vaccine at local pharmacy or Health Dept. Aware to provide a copy of the vaccination record if obtained from local pharmacy or Health Dept. Verbalized acceptance and understanding. Deferred.   Shingrix Completed?: No.    Education has been provided regarding the importance of this vaccine. Patient has been advised to call insurance company to determine out of pocket expense if they have not yet received this vaccine. Advised may also receive vaccine at local pharmacy or Health Dept. Verbalized acceptance and understanding.  Screening Tests Health Maintenance  Topic Date Due   COVID-19 Vaccine (5 - Booster for Pfizer series) 07/27/2021 (Originally 11/28/2020)   Zoster Vaccines- Shingrix (1 of 2) 10/09/2021 (Originally [birth year]/1979)   TETANUS/TDAP  07/11/2022 (Originally 02/09/2020)   HEMOGLOBIN A1C  08/03/2021   FOOT EXAM  02/22/2022   OPHTHALMOLOGY EXAM  06/05/2022   Pneumonia Vaccine 25+ Years old  Completed   INFLUENZA VACCINE  Completed   DEXA SCAN  Completed   HPV VACCINES  Aged Out   Health Maintenance There are no preventive care reminders to display for this patient.  Lung Cancer Screening: (Low Dose CT Chest recommended if Age 20-80 years, 30 pack-year currently smoking OR have quit w/in 15years.) does not qualify.   Vision Screening: Recommended annual ophthalmology exams for early detection of glaucoma and other disorders of the eye.  Dental Screening: Recommended annual dental exams for proper oral hygiene  Community Resource Referral / Chronic Care Management: CRR required this visit?  No   CCM required this visit?  No      Plan:      I have personally reviewed and noted the following in the patients chart:   Medical and social history Use of alcohol, tobacco or illicit drugs  Current medications and supplements including opioid prescriptions.  Functional ability and status Nutritional status Physical activity Advanced directives List of other physicians Hospitalizations, surgeries, and ER visits in previous 12 months Vitals Screenings to include cognitive, depression, and falls Referrals and appointments  In addition, I have reviewed and discussed with patient certain preventive protocols, quality metrics, and best practice recommendations. A written personalized care plan for preventive services as well as general preventive health recommendations were provided to patient.     Varney Biles, LPN   [phone]

## 2021-07-12 ENCOUNTER — Encounter: Payer: Self-pay | Admitting: Podiatry

## 2021-07-12 ENCOUNTER — Other Ambulatory Visit: Payer: Self-pay

## 2021-07-12 ENCOUNTER — Ambulatory Visit: Payer: Medicare Other | Admitting: Podiatry

## 2021-07-12 DIAGNOSIS — E119 Type 2 diabetes mellitus without complications: Secondary | ICD-10-CM | POA: Diagnosis not present

## 2021-07-12 DIAGNOSIS — M79676 Pain in unspecified toe(s): Secondary | ICD-10-CM | POA: Diagnosis not present

## 2021-07-12 DIAGNOSIS — B351 Tinea unguium: Secondary | ICD-10-CM | POA: Diagnosis not present

## 2021-07-12 NOTE — Progress Notes (Signed)
This patient returns to my office for at risk foot care.  This patient requires this care by a professional since this patient will be at risk due to having diabetes.   This patient is unable to cut nails herself since the patient cannot reach her nails.These nails are painful walking and wearing shoes.  This patient presents for at risk foot care today.  General Appearance  Alert, conversant and in no acute stress.  Vascular  Dorsalis pedis and posterior tibial  pulses are weakly  palpable  bilaterally.  Capillary return is within normal limits  bilaterally. Temperature is within normal limits  bilaterally.  Neurologic  Senn-Weinstein monofilament wire test within normal limits  bilaterally. Muscle power within normal limits bilaterally.  Nails Thick disfigured discolored nails with subungual debris  from hallux to fifth toenails left and 2-5 toenails right..  Absence of right hallux nail. No evidence of bacterial infection or drainage bilaterally.  Orthopedic  No limitations of motion  feet .  No crepitus or effusions noted.  No bony pathology or digital deformities noted.  Skin  normotropic skin with no porokeratosis noted bilaterally.  No signs of infections or ulcers noted.     Onychomycosis  Pain in right toes  Pain in left toes  Consent was obtained for treatment procedures.   Mechanical debridement of nails 1-5  Left and 2-5 right  performed with a nail nipper with the exception right hallux toenail..  Filed with dremel without incident.     Return office visit  10 weeks                    Told patient to return for periodic foot care and evaluation due to potential at risk complications.   Khalif Stender DPM  

## 2021-07-24 ENCOUNTER — Ambulatory Visit (INDEPENDENT_AMBULATORY_CARE_PROVIDER_SITE_OTHER): Payer: Medicare Other | Admitting: Family Medicine

## 2021-07-24 ENCOUNTER — Other Ambulatory Visit: Payer: Self-pay

## 2021-07-24 ENCOUNTER — Encounter: Payer: Self-pay | Admitting: Family Medicine

## 2021-07-24 VITALS — BP 128/70 | HR 100 | Temp 98.1°F | Ht 65.0 in | Wt 148.8 lb

## 2021-07-24 DIAGNOSIS — I10 Essential (primary) hypertension: Secondary | ICD-10-CM | POA: Diagnosis not present

## 2021-07-24 DIAGNOSIS — J309 Allergic rhinitis, unspecified: Secondary | ICD-10-CM

## 2021-07-24 DIAGNOSIS — R002 Palpitations: Secondary | ICD-10-CM

## 2021-07-24 DIAGNOSIS — H9201 Otalgia, right ear: Secondary | ICD-10-CM | POA: Diagnosis not present

## 2021-07-24 DIAGNOSIS — E119 Type 2 diabetes mellitus without complications: Secondary | ICD-10-CM

## 2021-07-24 LAB — POCT GLYCOSYLATED HEMOGLOBIN (HGB A1C): Hemoglobin A1C: 6.5 % — AB (ref 4.0–5.6)

## 2021-07-24 MED ORDER — IPRATROPIUM BROMIDE 0.03 % NA SOLN: 2.0000 | Freq: Two times a day (BID) | NASAL | 12 refills | Status: DC

## 2021-07-24 NOTE — Patient Instructions (Signed)
Nice to see you. You can try the Atrovent nasal spray. If this is beneficial please continue with this.

## 2021-07-24 NOTE — Assessment & Plan Note (Signed)
Possibly allergic rhinitis versus nonallergic rhinitis.  We will try Atrovent nasal spray.  If not beneficial she can discontinue this.

## 2021-07-24 NOTE — Assessment & Plan Note (Signed)
Well-controlled.  She will remain off of Tradjenta at this time given her possible hypoglycemic symptoms previously.  She will no longer use her libre.  We will recheck her A1c at her next visit.

## 2021-07-24 NOTE — Progress Notes (Signed)
Marikay Alar, MD Phone: [phone]  Laurie Patel is a 88 y.o. female who presents today for f/u.  HYPERTENSION Disease Monitoring Home BP Monitoring 130s systolic Chest pain- no    Dyspnea- no Medications Compliance-  taking lisinopril.   Edema- no BMET    Component Value Date/Time   NA 137 07/10/2021 1935   K 3.9 07/10/2021 1935   CL 99 07/10/2021 1935   CO2 28 07/10/2021 1935   GLUCOSE 207 (H) 07/10/2021 1935   BUN 13 07/10/2021 1935   CREATININE 0.74 07/10/2021 1935   CALCIUM 9.6 07/10/2021 1935   GFRNONAA >60 07/10/2021 1935   DIABETES Disease Monitoring: Blood Sugar ranges-not checking      Optho- UTD Medications: Compliance- stopped tradjenta Hypoglycemic symptoms- yes, had sensation of tiredness and feeling cold, felt off balance and shaky, ate something and improved, this prompted her to stop the tradjenta.  She notes she had a large bruise from her [place].  She has stopped using that as well.  The bruise has improved.  Notably her platelets were acceptable earlier in January during an ED visit.  Palpitations: Patient notes she went to the ED for evaluation of this.  She bent over and stood up after grabbing a cookie off the ground.  She had some palpitations and some mild shortness of breath.  Her blood pressure was 113 systolic and her heart rate was 119.  She had an ED evaluation which was overall reassuring.  She had some PVCs and was slightly tachycardic.  She had no recurrence.  Typically does not have palpitations.  Notes no episodes of chest pain.  No other shortness of breath.  Chronic right ear issues: Patient notes she was evaluated by ENT at [facility].  Per their note they did not find anything wrong.  She returned to the local ENT who she states advised that there is nothing he could do for her and wondered why she returned to see him.  Rhinorrhea: This is a chronic issue.  She has tried multiple nose sprays and Allegra with no benefit.     Social History    Tobacco Use  Smoking Status Never  Smokeless Tobacco Never    Current Outpatient Medications on File Prior to Visit  Medication Sig Dispense Refill   aspirin 81 MG EC tablet Take 81 mg by mouth daily. Swallow whole.     cholecalciferol (VITAMIN D3) 25 MCG (1000 UNIT) tablet Take 1,000 Units by mouth daily.     FLUZONE HIGH-DOSE QUADRIVALENT 0.7 ML SUSY      lisinopril (ZESTRIL) 10 MG tablet Take 1 tablet (10 mg total) by mouth daily. 90 tablet 1   Multiple Vitamins-Calcium (ONE-A-DAY WOMENS PO) Take 1 tablet by mouth daily.     PFIZER COVID-19 VAC BIVALENT injection      No current facility-administered medications on file prior to visit.     ROS see history of present illness  Objective  Physical Exam Vitals:   07/24/21 0836 07/24/21 0858  BP: (!) 160/80 128/70  Pulse: 100   Temp: 98.1 F (36.7 C)   SpO2: 93%     BP Readings from Last 3 Encounters:  07/24/21 128/70  07/10/21 (!) 150/84  02/15/21 (!) 200/68   Wt Readings from Last 3 Encounters:  07/24/21 148 lb 12.8 oz (67.5 kg)  07/11/21 141 lb (64 kg)  07/10/21 141 lb (64 kg)    Physical Exam Constitutional:      General: She is not in acute distress.  Appearance: She is not diaphoretic.  HENT:     Ears:     Comments: Small amount of cerumen in the right ear canal, left TM appears normal Cardiovascular:     Rate and Rhythm: Normal rate and regular rhythm.     Heart sounds: Normal heart sounds.  Pulmonary:     Effort: Pulmonary effort is normal.     Breath sounds: Normal breath sounds.  Skin:    General: Skin is warm and dry.  Neurological:     Mental Status: She is alert.     Assessment/Plan: Please see individual problem list.  Problem List Items Addressed This Visit     Allergic rhinitis    Possibly allergic rhinitis versus nonallergic rhinitis.  We will try Atrovent nasal spray.  If not beneficial she can discontinue this.      Diabetes (HCC) - Primary    Well-controlled.  She will  remain off of Tradjenta at this time given her possible hypoglycemic symptoms previously.  She will no longer use her libre.  We will recheck her A1c at her next visit.      Relevant Orders   POCT HgB A1C (Completed)   Ear discomfort, right    Chronic issue.  She has been evaluated by ENT.  We will monitor.      HTN (hypertension)    Well-controlled.  She will continue lisinopril 10 mg once daily.      Palpitations    It seems as though the symptoms that took her to the ED were likely orthostasis.  Discussed adequate hydration.  Discussed monitoring for recurrence.  She will see cardiology as planned.       Return in about 6 months (around 01/21/2022) for Diabetes/hypertension.  This visit occurred during the SARS-CoV-2 public health emergency.  Safety protocols were in place, including screening questions prior to the visit, additional usage of staff PPE, and extensive cleaning of exam room while observing appropriate contact time as indicated for disinfecting solutions.    Marikay Alar, MD [facility]

## 2021-07-24 NOTE — Assessment & Plan Note (Signed)
Chronic issue.  She has been evaluated by ENT.  We will monitor.

## 2021-07-24 NOTE — Assessment & Plan Note (Signed)
It seems as though the symptoms that took her to the ED were likely orthostasis.  Discussed adequate hydration.  Discussed monitoring for recurrence.  She will see cardiology as planned.

## 2021-07-24 NOTE — Assessment & Plan Note (Signed)
Well-controlled.  She will continue lisinopril 10 mg once daily.

## 2021-08-02 ENCOUNTER — Other Ambulatory Visit: Payer: Self-pay

## 2021-08-02 ENCOUNTER — Ambulatory Visit: Payer: Medicare Other | Admitting: Cardiology

## 2021-08-02 ENCOUNTER — Ambulatory Visit: Payer: Self-pay

## 2021-08-02 ENCOUNTER — Encounter: Payer: Self-pay | Admitting: Cardiology

## 2021-08-02 VITALS — BP 172/90 | HR 96 | Ht 65.5 in | Wt 148.0 lb

## 2021-08-02 DIAGNOSIS — I10 Essential (primary) hypertension: Secondary | ICD-10-CM

## 2021-08-02 DIAGNOSIS — R0602 Shortness of breath: Secondary | ICD-10-CM | POA: Diagnosis not present

## 2021-08-02 DIAGNOSIS — R002 Palpitations: Secondary | ICD-10-CM | POA: Diagnosis not present

## 2021-08-02 DIAGNOSIS — I517 Cardiomegaly: Secondary | ICD-10-CM

## 2021-08-02 NOTE — Progress Notes (Signed)
Cardiology Office Note:    Date:  08/02/2021   ID:  Laurie Patel, DOB 1927-07-21, MRN [id number]  PCP:  Laurie Luis, MD   [facility] Providers Cardiologist:  None     Referring MD: Laurie Luis, MD   Chief Complaint  Patient presents with   Other    6 month / ED follow up -- Meds reviewed verbally with patient.     History of Present Illness:    Laurie Patel is a 88 y.o. female with a hx of hypertension, whitecoat syndrome, diabetes, LVH who presents for follow-up.    She is being seen for LVH, hypertension.  She has not component of whitecoat syndrome.  Recently seen in the emergency room due to palpitations.  Patient bent over to pick up a cookie and noticed her heart rate went up.  Recorded 119 bpm.  Told daughter to take her to the emergency room.  In the ED, work-up with EKG and troponins was unrevealing.  EKG shows sinus tachycardia, heart rate 150.  Patient was previously on Tradjenta for diabetes.  She stopped taking this about a month ago with resolution of previous symptoms of shortness of breath.  She states feeling much better, has not had any further episodes of palpitations, breathing is also improved.  She denies dizziness or syncope.   Prior notes  echocardiogram 01/2021 showing preserved ejection fraction, asymmetric septal hypertrophy up to 1.5 cm, no LVOT obstruction, mild aortic valve sclerosis.    Past Medical History:  Diagnosis Date   Ankle fracture 10/14/2017   Heart murmur    PNA (pneumonia) 02/16/2018    Past Surgical History:  Procedure Laterality Date   APPENDECTOMY  1986   EYE SURGERY Bilateral 1995 and 1999   Lens implants   WRIST SURGERY Left 1986   ORIF     Current Medications: Current Meds  Medication Sig   aspirin 81 MG EC tablet Take 81 mg by mouth daily. Swallow whole.   cholecalciferol (VITAMIN D3) 25 MCG (1000 UNIT) tablet Take 1,000 Units by mouth daily.   FLUZONE HIGH-DOSE QUADRIVALENT 0.7 ML SUSY     ipratropium (ATROVENT) 0.03 % nasal spray Place 2 sprays into both nostrils every 12 (twelve) hours.   lisinopril (ZESTRIL) 10 MG tablet Take 1 tablet (10 mg total) by mouth daily.   Multiple Vitamins-Calcium (ONE-A-DAY WOMENS PO) Take 1 tablet by mouth daily.   PFIZER COVID-19 VAC BIVALENT injection      Allergies:   Augmentin [amoxicillin-pot clavulanate] and Prednisone   Social History   Socioeconomic History   Marital status: Widowed    Spouse name: Not on file   Number of children: Not on file   Years of education: Not on file   Highest education level: Not on file  Occupational History   Not on file  Tobacco Use   Smoking status: Never   Smokeless tobacco: Never  Vaping Use   Vaping Use: Never used  Substance and Sexual Activity   Alcohol use: No    Alcohol/week: 0.0 standard drinks   Drug use: No   Sexual activity: Never  Other Topics Concern   Not on file  Social History Narrative   Lives in [place]    Owns a [employer]    1 dog   Social Determinants of Health   Financial Resource Strain: Low Risk    Difficulty of Paying Living Expenses: Not hard at all  Food Insecurity: No Food Insecurity   Worried  About Running Out of Food in the Last Year: Never true   Ran Out of Food in the Last Year: Never true  Transportation Needs: No Transportation Needs   Lack of Transportation (Medical): No   Lack of Transportation (Non-Medical): No  Physical Activity: Not on file  Stress: No Stress Concern Present   Feeling of Stress : Not at all  Social Connections: Unknown   Frequency of Communication with Friends and Family: More than three times a week   Frequency of Social Gatherings with Friends and Family: More than three times a week   Attends Religious Services: Not on file   Active Member of Clubs or Organizations: Not on file   Attends Banker Meetings: Not on file   Marital Status: Widowed     Family History: The patient's family history  includes COPD (age of onset: 19) in her father; Cancer (age of onset: 92) in her mother; Early death in her paternal grandmother; Heart disease in her maternal grandfather, maternal grandmother, and paternal grandfather; Heart disease (age of onset: 11) in her brother; Heart disease (age of onset: 73) in her father; Obesity in her paternal grandfather.  ROS:   Please see the history of present illness.     All other systems reviewed and are negative.  EKGs/Labs/Other Studies Reviewed:    The following studies were reviewed today:   EKG:  EKG is  ordered today.  The ekg ordered today demonstrates sinus rhythm, left bundle branch block, occasional PVC  Recent Labs: 01/31/2021: Magnesium 2.1 07/10/2021: BUN 13; Creatinine, Ser 0.74; Hemoglobin 15.7; Platelets 233; Potassium 3.9; Sodium 137; TSH 3.150  Recent Lipid Panel    Component Value Date/Time   CHOL 173 03/14/2020 0941   TRIG 117.0 03/14/2020 0941   HDL 36.20 (L) 03/14/2020 0941   CHOLHDL 5 03/14/2020 0941   VLDL 23.4 03/14/2020 0941   LDLCALC 114 (H) 03/14/2020 0941   LDLDIRECT 147.0 03/21/2016 0926     Risk Assessment/Calculations:          Physical Exam:    VS:  BP (!) 172/90 (BP Location: Left Arm, Patient Position: Sitting, Cuff Size: Normal)    Pulse 96    Ht 5' 5.5" (1.664 m)    Wt 148 lb (67.1 kg)    SpO2 95%    BMI 24.25 kg/m     Wt Readings from Last 3 Encounters:  08/02/21 148 lb (67.1 kg)  07/24/21 148 lb 12.8 oz (67.5 kg)  07/11/21 141 lb (64 kg)     GEN:  Well nourished, well developed in no acute distress HEENT: Normal NECK: No JVD; No carotid bruits LYMPHATICS: No lymphadenopathy CARDIAC: RRR, faint systolic murmur RESPIRATORY:  Clear to auscultation without rales, wheezing or rhonchi  ABDOMEN: Soft, non-tender, non-distended MUSCULOSKELETAL:  No edema; No deformity  SKIN: Warm and dry NEUROLOGIC:  Alert and oriented x 3 PSYCHIATRIC:  Normal affect   ASSESSMENT:    1. SOB (shortness of  breath)   2. Primary hypertension   3. LVH (left ventricular hypertrophy)   4. Palpitations     PLAN:    In order of problems listed above:  Shortness of breath, currently resolved since stopping Tradjenta.  Continue to monitor.  Denies chest pain, not consistent with angina equivalent.  Echo with preserved ejection fraction, impaired relaxation, EF 55 to 60%. Hypertension, BP elevated, usually controlled at home with systolic in the 120s.  Patient has a component of whitecoat syndrome.  Continue increased dose of  lisinopril 10 mg daily. Asymmetric basal septal LVH up to 1.5 cm, no LVOT obstruction.  No plans to further evaluate with CMR, no plans for intervention if HCM is diagnosed.  Discussed with patient. Palpitations associated with pending.  Place cardiac monitor to rule out any significant arrhythmias in light of LVH.  Consider beta-blocker if significant arrhythmias are noted.  Follow-up in 6 months.     Medication Adjustments/Labs and Tests Ordered: Current medicines are reviewed at length with the patient today.  Concerns regarding medicines are outlined above.  Orders Placed This Encounter  Procedures   LONG TERM MONITOR (3-14 DAYS)   EKG 12-Lead    No orders of the defined types were placed in this encounter.    Patient Instructions  Medication Instructions:  Your physician recommends that you continue on your current medications as directed. Please refer to the Current Medication list given to you today.  *If you need a refill on your cardiac medications before your next appointment, please call your pharmacy*   Lab Work: None ordered If you have labs (blood work) drawn today and your tests are completely normal, you will receive your results only by: MyChart Message (if you have MyChart) OR A paper copy in the mail If you have any lab test that is abnormal or we need to change your treatment, we will call you to review the  results.   Testing/Procedures:  Your physician has recommended that you wear a Zio XT monitor for 14 days. This will be mailed to your house in the next 4-5 business days.  This monitor is a medical device that records the hearts electrical activity. Doctors most often use these monitors to diagnose arrhythmias. Arrhythmias are problems with the speed or rhythm of the heartbeat. The monitor is a small device applied to your chest. You can wear one while you do your normal daily activities. While wearing this monitor if you have any symptoms to push the button and record what you felt. Once you have worn this monitor for the period of time provider prescribed (Usually 14 days), you will return the monitor device in the postage paid box. Once it is returned they will download the data collected and provide [place] with a report which the provider will then review and we will call you with those results. Important tips:  Avoid showering during the first 24 hours of wearing the monitor. Avoid excessive sweating to help maximize wear time. Do not submerge the device, no hot tubs, and no swimming pools. Keep any lotions or oils away from the patch. After 24 hours you may shower with the patch on. Take brief showers with your back facing the shower head.  Do not remove patch once it has been placed because that will interrupt data and decrease adhesive wear time. Push the button when you have any symptoms and write down what you were feeling. Once you have completed wearing your monitor, remove and place into box which has postage paid and place in your outgoing mailbox.  If for some reason you have misplaced your box then call our office and we can provide another box and/or mail it off for you.      Follow-Up: At [facility], you and your health needs are our priority.  As part of our continuing mission to provide you with exceptional heart care, we have created designated Provider Care Teams.   These Care Teams include your primary Cardiologist (physician) and Advanced Practice Providers (APPs -  Physician Assistants and Nurse Practitioners) who all work together to provide you with the care you need, when you need it.  We recommend signing up for the patient portal called "MyChart".  Sign up information is provided on this After Visit Summary.  MyChart is used to connect with patients for Virtual Visits (Telemedicine).  Patients are able to view lab/test results, encounter notes, upcoming appointments, etc.  Non-urgent messages can be sent to your provider as well.   To learn more about what you can do with MyChart, go to [email or web address].    Your next appointment:   6 months  The format for your next appointment:   In Person  Provider:   You may see Debbe OdeaBrian Agbor-Etang, MD or one of the following Advanced Practice Providers on your designated Care Team:   Nicolasa Duckinghristopher Berge, NP Eula Listenyan Dunn, PA-C Cadence Fransico MichaelFurth, [place]-C   Other Instructions     Signed, Debbe OdeaBrian Agbor-Etang, MD  08/02/2021 11:06 AM    [facility]

## 2021-08-02 NOTE — Patient Instructions (Addendum)
Medication Instructions:  Your physician recommends that you continue on your current medications as directed. Please refer to the Current Medication list given to you today.  *If you need a refill on your cardiac medications before your next appointment, please call your pharmacy*   Lab Work: None ordered If you have labs (blood work) drawn today and your tests are completely normal, you will receive your results only by: MyChart Message (if you have MyChart) OR A paper copy in the mail If you have any lab test that is abnormal or we need to change your treatment, we will call you to review the results.   Testing/Procedures:  Your physician has recommended that you wear a Zio XT monitor for 14 days. This will be mailed to your house in the next 4-5 business days.  This monitor is a medical device that records the hearts electrical activity. Doctors most often use these monitors to diagnose arrhythmias. Arrhythmias are problems with the speed or rhythm of the heartbeat. The monitor is a small device applied to your chest. You can wear one while you do your normal daily activities. While wearing this monitor if you have any symptoms to push the button and record what you felt. Once you have worn this monitor for the period of time provider prescribed (Usually 14 days), you will return the monitor device in the postage paid box. Once it is returned they will download the data collected and provide [place] with a report which the provider will then review and we will call you with those results. Important tips:  Avoid showering during the first 24 hours of wearing the monitor. Avoid excessive sweating to help maximize wear time. Do not submerge the device, no hot tubs, and no swimming pools. Keep any lotions or oils away from the patch. After 24 hours you may shower with the patch on. Take brief showers with your back facing the shower head.  Do not remove patch once it has been placed because that  will interrupt data and decrease adhesive wear time. Push the button when you have any symptoms and write down what you were feeling. Once you have completed wearing your monitor, remove and place into box which has postage paid and place in your outgoing mailbox.  If for some reason you have misplaced your box then call our office and we can provide another box and/or mail it off for you.      Follow-Up: At [facility], you and your health needs are our priority.  As part of our continuing mission to provide you with exceptional heart care, we have created designated Provider Care Teams.  These Care Teams include your primary Cardiologist (physician) and Advanced Practice Providers (APPs -  Physician Assistants and Nurse Practitioners) who all work together to provide you with the care you need, when you need it.  We recommend signing up for the patient portal called "MyChart".  Sign up information is provided on this After Visit Summary.  MyChart is used to connect with patients for Virtual Visits (Telemedicine).  Patients are able to view lab/test results, encounter notes, upcoming appointments, etc.  Non-urgent messages can be sent to your provider as well.   To learn more about what you can do with MyChart, go to [email or web address].    Your next appointment:   6 months  The format for your next appointment:   In Person  Provider:   You may see Debbe Odea, MD or one of the following Advanced  Practice Providers on your designated Care Team:   Nicolasa Ducking, NP Eula Listen, PA-C Cadence Fransico Michael, [place]   Other Instructions

## 2021-08-04 ENCOUNTER — Other Ambulatory Visit: Payer: Self-pay | Admitting: Family Medicine

## 2021-08-04 DIAGNOSIS — I10 Essential (primary) hypertension: Secondary | ICD-10-CM

## 2021-08-06 ENCOUNTER — Telehealth: Payer: Self-pay | Admitting: Cardiology

## 2021-08-06 NOTE — Telephone Encounter (Signed)
Patient is calling with concerns received heart monitor in mail has questions wants to speak to nurse before calling customer service, please assist

## 2021-08-06 NOTE — Telephone Encounter (Signed)
Called and spoke with patient and she informed me that after applying the monitor her skin immediatly broke out into a red rash and she had to remove it immediatly. Patient called [employer] and they requested that she mail the monitor back to them even though she was unable to wear it.   Patient wanted to inform Dr. Garen Lah that she will not be able to wear the device.

## 2021-08-21 ENCOUNTER — Ambulatory Visit: Payer: Medicare Other | Admitting: Cardiology

## 2021-09-06 ENCOUNTER — Telehealth: Payer: Self-pay | Admitting: Cardiology

## 2021-09-06 NOTE — Telephone Encounter (Signed)
Called patient and left a VM requesting patient call me back. ?

## 2021-09-06 NOTE — Telephone Encounter (Signed)
Pt c/o swelling: STAT is pt has developed SOB within 24 hours ? ?If swelling, where is the swelling located?  Left leg ankle and foot ? ?How much weight have you gained and in what time span? Denies weight gain ? ?Have you gained 3 pounds in a day or 5 pounds in a week? no ? ?Do you have a log of your daily weights (if so, list)? no ? ?Are you currently taking a fluid pill? no ? ?Are you currently SOB? Yes, not terribly usually only when doing chores ? ?Have you traveled recently? No ? ? ?Patient calling concerned about swelling, scheduled an appt with C Berge on Tuesday but may not need.  Please call to discuss  ?

## 2021-09-08 ENCOUNTER — Emergency Department: Payer: Medicare Other

## 2021-09-08 ENCOUNTER — Emergency Department
Admission: EM | Admit: 2021-09-08 | Discharge: 2021-09-08 | Disposition: A | Discharge status: Home / Self Care | Payer: Medicare Other | Attending: Emergency Medicine | Admitting: Emergency Medicine

## 2021-09-08 ENCOUNTER — Other Ambulatory Visit: Payer: Self-pay

## 2021-09-08 DIAGNOSIS — R0989 Other specified symptoms and signs involving the circulatory and respiratory systems: Secondary | ICD-10-CM | POA: Insufficient documentation

## 2021-09-08 DIAGNOSIS — R0602 Shortness of breath: Secondary | ICD-10-CM | POA: Diagnosis not present

## 2021-09-08 DIAGNOSIS — I10 Essential (primary) hypertension: Secondary | ICD-10-CM | POA: Insufficient documentation

## 2021-09-08 DIAGNOSIS — R0789 Other chest pain: Secondary | ICD-10-CM | POA: Diagnosis not present

## 2021-09-08 DIAGNOSIS — J811 Chronic pulmonary edema: Secondary | ICD-10-CM | POA: Diagnosis not present

## 2021-09-08 DIAGNOSIS — J90 Pleural effusion, not elsewhere classified: Secondary | ICD-10-CM | POA: Diagnosis not present

## 2021-09-08 DIAGNOSIS — E119 Type 2 diabetes mellitus without complications: Secondary | ICD-10-CM | POA: Diagnosis not present

## 2021-09-08 HISTORY — DX: Essential (primary) hypertension: I10

## 2021-09-08 LAB — CBC
HCT: 47.8 % — ABNORMAL HIGH (ref 36.0–46.0)
Hemoglobin: 14.9 g/dL (ref 12.0–15.0)
MCH: 26.1 pg (ref 26.0–34.0)
MCHC: 31.2 g/dL (ref 30.0–36.0)
MCV: 83.7 fL (ref 80.0–100.0)
Platelets: 200 10*3/uL (ref 150–400)
RBC: 5.71 MIL/uL — ABNORMAL HIGH (ref 3.87–5.11)
RDW: 13.3 % (ref 11.5–15.5)
WBC: 10.4 10*3/uL (ref 4.0–10.5)
nRBC: 0 % (ref 0.0–0.2)

## 2021-09-08 LAB — COMPREHENSIVE METABOLIC PANEL
ALT: 16 U/L (ref 0–44)
AST: 18 U/L (ref 15–41)
Albumin: 4.2 g/dL (ref 3.5–5.0)
Alkaline Phosphatase: 88 U/L (ref 38–126)
Anion gap: 8 (ref 5–15)
BUN: 15 mg/dL (ref 8–23)
CO2: 30 mmol/L (ref 22–32)
Calcium: 9.3 mg/dL (ref 8.9–10.3)
Chloride: 100 mmol/L (ref 98–111)
Creatinine, Ser: 0.64 mg/dL (ref 0.44–1.00)
GFR, Estimated: >60 mL/min (ref >60)
Glucose, Bld: 219 mg/dL — ABNORMAL HIGH (ref 70–99)
Potassium: 3.7 mmol/L (ref 3.5–5.1)
Sodium: 138 mmol/L (ref 135–145)
Total Bilirubin: 0.7 mg/dL (ref 0.3–1.2)
Total Protein: 7.2 g/dL (ref 6.5–8.1)

## 2021-09-08 LAB — BRAIN NATRIURETIC PEPTIDE: B Natriuretic Peptide: 168 pg/mL — ABNORMAL HIGH (ref 0.0–100.0)

## 2021-09-08 LAB — TROPONIN I (HIGH SENSITIVITY): Troponin I (High Sensitivity): 15 ng/L (ref <18)

## 2021-09-08 MED ORDER — FUROSEMIDE 20 MG PO TABS: 20.0000 mg | ORAL_TABLET | Freq: Every day | ORAL | 11 refills | Status: DC

## 2021-09-08 MED ORDER — FUROSEMIDE 10 MG/ML IJ SOLN
40.0000 mg | Freq: Once | INTRAMUSCULAR | Status: AC
  Administered 2021-09-08: 40 mg via INTRAVENOUS
  Filled 2021-09-08: qty 4

## 2021-09-08 NOTE — ED Triage Notes (Signed)
Pt states she woke up and had SHOB, CP, and dizziness- pt states that she took her BP and it was 180/90s- pt states she also weighed herself and she has gained 7 lbs that she thinks may be fluid- pt states that her CP is on her L side of her chest and felt like a pressure ?

## 2021-09-08 NOTE — ED Provider Notes (Signed)
? ?[facility] ?Provider Note ? ? ? Event Date/Time  ? First MD Initiated Contact with Patient 09/08/21 [phone]   ?  (approximate) ? ? ?History  ? ?Shortness of Breath ? ? ?HPI ? ?Laurie Patel is a 88 y.o. female with a history of diabetes, hyperlipidemia, hypertension who presents with shortness of breath and chest discomfort when she woke up this morning.  She feels this may be related to fluid accumulation as she has gained 7 pounds.  Denies fevers chills or significant cough. ?  ? ? ?Physical Exam  ? ?Triage Vital Signs: ?ED Triage Vitals  ?Enc Vitals Group  ?   BP 09/08/21 0938 (!) 185/83  ?   Pulse Rate 09/08/21 0938 93  ?   Resp 09/08/21 0938 14  ?   Temp 09/08/21 0938 98.1 ?F (36.7 ?C)  ?   Temp Source 09/08/21 0938 Oral  ?   SpO2 09/08/21 0938 97 %  ?   Weight 09/08/21 0928 66.7 kg (147 lb)  ?   Height 09/08/21 0928 1.651 m (5\' 5" )  ?   Head Circumference --   ?   Peak Flow --   ?   Pain Score 09/08/21 0928 0  ?   Pain Loc --   ?   Pain Edu? --   ?   Excl. in GC? --   ? ? ?Most recent vital signs: ?Vitals:  ? 09/08/21 0938 09/08/21 1105  ?BP: (!) 185/83 (!) 171/86  ?Pulse: 93 86  ?Resp: 14 17  ?Temp: 98.1 ?F (36.7 ?C)   ?SpO2: 97% 95%  ? ? ? ?General: Awake, no distress.  ?CV:  Good peripheral perfusion.  Regular rate and rhythm ?Resp:  Normal effort.  Bibasilar Rales ?Abd:  No distention.  ?Other:  Mild edema bilaterally ? ? ?ED Results / Procedures / Treatments  ? ?Labs ?(all labs ordered are listed, but only abnormal results are displayed) ?Labs Reviewed  ?CBC - Abnormal; Notable for the following components:  ?    Result Value  ? RBC 5.71 (*)   ? HCT 47.8 (*)   ? All other components within normal limits  ?COMPREHENSIVE METABOLIC PANEL - Abnormal; Notable for the following components:  ? Glucose, Bld 219 (*)   ? All other components within normal limits  ?BRAIN NATRIURETIC PEPTIDE - Abnormal; Notable for the following components:  ? B Natriuretic Peptide 168.0 (*)   ? All other  components within normal limits  ?TROPONIN I (HIGH SENSITIVITY)  ? ? ? ?EKG ? ?ED ECG REPORT ?I, 09/10/21, the attending physician, personally viewed and interpreted this ECG. ? ?Date: 09/08/2021 ? ?Rhythm: normal sinus rhythm ?QRS Axis: normal ?Intervals: Left bundle branch block ?ST/T Wave abnormalities: Abnormal ?Narrative Interpretation: PVCs ? ? ? ?RADIOLOGY ?Chest x-ray viewed by me, possible pleural effusion ? ? ? ?PROCEDURES: ? ?Critical Care performed:  ? ?Procedures ? ? ?MEDICATIONS ORDERED IN ED: ?Medications  ?furosemide (LASIX) injection 40 mg (40 mg Intravenous Given 09/08/21 1040)  ? ? ? ?IMPRESSION / MDM / ASSESSMENT AND PLAN / ED COURSE  ?I reviewed the triage vital signs and the nursing notes. ? ?Patient presents with chest discomfort and shortness of breath as detailed above.  She is afebrile and in no acute distress at this time. ? ?Differential includes ACS, CHF/edema/effusion, less likely pneumonia/bronchospasm ? ?EKG is not changed from prior, pending high-sensitivity troponin ? ?BNP CBC CMP ordered.  Chest x-ray ordered.  We will place the patient  on the cardiac monitor. ? ? ?Lab work is notable for elevated BNP, normal troponin, normal CBC ? ?Chest x-ray demonstrates vascular congestion, likely pleural effusion ? ?Suspect likely CHF, fluid overload as the cause of her shortness of breath.  Will treat with IV Lasix and reevaluate, patient may require admission for echocardiogram, cardiology consultation, further diuresis ? ?Patient diuresing well, she is feeling improved and is breathing better per her report. ? ?I recommended admission for further diuresis and close cardiology consultation however She has cardiology follow-up arranged on Tuesday and hence she would like to try p.o. Lasix given close outpatient follow-up.  She agrees to return if any changes ? ?Her daughter agrees with this plan as well.  Strict return precautions discussed ? ?FINAL CLINICAL IMPRESSION(S) / ED DIAGNOSES   ? ?Final diagnoses:  ?Pulmonary vascular congestion  ?Pleural effusion  ? ? ? ?Rx / DC Orders  ? ?ED Discharge Orders   ? ?      Ordered  ?  furosemide (LASIX) 20 MG tablet  Daily       ? 09/08/21 1154  ? ?  ?  ? ?  ? ? ? ?Note:  This document was prepared using Dragon voice recognition software and may include unintentional dictation errors. ?  ?Jene Every, MD ?09/08/21 1249 ? ?

## 2021-09-08 NOTE — ED Notes (Signed)
Pt to xray at this time.

## 2021-09-08 NOTE — ED Notes (Signed)
See triage note. Pt complains of L chest tightness and SOB and dizziness with ambulation since this morning.  ?Pt is alert and oriented, NAD.  ?

## 2021-09-08 NOTE — ED Notes (Signed)
Pt assisted to toilet 

## 2021-09-08 NOTE — ED Notes (Signed)
ED provider at bedside.

## 2021-09-11 ENCOUNTER — Other Ambulatory Visit: Payer: Self-pay

## 2021-09-11 ENCOUNTER — Ambulatory Visit: Payer: Medicare Other | Admitting: Nurse Practitioner

## 2021-09-11 ENCOUNTER — Encounter: Payer: Self-pay | Admitting: Nurse Practitioner

## 2021-09-11 VITALS — BP 168/84 | HR 91 | Ht 65.5 in | Wt 147.5 lb

## 2021-09-11 DIAGNOSIS — I10 Essential (primary) hypertension: Secondary | ICD-10-CM | POA: Diagnosis not present

## 2021-09-11 NOTE — Patient Instructions (Signed)
Medication Instructions:  ? ?Your physician recommends that you continue on your current medications as directed. Please refer to the Current Medication list given to you today. ? ?*If you need a refill on your cardiac medications before your next appointment, please call your pharmacy* ? ? ?Lab Work: ? ?BMET today ? ?If you have labs (blood work) drawn today and your tests are completely normal, you will receive your results only by: ?MyChart Message (if you have MyChart) OR ?A paper copy in the mail ?If you have any lab test that is abnormal or we need to change your treatment, we will call you to review the results. ? ? ?Testing/Procedures: ? ?None ordered ? ? ?Follow-Up: ?At [facility], you and your health needs are our priority.  As part of our continuing mission to provide you with exceptional heart care, we have created designated Provider Care Teams.  These Care Teams include your primary Cardiologist (physician) and Advanced Practice Providers (APPs -  Physician Assistants and Nurse Practitioners) who all work together to provide you with the care you need, when you need it. ? ?We recommend signing up for the patient portal called "MyChart".  Sign up information is provided on this After Visit Summary.  MyChart is used to connect with patients for Virtual Visits (Telemedicine).  Patients are able to view lab/test results, encounter notes, upcoming appointments, etc.  Non-urgent messages can be sent to your provider as well.   ?To learn more about what you can do with MyChart, go to [email or web address].   ? ?Your next appointment:   ? ?1 month(s) with Dr. Azucena Cecil or Nicolasa Ducking, NP ? ?Keep follow up with Dr. Azucena Cecil in August  ? ?The format for your next appointment:   ?In Person ? ?Provider:   ?You may see Debbe Odea, MD or one of the following Advanced Practice Providers on your designated Care Team:   ?Nicolasa Ducking, NP ? ?

## 2021-09-11 NOTE — Progress Notes (Signed)
? ? ?Office Visit  ?  ?Patient Name: Laurie Patel ?Date of Encounter: 09/11/2021 ? ?Primary Care Provider:  Glori LuisSonnenberg, Eric G, MD ?Primary Cardiologist:  Laurie OdeaBrian Agbor-Etang, MD ? ?Chief Complaint  ?  ?88 year old female with a history of hypertension, whitecoat syndrome, diabetes, left bundle branch block, and left ventricular hypertrophy, who presents for follow-up related to recent ED visit for weight gain and edema. ? ?Past Medical History  ?  ?Past Medical History:  ?Diagnosis Date  ? Ankle fracture 10/14/2017  ? Chronic heart failure with preserved ejection fraction (HFpEF) (HCC)   ? Heart murmur   ? Hypertension   ? LBBB (left bundle branch block)   ? LVH (left ventricular hypertrophy)   ? a. 06/2016 Echo: EF 60-65%, no rwma, mild MR. PASP 34mmHg; b. 01/2021 Echo: EF 55-60%, sev assym septal hypertrophy (LV basal-mid septum up to 1.5cm). No SAM. No rwma. GrIDD. Nl RV fxn. Mild Ao sclerosis.  ? Palpitations   ? a. 07/2016 Holter: Avg HR 77 (54-117). 59 isolated PVCs. Rare PACs (<1%) w/ 3 atrial runs - longest 7 beats, fastest 170 bpm x 4 beats - PAT.  ? PNA (pneumonia) 02/16/2018  ? ?Past Surgical History:  ?Procedure Laterality Date  ? APPENDECTOMY  1986  ? EYE SURGERY Bilateral 1995 and 1999  ? Lens implants  ? WRIST SURGERY Left 1986  ? ORIF   ? ? ?Allergies ? ?Allergies  ?Allergen Reactions  ? Augmentin [Amoxicillin-Pot Clavulanate] Nausea And Vomiting  ? Prednisone   ? ? ?History of Present Illness  ?  ?88 year old female with the above past medical history including hypertension, whitecoat syndrome, diabetes, palpitations, left bundle branch block, and left ventricular hypertrophy.  She was previously evaluated in 2018 in the setting of palpitations with documentation of PVCs on prior ECG.  Echocardiogram was performed and showed normal LV function with mild mitral regurgitation.  Holter monitoring showed rare PVCs and PACs with 3 atrial runs, the longest lasting 7 beats with the fastest occurring at 170  bpm x 4 beats, felt to represent paroxysmal atrial tachycardia.    She reestablished care with our office in August 2022, following hospitalization earlier in the month related to dyspnea, marked hypertension (systolic in the 200s), and mild troponin elevation to a peak of 33.  Echocardiogram showed an EF of 55 to 60% with severe asymmetric septal hypertrophy, grade 1 diastolic dysfunction, and mild aortic atherosclerosis.  Though there was concern for possible hypertrophic cardiomyopathy, given patient's age, further work-up with cardiac MRI was not pursued. ? ?Laurie Patel was last seen in cardiology clinic on February 9, following recent ED visit related to palpitations.  In the ED, ECG shows sinus tachycardia at 150 bpm.  Troponin was normal.  A Zio monitor was placed at follow-up appointment however, she had trouble with the adhesive and mailed it back to the company.  She has not been mailed another device.  Late last week, she noted a 7 pound weight gain.  Over the subsequent 2 days, she noted worsening lower extremity swelling and dyspnea, prompting her to present to the ED on March 18.  There, she was hypertensive (185/83).  Labs notable for BNP of 168.  Troponin was normal at 15.  She was treated with IV Lasix with improvement in symptoms.  Admission was recommended however, patient preferred discharge was given a prescription for oral Lasix.  Since her ED visit, her weight has come back down to her usual dry weight of about 140 to  141 pounds on her home scale.  She has had no significant lower extremity swelling and denies chest Patel, dyspnea, palpitations, PND, orthopnea, dizziness, syncope, or early satiety.  ? ?Home Medications  ?  ?Current Outpatient Medications  ?Medication Sig Dispense Refill  ? aspirin 81 MG EC tablet Take 81 mg by mouth daily. Swallow whole.    ? cholecalciferol (VITAMIN D3) 25 MCG (1000 UNIT) tablet Take 1,000 Units by mouth daily.    ? FLUZONE HIGH-DOSE QUADRIVALENT 0.7 ML SUSY      ? furosemide (LASIX) 20 MG tablet Take 1 tablet (20 mg total) by mouth daily. 30 tablet 11  ? lisinopril (ZESTRIL) 10 MG tablet TAKE 1 TABLET BY MOUTH DAILY. 90 tablet 1  ? Multiple Vitamins-Calcium (ONE-A-DAY WOMENS PO) Take 1 tablet by mouth daily.    ? PFIZER COVID-19 VAC BIVALENT injection     ? ?No current facility-administered medications for this visit.  ?  ? ?Review of Systems  ?  ? Recent bout of weight gain, edema, and dyspnea which has been well managed with oral Lasix.  She denies chest Patel, palpitations, PND, orthopnea, dizziness, syncope, or early satiety.  All other systems reviewed and are otherwise negative except as noted above. ?  ? ?Physical Exam  ?  ?VS:  BP (!) 168/84   Pulse 91   Ht 5' 5.5" (1.664 m)   Wt 147 lb 8 oz (66.9 kg)   SpO2 96%   BMI 24.17 kg/m?  , BMI Body mass index is 24.17 kg/m?. ?    ?Vitals:  ? 09/11/21 1004 09/11/21 1314  ?BP: (!) 180/70 (!) 168/84  ?Pulse: 91   ?SpO2: 96%   ?  ?GEN: Well nourished, well developed, in no acute distress. ?HEENT: normal. ?Neck: Supple, no JVD, carotid bruits, or masses. ?Cardiac: RRR, 1/6 systolic murmur at the left lower sternal border, no rubs or gallops. No clubbing, cyanosis, trace bilateral malleolar edema.  Radials 2+/PT 1+ and equal bilaterally.  ?Respiratory:  Respirations regular and unlabored, clear to auscultation bilaterally. ?GI: Soft, nontender, nondistended, BS + x 4. ?MS: no deformity or atrophy. ?Skin: warm and dry, no rash. ?Neuro:  Strength and sensation are intact. ?Psych: Normal affect. ? ?Accessory Clinical Findings  ?  ?Lab Results  ?Component Value Date  ? WBC 10.4 09/08/2021  ? HGB 14.9 09/08/2021  ? HCT 47.8 (H) 09/08/2021  ? MCV 83.7 09/08/2021  ? PLT 200 09/08/2021  ? ?Lab Results  ?Component Value Date  ? CREATININE 0.64 09/08/2021  ? BUN 15 09/08/2021  ? NA 138 09/08/2021  ? K 3.7 09/08/2021  ? CL 100 09/08/2021  ? CO2 30 09/08/2021  ? ?Lab Results  ?Component Value Date  ? ALT 16 09/08/2021  ? AST 18  09/08/2021  ? ALKPHOS 88 09/08/2021  ? BILITOT 0.7 09/08/2021  ? ?Lab Results  ?Component Value Date  ? CHOL 173 03/14/2020  ? HDL 36.20 (L) 03/14/2020  ? LDLCALC 114 (H) 03/14/2020  ? LDLDIRECT 147.0 03/21/2016  ? TRIG 117.0 03/14/2020  ? CHOLHDL 5 03/14/2020  ?  ?Lab Results  ?Component Value Date  ? HGBA1C 6.5 (A) 07/24/2021  ? ? ?Assessment & Plan  ?  ?1.  Acute on chronic heart failure with preserved ejection fraction/left ventricular hypertrophy: Patient with prior history of asymmetric left ventricular hypertrophy (EF 55 to 6% with severe asymmetric septal LVH by echo in August 2022) and recent ED visit secondary to progressive lower extremity swelling, weight gain, and dyspnea.  ED evaluation notable for hypertension, BNP elevation 168, and vascular congestion on chest x-ray.  She responded well to IV Lasix in the emergency department and has since been on Lasix 20 mg daily.  She has very mild ankle swelling today and otherwise notes that her weight has returned to her previous baseline.  Her blood pressure is elevated today at 180/70 and on repeat, she is 168/84.  She says that on her home cuff, she is usually in the 120s to 130s, and has a history of whitecoat hypertension.  I will follow-up a basic metabolic panel today as she has been on Lasix for several days now.  Provided that labs are stable, will plan to continue Lasix 20 mg daily.  I did encourage her to continue to follow her blood pressure at home and contact us if she is trending greater than 140, as we would be likely to titrate her lisinopril.  As previously noted, with advanced age, we will continue conservative management for her LVH, and patient is agreeable with that plan. ? ?2.  Essential hypertension/whitecoat hypertension: Patient with a long history of elevated blood pressures in office settings and reports of normal pressures at home.  Today is another such example.  She will continue to follow her blood pressure at home and she  remains on lisinopril with recent addition of Lasix. ? ?3.  Disposition: Follow-up basic metabolic panel today.  Follow-up in clinic in 1 month or sooner if necessary. ? ? ?Nicolasa Ducking, NP ?09/11/2021, 1:16 PM ? ?

## 2021-09-12 LAB — BASIC METABOLIC PANEL
BUN/Creatinine Ratio: 25 (ref 12–28)
BUN: 18 mg/dL (ref 10–36)
CO2: 26 mmol/L (ref 20–29)
Calcium: 10.1 mg/dL (ref 8.7–10.3)
Chloride: 96 mmol/L (ref 96–106)
Creatinine, Ser: 0.73 mg/dL (ref 0.57–1.00)
Glucose: 224 mg/dL — ABNORMAL HIGH (ref 70–99)
Potassium: 4.1 mmol/L (ref 3.5–5.2)
Sodium: 142 mmol/L (ref 134–144)
eGFR: 77 mL/min/{1.73_m2} (ref >59)

## 2021-09-13 ENCOUNTER — Telehealth: Payer: Self-pay | Admitting: *Deleted

## 2021-09-13 NOTE — Telephone Encounter (Signed)
-----   Message from Creig Hines, NP sent at 09/12/2021 11:33 AM EDT ----- ?Kidney function and electrolytes look good.  Appears to be tolerating current dose of lasix well. ?

## 2021-09-13 NOTE — Telephone Encounter (Signed)
Left voicemail message to call back for review of results.  

## 2021-09-14 NOTE — Telephone Encounter (Signed)
Reviewed results with patient and she verbalized understanding with no further questions at this time.  

## 2021-10-11 ENCOUNTER — Ambulatory Visit: Payer: Medicare Other | Admitting: Cardiology

## 2021-10-11 ENCOUNTER — Encounter: Payer: Self-pay | Admitting: Cardiology

## 2021-10-11 VITALS — BP 142/78 | HR 85 | Ht 65.5 in | Wt 146.0 lb

## 2021-10-11 DIAGNOSIS — I10 Essential (primary) hypertension: Secondary | ICD-10-CM | POA: Diagnosis not present

## 2021-10-11 DIAGNOSIS — I5030 Unspecified diastolic (congestive) heart failure: Secondary | ICD-10-CM

## 2021-10-11 NOTE — Patient Instructions (Signed)
Medication Instructions:   Your physician recommends that you continue on your current medications as directed. Please refer to the Current Medication list given to you today.  *If you need a refill on your cardiac medications before your next appointment, please call your pharmacy*   Lab Work: None ordered If you have labs (blood work) drawn today and your tests are completely normal, you will receive your results only by: MyChart Message (if you have MyChart) OR A paper copy in the mail If you have any lab test that is abnormal or we need to change your treatment, we will call you to review the results.   Testing/Procedures: None ordered   Follow-Up: At [facility], you and your health needs are our priority.  As part of our continuing mission to provide you with exceptional heart care, we have created designated Provider Care Teams.  These Care Teams include your primary Cardiologist (physician) and Advanced Practice Providers (APPs -  Physician Assistants and Nurse Practitioners) who all work together to provide you with the care you need, when you need it.  We recommend signing up for the patient portal called "MyChart".  Sign up information is provided on this After Visit Summary.  MyChart is used to connect with patients for Virtual Visits (Telemedicine).  Patients are able to view lab/test results, encounter notes, upcoming appointments, etc.  Non-urgent messages can be sent to your provider as well.   To learn more about what you can do with MyChart, go to [email or web address].    Your next appointment:   6 month(s)  The format for your next appointment:   In Person  Provider:   You may see Brian Agbor-Etang, MD or one of the following Advanced Practice Providers on your designated Care Team:   Christopher Berge, NP Ryan Dunn, PA-C Cadence Furth, PA-C    Other Instructions   Important Information About Sugar       

## 2021-10-11 NOTE — Progress Notes (Signed)
?Cardiology Office Note:   ? ?Date:  10/11/2021  ? ?ID:  Laurie Patel, DOB [birth year], MRN [id number] ? ?PCP:  Laurie Haven, MD ?  ?[facility] Providers ?Cardiologist:  Laurie Sable, MD    ? ?Referring MD: Laurie Haven, MD  ? ?Chief Complaint  ?Patient presents with  ? Other  ?  1 month follow up -- Meds reviewed verbally with patient.   ? ? ?History of Present Illness:   ? ?Laurie Patel is a 88 y.o. female with a hx of HFpEF, hypertension, whitecoat syndrome, diabetes, LVH who presents for follow-up.   ? ?Being seen for hypertension, HFpEF.  Seen in the hospital for leg edema, given oral Lasix with improvement in symptoms.  Edema has been much improved.  Also takes a baby aspirin as discussed with her arthritis.  She feels well, denies chest pain, shortness of breath.  Blood pressures in the 130s at home.  Has no concerns at this time. ? ? ?Prior notes  ?echocardiogram 01/2021 showing preserved ejection fraction, asymmetric septal hypertrophy up to 1.5 cm, no LVOT obstruction, mild aortic valve sclerosis. ? ? ? ?Past Medical History:  ?Diagnosis Date  ? Ankle fracture 10/14/2017  ? Chronic heart failure with preserved ejection fraction (HFpEF) ([place])   ? Heart murmur   ? Hypertension   ? LBBB (left bundle branch block)   ? LVH (left ventricular hypertrophy)   ? a. 06/2016 Echo: EF 60-65%, no rwma, mild MR. PASP 94mmHg; b. 01/2021 Echo: EF 55-60%, sev assym septal hypertrophy (LV basal-mid septum up to 1.5cm). No SAM. No rwma. GrIDD. Nl RV fxn. Mild Ao sclerosis.  ? Palpitations   ? a. 07/2016 Holter: Avg HR 77 (54-117). 38 isolated PVCs. Rare PACs (<1%) w/ 3 atrial runs - longest 7 beats, fastest 170 bpm x 4 beats - PAT.  ? PNA (pneumonia) 02/16/2018  ? ? ?Past Surgical History:  ?Procedure Laterality Date  ? APPENDECTOMY  1986  ? EYE SURGERY Bilateral 1995 and 1999  ? Lens implants  ? WRIST SURGERY Left 1986  ? ORIF   ? ? ?Current Medications: ?Current Meds  ?Medication Sig  ? aspirin 81 MG EC  tablet Take 81 mg by mouth daily. Swallow whole.  ? cholecalciferol (VITAMIN D3) 25 MCG (1000 UNIT) tablet Take 1,000 Units by mouth daily.  ? FLUZONE HIGH-DOSE QUADRIVALENT 0.7 ML SUSY   ? furosemide (LASIX) 20 MG tablet Take 1 tablet (20 mg total) by mouth daily.  ? lisinopril (ZESTRIL) 10 MG tablet TAKE 1 TABLET BY MOUTH DAILY.  ? Multiple Vitamins-Calcium (ONE-A-DAY WOMENS PO) Take 1 tablet by mouth daily.  ? PFIZER COVID-19 VAC BIVALENT injection   ?  ? ?Allergies:   Augmentin [amoxicillin-pot clavulanate] and Prednisone  ? ?Social History  ? ?Socioeconomic History  ? Marital status: Widowed  ?  Spouse name: Not on file  ? Number of children: Not on file  ? Years of education: Not on file  ? Highest education level: Not on file  ?Occupational History  ? Not on file  ?Tobacco Use  ? Smoking status: Never  ? Smokeless tobacco: Never  ?Vaping Use  ? Vaping Use: Never used  ?Substance and Sexual Activity  ? Alcohol use: No  ?  Alcohol/week: 0.0 standard drinks  ? Drug use: No  ? Sexual activity: Never  ?Other Topics Concern  ? Not on file  ?Social History Narrative  ? Lives in [place]   ? Owns a [employer]   ?  1 dog  ? ?Social Determinants of Health  ? ?Financial Resource Strain: Low Risk   ? Difficulty of Paying Living Expenses: Not hard at all  ?Food Insecurity: No Food Insecurity  ? Worried About Charity fundraiser in the Last Year: Never true  ? Ran Out of Food in the Last Year: Never true  ?Transportation Needs: No Transportation Needs  ? Lack of Transportation (Medical): No  ? Lack of Transportation (Non-Medical): No  ?Physical Activity: Not on file  ?Stress: No Stress Concern Present  ? Feeling of Stress : Not at all  ?Social Connections: Unknown  ? Frequency of Communication with Friends and Family: More than three times a week  ? Frequency of Social Gatherings with Friends and Family: More than three times a week  ? Attends Religious Services: Not on file  ? Active Member of Clubs or Organizations:  Not on file  ? Attends Archivist Meetings: Not on file  ? Marital Status: Widowed  ?  ? ?Family History: ?The patient's family history includes COPD (age of onset: 69) in her father; Cancer (age of onset: 67) in her mother; Early death in her paternal grandmother; Heart disease in her maternal grandfather, maternal grandmother, and paternal grandfather; Heart disease (age of onset: 52) in her brother; Heart disease (age of onset: 30) in her father; Obesity in her paternal grandfather. ? ?ROS:   ?Please see the history of present illness.    ? All other systems reviewed and are negative. ? ?EKGs/Labs/Other Studies Reviewed:   ? ?The following studies were reviewed today: ? ? ?EKG:  EKG not ordered today.  ? ?Recent Labs: ?01/31/2021: Magnesium 2.1 ?07/10/2021: TSH 3.150 ?09/08/2021: ALT 16; B Natriuretic Peptide 168.0; Hemoglobin 14.9; Platelets 200 ?09/11/2021: BUN 18; Creatinine, Ser 0.73; Potassium 4.1; Sodium 142  ?Recent Lipid Panel ?   ?Component Value Date/Time  ? CHOL 173 03/14/2020 0941  ? TRIG 117.0 03/14/2020 0941  ? HDL 36.20 (L) 03/14/2020 0941  ? CHOLHDL 5 03/14/2020 0941  ? VLDL 23.4 03/14/2020 0941  ? LDLCALC 114 (H) 03/14/2020 0941  ? LDLDIRECT 147.0 03/21/2016 0926  ? ? ? ?Risk Assessment/Calculations:   ? ? ?    ? ?Physical Exam:   ? ?VS:  BP (!) 142/78 (BP Location: Left Arm, Patient Position: Sitting, Cuff Size: Normal)   Pulse 85   Ht 5' 5.5" (1.664 m)   Wt 146 lb (66.2 kg)   SpO2 95%   BMI 23.93 kg/m?    ? ?Wt Readings from Last 3 Encounters:  ?10/11/21 146 lb (66.2 kg)  ?09/11/21 147 lb 8 oz (66.9 kg)  ?09/08/21 147 lb (66.7 kg)  ?  ? ?GEN:  Well nourished, well developed in no acute distress ?HEENT: Normal ?NECK: No JVD; No carotid bruits ?CARDIAC: RRR, faint systolic murmur ?RESPIRATORY:  Clear to auscultation without rales, wheezing or rhonchi  ?ABDOMEN: Soft, non-tender, non-distended ?MUSCULOSKELETAL:  No edema; No deformity  ?SKIN: Warm and dry ?NEUROLOGIC:  Alert and  oriented x 3 ?PSYCHIATRIC:  Normal affect  ? ?ASSESSMENT:   ? ?1. Heart failure with preserved ejection fraction, unspecified HF chronicity ([place])   ?2. Essential hypertension   ? ?PLAN:   ? ?In order of problems listed above: ? ?HFpEF, asymmetric LVH, no plans for CMR.  EF 55 to 60%.  Patient is euvolemic, denies shortness of breath.  Continue Lasix 20 mg daily. ?Hypertension, BP elevated, she has whitecoat syndrome.  BP normal at home. Continue lisinopril 10 mg  daily. ? ?Follow-up in 6 months. ?  ? ?Medication Adjustments/Labs and Tests Ordered: ?Current medicines are reviewed at length with the patient today.  Concerns regarding medicines are outlined above.  ?No orders of the defined types were placed in this encounter. ? ? ?No orders of the defined types were placed in this encounter. ? ? ? ?Patient Instructions  ?Medication Instructions:  ?Your physician recommends that you continue on your current medications as directed. Please refer to the Current Medication list given to you today. ? ?*If you need a refill on your cardiac medications before your next appointment, please call your pharmacy* ? ? ?Lab Work: ?None ordered ?If you have labs (blood work) drawn today and your tests are completely normal, you will receive your results only by: ?MyChart Message (if you have MyChart) OR ?A paper copy in the mail ?If you have any lab test that is abnormal or we need to change your treatment, we will call you to review the results. ? ? ?Testing/Procedures: ?None ordered ? ? ?Follow-Up: ?At [facility], you and your health needs are our priority.  As part of our continuing mission to provide you with exceptional heart care, we have created designated Provider Care Teams.  These Care Teams include your primary Cardiologist (physician) and Advanced Practice Providers (APPs -  Physician Assistants and Nurse Practitioners) who all work together to provide you with the care you need, when you need it. ? ?We recommend  signing up for the patient portal called "MyChart".  Sign up information is provided on this After Visit Summary.  MyChart is used to connect with patients for Virtual Visits (Telemedicine).  Patients are able to view

## 2021-10-18 ENCOUNTER — Encounter: Payer: Self-pay | Admitting: Podiatry

## 2021-10-18 ENCOUNTER — Ambulatory Visit: Payer: Medicare Other | Admitting: Podiatry

## 2021-10-18 DIAGNOSIS — E119 Type 2 diabetes mellitus without complications: Secondary | ICD-10-CM | POA: Diagnosis not present

## 2021-10-18 DIAGNOSIS — M79676 Pain in unspecified toe(s): Secondary | ICD-10-CM

## 2021-10-18 DIAGNOSIS — B351 Tinea unguium: Secondary | ICD-10-CM

## 2021-10-18 NOTE — Progress Notes (Signed)
This patient returns to my office for at risk foot care.  This patient requires this care by a professional since this patient will be at risk due to having diabetes.   This patient is unable to cut nails herself since the patient cannot reach her nails.These nails are painful walking and wearing shoes.  This patient presents for at risk foot care today.  General Appearance  Alert, conversant and in no acute stress.  Vascular  Dorsalis pedis and posterior tibial  pulses are weakly  palpable  bilaterally.  Capillary return is within normal limits  bilaterally. Temperature is within normal limits  bilaterally.  Neurologic  Senn-Weinstein monofilament wire test within normal limits  bilaterally. Muscle power within normal limits bilaterally.  Nails Thick disfigured discolored nails with subungual debris  from hallux to fifth toenails left and 2-5 toenails right..  Absence of right hallux nail. No evidence of bacterial infection or drainage bilaterally.  Orthopedic  No limitations of motion  feet .  No crepitus or effusions noted.  No bony pathology or digital deformities noted.  Skin  normotropic skin with no porokeratosis noted bilaterally.  No signs of infections or ulcers noted.     Onychomycosis  Pain in right toes  Pain in left toes  Consent was obtained for treatment procedures.   Mechanical debridement of nails 1-5  Left and 2-5 right  performed with a nail nipper with the exception right hallux toenail..  Filed with dremel without incident.     Return office visit  10 weeks                    Told patient to return for periodic foot care and evaluation due to potential at risk complications.   Christyann Manolis DPM  

## 2021-11-12 ENCOUNTER — Telehealth: Payer: Self-pay

## 2021-11-12 MED ORDER — FUROSEMIDE 20 MG PO TABS: 20.0000 mg | ORAL_TABLET | Freq: Every day | ORAL | 1 refills | Status: DC

## 2021-11-12 NOTE — Telephone Encounter (Signed)
VM for patient to call me back.   Remberto Lienhard,cma

## 2021-11-12 NOTE — Telephone Encounter (Signed)
I sent a refill to her pharmacy.  Per her recent cardiology visits that she is supposed to be taking Lasix 20 mg once daily.  That is how I sent the refill in.  Please find out how often she is taking it twice daily.  Thanks.

## 2021-11-12 NOTE — Telephone Encounter (Signed)
Manuela Schwartz called from [place] to state patient has a prescription for Lasix 20mg , one per day.  Manuela Schwartz states patient was told by the ED that she could take one or two per day, so patient is out of medication.  Manuela Schwartz states patient needs a new prescription that allows her to take one or two per day.  Manuela Schwartz states her fax number is [phone].

## 2021-11-12 NOTE — Telephone Encounter (Signed)
I called and spoke with the patient and she stated she did not need a prescriptions she has 9 refills left, I called the pharmacy and cancelled the new RX for lasix and they understood.  Kriston Pasquarello,cma

## 2021-11-12 NOTE — Telephone Encounter (Signed)
Patient returned Nina's call.  Please call.

## 2022-01-10 ENCOUNTER — Ambulatory Visit: Payer: Medicare Other | Admitting: Podiatry

## 2022-01-10 ENCOUNTER — Encounter: Payer: Self-pay | Admitting: Podiatry

## 2022-01-10 DIAGNOSIS — E119 Type 2 diabetes mellitus without complications: Secondary | ICD-10-CM

## 2022-01-10 DIAGNOSIS — B351 Tinea unguium: Secondary | ICD-10-CM | POA: Diagnosis not present

## 2022-01-10 DIAGNOSIS — M79674 Pain in right toe(s): Secondary | ICD-10-CM | POA: Diagnosis not present

## 2022-01-10 DIAGNOSIS — M79675 Pain in left toe(s): Secondary | ICD-10-CM | POA: Diagnosis not present

## 2022-01-10 NOTE — Progress Notes (Signed)
This patient returns to my office for at risk foot care.  This patient requires this care by a professional since this patient will be at risk due to having diabetes.   This patient is unable to cut nails herself since the patient cannot reach her nails.These nails are painful walking and wearing shoes.  This patient presents for at risk foot care today.  General Appearance  Alert, conversant and in no acute stress.  Vascular  Dorsalis pedis and posterior tibial  pulses are weakly  palpable  bilaterally.  Capillary return is within normal limits  bilaterally. Temperature is within normal limits  bilaterally.  Neurologic  Senn-Weinstein monofilament wire test within normal limits  bilaterally. Muscle power within normal limits bilaterally.  Nails Thick disfigured discolored nails with subungual debris  from hallux to fifth toenails left and 2-5 toenails right..  Absence of right hallux nail. No evidence of bacterial infection or drainage bilaterally.  Orthopedic  No limitations of motion  feet .  No crepitus or effusions noted.  No bony pathology or digital deformities noted.  Skin  normotropic skin with no porokeratosis noted bilaterally.  No signs of infections or ulcers noted.     Onychomycosis  Pain in right toes  Pain in left toes  Consent was obtained for treatment procedures.   Mechanical debridement of nails 1-5  Left and 2-5 right  performed with a nail nipper with the exception right hallux toenail..  Filed with dremel without incident.  Filed sharp nail right hallux nail bed.   Return office visit  10 weeks                    Told patient to return for periodic foot care and evaluation due to potential at risk complications.   Maxie Slovacek DPM  

## 2022-01-15 ENCOUNTER — Ambulatory Visit (INDEPENDENT_AMBULATORY_CARE_PROVIDER_SITE_OTHER): Payer: Medicare Other | Admitting: Family Medicine

## 2022-01-15 ENCOUNTER — Encounter: Payer: Self-pay | Admitting: Family Medicine

## 2022-01-15 DIAGNOSIS — S80861S Insect bite (nonvenomous), right lower leg, sequela: Secondary | ICD-10-CM

## 2022-01-15 DIAGNOSIS — I5030 Unspecified diastolic (congestive) heart failure: Secondary | ICD-10-CM | POA: Insufficient documentation

## 2022-01-15 DIAGNOSIS — E119 Type 2 diabetes mellitus without complications: Secondary | ICD-10-CM

## 2022-01-15 DIAGNOSIS — I10 Essential (primary) hypertension: Secondary | ICD-10-CM | POA: Diagnosis not present

## 2022-01-15 DIAGNOSIS — W57XXXS Bitten or stung by nonvenomous insect and other nonvenomous arthropods, sequela: Secondary | ICD-10-CM

## 2022-01-15 DIAGNOSIS — W57XXXA Bitten or stung by nonvenomous insect and other nonvenomous arthropods, initial encounter: Secondary | ICD-10-CM | POA: Insufficient documentation

## 2022-01-15 DIAGNOSIS — I5032 Chronic diastolic (congestive) heart failure: Secondary | ICD-10-CM | POA: Diagnosis not present

## 2022-01-15 LAB — BASIC METABOLIC PANEL
BUN: 15 mg/dL (ref 6–23)
CO2: 27 mEq/L (ref 19–32)
Calcium: 9.5 mg/dL (ref 8.4–10.5)
Chloride: 101 mEq/L (ref 96–112)
Creatinine, Ser: 0.66 mg/dL (ref 0.40–1.20)
GFR: 76.18 mL/min (ref >60.00)
Glucose, Bld: 221 mg/dL — ABNORMAL HIGH (ref 70–99)
Potassium: 3.7 mEq/L (ref 3.5–5.1)
Sodium: 137 mEq/L (ref 135–145)

## 2022-01-15 LAB — HEMOGLOBIN A1C: Hgb A1c MFr Bld: 7.9 % — ABNORMAL HIGH (ref 4.6–6.5)

## 2022-01-15 NOTE — Assessment & Plan Note (Signed)
Patient reports history of tick bite on right lower leg many decades ago with recurrent issues with swelling and recurrent bull's-eye rash.  Discussed that it does not sound like she was appropriately treated for Lyme disease.  We will check Lyme titers and if positive consider treatment for Lyme disease with doxycycline.

## 2022-01-15 NOTE — Assessment & Plan Note (Signed)
Check A1c.  Continue diet and exercise. 

## 2022-01-15 NOTE — Assessment & Plan Note (Signed)
Well-controlled.  She will continue lisinopril 10 mg once daily and Lasix 20 mg daily.  Check BMP.

## 2022-01-15 NOTE — Progress Notes (Signed)
Marikay Alar, MD Phone: [phone]  Laurie Patel is a 88 y.o. female who presents today for f/u.  DIABETES Disease Monitoring: Blood Sugar ranges-not checking      Optho- UTD Medications: Compliance- not on medication, eats lots of lean meats and vegetables.  She stays as active as she can.  She does some weights.  HYPERTENSION Disease Monitoring Chest pain- no    Dyspnea- see below, none recently Medications Compliance-  taking lasix, lisinopril.  Edema- no BMET    Component Value Date/Time   NA 142 09/11/2021 1040   K 4.1 09/11/2021 1040   CL 96 09/11/2021 1040   CO2 26 09/11/2021 1040   GLUCOSE 224 (H) 09/11/2021 1040   GLUCOSE 219 (H) 09/08/2021 0930   BUN 18 09/11/2021 1040   CREATININE 0.73 09/11/2021 1040   CALCIUM 10.1 09/11/2021 1040   GFRNONAA >60 09/08/2021 0930   HFpEF: Patient ended up in the emergency department after developing some shortness of breath and swelling after eating lots of cream cheese and pastrami over a week long time frame in March.  She was treated with Lasix.  She notes since then she has had no shortness of breath.  She has been on Lasix 20 mg daily.  She has followed up with cardiology.  History of tick bite: Patient reports a history of a tick bite 66 years ago.  It was on her right leg.  Since then she has had trouble with this right foot swelling and occasionally having a recurrent bull's-eye rash.  She notes she had a bull's-eye rash 66 years ago with a tick bite.  She thinks they treated her with penicillin at that time.   Social History   Tobacco Use  Smoking Status Never  Smokeless Tobacco Never    Current Outpatient Medications on File Prior to Visit  Medication Sig Dispense Refill   aspirin 81 MG EC tablet Take 81 mg by mouth daily. Swallow whole.     cholecalciferol (VITAMIN D3) 25 MCG (1000 UNIT) tablet Take 1,000 Units by mouth daily.     furosemide (LASIX) 20 MG tablet Take 1 tablet (20 mg total) by mouth daily. 90  tablet 1   lisinopril (ZESTRIL) 10 MG tablet TAKE 1 TABLET BY MOUTH DAILY. 90 tablet 1   Multiple Vitamins-Calcium (ONE-A-DAY WOMENS PO) Take 1 tablet by mouth daily.     FLUZONE HIGH-DOSE QUADRIVALENT 0.7 ML SUSY  (Patient not taking: Reported on 01/15/2022)     PFIZER COVID-19 VAC BIVALENT injection  (Patient not taking: Reported on 01/15/2022)     No current facility-administered medications on file prior to visit.     ROS see history of present illness  Objective  Physical Exam Vitals:   01/15/22 0852  BP: 140/80  Pulse: 82  Temp: 98.1 F (36.7 C)  SpO2: 94%    BP Readings from Last 3 Encounters:  01/15/22 140/80  10/11/21 (!) 142/78  09/11/21 (!) 168/84   Wt Readings from Last 3 Encounters:  01/15/22 144 lb 9.6 oz (65.6 kg)  10/11/21 146 lb (66.2 kg)  09/11/21 147 lb 8 oz (66.9 kg)    Physical Exam Constitutional:      General: She is not in acute distress.    Appearance: She is not diaphoretic.  Cardiovascular:     Rate and Rhythm: Normal rate and regular rhythm.     Heart sounds: Normal heart sounds.  Pulmonary:     Effort: Pulmonary effort is normal.     Breath  sounds: Normal breath sounds.  Musculoskeletal:     Right lower leg: No edema.     Left lower leg: No edema.  Skin:    General: Skin is warm and dry.  Neurological:     Mental Status: She is alert.      Assessment/Plan: Please see individual problem list.  Problem List Items Addressed This Visit     (HFpEF) heart failure with preserved ejection fraction (HCC) (Chronic)    Appears to be euvolemic.  She will continue Lasix 20 mg daily.  We will check a BMP.  She will see cardiology for follow-up as planned.      Diabetes (HCC) (Chronic)    Check A1c.  Continue diet and exercise.      Relevant Orders   HgB A1c   HTN (hypertension) (Chronic)    Well-controlled.  She will continue lisinopril 10 mg once daily and Lasix 20 mg daily.  Check BMP.      Relevant Orders   Basic Metabolic  Panel (BMET)   History of tick bite of right lower leg    Patient reports history of tick bite on right lower leg many decades ago with recurrent issues with swelling and recurrent bull's-eye rash.  Discussed that it does not sound like she was appropriately treated for Lyme disease.  We will check Lyme titers and if positive consider treatment for Lyme disease with doxycycline.      Relevant Orders   B. burgdorfi antibodies    Return in about 6 months (around 07/18/2022) for Hypertension/diabetes.   Marikay Alar, MD [facility]

## 2022-01-15 NOTE — Patient Instructions (Signed)
Nice to see you. We will get lab work today and contact you with the results. Please try to limit your sodium intake.  Please continue to monitor your weight.  If it trends up more than 2 pounds overnight or more than 5 pounds in a week please let us or your cardiologist know.

## 2022-01-15 NOTE — Assessment & Plan Note (Signed)
Appears to be euvolemic.  She will continue Lasix 20 mg daily.  We will check a BMP.  She will see cardiology for follow-up as planned.

## 2022-01-16 LAB — B. BURGDORFI ANTIBODIES: B burgdorferi Ab IgG+IgM: <0.9 index

## 2022-01-29 ENCOUNTER — Ambulatory Visit: Payer: Medicare Other | Admitting: Cardiology

## 2022-02-01 ENCOUNTER — Emergency Department: Payer: Medicare Other

## 2022-02-01 ENCOUNTER — Encounter: Payer: Self-pay | Admitting: Medical Oncology

## 2022-02-01 ENCOUNTER — Other Ambulatory Visit: Payer: Self-pay

## 2022-02-01 ENCOUNTER — Inpatient Hospital Stay
Admission: EM | Admit: 2022-02-01 | Discharge: 2022-02-08 | DRG: 481 | Disposition: A | Discharge status: Skilled Nursing Facility | Payer: Medicare Other | Attending: Internal Medicine | Admitting: Internal Medicine

## 2022-02-01 DIAGNOSIS — Z888 Allergy status to other drugs, medicaments and biological substances status: Secondary | ICD-10-CM | POA: Diagnosis not present

## 2022-02-01 DIAGNOSIS — K6389 Other specified diseases of intestine: Secondary | ICD-10-CM | POA: Diagnosis not present

## 2022-02-01 DIAGNOSIS — S5001XA Contusion of right elbow, initial encounter: Secondary | ICD-10-CM | POA: Diagnosis present

## 2022-02-01 DIAGNOSIS — R011 Cardiac murmur, unspecified: Secondary | ICD-10-CM | POA: Diagnosis present

## 2022-02-01 DIAGNOSIS — Z7401 Bed confinement status: Secondary | ICD-10-CM | POA: Diagnosis not present

## 2022-02-01 DIAGNOSIS — I480 Paroxysmal atrial fibrillation: Secondary | ICD-10-CM | POA: Clinically undetermined

## 2022-02-01 DIAGNOSIS — I5032 Chronic diastolic (congestive) heart failure: Secondary | ICD-10-CM | POA: Diagnosis not present

## 2022-02-01 DIAGNOSIS — D62 Acute posthemorrhagic anemia: Secondary | ICD-10-CM | POA: Diagnosis not present

## 2022-02-01 DIAGNOSIS — S299XXA Unspecified injury of thorax, initial encounter: Secondary | ICD-10-CM | POA: Diagnosis not present

## 2022-02-01 DIAGNOSIS — Z9049 Acquired absence of other specified parts of digestive tract: Secondary | ICD-10-CM

## 2022-02-01 DIAGNOSIS — I110 Hypertensive heart disease with heart failure: Secondary | ICD-10-CM | POA: Diagnosis present

## 2022-02-01 DIAGNOSIS — Z743 Need for continuous supervision: Secondary | ICD-10-CM | POA: Diagnosis not present

## 2022-02-01 DIAGNOSIS — S42295A Other nondisplaced fracture of upper end of left humerus, initial encounter for closed fracture: Secondary | ICD-10-CM | POA: Diagnosis not present

## 2022-02-01 DIAGNOSIS — Z79899 Other long term (current) drug therapy: Secondary | ICD-10-CM

## 2022-02-01 DIAGNOSIS — W19XXXD Unspecified fall, subsequent encounter: Secondary | ICD-10-CM | POA: Diagnosis not present

## 2022-02-01 DIAGNOSIS — W010XXA Fall on same level from slipping, tripping and stumbling without subsequent striking against object, initial encounter: Secondary | ICD-10-CM | POA: Diagnosis not present

## 2022-02-01 DIAGNOSIS — S72142A Displaced intertrochanteric fracture of left femur, initial encounter for closed fracture: Secondary | ICD-10-CM | POA: Diagnosis not present

## 2022-02-01 DIAGNOSIS — I447 Left bundle-branch block, unspecified: Secondary | ICD-10-CM | POA: Diagnosis present

## 2022-02-01 DIAGNOSIS — R911 Solitary pulmonary nodule: Secondary | ICD-10-CM | POA: Diagnosis not present

## 2022-02-01 DIAGNOSIS — I248 Other forms of acute ischemic heart disease: Secondary | ICD-10-CM | POA: Diagnosis not present

## 2022-02-01 DIAGNOSIS — J9811 Atelectasis: Secondary | ICD-10-CM | POA: Clinically undetermined

## 2022-02-01 DIAGNOSIS — E1165 Type 2 diabetes mellitus with hyperglycemia: Secondary | ICD-10-CM | POA: Diagnosis present

## 2022-02-01 DIAGNOSIS — M25552 Pain in left hip: Secondary | ICD-10-CM | POA: Diagnosis present

## 2022-02-01 DIAGNOSIS — E785 Hyperlipidemia, unspecified: Secondary | ICD-10-CM | POA: Diagnosis not present

## 2022-02-01 DIAGNOSIS — Z01810 Encounter for preprocedural cardiovascular examination: Secondary | ICD-10-CM | POA: Diagnosis not present

## 2022-02-01 DIAGNOSIS — R6889 Other general symptoms and signs: Secondary | ICD-10-CM | POA: Diagnosis not present

## 2022-02-01 DIAGNOSIS — I4891 Unspecified atrial fibrillation: Secondary | ICD-10-CM | POA: Diagnosis not present

## 2022-02-01 DIAGNOSIS — I5030 Unspecified diastolic (congestive) heart failure: Secondary | ICD-10-CM

## 2022-02-01 DIAGNOSIS — R000 Tachycardia, unspecified: Secondary | ICD-10-CM | POA: Diagnosis not present

## 2022-02-01 DIAGNOSIS — S4992XA Unspecified injury of left shoulder and upper arm, initial encounter: Secondary | ICD-10-CM | POA: Diagnosis not present

## 2022-02-01 DIAGNOSIS — Z880 Allergy status to penicillin: Secondary | ICD-10-CM

## 2022-02-01 DIAGNOSIS — S72145A Nondisplaced intertrochanteric fracture of left femur, initial encounter for closed fracture: Secondary | ICD-10-CM | POA: Diagnosis not present

## 2022-02-01 DIAGNOSIS — D500 Iron deficiency anemia secondary to blood loss (chronic): Secondary | ICD-10-CM | POA: Diagnosis not present

## 2022-02-01 DIAGNOSIS — M19012 Primary osteoarthritis, left shoulder: Secondary | ICD-10-CM | POA: Diagnosis not present

## 2022-02-01 DIAGNOSIS — I10 Essential (primary) hypertension: Secondary | ICD-10-CM | POA: Diagnosis not present

## 2022-02-01 DIAGNOSIS — M47816 Spondylosis without myelopathy or radiculopathy, lumbar region: Secondary | ICD-10-CM | POA: Diagnosis not present

## 2022-02-01 DIAGNOSIS — S42202D Unspecified fracture of upper end of left humerus, subsequent encounter for fracture with routine healing: Secondary | ICD-10-CM | POA: Diagnosis not present

## 2022-02-01 DIAGNOSIS — E278 Other specified disorders of adrenal gland: Secondary | ICD-10-CM | POA: Diagnosis not present

## 2022-02-01 DIAGNOSIS — E7800 Pure hypercholesterolemia, unspecified: Secondary | ICD-10-CM | POA: Diagnosis not present

## 2022-02-01 DIAGNOSIS — I2510 Atherosclerotic heart disease of native coronary artery without angina pectoris: Secondary | ICD-10-CM | POA: Diagnosis present

## 2022-02-01 DIAGNOSIS — D72829 Elevated white blood cell count, unspecified: Secondary | ICD-10-CM | POA: Diagnosis present

## 2022-02-01 DIAGNOSIS — I517 Cardiomegaly: Secondary | ICD-10-CM | POA: Diagnosis not present

## 2022-02-01 DIAGNOSIS — R404 Transient alteration of awareness: Secondary | ICD-10-CM | POA: Diagnosis not present

## 2022-02-01 DIAGNOSIS — S42202A Unspecified fracture of upper end of left humerus, initial encounter for closed fracture: Secondary | ICD-10-CM | POA: Diagnosis not present

## 2022-02-01 DIAGNOSIS — Y92009 Unspecified place in unspecified non-institutional (private) residence as the place of occurrence of the external cause: Secondary | ICD-10-CM | POA: Diagnosis not present

## 2022-02-01 DIAGNOSIS — Z8249 Family history of ischemic heart disease and other diseases of the circulatory system: Secondary | ICD-10-CM | POA: Diagnosis not present

## 2022-02-01 DIAGNOSIS — E119 Type 2 diabetes mellitus without complications: Secondary | ICD-10-CM | POA: Diagnosis not present

## 2022-02-01 DIAGNOSIS — R0902 Hypoxemia: Secondary | ICD-10-CM | POA: Diagnosis not present

## 2022-02-01 DIAGNOSIS — Z8701 Personal history of pneumonia (recurrent): Secondary | ICD-10-CM | POA: Diagnosis not present

## 2022-02-01 DIAGNOSIS — R52 Pain, unspecified: Secondary | ICD-10-CM | POA: Diagnosis not present

## 2022-02-01 DIAGNOSIS — Z5320 Procedure and treatment not carried out because of patient's decision for unspecified reasons: Secondary | ICD-10-CM | POA: Diagnosis not present

## 2022-02-01 DIAGNOSIS — S72002A Fracture of unspecified part of neck of left femur, initial encounter for closed fracture: Secondary | ICD-10-CM | POA: Diagnosis not present

## 2022-02-01 DIAGNOSIS — Z961 Presence of intraocular lens: Secondary | ICD-10-CM | POA: Diagnosis present

## 2022-02-01 DIAGNOSIS — M954 Acquired deformity of chest and rib: Secondary | ICD-10-CM | POA: Diagnosis not present

## 2022-02-01 DIAGNOSIS — S72142D Displaced intertrochanteric fracture of left femur, subsequent encounter for closed fracture with routine healing: Secondary | ICD-10-CM | POA: Diagnosis not present

## 2022-02-01 DIAGNOSIS — Z7982 Long term (current) use of aspirin: Secondary | ICD-10-CM

## 2022-02-01 DIAGNOSIS — M810 Age-related osteoporosis without current pathological fracture: Secondary | ICD-10-CM | POA: Diagnosis not present

## 2022-02-01 DIAGNOSIS — E559 Vitamin D deficiency, unspecified: Secondary | ICD-10-CM | POA: Diagnosis not present

## 2022-02-01 DIAGNOSIS — W19XXXA Unspecified fall, initial encounter: Secondary | ICD-10-CM | POA: Diagnosis not present

## 2022-02-01 DIAGNOSIS — J90 Pleural effusion, not elsewhere classified: Secondary | ICD-10-CM | POA: Diagnosis not present

## 2022-02-01 DIAGNOSIS — E782 Mixed hyperlipidemia: Secondary | ICD-10-CM | POA: Diagnosis not present

## 2022-02-01 LAB — BASIC METABOLIC PANEL
Anion gap: 10 (ref 5–15)
BUN: 19 mg/dL (ref 8–23)
CO2: 22 mmol/L (ref 22–32)
Calcium: 9.2 mg/dL (ref 8.9–10.3)
Chloride: 103 mmol/L (ref 98–111)
Creatinine, Ser: 0.71 mg/dL (ref 0.44–1.00)
GFR, Estimated: >60 mL/min (ref >60)
Glucose, Bld: 247 mg/dL — ABNORMAL HIGH (ref 70–99)
Potassium: 4.5 mmol/L (ref 3.5–5.1)
Sodium: 135 mmol/L (ref 135–145)

## 2022-02-01 LAB — CBC WITH DIFFERENTIAL/PLATELET
Abs Immature Granulocytes: 0.09 10*3/uL — ABNORMAL HIGH (ref 0.00–0.07)
Basophils Absolute: 0.1 10*3/uL (ref 0.0–0.1)
Basophils Relative: 0 %
Eosinophils Absolute: 0 10*3/uL (ref 0.0–0.5)
Eosinophils Relative: 0 %
HCT: 41.7 % (ref 36.0–46.0)
Hemoglobin: 12.5 g/dL (ref 12.0–15.0)
Immature Granulocytes: 1 %
Lymphocytes Relative: 3 %
Lymphs Abs: 0.5 10*3/uL — ABNORMAL LOW (ref 0.7–4.0)
MCH: 27 pg (ref 26.0–34.0)
MCHC: 30 g/dL (ref 30.0–36.0)
MCV: 90.1 fL (ref 80.0–100.0)
Monocytes Absolute: 0.7 10*3/uL (ref 0.1–1.0)
Monocytes Relative: 4 %
Neutro Abs: 17.5 10*3/uL — ABNORMAL HIGH (ref 1.7–7.7)
Neutrophils Relative %: 92 %
Platelets: 140 10*3/uL — ABNORMAL LOW (ref 150–400)
RBC: 4.63 MIL/uL (ref 3.87–5.11)
RDW: 13.4 % (ref 11.5–15.5)
WBC: 18.9 10*3/uL — ABNORMAL HIGH (ref 4.0–10.5)
nRBC: 0 % (ref 0.0–0.2)

## 2022-02-01 LAB — TYPE AND SCREEN

## 2022-02-01 MED ORDER — MORPHINE SULFATE (PF) 2 MG/ML IV SOLN
2.0000 mg | INTRAVENOUS | Status: DC | PRN
  Administered 2022-02-01: 2 mg via INTRAVENOUS
  Filled 2022-02-01: qty 1

## 2022-02-01 MED ORDER — FUROSEMIDE 20 MG PO TABS
20.0000 mg | ORAL_TABLET | Freq: Every day | ORAL | Status: DC
  Administered 2022-02-02 – 2022-02-08 (×7): 20 mg via ORAL
  Filled 2022-02-01 (×7): qty 1

## 2022-02-01 MED ORDER — SODIUM CHLORIDE 0.9 % IV SOLN: INTRAVENOUS | Status: DC

## 2022-02-01 MED ORDER — TRAZODONE HCL 50 MG PO TABS: 25.0000 mg | ORAL_TABLET | Freq: Every evening | ORAL | Status: DC | PRN

## 2022-02-01 MED ORDER — HYDROCODONE-ACETAMINOPHEN 5-325 MG PO TABS
1.0000 | ORAL_TABLET | Freq: Four times a day (QID) | ORAL | Status: DC | PRN
  Administered 2022-02-02: 2 via ORAL
  Filled 2022-02-01: qty 2

## 2022-02-01 MED ORDER — ACETAMINOPHEN 325 MG PO TABS: 650.0000 mg | ORAL_TABLET | ORAL | Status: DC | PRN

## 2022-02-01 MED ORDER — ONDANSETRON HCL 4 MG/2ML IJ SOLN: 4.0000 mg | INTRAMUSCULAR | Status: DC | PRN

## 2022-02-01 MED ORDER — ONDANSETRON HCL 4 MG/2ML IJ SOLN
4.0000 mg | Freq: Once | INTRAMUSCULAR | Status: AC
Start: 2022-02-01 — End: 2022-02-01
  Administered 2022-02-01: 4 mg via INTRAVENOUS
  Filled 2022-02-01: qty 2

## 2022-02-01 MED ORDER — ACETAMINOPHEN 650 MG RE SUPP: 650.0000 mg | RECTAL | Status: DC | PRN

## 2022-02-01 MED ORDER — ADULT MULTIVITAMIN W/MINERALS CH
ORAL_TABLET | Freq: Every day | ORAL | Status: DC
  Administered 2022-02-02 – 2022-02-05 (×3): 1 via ORAL
  Filled 2022-02-01 (×7): qty 1

## 2022-02-01 MED ORDER — MAGNESIUM HYDROXIDE 400 MG/5ML PO SUSP: 30.0000 mL | Freq: Every day | ORAL | Status: DC | PRN

## 2022-02-01 MED ORDER — LISINOPRIL 10 MG PO TABS
10.0000 mg | ORAL_TABLET | Freq: Every day | ORAL | Status: DC
Start: 2022-02-02 — End: 2022-02-02

## 2022-02-01 MED ORDER — CEFAZOLIN SODIUM-DEXTROSE 2-4 GM/100ML-% IV SOLN
2.0000 g | INTRAVENOUS | Status: AC
  Administered 2022-02-02: 2 g via INTRAVENOUS

## 2022-02-01 MED ORDER — VITAMIN D 25 MCG (1000 UNIT) PO TABS
1000.0000 [IU] | ORAL_TABLET | Freq: Every day | ORAL | Status: DC
  Administered 2022-02-02 – 2022-02-08 (×6): 1000 [IU] via ORAL
  Filled 2022-02-01 (×7): qty 1

## 2022-02-01 MED ORDER — MORPHINE SULFATE (PF) 2 MG/ML IV SOLN: 0.5000 mg | INTRAVENOUS | Status: DC | PRN

## 2022-02-01 NOTE — ED Provider Triage Note (Signed)
Emergency Medicine Provider Triage Evaluation Note  Laurie Patel , a 88 y.o. female  was evaluated in triage.  Pt complains of left shoulder, left upper arm and left hip pain after mechanical, non-syncopal fall at home this afternoon. She typically uses her walker but had her hands full and lost her balance. Denies loss of consciousness, neck or back pain.  Physical Exam  BP (!) 189/69 (BP Location: Right Arm)   Pulse 89   Temp 98.1 F (36.7 C) (Oral)   Resp 18   Ht 5' 5.5" (1.664 m)   Wt 65 kg   SpO2 93%   BMI 23.48 kg/m  Gen:   Awake, no distress   Resp:  Normal effort  MSK:   Moves extremities without difficulty  Other:    Medical Decision Making  Medically screening exam initiated at 6:20 PM.  Appropriate orders placed.  Laurie Patel was informed that the remainder of the evaluation will be completed by another provider, this initial triage assessment does not replace that evaluation, and the importance of remaining in the ED until their evaluation is complete.    Laurie Pester, FNP 02/01/22 [name]

## 2022-02-01 NOTE — ED Provider Notes (Signed)
[facility] Provider Note    Event Date/Time   First MD Initiated Contact with Patient 02/01/22 2044     (approximate)   History   Fall   HPI  TAMRYN POPKO is a 88 y.o. female who presents to the ER for evaluation of left hip pain and left shoulder pain that occurred after she was walking back into her house.  States that she typically uses a walker for ambulation but was trying to carry things with both hands.  States that she intermittently loses her balance and did not have anything to grab onto because her hands were full.  She landed on her left side.  Did not hit her head.  Denies any neck pain.  States the pain in her shoulder and hip are mild to moderate.  She does not take any blood thinners.     Physical Exam   Triage Vital Signs: ED Triage Vitals [02/01/22 1819]  Enc Vitals Group     BP (!) 189/69     Pulse Rate 89     Resp 18     Temp 98.1 F (36.7 C)     Temp Source Oral     SpO2 93 %     Weight 143 lb 4.8 oz (65 kg)     Height 5' 5.5" (1.664 m)     Head Circumference      Peak Flow      Pain Score 4     Pain Loc      Pain Edu?      Excl. in GC?     Most recent vital signs: Vitals:   02/01/22 1819 02/01/22 2001  BP: (!) 189/69 (!) 147/56  Pulse: 89 72  Resp: 18 18  Temp: 98.1 F (36.7 C) 98.4 F (36.9 C)  SpO2: 93% 93%     Constitutional: Alert  Jollie: Conjunctivae are normal.  Head: Atraumatic. Nose: No congestion/rhinnorhea. Mouth/Throat: Mucous membranes are moist.   Neck: Painless ROM.  Cardiovascular:   Good peripheral circulation. Respiratory: Normal respiratory effort.  No retractions.  Gastrointestinal: Soft and nontender.  Musculoskeletal: Vascular intact throughout.  Pain with palpation of the proximal left arm.  Compartments soft.  Left leg held and external rotation.  Thigh compartment soft.  Slightly shortened. Neurologic:  MAE spontaneously. No gross focal neurologic deficits are appreciated.  Skin:   Skin is warm, dry and intact. No rash noted. Psychiatric: Mood and affect are normal. Speech and behavior are normal.    ED Results / Procedures / Treatments   Labs (all labs ordered are listed, but only abnormal results are displayed) Labs Reviewed  CBC WITH DIFFERENTIAL/PLATELET  BASIC METABOLIC PANEL  TYPE AND SCREEN     EKG  ED ECG REPORT I, Willy Eddy, the attending physician, personally viewed and interpreted this ECG.   Date: 02/01/2022  EKG Time: 21:36  Rate: 60  Rhythm: sinus  Axis: normal  Intervals:lbbb  ST&T Change: no stemi, no depression, no sgarbossa    RADIOLOGY Please see ED Course for my review and interpretation.  I personally reviewed all radiographic images ordered to evaluate for the above acute complaints and reviewed radiology reports and findings.  These findings were personally discussed with the patient.  Please see medical record for radiology report.    PROCEDURES:  Critical Care performed: No  Procedures   MEDICATIONS ORDERED IN ED: Medications  morphine (PF) 2 MG/ML injection 2 mg (2 mg Intravenous Given 02/01/22 2156)  ondansetron (  ZOFRAN) injection 4 mg (4 mg Intravenous Given 02/01/22 2155)     IMPRESSION / MDM / ASSESSMENT AND PLAN / ED COURSE  I reviewed the triage vital signs and the nursing notes.                              Differential diagnosis includes, but is not limited to, pressure, contusion, dislocation, hematoma  Patient presents to the ER for evaluation of symptoms as described above.  This presenting complaint could reflect a potentially life-threatening illness therefore the patient will be placed on continuous pulse oximetry and telemetry for monitoring.  Laboratory evaluation will be sent to evaluate for the above complaints.  Imaging shows evidence of acute left intertrochanteric fracture as well as a left impacted humeral head fracture.  She is neurovascular intact.  Compartments soft.  Discussed  case in consultation with Dr. Martha Clan of orthopedics.  Patient given IV morphine for pain.  Will be admitted to the hospital.  I consulted hospitalist.      FINAL CLINICAL IMPRESSION(S) / ED DIAGNOSES   Final diagnoses:  Closed nondisplaced intertrochanteric fracture of left femur, initial encounter (HCC)  Other closed nondisplaced fracture of proximal end of left humerus, initial encounter     Rx / DC Orders   ED Discharge Orders     None        Note:  This document was prepared using Dragon voice recognition software and may include unintentional dictation errors.    Willy Eddy, MD 02/01/22 2239

## 2022-02-01 NOTE — H&P (Signed)
Laurie Patel   PATIENT NAME: Laurie Patel    MR#:  [id number]  DATE OF BIRTH:  1927/12/05  DATE OF ADMISSION:  02/01/2022  PRIMARY CARE PHYSICIAN: Glori Luis, MD   Patient is coming from: Home  REQUESTING/REFERRING PHYSICIAN: Willy Eddy, MD  CHIEF COMPLAINT:   Chief Complaint  Patient presents with   Fall    HISTORY OF PRESENT ILLNESS:  Laurie Patel is a 88 y.o. Caucasian female with medical history significant for hypertension, relationship who presented to the emergency room with acute onset of accidental mechanical fall with subsequent left hip pain.  The patient was coming back from her porch holding a planned and 1 and and her other hand was busy when she lost balance and fell.  She denies any presyncope or syncope.  No headache or dizziness or blurred vision.  No paresthesias or focal muscle weakness.  She denies any chest pain or dyspnea or cough or wheezing.  No nausea or vomiting or abdominal pain.  No dysuria, oliguria or hematuria or flank pain.  She tried to crawl to drag herself to the phone and had subsequent mild right elbow contusion.  ED Course: Upon presentation to the emergency room, BP was 189/69 and later 147/56 with otherwise normal vital signs.  Labs revealed a blood glucose of 247 with otherwise normal vital signs.  CBC showed leukocytosis of 18.9 with neutrophilia. EKG as reviewed by me : Twelve-lead EKG revealed sinus rhythm with a rate of 59 with left bundle branch block.  We will continue Imaging: Portable chest x-ray showed cardiomegaly without acute findings.  Left hip x-ray showed comminuted air cardiac fracture in the proximal left femur.  Left shoulder x-ray showed deformity in the neck of the left humerus suggesting recent impacted fracture less likely the possibility would be residual change from previous injury.  The patient was given 4 mg of IV Zofran and 2 mg of IV morphine sulfate.  Dr. Martha Clan was notified about the  patient.  She will be admitted to a medical bed for further evaluation and management. PAST MEDICAL HISTORY:   Past Medical History:  Diagnosis Date   Ankle fracture 10/14/2017   Chronic heart failure with preserved ejection fraction (HFpEF) (HCC)    Heart murmur    Hypertension    LBBB (left bundle branch block)    LVH (left ventricular hypertrophy)    a. 06/2016 Echo: EF 60-65%, no rwma, mild MR. PASP ; b. 01/2021 Echo: EF 55-60%, sev assym septal hypertrophy (LV basal-mid septum up to 1.5cm). No SAM. No rwma. GrIDD. Nl RV fxn. Mild Ao sclerosis.   Palpitations    a. 07/2016 Holter: Avg HR 77 (54-117). 59 isolated PVCs. Rare PACs (<1%) w/ 3 atrial runs - longest 7 beats, fastest 170 bpm x 4 beats - PAT.   PNA (pneumonia) 02/16/2018    PAST SURGICAL HISTORY:   Past Surgical History:  Procedure Laterality Date   APPENDECTOMY  1986   EYE SURGERY Bilateral 1995 and 1999   Lens implants   WRIST SURGERY Left 1986   ORIF     SOCIAL HISTORY:   Social History   Tobacco Use   Smoking status: Never   Smokeless tobacco: Never  Substance Use Topics   Alcohol use: No    Alcohol/week: 0.0 standard drinks of alcohol    FAMILY HISTORY:   Family History  Problem Relation Age of Onset   Cancer Mother 46  Breast Cancer   Heart disease Father 52       Congestive Heart Failure   COPD Father 9       Emphysema   Heart disease Brother 65       massive heart attack   Heart disease Maternal Grandmother    Heart disease Maternal Grandfather    Early death Paternal Grandmother        Early death unsure age   Heart disease Paternal Grandfather    Obesity Paternal Grandfather     DRUG ALLERGIES:   Allergies  Allergen Reactions   Augmentin [Amoxicillin-Pot Clavulanate] Nausea And Vomiting   Prednisone     REVIEW OF SYSTEMS:   ROS As per history of present illness. All pertinent systems were reviewed above. Constitutional, HEENT, cardiovascular, respiratory, GI, GU,  musculoskeletal, neuro, psychiatric, endocrine, integumentary and hematologic systems were reviewed and are otherwise negative/unremarkable except for positive findings mentioned above in the HPI.   MEDICATIONS AT HOME:   Prior to Admission medications   Medication Sig Start Date End Date Taking? Authorizing Provider  aspirin 81 MG EC tablet Take 81 mg by mouth daily. Swallow whole.   Yes [provider]  cholecalciferol (VITAMIN D3) 25 MCG (1000 UNIT) tablet Take 1,000 Units by mouth daily.   Yes [provider]  furosemide (LASIX) 20 MG tablet Take 1 tablet (20 mg total) by mouth daily. 11/12/21 11/12/22 Yes Glori Luis, MD  lisinopril (ZESTRIL) 10 MG tablet TAKE 1 TABLET BY MOUTH DAILY. 08/05/21  Yes Worthy Rancher B, FNP  Multiple Vitamins-Calcium (ONE-A-DAY WOMENS PO) Take 1 tablet by mouth daily.   Yes [provider]      VITAL SIGNS:  Blood pressure (!) 131/56, pulse 85, temperature 98.5 F (36.9 C), temperature source Oral, resp. rate 15, height 5' 5.5" (1.664 m), weight 65 kg, SpO2 92 %.  PHYSICAL EXAMINATION:  Physical Exam  GENERAL:  88 y.o.-year-old Caucasian female patient lying in the bed with no acute distress.  Roehrs: Pupils equal, round, reactive to light and accommodation. No scleral icterus. Extraocular muscles intact.  HEENT: Head atraumatic, normocephalic. Oropharynx and nasopharynx clear.  NECK:  Supple, no jugular venous distention. No thyroid enlargement, no tenderness.  LUNGS: Normal breath sounds bilaterally, no wheezing, rales,rhonchi or crepitation. No use of accessory muscles of respiration.  CARDIOVASCULAR: Regular rate and rhythm, S1, S2 normal. No murmurs, rubs, or gallops.  ABDOMEN: Soft, nondistended, nontender. Bowel sounds present. No organomegaly or mass.  EXTREMITIES: No pedal edema, cyanosis, or clubbing. Musculoskeletal: Left hip lateral tenderness with lateral rotation of the left hip. NEUROLOGIC: Cranial nerves II  through XII are intact. Muscle strength 5/5 in all extremities. Sensation intact. Gait not checked.  PSYCHIATRIC: The patient is alert and oriented x 3.  Normal affect and good eye contact. SKIN: No obvious rash, lesion, or ulcer.   LABORATORY PANEL:   CBC Recent Labs  Lab 02/01/22 2138  WBC 18.9*  HGB 12.5  HCT 41.7  PLT 140*   ------------------------------------------------------------------------------------------------------------------  Chemistries  Recent Labs  Lab 02/01/22 2138  NA 135  K 4.5  CL 103  CO2 22  GLUCOSE 247*  BUN 19  CREATININE 0.71  CALCIUM 9.2   ------------------------------------------------------------------------------------------------------------------  Cardiac Enzymes No results for input(s): "TROPONINI" in the last 168 hours. ------------------------------------------------------------------------------------------------------------------  RADIOLOGY:  DG Chest 1 View  Result Date: 02/01/2022 CLINICAL DATA:  Trauma, fall EXAM: CHEST  1 VIEW COMPARISON:  09/08/2021 FINDINGS: Transverse diameter of heart is increased. There are no  signs of pulmonary edema or focal pulmonary consolidation. There is no pleural effusion or pneumothorax. There is mild deformity in the posterolateral aspect of left fourth rib, possibly suggesting old fracture. Osteopenia is seen in bony structures. IMPRESSION: Cardiomegaly. There are no signs of pulmonary edema or focal pulmonary consolidation. Electronically Signed   By: Ernie Avena M.D.   On: 02/01/2022 19:16   DG Shoulder Left  Result Date: 02/01/2022 CLINICAL DATA:  Trauma, fall, pain EXAM: LEFT SHOULDER - 2+ VIEW COMPARISON:  None Available. FINDINGS: There is cortical irregularity along the medial margin of neck cough proximal left humerus. There is faint sclerosis in the neck of the left femur. Degenerative changes are noted in left AC joint. Osteopenia is seen in bony structures. Degenerative changes  noted in cervical spine. IMPRESSION: Deformity in the neck of the left humerus suggests recent impacted fracture. Less likely possibility would be residual change from previous injury. Electronically Signed   By: Ernie Avena M.D.   On: 02/01/2022 19:14   DG Hip Unilat W or Wo Pelvis 2-3 Views Left  Result Date: 02/01/2022 CLINICAL DATA:  Trauma, fall EXAM: DG HIP (WITH OR WITHOUT PELVIS) 2-3V LEFT COMPARISON:  None Available. FINDINGS: There is comminuted intertrochanteric fracture of proximal left femur. There is slight overriding of fracture fragments in the medial aspect. There is slight medial displacement of lesser trochanter. Osteopenia is seen in bony structures. Degenerative changes are noted in lower lumbar spine. There are soft tissue calcifications adjacent to the ischium. IMPRESSION: Comminuted intertrochanteric fracture is seen in the proximal left femur. Electronically Signed   By: Ernie Avena M.D.   On: 02/01/2022 19:12      IMPRESSION AND PLAN:  Assessment and Plan: * Closed left hip fracture (HCC) - The patient will be admitted to a surgical bed. - Pain management will be provided. - She will be kept n.p.o. after midnight. - We will stop her aspirin. - Orthopedic consultation will be obtained. - Dr. Martha Clan was notified about the patient. - The patient has no history of CVA, coronary artery disease, renal failure with creatinine more than 2 or diabetes mellitus on insulin.  She has a history of HFpEF without exacerbation and therefore has slightly higher than average risk for her age for perioperative cardiovascular events per the revised cardiac risk index.  She has no current pulmonary issues.  Essential hypertension - We will continue Zestril.  (HFpEF) heart failure with preserved ejection fraction (HCC) - We will continue her Lasix.   DVT prophylaxis: Lovenox.  Advanced Care Planning:  Code Status: full code.  Family Communication:  The plan of care  was discussed in details with the patient (and family). I answered all questions. The patient agreed to proceed with the above mentioned plan. Further management will depend upon hospital course. Disposition Plan: Back to previous home environment Consults called: Orthopedic consult.   All the records are reviewed and case discussed with ED provider.  Status is: Inpatient  At the time of the admission, it appears that the appropriate admission status for this patient is inpatient.  This is judged to be reasonable and necessary in order to provide the required intensity of service to ensure the patient's safety given the presenting symptoms, physical exam findings and initial radiographic and laboratory data in the context of comorbid conditions.  The patient requires inpatient status due to high intensity of service, high risk of further deterioration and high frequency of surveillance required.  I certify that at the  time of admission, it is my clinical judgment that the patient will require inpatient hospital care extending more than 2 midnights.                            Dispo: The patient is from: Home              Anticipated d/c is to: Home              Patient currently is not medically stable to d/c.              Difficult to place patient: No  Hannah Beat M.D on 02/02/2022 at 2:11 AM  [facility]   From 7 PM-7 AM, contact night-coverage [email or web address]  CC: Primary care physician; Glori Luis, MD

## 2022-02-01 NOTE — ED Triage Notes (Signed)
Pt lost balance and fell. Denies LOC or head injury. Pt c/o pain to left hip and left shoulder.

## 2022-02-01 NOTE — Consult Note (Signed)
Full consult to follow.    88 year old female with left proximal humerus and left intertrochanteric hip fractures after a fall at home.  Hip fracture will require surgical fixation.  Patient admitted to the hospitalist service for pre-op clearance.  NPO after midnight in preparation for surgery tomorrow.  Patient not on anticoagulation medication at home.

## 2022-02-01 NOTE — ED Triage Notes (Signed)
Patient arrived by EMS from home. C/o mechanical fall. Denies LOC. Denies blood thinners. C/o left hip pain

## 2022-02-02 ENCOUNTER — Other Ambulatory Visit: Payer: Self-pay

## 2022-02-02 ENCOUNTER — Encounter: Payer: Self-pay | Admitting: Family Medicine

## 2022-02-02 ENCOUNTER — Inpatient Hospital Stay: Payer: Medicare Other

## 2022-02-02 ENCOUNTER — Inpatient Hospital Stay: Payer: Medicare Other | Admitting: Anesthesiology

## 2022-02-02 ENCOUNTER — Encounter
Admission: EM | Disposition: A | Discharge status: Skilled Nursing Facility | Payer: Self-pay | Source: Home / Self Care | Attending: Family Medicine

## 2022-02-02 DIAGNOSIS — S42202A Unspecified fracture of upper end of left humerus, initial encounter for closed fracture: Secondary | ICD-10-CM | POA: Diagnosis not present

## 2022-02-02 DIAGNOSIS — I10 Essential (primary) hypertension: Secondary | ICD-10-CM | POA: Diagnosis present

## 2022-02-02 DIAGNOSIS — E119 Type 2 diabetes mellitus without complications: Secondary | ICD-10-CM

## 2022-02-02 DIAGNOSIS — S72142A Displaced intertrochanteric fracture of left femur, initial encounter for closed fracture: Secondary | ICD-10-CM

## 2022-02-02 HISTORY — PX: INTRAMEDULLARY (IM) NAIL INTERTROCHANTERIC: SHX5875

## 2022-02-02 LAB — GLUCOSE, CAPILLARY
Glucose-Capillary: 258 mg/dL — ABNORMAL HIGH (ref 70–99)
Glucose-Capillary: 224 mg/dL — ABNORMAL HIGH (ref 70–99)

## 2022-02-02 LAB — TYPE AND SCREEN
ABO/RH(D): O POS
Antibody Screen: NEGATIVE

## 2022-02-02 LAB — BASIC METABOLIC PANEL
Anion gap: 8 (ref 5–15)
BUN: 19 mg/dL (ref 8–23)
CO2: 26 mmol/L (ref 22–32)
Calcium: 9 mg/dL (ref 8.9–10.3)
Chloride: 104 mmol/L (ref 98–111)
Creatinine, Ser: 0.76 mg/dL (ref 0.44–1.00)
GFR, Estimated: >60 mL/min (ref >60)
Glucose, Bld: 188 mg/dL — ABNORMAL HIGH (ref 70–99)
Potassium: 4.2 mmol/L (ref 3.5–5.1)
Sodium: 138 mmol/L (ref 135–145)

## 2022-02-02 LAB — CBC
HCT: 44.4 % (ref 36.0–46.0)
Hemoglobin: 14 g/dL (ref 12.0–15.0)
MCH: 26.8 pg (ref 26.0–34.0)
MCHC: 31.5 g/dL (ref 30.0–36.0)
MCV: 84.9 fL (ref 80.0–100.0)
Platelets: 203 10*3/uL (ref 150–400)
RBC: 5.23 MIL/uL — ABNORMAL HIGH (ref 3.87–5.11)
RDW: 13.6 % (ref 11.5–15.5)
WBC: 15.7 10*3/uL — ABNORMAL HIGH (ref 4.0–10.5)
nRBC: 0 % (ref 0.0–0.2)

## 2022-02-02 SURGERY — FIXATION, FRACTURE, INTERTROCHANTERIC, WITH INTRAMEDULLARY ROD
Anesthesia: General | Site: Hip | Laterality: Left

## 2022-02-02 MED ORDER — LIDOCAINE HCL (PF) 2 % IJ SOLN
INTRAMUSCULAR | Status: AC
  Filled 2022-02-02: qty 5

## 2022-02-02 MED ORDER — ONDANSETRON HCL 4 MG/2ML IJ SOLN
INTRAMUSCULAR | Status: DC | PRN
  Administered 2022-02-02: 4 mg via INTRAVENOUS

## 2022-02-02 MED ORDER — ACETAMINOPHEN 500 MG PO TABS
1000.0000 mg | ORAL_TABLET | Freq: Four times a day (QID) | ORAL | Status: AC
  Administered 2022-02-02 – 2022-02-03 (×2): 1000 mg via ORAL
  Filled 2022-02-02 (×3): qty 2

## 2022-02-02 MED ORDER — 0.9 % SODIUM CHLORIDE (POUR BTL) OPTIME
TOPICAL | Status: DC | PRN
  Administered 2022-02-02: 1000 mL

## 2022-02-02 MED ORDER — PHENYLEPHRINE HCL-NACL 20-0.9 MG/250ML-% IV SOLN
INTRAVENOUS | Status: AC
  Filled 2022-02-02: qty 250

## 2022-02-02 MED ORDER — PHENOL 1.4 % MT LIQD: 1.0000 | OROMUCOSAL | Status: DC | PRN

## 2022-02-02 MED ORDER — OXYCODONE HCL 5 MG PO TABS: 5.0000 mg | ORAL_TABLET | ORAL | Status: DC | PRN

## 2022-02-02 MED ORDER — PROPOFOL 10 MG/ML IV BOLUS
INTRAVENOUS | Status: DC | PRN
  Administered 2022-02-02: 100 mg via INTRAVENOUS

## 2022-02-02 MED ORDER — CEFAZOLIN SODIUM-DEXTROSE 2-4 GM/100ML-% IV SOLN
INTRAVENOUS | Status: AC
  Administered 2022-02-02: 2 g via INTRAVENOUS
  Filled 2022-02-02: qty 100

## 2022-02-02 MED ORDER — LACTATED RINGERS IV SOLN: INTRAVENOUS | Status: DC | PRN

## 2022-02-02 MED ORDER — ESMOLOL HCL 100 MG/10ML IV SOLN
INTRAVENOUS | Status: DC | PRN
  Administered 2022-02-02: 15 mg via INTRAVENOUS

## 2022-02-02 MED ORDER — FENTANYL CITRATE (PF) 100 MCG/2ML IJ SOLN
INTRAMUSCULAR | Status: AC
  Filled 2022-02-02: qty 2

## 2022-02-02 MED ORDER — INSULIN ASPART 100 UNIT/ML IJ SOLN
0.0000 [IU] | Freq: Every day | INTRAMUSCULAR | Status: DC
  Administered 2022-02-02 – 2022-02-06 (×2): 2 [IU] via SUBCUTANEOUS
  Filled 2022-02-02 (×2): qty 1

## 2022-02-02 MED ORDER — SUGAMMADEX SODIUM 200 MG/2ML IV SOLN
INTRAVENOUS | Status: DC | PRN
  Administered 2022-02-02: 100 mg via INTRAVENOUS

## 2022-02-02 MED ORDER — LIDOCAINE HCL (CARDIAC) PF 100 MG/5ML IV SOSY
PREFILLED_SYRINGE | INTRAVENOUS | Status: DC | PRN
  Administered 2022-02-02: 100 mg via INTRAVENOUS

## 2022-02-02 MED ORDER — EPHEDRINE SULFATE (PRESSORS) 50 MG/ML IJ SOLN
INTRAMUSCULAR | Status: DC | PRN
  Administered 2022-02-02: 10 mg via INTRAVENOUS
  Administered 2022-02-02 (×2): 5 mg via INTRAVENOUS

## 2022-02-02 MED ORDER — ACETAMINOPHEN 10 MG/ML IV SOLN
INTRAVENOUS | Status: DC | PRN
  Administered 2022-02-02: 1000 mg via INTRAVENOUS

## 2022-02-02 MED ORDER — DEXAMETHASONE SODIUM PHOSPHATE 10 MG/ML IJ SOLN
INTRAMUSCULAR | Status: DC | PRN
  Administered 2022-02-02: 4 mg via INTRAVENOUS

## 2022-02-02 MED ORDER — DEXAMETHASONE SODIUM PHOSPHATE 10 MG/ML IJ SOLN
INTRAMUSCULAR | Status: AC
Start: 2022-02-02 — End: ?
  Filled 2022-02-02: qty 1

## 2022-02-02 MED ORDER — DOCUSATE SODIUM 100 MG PO CAPS
100.0000 mg | ORAL_CAPSULE | Freq: Two times a day (BID) | ORAL | Status: DC
  Administered 2022-02-02 – 2022-02-08 (×6): 100 mg via ORAL
  Filled 2022-02-02 (×11): qty 1

## 2022-02-02 MED ORDER — TRANEXAMIC ACID-NACL 1000-0.7 MG/100ML-% IV SOLN
INTRAVENOUS | Status: AC
  Administered 2022-02-02: 1000 mg via INTRAVENOUS
  Filled 2022-02-02: qty 100

## 2022-02-02 MED ORDER — KETAMINE HCL 10 MG/ML IJ SOLN
INTRAMUSCULAR | Status: DC | PRN
  Administered 2022-02-02: 15 mg via INTRAVENOUS
  Administered 2022-02-02: 10 mg via INTRAVENOUS
  Administered 2022-02-02: 15 mg via INTRAVENOUS

## 2022-02-02 MED ORDER — ASPIRIN 81 MG PO TBEC
81.0000 mg | DELAYED_RELEASE_TABLET | Freq: Every day | ORAL | Status: DC
  Administered 2022-02-02 – 2022-02-04 (×3): 81 mg via ORAL
  Filled 2022-02-02 (×3): qty 1

## 2022-02-02 MED ORDER — ALUM & MAG HYDROXIDE-SIMETH 200-200-20 MG/5ML PO SUSP
30.0000 mL | ORAL | Status: DC | PRN
  Administered 2022-02-03 – 2022-02-07 (×2): 30 mL via ORAL
  Filled 2022-02-02 (×2): qty 30

## 2022-02-02 MED ORDER — POTASSIUM CHLORIDE IN NACL 20-0.45 MEQ/L-% IV SOLN
INTRAVENOUS | Status: DC
  Filled 2022-02-02: qty 1000

## 2022-02-02 MED ORDER — MENTHOL 3 MG MT LOZG
1.0000 | LOZENGE | OROMUCOSAL | Status: DC | PRN
Start: 2022-02-02 — End: 2022-02-08

## 2022-02-02 MED ORDER — TRANEXAMIC ACID-NACL 1000-0.7 MG/100ML-% IV SOLN: 1000.0000 mg | Freq: Once | INTRAVENOUS | Status: AC

## 2022-02-02 MED ORDER — OXYCODONE HCL 5 MG PO TABS: 5.0000 mg | ORAL_TABLET | Freq: Once | ORAL | Status: DC | PRN

## 2022-02-02 MED ORDER — KETAMINE HCL 50 MG/5ML IJ SOSY
PREFILLED_SYRINGE | INTRAMUSCULAR | Status: AC
  Filled 2022-02-02: qty 5

## 2022-02-02 MED ORDER — ACETAMINOPHEN 10 MG/ML IV SOLN
INTRAVENOUS | Status: AC
Start: 2022-02-02 — End: ?
  Filled 2022-02-02: qty 100

## 2022-02-02 MED ORDER — ONDANSETRON HCL 4 MG/2ML IJ SOLN
4.0000 mg | Freq: Four times a day (QID) | INTRAMUSCULAR | Status: DC | PRN
  Administered 2022-02-04: 4 mg via INTRAVENOUS
  Filled 2022-02-02: qty 2

## 2022-02-02 MED ORDER — PHENYLEPHRINE HCL (PRESSORS) 10 MG/ML IV SOLN
INTRAVENOUS | Status: DC | PRN
  Administered 2022-02-02: 80 ug via INTRAVENOUS
  Administered 2022-02-02 (×2): 40 ug via INTRAVENOUS

## 2022-02-02 MED ORDER — LISINOPRIL 10 MG PO TABS
10.0000 mg | ORAL_TABLET | Freq: Every day | ORAL | Status: DC
  Administered 2022-02-03: 10 mg via ORAL
  Filled 2022-02-02: qty 1

## 2022-02-02 MED ORDER — KETOROLAC TROMETHAMINE 30 MG/ML IJ SOLN
INTRAMUSCULAR | Status: AC
  Filled 2022-02-02: qty 1

## 2022-02-02 MED ORDER — PROPOFOL 10 MG/ML IV BOLUS
INTRAVENOUS | Status: AC
  Filled 2022-02-02: qty 20

## 2022-02-02 MED ORDER — ONDANSETRON HCL 4 MG/2ML IJ SOLN
INTRAMUSCULAR | Status: AC
Start: 2022-02-02 — End: ?
  Filled 2022-02-02: qty 2

## 2022-02-02 MED ORDER — TRAMADOL HCL 50 MG PO TABS
50.0000 mg | ORAL_TABLET | Freq: Four times a day (QID) | ORAL | Status: DC
  Administered 2022-02-02 – 2022-02-03 (×3): 50 mg via ORAL
  Filled 2022-02-02 (×7): qty 1

## 2022-02-02 MED ORDER — POLYETHYLENE GLYCOL 3350 17 G PO PACK: 17.0000 g | PACK | Freq: Every day | ORAL | Status: DC | PRN

## 2022-02-02 MED ORDER — BUPIVACAINE HCL (PF) 0.5 % IJ SOLN
INTRAMUSCULAR | Status: AC
  Filled 2022-02-02: qty 10

## 2022-02-02 MED ORDER — FENTANYL CITRATE (PF) 100 MCG/2ML IJ SOLN
INTRAMUSCULAR | Status: DC | PRN
Start: 2022-02-02 — End: 2022-02-02
  Administered 2022-02-02: 15 ug via INTRAVENOUS
  Administered 2022-02-02: 35 ug via INTRAVENOUS
  Administered 2022-02-02: 25 ug via INTRAVENOUS

## 2022-02-02 MED ORDER — INSULIN ASPART 100 UNIT/ML IJ SOLN
0.0000 [IU] | Freq: Three times a day (TID) | INTRAMUSCULAR | Status: DC
  Administered 2022-02-02: 5 [IU] via SUBCUTANEOUS
  Administered 2022-02-03 (×2): 2 [IU] via SUBCUTANEOUS
  Administered 2022-02-03: 1 [IU] via SUBCUTANEOUS
  Administered 2022-02-04: 3 [IU] via SUBCUTANEOUS
  Administered 2022-02-04: 2 [IU] via SUBCUTANEOUS
  Administered 2022-02-04: 3 [IU] via SUBCUTANEOUS
  Administered 2022-02-05: 5 [IU] via SUBCUTANEOUS
  Administered 2022-02-05 (×2): 3 [IU] via SUBCUTANEOUS
  Administered 2022-02-06 (×3): 2 [IU] via SUBCUTANEOUS
  Administered 2022-02-07: 3 [IU] via SUBCUTANEOUS
  Administered 2022-02-07 (×2): 2 [IU] via SUBCUTANEOUS
  Administered 2022-02-08: 1 [IU] via SUBCUTANEOUS
  Administered 2022-02-08: 5 [IU] via SUBCUTANEOUS
  Filled 2022-02-02 (×17): qty 1

## 2022-02-02 MED ORDER — ROCURONIUM BROMIDE 100 MG/10ML IV SOLN
INTRAVENOUS | Status: DC | PRN
  Administered 2022-02-02: 5 mg via INTRAVENOUS
  Administered 2022-02-02: 35 mg via INTRAVENOUS

## 2022-02-02 MED ORDER — METHOCARBAMOL 500 MG PO TABS: 500.0000 mg | ORAL_TABLET | Freq: Four times a day (QID) | ORAL | Status: DC | PRN

## 2022-02-02 MED ORDER — FENTANYL CITRATE (PF) 100 MCG/2ML IJ SOLN: 25.0000 ug | INTRAMUSCULAR | Status: DC | PRN

## 2022-02-02 MED ORDER — ENOXAPARIN SODIUM 40 MG/0.4ML IJ SOSY
40.0000 mg | PREFILLED_SYRINGE | INTRAMUSCULAR | Status: DC
  Administered 2022-02-03: 40 mg via SUBCUTANEOUS
  Filled 2022-02-02: qty 0.4

## 2022-02-02 MED ORDER — BISACODYL 10 MG RE SUPP: 10.0000 mg | Freq: Every day | RECTAL | Status: DC | PRN

## 2022-02-02 MED ORDER — METHOCARBAMOL 1000 MG/10ML IJ SOLN: 500.0000 mg | Freq: Four times a day (QID) | INTRAVENOUS | Status: DC | PRN

## 2022-02-02 MED ORDER — CEFAZOLIN SODIUM-DEXTROSE 2-4 GM/100ML-% IV SOLN
2.0000 g | Freq: Four times a day (QID) | INTRAVENOUS | Status: AC
  Administered 2022-02-02: 2 g via INTRAVENOUS
  Filled 2022-02-02 (×2): qty 100

## 2022-02-02 MED ORDER — MIDAZOLAM HCL 2 MG/2ML IJ SOLN
INTRAMUSCULAR | Status: AC
  Filled 2022-02-02: qty 2

## 2022-02-02 MED ORDER — DEXMEDETOMIDINE (PRECEDEX) IN NS 20 MCG/5ML (4 MCG/ML) IV SYRINGE
PREFILLED_SYRINGE | INTRAVENOUS | Status: DC | PRN
  Administered 2022-02-02 (×2): 4 ug via INTRAVENOUS

## 2022-02-02 MED ORDER — SUCCINYLCHOLINE CHLORIDE 200 MG/10ML IV SOSY
PREFILLED_SYRINGE | INTRAVENOUS | Status: DC | PRN
  Administered 2022-02-02: 140 mg via INTRAVENOUS

## 2022-02-02 MED ORDER — SENNA 8.6 MG PO TABS
1.0000 | ORAL_TABLET | Freq: Two times a day (BID) | ORAL | Status: DC
  Administered 2022-02-03 – 2022-02-04 (×3): 8.6 mg via ORAL
  Filled 2022-02-02 (×7): qty 1

## 2022-02-02 MED ORDER — OXYCODONE HCL 5 MG/5ML PO SOLN: 5.0000 mg | Freq: Once | ORAL | Status: DC | PRN

## 2022-02-02 MED ORDER — ONDANSETRON HCL 4 MG PO TABS
4.0000 mg | ORAL_TABLET | Freq: Four times a day (QID) | ORAL | Status: DC | PRN
  Administered 2022-02-03: 4 mg via ORAL
  Filled 2022-02-02: qty 1

## 2022-02-02 MED ORDER — ESMOLOL HCL 100 MG/10ML IV SOLN
INTRAVENOUS | Status: AC
  Filled 2022-02-02: qty 10

## 2022-02-02 SURGICAL SUPPLY — 43 items
BIT DRILL CANN LG 4.3MM (BIT) IMPLANT
BLADE SURG 10 STRL SS (BLADE) ×1 IMPLANT
BNDG COHESIVE 6X5 TAN ST LF (GAUZE/BANDAGES/DRESSINGS) ×4 IMPLANT
COVER LIGHT HANDLE STERIS (MISCELLANEOUS) ×1 IMPLANT
DRAPE 3/4 80X56 (DRAPES) ×4 IMPLANT
DRAPE SURG 17X11 SM STRL (DRAPES) ×4 IMPLANT
DRAPE U-SHAPE 47X51 STRL (DRAPES) ×4 IMPLANT
DRILL BIT CANN LG 4.3MM (BIT) ×2
DRSG OPSITE POSTOP 3X4 (GAUZE/BANDAGES/DRESSINGS) ×4 IMPLANT
DRSG OPSITE POSTOP 4X14 (GAUZE/BANDAGES/DRESSINGS) IMPLANT
DRSG OPSITE POSTOP 4X6 (GAUZE/BANDAGES/DRESSINGS) ×3 IMPLANT
DURAPREP 26ML APPLICATOR (WOUND CARE) ×4 IMPLANT
ELECT REM PT RETURN 9FT ADLT (ELECTROSURGICAL) ×2
ELECTRODE REM PT RTRN 9FT ADLT (ELECTROSURGICAL) ×1 IMPLANT
GLOVE BIOGEL PI IND STRL 9 (GLOVE) ×1 IMPLANT
GLOVE BIOGEL PI INDICATOR 9 (GLOVE) ×2
GLOVE BIOGEL PI ORTHO SZ9 (GLOVE) ×8 IMPLANT
GOWN STRL REUS TWL 2XL XL LVL4 (GOWN DISPOSABLE) ×2 IMPLANT
GOWN STRL REUS W/ TWL LRG LVL3 (GOWN DISPOSABLE) ×1 IMPLANT
GOWN STRL REUS W/TWL LRG LVL3 (GOWN DISPOSABLE) ×2
GUIDEPIN VERSANAIL DSP 3.2X444 (ORTHOPEDIC DISPOSABLE SUPPLIES) ×1 IMPLANT
HEMOVAC 400CC 10FR (MISCELLANEOUS) IMPLANT
HIP FRAC NAIL LAG SCR 10.5X100 (Orthopedic Implant) ×2 IMPLANT
KIT TURNOVER CYSTO (KITS) ×2 IMPLANT
MANIFOLD NEPTUNE II (INSTRUMENTS) ×2 IMPLANT
MAT ABSORB  FLUID 56X50 GRAY (MISCELLANEOUS) ×2
MAT ABSORB FLUID 56X50 GRAY (MISCELLANEOUS) ×1 IMPLANT
NAIL HIP FRACT 130D 11X180 (Screw) ×1 IMPLANT
NS IRRIG 500ML POUR BTL (IV SOLUTION) ×2 IMPLANT
PACK HIP COMPR (MISCELLANEOUS) ×2 IMPLANT
PAD ARMBOARD 7.5X6 YLW CONV (MISCELLANEOUS) ×2 IMPLANT
SCREW BONE CORTICAL 5.0X38 (Screw) ×1 IMPLANT
SCREW CANN THRD AFF 10.5X100 (Orthopedic Implant) IMPLANT
STAPLER SKIN PROX 35W (STAPLE) ×2 IMPLANT
SUCTION FRAZIER HANDLE 10FR (MISCELLANEOUS) ×2
SUCTION TUBE FRAZIER 10FR DISP (MISCELLANEOUS) ×1 IMPLANT
SUT VIC AB 0 CT1 36 (SUTURE) ×4 IMPLANT
SUT VIC AB 2-0 CT1 27 (SUTURE) ×2
SUT VIC AB 2-0 CT1 TAPERPNT 27 (SUTURE) ×1 IMPLANT
SUT VICRYL 0 AB UR-6 (SUTURE) ×2 IMPLANT
SYR 30ML LL (SYRINGE) ×2 IMPLANT
TRAP FLUID SMOKE EVACUATOR (MISCELLANEOUS) ×2 IMPLANT
WATER STERILE IRR 500ML POUR (IV SOLUTION) ×2 IMPLANT

## 2022-02-02 NOTE — ED Notes (Signed)
Patient began to desat while she was sleeping. O2 rate came back up to the mid-90's when she awoke. Placed patient on 2L nasal cannula while she sleeps

## 2022-02-02 NOTE — Anesthesia Postprocedure Evaluation (Signed)
Anesthesia Post Note  Patient: Laurie Patel  Procedure(s) Performed: INTRAMEDULLARY (IM) NAIL INTERTROCHANTERIC (Left: Hip)  Patient location during evaluation: PACU Anesthesia Type: Combined General/Spinal Level of consciousness: awake and alert Pain management: pain level controlled Vital Signs Assessment: post-procedure vital signs reviewed and stable Respiratory status: spontaneous breathing, nonlabored ventilation, respiratory function stable and patient connected to nasal cannula oxygen Cardiovascular status: blood pressure returned to baseline and stable Postop Assessment: no apparent nausea or vomiting Anesthetic complications: no   No notable events documented.   Last Vitals:  Vitals:   02/02/22 1430 02/02/22 1704  BP: (!) 128/47 (!) 147/54  Pulse: 85 91  Resp: 16 16  Temp: 36.5 C 36.6 C  SpO2: 91% 92%    Last Pain:  Vitals:   02/02/22 1345  TempSrc:   PainSc: 0-No pain                 Stephanie Coup

## 2022-02-02 NOTE — Assessment & Plan Note (Addendum)
The patient's blood pressures are a little high on lisinopril, metoprolol and lasix. Dose of metoprolol has been increased.

## 2022-02-02 NOTE — Consult Note (Signed)
ORTHOPAEDIC CONSULTATION  REQUESTING PHYSICIAN: Alberteen Sam, *  Chief Complaint: Left intertrochanteric hip fracture in the proximal humerus fracture  HPI: Laurie Patel is a 88 y.o. female was admitted overnight after mechanical fall at home.  She tripped coming in from her porch while holding plants in her hand.  She landed on her left side.  Patient was brought to the [facility] emergency department where x-rays revealed an impacted fracture of the left proximal humerus and a mildly displaced left intertrochanteric hip fracture.  Patient is seen in her hospital room this morning with her daughter at the bedside.  Patient denies any significant left shoulder or hip pain while at rest.  She has pain with movement of either limb.  She denies numbness or tingling.  She denies other areas of pain and denies loss of consciousness during her fall.  Past Medical History:  Diagnosis Date   Ankle fracture 10/14/2017   Chronic heart failure with preserved ejection fraction (HFpEF) (HCC)    Heart murmur    Hypertension    LBBB (left bundle branch block)    LVH (left ventricular hypertrophy)    a. 06/2016 Echo: EF 60-65%, no rwma, mild MR. PASP ; b. 01/2021 Echo: EF 55-60%, sev assym septal hypertrophy (LV basal-mid septum up to 1.5cm). No SAM. No rwma. GrIDD. Nl RV fxn. Mild Ao sclerosis.   Palpitations    a. 07/2016 Holter: Avg HR 77 (54-117). 59 isolated PVCs. Rare PACs (<1%) w/ 3 atrial runs - longest 7 beats, fastest 170 bpm x 4 beats - PAT.   PNA (pneumonia) 02/16/2018   Past Surgical History:  Procedure Laterality Date   APPENDECTOMY  1986   EYE SURGERY Bilateral 1995 and 1999   Lens implants   WRIST SURGERY Left 1986   ORIF    Social History   Socioeconomic History   Marital status: Widowed    Spouse name: Not on file   Number of children: 5   Years of education: 1 year of college.   Highest education level: 12th grade  Occupational History   Not on file   Tobacco Use   Smoking status: Never   Smokeless tobacco: Never  Vaping Use   Vaping Use: Never used  Substance and Sexual Activity   Alcohol use: No    Alcohol/week: 0.0 standard drinks of alcohol   Drug use: No   Sexual activity: Not Currently  Other Topics Concern   Not on file  Social History Narrative   Lives in [place]    Owns a [employer]    1 dog   Social Determinants of Health   Financial Resource Strain: Low Risk  (07/11/2021)   Overall Financial Resource Strain (CARDIA)    Difficulty of Paying Living Expenses: Not hard at all  Food Insecurity: No Food Insecurity (07/11/2021)   Hunger Vital Sign    Worried About Running Out of Food in the Last Year: Never true    Ran Out of Food in the Last Year: Never true  Transportation Needs: No Transportation Needs (07/11/2021)   PRAPARE - Administrator, Civil Service (Medical): No    Lack of Transportation (Non-Medical): No  Physical Activity: Unknown (07/02/2018)   Exercise Vital Sign    Days of Exercise per Week: 0 days    Minutes of Exercise per Session: Not on file  Stress: No Stress Concern Present (07/11/2021)   [employer] of Occupational Health - Occupational Stress Questionnaire    Feeling  of Stress : Not at all  Social Connections: Unknown (07/11/2021)   Social Connection and Isolation Panel [NHANES]    Frequency of Communication with Friends and Family: More than three times a week    Frequency of Social Gatherings with Friends and Family: More than three times a week    Attends Religious Services: Not on file    Active Member of Clubs or Organizations: Not on file    Attends Banker Meetings: Not on file    Marital Status: Widowed   Family History  Problem Relation Age of Onset   Cancer Mother 71       Breast Cancer   Heart disease Father 74       Congestive Heart Failure   COPD Father 56       Emphysema   Heart disease Brother 1       massive heart attack   Heart  disease Maternal Grandmother    Heart disease Maternal Grandfather    Early death Paternal Grandmother        Early death unsure age   Heart disease Paternal Grandfather    Obesity Paternal Grandfather    Allergies  Allergen Reactions   Augmentin [Amoxicillin-Pot Clavulanate] Nausea And Vomiting   Prednisone    Prior to Admission medications   Medication Sig Start Date End Date Taking? Authorizing Provider  aspirin 81 MG EC tablet Take 81 mg by mouth daily. Swallow whole.   Yes [provider]  cholecalciferol (VITAMIN D3) 25 MCG (1000 UNIT) tablet Take 1,000 Units by mouth daily.   Yes [provider]  furosemide (LASIX) 20 MG tablet Take 1 tablet (20 mg total) by mouth daily. 11/12/21 11/12/22 Yes Glori Luis, MD  lisinopril (ZESTRIL) 10 MG tablet TAKE 1 TABLET BY MOUTH DAILY. 08/05/21  Yes Worthy Rancher B, FNP  Multiple Vitamins-Calcium (ONE-A-DAY WOMENS PO) Take 1 tablet by mouth daily.   Yes [provider]   DG Chest 1 View  Result Date: 02/01/2022 CLINICAL DATA:  Trauma, fall EXAM: CHEST  1 VIEW COMPARISON:  09/08/2021 FINDINGS: Transverse diameter of heart is increased. There are no signs of pulmonary edema or focal pulmonary consolidation. There is no pleural effusion or pneumothorax. There is mild deformity in the posterolateral aspect of left fourth rib, possibly suggesting old fracture. Osteopenia is seen in bony structures. IMPRESSION: Cardiomegaly. There are no signs of pulmonary edema or focal pulmonary consolidation. Electronically Signed   By: Ernie Avena M.D.   On: 02/01/2022 19:16   DG Shoulder Left  Result Date: 02/01/2022 CLINICAL DATA:  Trauma, fall, pain EXAM: LEFT SHOULDER - 2+ VIEW COMPARISON:  None Available. FINDINGS: There is cortical irregularity along the medial margin of neck cough proximal left humerus. There is faint sclerosis in the neck of the left femur. Degenerative changes are noted in left AC joint. Osteopenia is  seen in bony structures. Degenerative changes noted in cervical spine. IMPRESSION: Deformity in the neck of the left humerus suggests recent impacted fracture. Less likely possibility would be residual change from previous injury. Electronically Signed   By: Ernie Avena M.D.   On: 02/01/2022 19:14   DG Hip Unilat W or Wo Pelvis 2-3 Views Left  Result Date: 02/01/2022 CLINICAL DATA:  Trauma, fall EXAM: DG HIP (WITH OR WITHOUT PELVIS) 2-3V LEFT COMPARISON:  None Available. FINDINGS: There is comminuted intertrochanteric fracture of proximal left femur. There is slight overriding of fracture fragments in the medial aspect. There is slight medial  displacement of lesser trochanter. Osteopenia is seen in bony structures. Degenerative changes are noted in lower lumbar spine. There are soft tissue calcifications adjacent to the ischium. IMPRESSION: Comminuted intertrochanteric fracture is seen in the proximal left femur. Electronically Signed   By: Ernie Avena M.D.   On: 02/01/2022 19:12    Positive ROS: All other systems have been reviewed and were otherwise negative with the exception of those mentioned in the HPI and as above.  Physical Exam: General: Alert, no acute distress  MUSCULOSKELETAL: Left shoulder: Patient skin is intact.  Patient is neurovascular intact.  Compartments are soft and compressible.  Patient has full digital range of motion, intact sensation light touch in her digits well-perfused.  Left hip/lower extremity: Patient has external rotation and shortening deformity of the left lower extremity.  Patient skin is intact overlying the left hip.  There is no erythema ecchymosis or swelling.  Compartments are soft and compressible.  Patient has intact sensation to light touch, palpable pedal pulses and intact motor function distally.  Assessment: Displaced left intertrochanteric hip fracture Packed and left proximal humerus fracture  Plan: I explained to the patient and  her daughter the nature of both her fractures.  The impacted left proximal humerus fracture will not require surgical intervention but she will continue sling immobilization.  Her left intertrochanteric hip fracture is mildly displaced with shortening.  I recommended intramedullary fixation for this fracture.  I explained the details of the operation as well as the postoperative course.  I discussed the risks and benefits of surgery patient and her daughter. The risks include but are not limited to infection, bleeding requiring blood transfusion, nerve or blood vessel injury, joint stiffness or loss of motion, persistent pain, weakness or instability, malunion, nonunion and hardware failure and the need for further surgery. Medical risks include but are not limited to DVT and pulmonary embolism, myocardial infarction, stroke, pneumonia, respiratory failure and death. Patient and her daughter understood these risks and were in agreement with the plan for surgical fixation of her left hip fracture.  Answered all the questions today.  Patient has been cleared medically by the hospital service for surgery.  I reviewed the patient's labs and radiographic studies in preparation for this case.     Juanell Fairly, MD    02/02/2022 10:06 AM

## 2022-02-02 NOTE — Hospital Course (Addendum)
Laurie Patel is a 88 y.o. F with HTN, DM, dCHF, and hx LBBB who presented with a mechanical fall and left hip pain.  In the ER, radiograph showed left hip fracture.  Ortho consulted.

## 2022-02-02 NOTE — Assessment & Plan Note (Addendum)
Appears euvolemic.  Continue Lasix. 

## 2022-02-02 NOTE — Assessment & Plan Note (Addendum)
S/p Intramedullary fixation for left intertrochanteric hip fracturby Dr. Martha Clan.   - Vitamin D 52  - PT eval completed. Recommendation is for SNF. The patient is to be weight bearing as tolerated on the left lower extremity. She is to follow up with orthopedic surgery in 10-14 days.

## 2022-02-02 NOTE — Transfer of Care (Signed)
Immediate Anesthesia Transfer of Care Note  Patient: Laurie Patel  Procedure(s) Performed: INTRAMEDULLARY (IM) NAIL INTERTROCHANTERIC (Left: Hip)  Patient Location: PACU  Anesthesia Type:General  Level of Consciousness: awake, alert  and oriented  Airway & Oxygen Therapy: Patient Spontanous Breathing and Patient connected to face mask oxygen  Post-op Assessment: Report given to RN and Post -op Vital signs reviewed and stable  Post vital signs: Reviewed and stable  Last Vitals:  Vitals Value Taken Time  BP 156/59 02/02/22 1315  Temp 36.2 C 02/02/22 1245  Pulse 90 02/02/22 1322  Resp 17 02/02/22 1322  SpO2 97 % 02/02/22 1322  Vitals shown include unvalidated device data.  Last Pain:  Vitals:   02/02/22 1300  TempSrc:   PainSc: 0-No pain         Complications: No notable events documented.

## 2022-02-02 NOTE — TOC Initial Note (Signed)
Transition of Care [facility]) - Initial/Assessment Note    Patient Details  Name: KRISSI WILLAIMS MRN: [id number] Date of Birth: 01-Jan-1928  [facility] CM/SW Contact:    Carmina Miller, LCSWA Phone Number: 02/02/2022, 8:55 AM  Clinical Narrative:                  CSW reached out to pt's daughter to discuss SNF vs HH, no answer, vm left.        Patient Goals and CMS Choice        Expected Discharge Plan and Services                                                Prior Living Arrangements/Services                       Activities of Daily Living      Permission Sought/Granted                  Emotional Assessment              Admission diagnosis:  Closed left hip fracture (HCC) [S72.002A] Closed nondisplaced intertrochanteric fracture of left femur, initial encounter (HCC) [S72.145A] Other closed nondisplaced fracture of proximal end of left humerus, initial encounter [S42.295A] Patient Active Problem List   Diagnosis Date Noted   Essential hypertension 02/02/2022   Closed fracture of left proximal humerus 02/02/2022   Closed intertrochanteric fracture of hip, left, initial encounter (HCC) 02/01/2022   Chronic diastolic CHF (congestive heart failure) (HCC) 01/15/2022   History of tick bite of right lower leg 01/15/2022   Constipation 04/18/2020   Elevated hemoglobin (HCC) 04/18/2020   Bilateral hand numbness 03/14/2020   Skin lesions 11/09/2019   Ear discomfort, right 05/12/2018   Dermatitis, eczematoid 02/16/2018   H/O: osteoarthritis 02/16/2018   Irregular cardiac rhythm 02/16/2018   Low back pain 02/16/2018   Nonspecific abnormal finding in stool contents 02/16/2018   Balance problem 10/14/2017   Palpitations 06/21/2016   Hyperlipidemia 03/21/2016   Allergic rhinitis 03/21/2016   Controlled type 2 diabetes mellitus without complication, without long-term current use of insulin (HCC) 09/14/2015   HTN  (hypertension) 08/27/2014   Breathlessness on exertion 10/05/2009   OP (osteoporosis) 09/04/2009   PCP:  Glori Luis, MD Pharmacy:   [id number] [place], [place] - [address] [place] [address] Phone: [phone] Fax: [phone]     Social Determinants of Health (SDOH) Interventions    Readmission Risk Interventions     No data to display

## 2022-02-02 NOTE — Assessment & Plan Note (Addendum)
-   Nonweightbearing to left upper extremity - Maintain left arm in sling -Outpatient orthopedics follow-up in 10-14 days

## 2022-02-02 NOTE — Progress Notes (Signed)
  Progress Note   Patient: Laurie Patel ZOX:[id number] DOB: Feb 18, 1928 DOA: 02/01/2022     1 DOS: the patient was seen and examined on 02/02/2022       Brief hospital course: Laurie Patel is a 88 y.o. F with HTN, DM, dCHF, and hx LBBB who presented with a mechanical fall and left hip pain.  In the ER, radiograph showed left hip fracture.  Ortho consulted.     Assessment and Plan: * Closed intertrochanteric fracture of hip, left, initial encounter (HCC) S/p Intramedullary fixation for left intertrochanteric hip fracturby Dr. Martha Clan - Lovenox for dvt ppx - I do not see furhter orders for ppx or weight bearing status from Orthopedics  - PT eval - Check vitamin D  Essential hypertension - Hold lisinopril perioperatively, restart tomorrow  Chronic diastolic CHF (congestive heart failure) (HCC) Appears euvolemic - Continue Lasix  Closed fracture of left proximal humerus -  Nonweightbearing to left upper extremity - Maintain left arm in sling  Hyperlipidemia Not on a statin, presumably due to age  Controlled type 2 diabetes mellitus without complication, without long-term current use of insulin (HCC) This is diet controlled.  Last hemoglobin A1c was 7.9%. - Sign scale corrections in the hospital          Subjective: Patient has no complaints.  No fever, no confusion, no chest pain, no dyspnea     Physical Exam: Vitals:   02/02/22 1300 02/02/22 1330 02/02/22 1345 02/02/22 1430  BP: (!) 153/65 (!) 151/58 127/60 (!) 128/47  Pulse: 91 88 86 85  Resp: 14 16 15 16   Temp:    97.7 F (36.5 C)  TempSrc:      SpO2: 97% 99% 99% 91%  Weight:      Height:       Elderly adult female, lying in bed, no acute distress RRR, she has a soft systolic murmur, no rales, no wheezes, respiratory rate normal Attention normal, affect normal, judgment and insight appear normal She guards the left arm, right arm strength appears normal, face symmetric, speech fluent  Data  Reviewed: Complete blood count shows elevated white blood cell count, platelets 140 K Chest x-ray shows cardiomegaly Glucose elevated Radiograph shows left femur fracture and a left distal humerus fracture  Family Communication: Daughter at the bedside    Disposition: Status is: Inpatient         Author: , MD 02/02/2022 3:05 PM  For on call review [email or web address].

## 2022-02-02 NOTE — Anesthesia Procedure Notes (Signed)
Procedure Name: Intubation Date/Time: 02/02/2022 10:52 AM  Performed by: Estanislado Emms, CRNAPre-anesthesia Checklist: Patient identified, Patient being monitored, Timeout performed, Emergency Drugs available and Suction available Patient Re-evaluated:Patient Re-evaluated prior to induction Oxygen Delivery Method: Circle system utilized Preoxygenation: Pre-oxygenation with 100% oxygen Induction Type: IV induction Ventilation: Mask ventilation without difficulty Laryngoscope Size: 3 and McGraph Grade View: Grade I Tube type: Oral Tube size: 7.0 mm Number of attempts: 1 Airway Equipment and Method: Stylet Placement Confirmation: ETT inserted through vocal cords under direct vision, positive ETCO2 and breath sounds checked- equal and bilateral Secured at: 21 cm Tube secured with: Tape Dental Injury: Teeth and Oropharynx as per pre-operative assessment

## 2022-02-02 NOTE — Assessment & Plan Note (Addendum)
Not on a statin, presumably due to age

## 2022-02-02 NOTE — Anesthesia Preprocedure Evaluation (Addendum)
Anesthesia Evaluation  Patient identified by MRN, date of birth, ID band Patient awake    Reviewed: Allergy & Precautions, NPO status , Patient's Chart, lab work & pertinent test results  History of Anesthesia Complications Negative for: history of anesthetic complications  Airway Mallampati: III  TM Distance: >3 FB Neck ROM: full    Dental  (+) Missing, Upper Dentures, Dental Advidsory Given   Pulmonary neg shortness of breath, neg COPD,    Pulmonary exam normal        Cardiovascular hypertension, + CAD and +CHF  (-) CABG Normal cardiovascular exam+ dysrhythmias (-) Cardiac Defibrillator + Valvular Problems/Murmurs AS      Neuro/Psych negative neurological ROS  negative psych ROS   GI/Hepatic negative GI ROS, Neg liver ROS,   Endo/Other  negative endocrine ROS  Renal/GU      Musculoskeletal   Abdominal   Peds  Hematology negative hematology ROS (+)   Anesthesia Other Findings Past Medical History: 10/14/2017: Ankle fracture No date: Chronic heart failure with preserved ejection fraction  (HFpEF) (HCC) No date: Heart murmur No date: Hypertension No date: LBBB (left bundle branch block) No date: LVH (left ventricular hypertrophy)     Comment:  a. 06/2016 Echo: EF 60-65%, no rwma, mild MR. PASP               ; b. 01/2021 Echo: EF 55-60%, sev assym septal               hypertrophy (LV basal-mid septum up to 1.5cm). No SAM. No              rwma. GrIDD. Nl RV fxn. Mild Ao sclerosis. No date: Palpitations     Comment:  a. 07/2016 Holter: Avg HR 77 (54-117). 59 isolated PVCs.               Rare PACs (<1%) w/ 3 atrial runs - longest 7 beats,               fastest 170 bpm x 4 beats - PAT. 02/16/2018: PNA (pneumonia)  Past Surgical History: 1986: APPENDECTOMY 1995 and 1999: EYE SURGERY; Bilateral     Comment:  Lens implants 1986: WRIST SURGERY; Left     Comment:  ORIF   BMI    Body Mass Index: 23.48 kg/m       Reproductive/Obstetrics negative OB ROS                             Anesthesia Physical Anesthesia Plan  ASA: 3  Anesthesia Plan: General/Spinal   Post-op Pain Management:    Induction:   PONV Risk Score and Plan: 3  Airway Management Planned: Natural Airway and Nasal Cannula  Additional Equipment:   Intra-op Plan:   Post-operative Plan:   Informed Consent: I have reviewed the patients History and Physical, chart, labs and discussed the procedure including the risks, benefits and alternatives for the proposed anesthesia with the patient or authorized representative who has indicated his/her understanding and acceptance.     Dental Advisory Given  Plan Discussed with: Anesthesiologist, CRNA and Surgeon  Anesthesia Plan Comments: (Patient reports no bleeding problems and no anticoagulant use.  Plan for spinal with backup GA  Patient consented for risks of anesthesia including but not limited to:  - adverse reactions to medications - damage to Kuhlmann, teeth, lips or other oral mucosa - nerve damage due to positioning  - risk of bleeding, infection and or  nerve damage from spinal that could lead to paralysis - risk of headache or failed spinal - damage to teeth, lips or other oral mucosa - sore throat or hoarseness - damage to heart, brain, nerves, lungs, other parts of body or loss of life  Patient voiced understanding.)        Anesthesia Quick Evaluation

## 2022-02-02 NOTE — Op Note (Signed)
DATE OF SURGERY:  02/02/2022  TIME: 12:32 PM  PATIENT NAME:  Laurie Patel  AGE: 88 y.o.  PRE-OPERATIVE DIAGNOSIS: Left intertrochanteric hip fracture  POST-OPERATIVE DIAGNOSIS:  SAME  PROCEDURE: Intramedullary fixation for left intertrochanteric hip fracture  SURGEON:  Juanell Fairly  OPERATIVE IMPLANTS: Biomet short Affixus nail 11 x , 100 mm lag screw with a 38 mm distal interlocking screw  PREOPERATIVE INDICATIONS:  Laurie Patel is a 88 y.o. year old who fell at home and suffered a left intertrochanteric hip fracture confirmed by x-ray in the emergency department.. She was admitted to the hospital service and medically cleared for surgical intervention.  Patient was not on anticoagulation at home.  The risks, benefits and alternatives were discussed with the patient and their family.  The risks include but are not limited to infection, bleeding, nerve or blood vessel injury, malunion, nonunion, hardware prominence, hardware failure, change in leg lengths or lower extremity rotation need for further surgery including hardware removal with conversion to a total hip arthroplasty. Medical risks include but are not limited to DVT and pulmonary embolism, myocardial infarction, stroke, pneumonia, respiratory failure and death. The patient and their family understood these risks and wished to proceed with surgery.  OPERATIVE PROCEDURE:  The patient was brought to the operating room and placed in the supine position on the fracture table.  General anesthesia was administered.  A time out was performed to verify the patient's name, date of birth, medical record number, correct site of surgery correct procedure to be performed. The timeout was also used to verify the patient received antibiotics and all appropriate instruments, implants and radiographic studies were available in the room. Once all in attendance were in agreement, the case began. A closed reduction was performed under C-arm  guidance.  The fracture reduction was confirmed on both AP and lateral views.  The patient was prepped and draped in a sterile fashion. She received preoperative antibiotics with 2 g of Ancef IV.  An incision was made proximal to the greater trochanter in line with the femur. A guidewire was placed over the tip of the greater trochanter and advanced into the proximal femur to the level of the lesser trochanter.  Confirmation of the drill pin position was made on AP and lateral C-arm images.  The threaded guidepin was then overdrilled with the proximal femoral drill.  The nail was then inserted into the proximal femur, across the fracture site and into the femoral shaft. Its position was confirmed on AP and lateral C-arm images.   Once the nail was completely seated, the drill guide for the lag screw was placed through the guide arm for the Affixus nail. A guidepin was then placed through this drill guide and advanced through the lateral cortex of the femur, across the fracture site and into the femoral head achieving a tip apex distance of less than 25 mm. The depth of the drill pin was measured, and then the drill for the lag screw was advanced over the guidepin and through the lateral cortex, across the fracture site and up into the femoral head to the depth previously measured.  The lag screw was then advanced by hand into position across the fracture site into the femoral head. Its final position was confirmed on AP and lateral C-arm images. Compression was applied as traction was carefully released. The set screw in the top of the intramedullary rod was tightened by hand using a screwdriver. It was backed off a quarter turn  to allow for compression at the fracture site.  The drill sleeve for the distal interlocking screw was then placed through the Affixus guide arm. A small stab incision was made to allow the drill guide to approximate the lateral cortex of the femur. The drill for the distal  interlocking screw was then advanced bicortically. The depth of this drill was measured. A distal interlocking screw with the length of 38 was then inserted by hand through the guide arm. Final C-arm images of the entire intramedullary construct were taken in both the AP and lateral planes.   The wounds were irrigated copiously and closed with 0 Vicryl for closure of the deep fascia and 2-0 Vicryl for subcutaneous closure. The skin was approximated with staples. A dry sterile dressing was applied. I was scrubbed and present the entire case and all sharp and instrument counts were correct at the conclusion of the case. Patient was transferred to hospital bed and brought to PACU in stable condition. I spoke with the patient's daughter by phone from the PACU to let her know the case had been performed without complication and the patient was stable in the recovery room.     Kathreen Devoid, MD

## 2022-02-02 NOTE — ED Notes (Signed)
Patient refusing arm sling at this time due to being hooked up to the monitor and having wires and an IV

## 2022-02-02 NOTE — Assessment & Plan Note (Addendum)
This is diet controlled.  She had mild hyperglycemia here.  Last hemoglobin A1c was 7.9%.   - Continue SS corrections

## 2022-02-03 DIAGNOSIS — S42202A Unspecified fracture of upper end of left humerus, initial encounter for closed fracture: Secondary | ICD-10-CM | POA: Diagnosis not present

## 2022-02-03 DIAGNOSIS — S72142A Displaced intertrochanteric fracture of left femur, initial encounter for closed fracture: Secondary | ICD-10-CM | POA: Diagnosis not present

## 2022-02-03 DIAGNOSIS — E119 Type 2 diabetes mellitus without complications: Secondary | ICD-10-CM | POA: Diagnosis not present

## 2022-02-03 DIAGNOSIS — I10 Essential (primary) hypertension: Secondary | ICD-10-CM | POA: Diagnosis not present

## 2022-02-03 LAB — BASIC METABOLIC PANEL
Anion gap: 5 (ref 5–15)
BUN: 21 mg/dL (ref 8–23)
CO2: 30 mmol/L (ref 22–32)
Calcium: 9 mg/dL (ref 8.9–10.3)
Chloride: 103 mmol/L (ref 98–111)
Creatinine, Ser: 0.75 mg/dL (ref 0.44–1.00)
GFR, Estimated: >60 mL/min (ref >60)
Glucose, Bld: 182 mg/dL — ABNORMAL HIGH (ref 70–99)
Potassium: 4.1 mmol/L (ref 3.5–5.1)
Sodium: 138 mmol/L (ref 135–145)

## 2022-02-03 LAB — CBC
HCT: 33.7 % — ABNORMAL LOW (ref 36.0–46.0)
Hemoglobin: 10.8 g/dL — ABNORMAL LOW (ref 12.0–15.0)
MCH: 27.1 pg (ref 26.0–34.0)
MCHC: 32 g/dL (ref 30.0–36.0)
MCV: 84.7 fL (ref 80.0–100.0)
Platelets: 200 10*3/uL (ref 150–400)
RBC: 3.98 MIL/uL (ref 3.87–5.11)
RDW: 13.4 % (ref 11.5–15.5)
WBC: 17.7 10*3/uL — ABNORMAL HIGH (ref 4.0–10.5)
nRBC: 0 % (ref 0.0–0.2)

## 2022-02-03 LAB — VITAMIN D 25 HYDROXY (VIT D DEFICIENCY, FRACTURES): Vit D, 25-Hydroxy: 52.2 ng/mL (ref 30–100)

## 2022-02-03 LAB — GLUCOSE, CAPILLARY
Glucose-Capillary: 161 mg/dL — ABNORMAL HIGH (ref 70–99)
Glucose-Capillary: 187 mg/dL — ABNORMAL HIGH (ref 70–99)
Glucose-Capillary: 133 mg/dL — ABNORMAL HIGH (ref 70–99)
Glucose-Capillary: 149 mg/dL — ABNORMAL HIGH (ref 70–99)

## 2022-02-03 NOTE — Evaluation (Signed)
Occupational Therapy Evaluation Patient Details Name: Laurie Patel MRN: [id number] DOB: 1928/01/08 Today's Date: 02/03/2022   History of Present Illness presented to ER secondary to mechanical fall in home environment, acute onset of L hip, L shoulder pain; admitted for management of L comminuted intertrochanteric hip fracture s/p ORIF (02/02/22), L shoulder immobilized/NWB   Clinical Impression   Laurie Patel presents with generalized weakness, limited endurance, fatigue, impaired balance, and pain. Pt lives with her daughter in a single-story home, 3 STE, and has been Mod IND. She performs cooking, cleaning, shopping, gardening, and able to complete all ADLs without assistance. The fall that necessitated this hospital admission is her only fall in previous 12 months. Pt reports mild-mod pain, nausea, and fatigue at present; requires Max A + 2 for coming into standing from recliner and for step-pivot transfer from recliner to bed, while demonstrating poor standing balance. Requires Mod A for bed mobility, Max A for elevating L LE to bed level. Pt able to recall WB precautions for L UE and L LE. Discussed DC recs, with pt stating she had initially hoped to return home with [place], but, given her difficulty with transfers, standing balance, bed mobility this AM, she now recognizes that STR stay will best support her return to PLOF. Will continue to offer OT services while hospitalized.    Recommendations for follow up therapy are one component of a multi-disciplinary discharge planning process, led by the attending physician.  Recommendations may be updated based on patient status, additional functional criteria and insurance authorization.   Follow Up Recommendations  Skilled nursing-short term rehab (<3 hours/day)    Assistance Recommended at Discharge Frequent or constant Supervision/Assistance  Patient can return home with the following A lot of help with walking and/or transfers;A lot of help with  bathing/dressing/bathroom;Assist for transportation;Help with stairs or ramp for entrance    Functional Status Assessment  Patient has had a recent decline in their functional status and demonstrates the ability to make significant improvements in function in a reasonable and predictable amount of time.  Equipment Recommendations  None recommended by OT    Recommendations for Other Services       Precautions / Restrictions Precautions Precautions: Fall Required Braces or Orthoses: Sling Restrictions Weight Bearing Restrictions: Yes LUE Weight Bearing: Non weight bearing LLE Weight Bearing: Weight bearing as tolerated      Mobility Bed Mobility Overal bed mobility: Needs Assistance Bed Mobility: Sit to Supine       Sit to supine: Mod assist        Transfers Overall transfer level: Needs assistance Equipment used: 2 person hand held assist Transfers: Sit to/from Stand Sit to Stand: Max assist           General transfer comment: Max A +2 for transfer recliner to bed      Balance Overall balance assessment: Needs assistance Sitting-balance support: No upper extremity supported, Feet supported Sitting balance-Leahy Scale: Good     Standing balance support: Single extremity supported Standing balance-Leahy Scale: Poor                             ADL either performed or assessed with clinical judgement   ADL Overall ADL's : Needs assistance/impaired Eating/Feeding: Modified independent;Set up Eating/Feeding Details (indicate cue type and reason): endorses difficulty eating, 2/2 nausea Grooming: Wash/dry hands;Wash/dry face;Oral care;Bed level;Set up;Supervision/safety               Lower  Body Dressing: Moderate assistance                       Vision         Perception     Praxis      Pertinent Vitals/Pain Pain Assessment Pain Score: 4  Pain Location: L hip, shoulder Pain Descriptors / Indicators: Aching, Grimacing,  Guarding Pain Intervention(s): Repositioned, Monitored during session, Limited activity within patient's tolerance     Hand Dominance Right   Extremity/Trunk Assessment Upper Extremity Assessment Upper Extremity Assessment: LUE deficits/detail;RUE deficits/detail RUE Deficits / Details: WFL LUE Deficits / Details: ROM limited to ~ 30 shoulder flexion LUE: Shoulder pain with ROM LUE Sensation: WNL   Lower Extremity Assessment Lower Extremity Assessment: LLE deficits/detail LLE Deficits / Details: 3/5 strength throughout, limited ROM, pain LLE Sensation: WNL       Communication Communication Communication: No difficulties   Cognition Arousal/Alertness: Awake/alert Behavior During Therapy: WFL for tasks assessed/performed Overall Cognitive Status: Within Functional Limits for tasks assessed                                 General Comments: pleasant, talkative, alert     General Comments       Exercises Other Exercises Other Exercises: Educ re: WB precautions, sling donning/doffing, UE dressing techniques, DC recs   Shoulder Instructions      Home Living Family/patient expects to be discharged to:: Private residence Living Arrangements: Children Available Help at Discharge: Family;Available PRN/intermittently Type of Home: House Home Access: Stairs to enter Entrance Stairs-Number of Steps: 3 Entrance Stairs-Rails: Right Home Layout: One level     Bathroom Shower/Tub: Walk-in shower         Home Equipment: Agricultural consultant (2 wheels);Rollator (4 wheels);Cane - quad          Prior Functioning/Environment Prior Level of Function : Independent/Modified Independent             Mobility Comments: Mod indep with ADLs, household and limited community mobilization, intermittent use of RW/quad cane/rollator as needed. Does endorse balance deficits at baseline. ADLs Comments: Performs ADLs IND, cooks, cleans. Has given up driving recently, but  continues to do her own grocery shopping, etc., w/ daugther driving her        OT Problem List: Decreased strength;Decreased range of motion;Decreased activity tolerance;Impaired UE functional use;Impaired balance (sitting and/or standing);Pain      OT Treatment/Interventions: Self-care/ADL training;Therapeutic exercise;Patient/family education;Balance training;Energy conservation;Therapeutic activities;DME and/or AE instruction    OT Goals(Current goals can be found in the care plan section) Acute Rehab OT Goals Patient Stated Goal: to get back to gardening ASAP OT Goal Formulation: With patient Time For Goal Achievement: 02/17/22 Potential to Achieve Goals: Good ADL Goals Pt Will Perform Lower Body Dressing: with modified independence;sitting/lateral leans Pt Will Transfer to Toilet: with modified independence;stand pivot transfer (using LRAD) Pt Will Perform Tub/Shower Transfer: Shower transfer;with modified independence;rolling walker  OT Frequency: Min 2X/week    Co-evaluation              AM-PAC OT "6 Clicks" Daily Activity     Outcome Measure Help from another person eating meals?: None Help from another person taking care of personal grooming?: None Help from another person toileting, which includes using toliet, bedpan, or urinal?: A Lot Help from another person bathing (including washing, rinsing, drying)?: A Lot Help from another person to put on and taking off  regular upper body clothing?: A Little Help from another person to put on and taking off regular lower body clothing?: A Lot 6 Click Score: 17   End of Session Nurse Communication: Mobility status  Activity Tolerance: Patient tolerated treatment well;Patient limited by fatigue Patient left: in bed;with call bell/phone within reach;with bed alarm set  OT Visit Diagnosis: Unsteadiness on feet (R26.81);Muscle weakness (generalized) (M62.81)                Time: 5329-9242 OT Time Calculation (min): 27  min Charges:  OT General Charges $OT Visit: 1 Visit OT Evaluation $OT Eval Low Complexity: 1 Low OT Treatments $Self Care/Home Management : 23-37 mins Latina Craver, PhD, MS, OTR/L 02/03/22, 12:11 PM

## 2022-02-03 NOTE — TOC Progression Note (Signed)
Transition of Care [facility]) - Progression Note    Patient Details  Name: Laurie Patel MRN: [id number] Date of Birth: December 28, 1927  Transition of Care [facility]) CM/SW Contact  Marlowe Sax, RN Phone Number: 02/03/2022, 8:36 AM  Clinical Narrative:     Patient is from home where she usually uses a walker to ambulate, Therapy to evaluate and make recommendations, TOC to assist with DC planning        Expected Discharge Plan and Services                                                 Social Determinants of Health (SDOH) Interventions    Readmission Risk Interventions     No data to display

## 2022-02-03 NOTE — Plan of Care (Signed)
  Problem: Education: Goal: Knowledge of General Education information will improve Description: Including pain rating scale, medication(s)/side effects and non-pharmacologic comfort measures Outcome: Progressing   Problem: Clinical Measurements: Goal: Cardiovascular complication will be avoided Outcome: Progressing   Problem: Elimination: Goal: Will not experience complications related to urinary retention Outcome: Progressing   Problem: Pain Managment: Goal: General experience of comfort will improve Outcome: Progressing   Problem: Safety: Goal: Ability to remain free from injury will improve Outcome: Progressing

## 2022-02-03 NOTE — Evaluation (Signed)
Physical Therapy Evaluation Patient Details Name: Laurie Patel MRN: [id number] DOB: 06/24/1928 Today's Date: 02/03/2022  History of Present Illness  presented to ER secondary to mechanical fall in home environment, acute onset of L hip, L shoulder pain; admitted for management of L comminuted intertrochanteric hip fracture s/p ORIF (02/02/22), L shoulder immobilized/NWB  Clinical Impression  Patient resting in bed upon arrival to room; alert and oriented, follows commands and eager for edge of bed/OOB activities.  Endorses minimal pain to L UE/LE, denies medication needs at this time.  L UE noted without sling in place; applied and adjusted for proper fit/protection for participation with session.  L shoulder immobilized; however, elbow, wrist and hand grossly WFL for strength and ROM.  L LE grossly 3-/5 throughout, limited by pain in hip. Currently requiring mod assist for bed mobility and scooting towards edge of bed; mod/max assist +2 for sit/stand, standing balance and SPT from bed/chair.  Extensive assist for lift off, anterior weight translation and postural extension; difficulty with closed-chain extension and WBing L LE; requiring blocking of L hip/knee in loading efforts throughout transfer.  Anticipate patient may benefit from trial of scoot/squat pivot for subsequent transfers until L LE more stable in [place]. Would benefit from skilled PT to address above deficits and promote optimal return to PLOF. ;recommend transition to STR upon discharge from acute hospitalization.         Recommendations for follow up therapy are one component of a multi-disciplinary discharge planning process, led by the attending physician.  Recommendations may be updated based on patient status, additional functional criteria and insurance authorization.  Follow Up Recommendations Skilled nursing-short term rehab (<3 hours/day)      Assistance Recommended at Discharge Frequent or constant  Supervision/Assistance  Patient can return home with the following  Two people to help with walking and/or transfers;Two people to help with bathing/dressing/bathroom;Assistance with cooking/housework;Help with stairs or ramp for entrance;Assist for transportation    Equipment Recommendations    Recommendations for Other Services       Functional Status Assessment Patient has had a recent decline in their functional status and demonstrates the ability to make significant improvements in function in a reasonable and predictable amount of time.     Precautions / Restrictions Precautions Precautions: Fall Required Braces or Orthoses: Sling Restrictions Weight Bearing Restrictions: Yes LUE Weight Bearing: Non weight bearing LLE Weight Bearing: Weight bearing as tolerated      Mobility  Bed Mobility Overal bed mobility: Needs Assistance Bed Mobility: Supine to Sit     Supine to sit: Mod assist          Transfers Overall transfer level: Needs assistance Equipment used: 2 person hand held assist Transfers: Sit to/from Stand Sit to Stand: Mod assist, Max assist, +2 physical assistance           General transfer comment: R HHA; extensive assist for lift off, postural extension, anterior weight translation    Ambulation/Gait               General Gait Details: unsafe/unable  Stairs            Wheelchair Mobility    Modified Rankin (Stroke Patients Only)       Balance Overall balance assessment: Needs assistance Sitting-balance support: No upper extremity supported, Feet supported Sitting balance-Leahy Scale: Good     Standing balance support: Single extremity supported Standing balance-Leahy Scale: Poor  Pertinent Vitals/Pain Pain Assessment Pain Assessment: Faces Faces Pain Scale: Hurts even more Pain Location: L hip Pain Descriptors / Indicators: Aching, Grimacing Pain Intervention(s): Limited  activity within patient's tolerance, Monitored during session, Repositioned    Home Living Family/patient expects to be discharged to:: Private residence Living Arrangements: Children Available Help at Discharge: Family;Available PRN/intermittently Type of Home: House Home Access: Stairs to enter Entrance Stairs-Rails: Right Entrance Stairs-Number of Steps: 3   Home Layout: One level Home Equipment: Agricultural consultant (2 wheels);Cane - single point      Prior Function Prior Level of Function : Independent/Modified Independent             Mobility Comments: Mod indep with ADLs, household and limited community mobilization, intermittent use of RW/SPC as needed.  Does endorse balance deficits at baseline       Hand Dominance   Dominant Hand: Right    Extremity/Trunk Assessment   Upper Extremity Assessment Upper Extremity Assessment:  (R UE grossly WFL; L UE immobilized in sling (elbow, wrist and hand ROM grossly WFL))    Lower Extremity Assessment Lower Extremity Assessment:  (R LE grossly 4+/5 throughout, L LE grossly 3-/5 throughout)       Communication   Communication: No difficulties  Cognition Arousal/Alertness: Awake/alert Behavior During Therapy: WFL for tasks assessed/performed Overall Cognitive Status: Within Functional Limits for tasks assessed                                          General Comments      Exercises Other Exercises Other Exercises: Educated in [place] precautions L UE/LE; patient voiced understanding, but requires consistent cuing for L UE immobilization and NWB.  Sling not donned upon arrival to room; place and adjusted for proper fit and protection to L UE Other Exercises: L LE supine therex, 1x10, act assist ROM: ankle pumps, quad sets, heel slides, hip abduct/adduct; seated LAQs   Assessment/Plan    PT Assessment Patient needs continued PT services  PT Problem List Decreased strength;Decreased range of motion;Decreased  activity tolerance;Decreased balance;Decreased mobility;Decreased coordination;Decreased knowledge of use of DME;Decreased safety awareness;Decreased knowledge of precautions;Pain       PT Treatment Interventions DME instruction;Gait training;Stair training;Functional mobility training;Therapeutic activities;Therapeutic exercise;Balance training;Patient/family education    PT Goals (Current goals can be found in the Care Plan section)  Acute Rehab PT Goals Patient Stated Goal: to get back home PT Goal Formulation: With patient Time For Goal Achievement: 02/17/22 Potential to Achieve Goals: Good    Frequency BID     Co-evaluation               AM-PAC PT "6 Clicks" Mobility  Outcome Measure Help needed turning from your back to your side while in a flat bed without using bedrails?: A Lot Help needed moving from lying on your back to sitting on the side of a flat bed without using bedrails?: A Lot Help needed moving to and from a bed to a chair (including a wheelchair)?: Total Help needed standing up from a chair using your arms (e.g., wheelchair or bedside chair)?: Total Help needed to walk in hospital room?: Total Help needed climbing 3-5 steps with a railing? : Total 6 Click Score: 8    End of Session Equipment Utilized During Treatment: Gait belt Activity Tolerance: Patient tolerated treatment well Patient left: in chair;with call bell/phone within reach;with chair alarm set Nurse  Communication: Mobility status PT Visit Diagnosis: Muscle weakness (generalized) (M62.81);Other abnormalities of gait and mobility (R26.89);Pain Pain - Right/Left: Left Pain - part of body: Hip    Time: 5176-1607 PT Time Calculation (min) (ACUTE ONLY): 31 min   Charges:   PT Evaluation $PT Eval Moderate Complexity: 1 Mod PT Treatments $Therapeutic Exercise: 8-22 mins       Jeralynn Vaquera H. Manson Passey, PT, DPT, NCS 02/03/22, 9:29 AM [phone]

## 2022-02-03 NOTE — Progress Notes (Signed)
Subjective:  POD #1 s/p IM fixation for left IT hip fracture.   Patient reports left hip  pain as mild.  She denies any significant left shoulder pain.  Patient is having nausea without vomiting.  Patient does not have an appetite.  She states she is passing a lot of gas.  Patient states she was able to get out of bed today with physical therapy.  Objective:   VITALS:   Vitals:   02/02/22 2039 02/03/22 0028 02/03/22 0443 02/03/22 0837  BP: (!) 133/58 (!) 151/56 (!) 159/56 (!) 162/79  Pulse: 93 94 98 (!) 105  Resp: 17 20 17    Temp: 98 F (36.7 C) 97.7 F (36.5 C) 98.8 F (37.1 C) 98.5 F (36.9 C)  TempSrc:      SpO2: (!) 88% 92% (!) 86% 90%  Weight:      Height:        PHYSICAL EXAM: Left lower extremity Neurovascular intact Sensation intact distally Intact pulses distally Dorsiflexion/Plantar flexion intact Patient has a small amount of sanguinous drainage on her superiormost dressing.  The distal 2 dressings are clean dry and intact.  There is no evidence for active bleeding. No cellulitis present around the surgical site. Left thigh compartments are soft compressible.  LABS  Results for orders placed or performed during the hospital encounter of 02/01/22 (from the past 24 hour(s))  Glucose, capillary     Status: Abnormal   Collection Time: 02/02/22  5:09 PM  Result Value Ref Range   Glucose-Capillary 258 (H) 70 - 99 mg/dL  Glucose, capillary     Status: Abnormal   Collection Time: 02/02/22 10:26 PM  Result Value Ref Range   Glucose-Capillary 224 (H) 70 - 99 mg/dL  CBC     Status: Abnormal   Collection Time: 02/03/22  3:07 AM  Result Value Ref Range   WBC 17.7 (H) 4.0 - 10.5 K/uL   RBC 3.98 3.87 - 5.11 MIL/uL   Hemoglobin 10.8 (L) 12.0 - 15.0 g/dL   HCT 02/05/22 (L) 32.3 - 55.7 %   MCV 84.7 80.0 - 100.0 fL   MCH 27.1 26.0 - 34.0 pg   MCHC 32.0 30.0 - 36.0 g/dL   RDW 32.2 02.5 - 42.7 %   Platelets 200 150 - 400 K/uL   nRBC 0.0 0.0 - 0.2 %  Basic metabolic panel      Status: Abnormal   Collection Time: 02/03/22  3:07 AM  Result Value Ref Range   Sodium 138 135 - 145 mmol/L   Potassium 4.1 3.5 - 5.1 mmol/L   Chloride 103 98 - 111 mmol/L   CO2 30 22 - 32 mmol/L   Glucose, Bld 182 (H) 70 - 99 mg/dL   BUN 21 8 - 23 mg/dL   Creatinine, Ser 02/05/22 0.44 - 1.00 mg/dL   Calcium 9.0 8.9 - 3.76 mg/dL   GFR, Estimated 28.3 >15 mL/min   Anion gap 5 5 - 15  VITAMIN D 25 Hydroxy (Vit-D Deficiency, Fractures)     Status: None   Collection Time: 02/03/22  3:07 AM  Result Value Ref Range   Vit D, 25-Hydroxy 52.20 30 - 100 ng/mL  Glucose, capillary     Status: Abnormal   Collection Time: 02/03/22  7:50 AM  Result Value Ref Range   Glucose-Capillary 161 (H) 70 - 99 mg/dL  Glucose, capillary     Status: Abnormal   Collection Time: 02/03/22 12:14 PM  Result Value Ref Range   Glucose-Capillary 187 (  H) 70 - 99 mg/dL    DG HIP UNILAT WITH PELVIS 2-3 VIEWS LEFT  Result Date: 02/02/2022 CLINICAL DATA:  Fluoroscopic assistance for internal fixation of fracture of left femur EXAM: DG HIP (WITH OR WITHOUT PELVIS) 2-3V LEFT COMPARISON:  02/01/2022 FINDINGS: Fluoroscopic images show reduction and internal fixation of comminuted intertrochanteric fracture of left femur with intramedullary rod. Fluoroscopy time 40 seconds. Radiation dose 4.018 mGy. IMPRESSION: Fluoroscopic assistance was provided for internal fixation of intertrochanteric fracture of left femur. Electronically Signed   By: Ernie Avena M.D.   On: 02/02/2022 13:35   DG Hip Port Unilat With Pelvis 1V Left  Result Date: 02/02/2022 CLINICAL DATA:  Internal fixation of fracture of left femur EXAM: DG HIP (WITH OR WITHOUT PELVIS) 1V PORT LEFT COMPARISON:  02/01/2022 FINDINGS: There is internal fixation of comminuted intertrochanteric fracture of proximal left femur with intramedullary rod. There are pockets of air in the soft tissues from recent surgery. There are coarse calcifications in the soft tissues medial  to the proximal femur and adjacent to the left ischium. This may be residual from previous soft tissue injury. IMPRESSION: Reduction and internal fixation of comminuted intertrochanteric fracture of proximal left femur. Electronically Signed   By: Ernie Avena M.D.   On: 02/02/2022 13:34   DG C-Arm 1-60 Min-No Report  Result Date: 02/02/2022 Fluoroscopy was utilized by the requesting physician.  No radiographic interpretation.   DG Chest 1 View  Result Date: 02/01/2022 CLINICAL DATA:  Trauma, fall EXAM: CHEST  1 VIEW COMPARISON:  09/08/2021 FINDINGS: Transverse diameter of heart is increased. There are no signs of pulmonary edema or focal pulmonary consolidation. There is no pleural effusion or pneumothorax. There is mild deformity in the posterolateral aspect of left fourth rib, possibly suggesting old fracture. Osteopenia is seen in bony structures. IMPRESSION: Cardiomegaly. There are no signs of pulmonary edema or focal pulmonary consolidation. Electronically Signed   By: Ernie Avena M.D.   On: 02/01/2022 19:16   DG Shoulder Left  Result Date: 02/01/2022 CLINICAL DATA:  Trauma, fall, pain EXAM: LEFT SHOULDER - 2+ VIEW COMPARISON:  None Available. FINDINGS: There is cortical irregularity along the medial margin of neck cough proximal left humerus. There is faint sclerosis in the neck of the left femur. Degenerative changes are noted in left AC joint. Osteopenia is seen in bony structures. Degenerative changes noted in cervical spine. IMPRESSION: Deformity in the neck of the left humerus suggests recent impacted fracture. Less likely possibility would be residual change from previous injury. Electronically Signed   By: Ernie Avena M.D.   On: 02/01/2022 19:14   DG Hip Unilat W or Wo Pelvis 2-3 Views Left  Result Date: 02/01/2022 CLINICAL DATA:  Trauma, fall EXAM: DG HIP (WITH OR WITHOUT PELVIS) 2-3V LEFT COMPARISON:  None Available. FINDINGS: There is comminuted  intertrochanteric fracture of proximal left femur. There is slight overriding of fracture fragments in the medial aspect. There is slight medial displacement of lesser trochanter. Osteopenia is seen in bony structures. Degenerative changes are noted in lower lumbar spine. There are soft tissue calcifications adjacent to the ischium. IMPRESSION: Comminuted intertrochanteric fracture is seen in the proximal left femur. Electronically Signed   By: Ernie Avena M.D.   On: 02/01/2022 19:12    Assessment/Plan: 1 Day Post-Op   Principal Problem:   Closed intertrochanteric fracture of hip, left, initial encounter [facility]) Active Problems:   Controlled type 2 diabetes mellitus without complication, without long-term current use of insulin (HCC)  Hyperlipidemia   Chronic diastolic CHF (congestive heart failure) (HCC)   Essential hypertension   Closed fracture of left proximal humerus  Patient stable postop.  Hemoglobin and hematocrit are within acceptable limits.  Patient has elevated blood sugar at 189.  This will have to be monitored carefully as evaded blood sugars can lead to postoperative infection.  Patient has refused insulin but this will need to be revisited based on her blood sugars moving forward.  Continue physical therapy as tolerated.  Patient is nonweightbearing on the left upper extremity but weightbearing as tolerated on the left lower extremity.    Juanell Fairly , MD 02/03/2022, 1:38 PM

## 2022-02-03 NOTE — Progress Notes (Signed)
Initial Nutrition Assessment  DOCUMENTATION CODES:   Not applicable  INTERVENTION:  Encourage adequate PO intake of regular diet Ensure Enlive po BID, each supplement provides 350 kcal and 20 grams of protein. Continue MVI with minerals daily  NUTRITION DIAGNOSIS:   Increased nutrient needs related to post-op healing, hip fracture as evidenced by estimated needs  GOAL:   Patient will meet greater than or equal to 90% of their needs  MONITOR:   PO intake, Supplement acceptance, Labs, Skin, Weight trends  REASON FOR ASSESSMENT:   Consult Assessment of nutrition requirement/status, Hip fracture protocol  ASSESSMENT:   Pt admitted from home after a fall leading to L hip fracture. PMH significant for HTN, DM, dCHF, and LBBB.  8/13-s/p IMN of L intertrochanteric hip fracture  Unsuccessful attempt to reach patient via phone call to room.   No documented meal completions on file.  Reviewed wt history. Pt's wt appears to be trending down very slow and gradually since 02/09. No significant wt loss noted. Will continue to monitor throughout admission.   Patient would benefit from the addition of a nutrition supplement given increased nutrition needs for post-op healing and hip fracture.   Medications: Vitamin D3, colace, lasix, SSI 0-5 units qhs, SSI 0-9 units TID, MVI, senna  Labs: HgbA1c 7.9%, CBG's 161-258 x24 hours  NUTRITION - FOCUSED PHYSICAL EXAM: RD working remotely. Deferred to follow up.  Diet Order:   Diet Order             Diet regular Room service appropriate? Yes; Fluid consistency: Thin  Diet effective now                   EDUCATION NEEDS:   No education needs have been identified at this time  Skin:  Skin Assessment: Skin Integrity Issues: Skin Integrity Issues:: Incisions Incisions: L hip (closed)  Last BM:  8/11  Height:   Ht Readings from Last 1 Encounters:  02/01/22 5' 5.5" (1.664 m)    Weight:   Wt Readings from Last 1  Encounters:  02/01/22 65 kg   BMI:  Body mass index is 23.48 kg/m.  Estimated Nutritional Needs:   Kcal:  1400-1600  Protein:  75-90g  Fluid:  >/=1.4L  Drusilla Kanner, RDN, LDN Clinical Nutrition

## 2022-02-03 NOTE — Progress Notes (Signed)
  Progress Note   Patient: Laurie Patel YKD:[id number] DOB: 1928-05-27 DOA: 02/01/2022     2 DOS: the patient was seen and examined on 02/03/2022       Brief hospital course: Laurie Patel is a 88 y.o. F with HTN, DM, dCHF, and hx LBBB who presented with a mechanical fall and left hip pain.  In the ER, radiograph showed left hip fracture.  Ortho consulted.     Assessment and Plan: * Closed intertrochanteric fracture of hip, left, initial encounter (HCC) S/p Intramedullary fixation for left intertrochanteric hip fracturby Dr. Martha Patel - Lovenox for dvt ppx - I do not see further orders for ppx or weight bearing status from Orthopedics  - PT eval - Follow vitamin D  Essential hypertension - Resume lisinopril - Continue furosemide  Chronic diastolic CHF (congestive heart failure) (HCC) Appears euvolemic - Continue Lasix  Closed fracture of left proximal humerus -  Nonweightbearing to left upper extremity - Maintain left arm in sling  Hyperlipidemia Not on a statin, presumably due to age  Controlled type 2 diabetes mellitus without complication, without long-term current use of insulin (HCC) This is diet controlled.  She had hypoglycemia overnight.  Last hemoglobin A1c was 7.9%.  She is refusing insulin here - We will continue to offer sliding scale corrections in the hospital          Subjective: Having some nausea today, no fever, no confusion.  No chest or abdominal symptoms.  Some asymptomatic hypoxia overight, liekly atelectasis.  Weaned off O2.      Physical Exam: Vitals:   02/02/22 2039 02/03/22 0028 02/03/22 0443 02/03/22 0837  BP: (!) 133/58 (!) 151/56 (!) 159/56 (!) 162/79  Pulse: 93 94 98 (!) 105  Resp: 17 20 17    Temp: 98 F (36.7 C) 97.7 F (36.5 C) 98.8 F (37.1 C) 98.5 F (36.9 C)  TempSrc:      SpO2: (!) 88% 92% (!) 86% 90%  Weight:      Height:       Elderly adult female, sitting up in recliner, appears tired, nauseated, no acute  distress RRR, no murmurs, no peripheral edema Respiratory rate normal, lungs clear without rales or wheezes Attention normal, affect appropriate, judgment and insight appear normal Distal perfusion normal, sensation to bilateral feet normal    Data Reviewed: Glucose elevated Blood cell count up to 17 K Hemoglobin slightly down to 10.8, not clinically significant Basic metabolic panel normal  Family Communication: None present at the bedside    Disposition: Status is: Inpatient The patient was admitted with hip fracture and left humeral fracture.  She has had operative fixation of the left hip, and physical therapy recommends SNF.  The patient is stated she does not intend to go to SNF, she is medically progressing fine, will need to continue rehabilitation and determine disposition        Author: , MD 02/03/2022 11:07 AM  For on call review [email or web address].

## 2022-02-04 ENCOUNTER — Encounter: Payer: Self-pay | Admitting: Orthopedic Surgery

## 2022-02-04 ENCOUNTER — Inpatient Hospital Stay (HOSPITAL_COMMUNITY)
Admit: 2022-02-04 | Discharge: 2022-02-04 | Disposition: A | Discharge status: Home / Self Care | Payer: Medicare Other | Attending: Family Medicine | Admitting: Family Medicine

## 2022-02-04 ENCOUNTER — Inpatient Hospital Stay: Payer: Medicare Other

## 2022-02-04 DIAGNOSIS — S72142A Displaced intertrochanteric fracture of left femur, initial encounter for closed fracture: Secondary | ICD-10-CM | POA: Diagnosis not present

## 2022-02-04 DIAGNOSIS — I4891 Unspecified atrial fibrillation: Secondary | ICD-10-CM

## 2022-02-04 DIAGNOSIS — I447 Left bundle-branch block, unspecified: Secondary | ICD-10-CM

## 2022-02-04 DIAGNOSIS — J9811 Atelectasis: Secondary | ICD-10-CM | POA: Clinically undetermined

## 2022-02-04 DIAGNOSIS — S42202A Unspecified fracture of upper end of left humerus, initial encounter for closed fracture: Secondary | ICD-10-CM | POA: Diagnosis not present

## 2022-02-04 DIAGNOSIS — I480 Paroxysmal atrial fibrillation: Secondary | ICD-10-CM | POA: Clinically undetermined

## 2022-02-04 DIAGNOSIS — I10 Essential (primary) hypertension: Secondary | ICD-10-CM | POA: Diagnosis not present

## 2022-02-04 DIAGNOSIS — I5032 Chronic diastolic (congestive) heart failure: Secondary | ICD-10-CM | POA: Diagnosis not present

## 2022-02-04 LAB — BASIC METABOLIC PANEL
Anion gap: 8 (ref 5–15)
BUN: 29 mg/dL — ABNORMAL HIGH (ref 8–23)
CO2: 29 mmol/L (ref 22–32)
Calcium: 9.3 mg/dL (ref 8.9–10.3)
Chloride: 99 mmol/L (ref 98–111)
Creatinine, Ser: 0.82 mg/dL (ref 0.44–1.00)
GFR, Estimated: >60 mL/min (ref >60)
Glucose, Bld: 210 mg/dL — ABNORMAL HIGH (ref 70–99)
Potassium: 4 mmol/L (ref 3.5–5.1)
Sodium: 136 mmol/L (ref 135–145)

## 2022-02-04 LAB — GLUCOSE, CAPILLARY
Glucose-Capillary: 191 mg/dL — ABNORMAL HIGH (ref 70–99)
Glucose-Capillary: 207 mg/dL — ABNORMAL HIGH (ref 70–99)
Glucose-Capillary: 208 mg/dL — ABNORMAL HIGH (ref 70–99)
Glucose-Capillary: 184 mg/dL — ABNORMAL HIGH (ref 70–99)

## 2022-02-04 LAB — TROPONIN I (HIGH SENSITIVITY)
Troponin I (High Sensitivity): 178 ng/L (ref <18)
Troponin I (High Sensitivity): 174 ng/L (ref <18)

## 2022-02-04 LAB — ECHOCARDIOGRAM COMPLETE
AR max vel: 2.49 cm2
AV Area VTI: 2.64 cm2
AV Area mean vel: 2.34 cm2
AV Mean grad: 4 mmHg
AV Peak grad: 7.1 mmHg
Ao pk vel: 1.33 m/s
Area-P 1/2: 3.61 cm2
Height: 65.5 in
S' Lateral: 2.05 cm
Weight: 2292.78 oz

## 2022-02-04 LAB — CBC
HCT: 31.8 % — ABNORMAL LOW (ref 36.0–46.0)
Hemoglobin: 10.4 g/dL — ABNORMAL LOW (ref 12.0–15.0)
MCH: 27.7 pg (ref 26.0–34.0)
MCHC: 32.7 g/dL (ref 30.0–36.0)
MCV: 84.6 fL (ref 80.0–100.0)
Platelets: 205 10*3/uL (ref 150–400)
RBC: 3.76 MIL/uL — ABNORMAL LOW (ref 3.87–5.11)
RDW: 13.6 % (ref 11.5–15.5)
WBC: 18.5 10*3/uL — ABNORMAL HIGH (ref 4.0–10.5)
nRBC: 0 % (ref 0.0–0.2)

## 2022-02-04 LAB — PROTIME-INR
INR: 1.2 (ref 0.8–1.2)
Prothrombin Time: 14.9 seconds (ref 11.4–15.2)

## 2022-02-04 LAB — APTT: aPTT: 33 seconds (ref 24–36)

## 2022-02-04 LAB — D-DIMER, QUANTITATIVE: D-Dimer, Quant: 0.79 ug/mL-FEU — ABNORMAL HIGH (ref 0.00–0.50)

## 2022-02-04 LAB — BRAIN NATRIURETIC PEPTIDE: B Natriuretic Peptide: 483.6 pg/mL — ABNORMAL HIGH (ref 0.0–100.0)

## 2022-02-04 LAB — HEPARIN LEVEL (UNFRACTIONATED): Heparin Unfractionated: 0.37 IU/mL (ref 0.30–0.70)

## 2022-02-04 MED ORDER — IOHEXOL 350 MG/ML SOLN
80.0000 mL | Freq: Once | INTRAVENOUS | Status: AC | PRN
  Administered 2022-02-04: 75 mL via INTRAVENOUS

## 2022-02-04 MED ORDER — HEPARIN (PORCINE) 25000 UT/250ML-% IV SOLN
1250.0000 [IU]/h | INTRAVENOUS | Status: DC
  Administered 2022-02-04 – 2022-02-05 (×2): 950 [IU]/h via INTRAVENOUS
  Administered 2022-02-07: 1250 [IU]/h via INTRAVENOUS
  Filled 2022-02-04 (×4): qty 250

## 2022-02-04 MED ORDER — METOPROLOL TARTRATE 25 MG PO TABS
12.5000 mg | ORAL_TABLET | Freq: Four times a day (QID) | ORAL | Status: DC
  Administered 2022-02-04 – 2022-02-05 (×4): 12.5 mg via ORAL
  Filled 2022-02-04 (×5): qty 1

## 2022-02-04 MED ORDER — HEPARIN BOLUS VIA INFUSION
3250.0000 [IU] | Freq: Once | INTRAVENOUS | Status: AC
Start: 2022-02-04 — End: 2022-02-04
  Administered 2022-02-04: 3250 [IU] via INTRAVENOUS
  Filled 2022-02-04: qty 3250

## 2022-02-04 MED ORDER — ENSURE ENLIVE PO LIQD
237.0000 mL | Freq: Two times a day (BID) | ORAL | Status: DC
  Administered 2022-02-04 – 2022-02-08 (×9): 237 mL via ORAL

## 2022-02-04 MED ORDER — METOPROLOL TARTRATE 5 MG/5ML IV SOLN
5.0000 mg | Freq: Once | INTRAVENOUS | Status: AC
Start: 2022-02-04 — End: 2022-02-04
  Administered 2022-02-04: 5 mg via INTRAVENOUS
  Filled 2022-02-04: qty 5

## 2022-02-04 NOTE — TOC Progression Note (Signed)
Transition of Care [facility]) - Progression Note    Patient Details  Name: Laurie Patel MRN: [id number] Date of Birth: 1928/04/14  Transition of [facility] CM/SW Contact  Truddie Hidden, RN Phone Number: 02/04/2022, 1:55 PM  Clinical Narrative:    Attempt to speak with patient regarding discharge plan. Patient stated she was too sick to talk. Request for thisCM to come back tomorrow.         Expected Discharge Plan and Services                                                 Social Determinants of Health (SDOH) Interventions    Readmission Risk Interventions     No data to display

## 2022-02-04 NOTE — Progress Notes (Signed)
PT Cancellation Note  Patient Details Name: Laurie Patel MRN: [id number] DOB: March 12, 1928   Cancelled Treatment:     Therapist in to see pt this pm who declined again stating she wasn't feeling well. Will plan to re-attempt in am.   Jannet Askew 02/04/2022, 3:45 PM

## 2022-02-04 NOTE — Assessment & Plan Note (Addendum)
Resolving. The patient is currently saturating 94% on room air. Continue incentive spirometry.

## 2022-02-04 NOTE — Progress Notes (Addendum)
During 0400 rounds of vital signs patient was noted to have increase heart rate 125-135's  and decrease O2 Sat in the mid 80's room air. Patient was alert and oriented, denied c/o pain in chest, arms and face. Also decline scheduled pain medication stating that it made her stomach upset. Patient c/o nausea and noted burping. Tried several times to have patient take deep breaths, O2 increase to 93%, HR stayed elevated. Called made to [facility] F.NP Order for EKG, O2, and D-Dimer and call made to card by NP. Critical EKG. Paged NP and rapid response called. Patient continue to deny pain. Labs drawn. Orders continued to be followed as ordered. Made pt aware that we would call her daughter and give an update. Patient asked that we not call her daughter, that she was headed into the office this morning and would come by to see her later. Charge RN Rosey Bath, at bedside when patient made this statement.

## 2022-02-04 NOTE — Progress Notes (Signed)
Subjective:  POD #2 s/p IM fixation for left IT hip fracture.  Patient also being treated non-operatively for a left proximal humerus fracture.   Patient reports right hip and shoulder pain as mild.  Patient laying on her left side waiting for nursing staff to return with new diaper and bedding.  Patient has new onset paroxysmal a.fib begin over night.  Cardiology consulted and patient started on IV heparin and beta-blocker.  Objective:   VITALS:   Vitals:   02/04/22 0615 02/04/22 0732 02/04/22 0949 02/04/22 1342  BP: 135/60 127/72 131/60 (!) 165/62  Pulse: (!) 130 96 99 91  Resp: 18 16 18 18   Temp: 99.2 F (37.3 C) 98.8 F (37.1 C) 98 F (36.7 C) 98.1 F (36.7 C)  TempSrc:    Oral  SpO2: 97% 97% 97% 96%  Weight:      Height:        PHYSICAL EXAM: Left lower extremity: Cannot evaluate the dressing as the patient is lying on her left side. Neurovascular intact Sensation intact distally Intact pulses distally Dorsiflexion/Plantar flexion intact No cellulitis present Compartment soft  Left upper extremity: Skin intact.  NVI.  Active motion of left fingers, wrist and elbow.  LABS  Results for orders placed or performed during the hospital encounter of 02/01/22 (from the past 24 hour(s))  Glucose, capillary     Status: Abnormal   Collection Time: 02/03/22  6:48 PM  Result Value Ref Range   Glucose-Capillary 133 (H) 70 - 99 mg/dL  Glucose, capillary     Status: Abnormal   Collection Time: 02/03/22  9:22 PM  Result Value Ref Range   Glucose-Capillary 149 (H) 70 - 99 mg/dL  CBC     Status: Abnormal   Collection Time: 02/04/22  3:12 AM  Result Value Ref Range   WBC 18.5 (H) 4.0 - 10.5 K/uL   RBC 3.76 (L) 3.87 - 5.11 MIL/uL   Hemoglobin 10.4 (L) 12.0 - 15.0 g/dL   HCT 02/06/22 (L) 70.6 - 23.7 %   MCV 84.6 80.0 - 100.0 fL   MCH 27.7 26.0 - 34.0 pg   MCHC 32.7 30.0 - 36.0 g/dL   RDW 62.8 31.5 - 17.6 %   Platelets 205 150 - 400 K/uL   nRBC 0.0 0.0 - 0.2 %  Basic metabolic  panel     Status: Abnormal   Collection Time: 02/04/22  3:12 AM  Result Value Ref Range   Sodium 136 135 - 145 mmol/L   Potassium 4.0 3.5 - 5.1 mmol/L   Chloride 99 98 - 111 mmol/L   CO2 29 22 - 32 mmol/L   Glucose, Bld 210 (H) 70 - 99 mg/dL   BUN 29 (H) 8 - 23 mg/dL   Creatinine, Ser 02/06/22 0.44 - 1.00 mg/dL   Calcium 9.3 8.9 - 7.37 mg/dL   GFR, Estimated 10.6 >26 mL/min   Anion gap 8 5 - 15  D-dimer, quantitative     Status: Abnormal   Collection Time: 02/04/22  6:14 AM  Result Value Ref Range   D-Dimer, Quant 0.79 (H) 0.00 - 0.50 ug/mL-FEU  APTT     Status: None   Collection Time: 02/04/22  6:14 AM  Result Value Ref Range   aPTT 33 24 - 36 seconds  Protime-INR     Status: None   Collection Time: 02/04/22  6:14 AM  Result Value Ref Range   Prothrombin Time 14.9 11.4 - 15.2 seconds   INR 1.2 0.8 - 1.2  Troponin I (High Sensitivity)     Status: Abnormal   Collection Time: 02/04/22  6:17 AM  Result Value Ref Range   Troponin I (High Sensitivity) 178 (HH) <18 ng/L  Brain natriuretic peptide     Status: Abnormal   Collection Time: 02/04/22  6:18 AM  Result Value Ref Range   B Natriuretic Peptide 483.6 (H) 0.0 - 100.0 pg/mL  Glucose, capillary     Status: Abnormal   Collection Time: 02/04/22  7:55 AM  Result Value Ref Range   Glucose-Capillary 191 (H) 70 - 99 mg/dL  Glucose, capillary     Status: Abnormal   Collection Time: 02/04/22 11:30 AM  Result Value Ref Range   Glucose-Capillary 207 (H) 70 - 99 mg/dL  Troponin I (High Sensitivity)     Status: Abnormal   Collection Time: 02/04/22 12:43 PM  Result Value Ref Range   Troponin I (High Sensitivity) 174 (HH) <18 ng/L    ECHOCARDIOGRAM COMPLETE  Result Date: 02/04/2022    ECHOCARDIOGRAM REPORT   Patient Name:   Laurie Patel Date of Exam: 02/04/2022 Medical Rec #:  [id number]      Height:       65.5 in Accession #:    [id number]     Weight:       143.3 lb Date of Birth:  05/13/1928      BSA:          1.727 m Patient Age:    88  years       BP:           131/60 mmHg Patient Gender: F              HR:           90 bpm. Exam Location:  [facility] Procedure: 2D Echo, Cardiac Doppler and Color Doppler Indications:     I48.91 Atrial Fibrillation  History:         Patient has prior history of Echocardiogram examinations, most                  recent 01/30/2021. CHF, Arrythmias:Atrial Fibrillation; Risk                  Factors:Hypertension, Diabetes and Dyslipidemia.  Sonographer:     Ceasar Mons Referring Phys:  [id number] CHRISTOPHER P DANFORD Diagnosing Phys: Yvonne Kendall MD  Sonographer Comments: Suboptimal apical window and suboptimal subcostal window. Image acquisition challenging due to uncooperative patient. IMPRESSIONS  1. Left ventricular ejection fraction, by estimation, is 60 to 65%. The left ventricle has normal function. Left ventricular endocardial border not optimally defined to evaluate regional wall motion. There is moderate asymmetric left ventricular hypertrophy of the septal segment. Left ventricular diastolic parameters are indeterminate.  2. Right ventricular systolic function is normal. The right ventricular size is normal. There is moderately elevated pulmonary artery systolic pressure.  3. Left atrial size was mildly dilated.  4. The mitral valve is abnormal. No evidence of mitral valve regurgitation.  5. Tricuspid valve regurgitation is mild to moderate.  6. The aortic valve is tricuspid. Aortic valve regurgitation is not visualized. No aortic stenosis is present.  7. The inferior vena cava is normal in size with <50% respiratory variability, suggesting right atrial pressure of 8 mmHg. FINDINGS  Left Ventricle: Left ventricular ejection fraction, by estimation, is 60 to 65%. The left ventricle has normal function. Left ventricular endocardial border not optimally defined to evaluate regional wall motion. The left ventricular internal cavity size  was normal in size. There is moderate asymmetric left ventricular hypertrophy of  the septal segment. Left ventricular diastolic parameters are indeterminate. Right Ventricle: The right ventricular size is normal. No increase in right ventricular wall thickness. Right ventricular systolic function is normal. There is moderately elevated pulmonary artery systolic pressure. The tricuspid regurgitant velocity is 3.12 m/s, and with an assumed right atrial pressure of 8 mmHg, the estimated right ventricular systolic pressure is 46.9 mmHg. Left Atrium: Left atrial size was mildly dilated. Right Atrium: Right atrial size was normal in size. Pericardium: There is no evidence of pericardial effusion. Presence of epicardial fat layer. Mitral Valve: The mitral valve is abnormal. There is mild thickening of the mitral valve leaflet(s). There is mild calcification of the mitral valve leaflet(s). Mild to moderate mitral annular calcification. No evidence of mitral valve regurgitation. Tricuspid Valve: The tricuspid valve is normal in structure. Tricuspid valve regurgitation is mild to moderate. Aortic Valve: The aortic valve is tricuspid. Aortic valve regurgitation is not visualized. No aortic stenosis is present. Aortic valve mean gradient measures 4.0 mmHg. Aortic valve peak gradient measures 7.1 mmHg. Aortic valve area, by VTI measures 2.64 cm. Pulmonic Valve: The pulmonic valve was normal in structure. Pulmonic valve regurgitation is mild to moderate. No evidence of pulmonic stenosis. Aorta: The aortic root and ascending aorta are structurally normal, with no evidence of dilitation. Venous: The inferior vena cava is normal in size with less than 50% respiratory variability, suggesting right atrial pressure of 8 mmHg. IAS/Shunts: The interatrial septum was not well visualized.  LEFT VENTRICLE PLAX 2D LVIDd:         2.91 cm LVIDs:         2.05 cm LV PW:         1.00 cm LV IVS:        1.44 cm LVOT diam:     1.90 cm LV SV:         63 LV SV Index:   36 LVOT Area:     2.84 cm  RIGHT VENTRICLE RV Basal diam:   3.05 cm RV S prime:     11.40 cm/s TAPSE (M-mode): 2.4 cm LEFT ATRIUM           Index        RIGHT ATRIUM           Index LA diam:      3.10 cm 1.80 cm/m   RA Area:     16.50 cm LA Vol (A4C): 61.0 ml 35.33 ml/m  RA Volume:   45.60 ml  26.41 ml/m  AORTIC VALVE AV Area (Vmax):    2.49 cm AV Area (Vmean):   2.34 cm AV Area (VTI):     2.64 cm AV Vmax:           133.00 cm/s AV Vmean:          94.600 cm/s AV VTI:            0.237 m AV Peak Grad:      7.1 mmHg AV Mean Grad:      4.0 mmHg LVOT Vmax:         117.00 cm/s LVOT Vmean:        78.100 cm/s LVOT VTI:          0.221 m LVOT/AV VTI ratio: 0.93  AORTA Ao Root diam: 3.00 cm MITRAL VALVE                TRICUSPID VALVE MV Area (  PHT): 3.61 cm     TR Peak grad:   38.9 mmHg MV Decel Time: 210 msec     TR Vmax:        312.00 cm/s MV E velocity: 86.90 cm/s MV A velocity: 127.00 cm/s  SHUNTS MV E/A ratio:  0.68         Systemic VTI:  0.22 m                             Systemic Diam: 1.90 cm Yvonne Kendall MD Electronically signed by Yvonne Kendall MD Signature Date/Time: 02/04/2022/1:58:22 PM    Final    CT Angio Chest Pulmonary Embolism (PE) W or WO Contrast  Result Date: 02/04/2022 CLINICAL DATA:  Pulmonary embolism suspected, positive D-dimer, recent left hip fracture status post repair EXAM: CT ANGIOGRAPHY CHEST WITH CONTRAST TECHNIQUE: Multidetector CT imaging of the chest was performed using the standard protocol during bolus administration of intravenous contrast. Multiplanar CT image reconstructions and MIPs were obtained to evaluate the vascular anatomy. RADIATION DOSE REDUCTION: This exam was performed according to the departmental dose-optimization program which includes automated exposure control, adjustment of the mA and/or kV according to patient size and/or use of iterative reconstruction technique. CONTRAST:  50mL OMNIPAQUE IOHEXOL 350 MG/ML SOLN COMPARISON:  None Available. FINDINGS: Cardiovascular: Satisfactory opacification of the pulmonary  arteries to the segmental level. No evidence of pulmonary embolism. Mild cardiomegaly. Three-vessel coronary artery calcifications. No pericardial effusion. Aortic atherosclerosis. Mediastinum/Nodes: No enlarged mediastinal, hilar, or axillary lymph nodes. Thyroid gland, trachea, and esophagus demonstrate no significant findings. Lungs/Pleura: Trace bilateral pleural effusions and associated atelectasis or consolidation. 0.4 cm nodule of the peripheral right upper lobe (series 6, image 21). Upper Abdomen: No acute abnormality. Definitively benign, macroscopic fat containing bilateral adrenal adenomata, for which no further follow-up or characterization is required (series 5, image 299) Musculoskeletal: No chest wall abnormality. Age indeterminate inferior endplate wedge deformity of the T10 vertebral body (series 8, image 53) Review of the MIP images confirms the above findings. IMPRESSION: 1. Negative examination for pulmonary embolism. 2. Trace bilateral pleural effusions and associated atelectasis or consolidation. 3. Incidental note of a 0.4 cm nodule of the peripheral right upper lobe. No follow-up needed if patient is low-risk.This recommendation follows the consensus statement: Guidelines for Management of Incidental Pulmonary Nodules Detected on CT Images: From the Fleischner Society 2017; Radiology 2017; 284:228-243. 4. Age indeterminate inferior endplate wedge deformity of the T10 vertebral body. Correlate for acute pain and point tenderness. 5. Coronary artery disease. Aortic Atherosclerosis (ICD10-I70.0). Electronically Signed   By: Jearld Lesch M.D.   On: 02/04/2022 09:07    Assessment/Plan: 2 Days Post-Op   Principal Problem:   Closed intertrochanteric fracture of hip, left, initial encounter [facility]) Active Problems:   Controlled type 2 diabetes mellitus without complication, without long-term current use of insulin (HCC)   Hyperlipidemia   Chronic diastolic CHF (congestive heart failure)  (HCC)   Essential hypertension   Closed fracture of left proximal humerus   Atelectasis   Paroxysmal atrial fibrillation (HCC)  Will monitor for post-op bleeding now that patient is on heparin drip.  Recheck hemoglobin in AM.  Continue PT/OT.  Will reassess surgical dressings tomorrow.   Juanell Fairly , MD 02/04/2022, 3:18 PM

## 2022-02-04 NOTE — Progress Notes (Signed)
PT Cancellation Note  Patient Details Name: Laurie Patel MRN: [id number] DOB: 09-29-1927   Cancelled Treatment:     Pt not medically stable to tolerate skilled PT session this am. Spoke with nursing and will re-attempt in pm. Continue PT per POC.   Jannet Askew 02/04/2022, 9:51 AM

## 2022-02-04 NOTE — Plan of Care (Signed)

## 2022-02-04 NOTE — Consult Note (Addendum)
ANTICOAGULATION CONSULT NOTE - Initial Consult  Pharmacy Consult for heparin infusion Indication: atrial fibrillation  Allergies  Allergen Reactions   Augmentin [Amoxicillin-Pot Clavulanate] Nausea And Vomiting   Prednisone     Patient Measurements: Height: 5' 5.5" (166.4 cm) Weight: 65 kg (143 lb 4.8 oz) IBW/kg (Calculated) : 58.15 Heparin Dosing Weight: 65 kg  Vital Signs: Temp: 98.8 F (37.1 C) (08/14 0732) BP: 127/72 (08/14 0732) Pulse Rate: 96 (08/14 0732)  Labs: Recent Labs    02/02/22 0826 02/03/22 0307 02/04/22 0312 02/04/22 0617  HGB 14.0 10.8* 10.4*  --   HCT 44.4 33.7* 31.8*  --   PLT 203 200 205  --   CREATININE 0.76 0.75 0.82  --   TROPONINIHS  --   --   --  178*    Estimated Creatinine Clearance: 40.2 mL/min (by C-G formula based on SCr of 0.82 mg/dL).   Medical History: Past Medical History:  Diagnosis Date   Ankle fracture 10/14/2017   Chronic heart failure with preserved ejection fraction (HFpEF) (HCC)    Heart murmur    Hypertension    LBBB (left bundle branch block)    LVH (left ventricular hypertrophy)    a. 06/2016 Echo: EF 60-65%, no rwma, mild MR. PASP ; b. 01/2021 Echo: EF 55-60%, sev assym septal hypertrophy (LV basal-mid septum up to 1.5cm). No SAM. No rwma. GrIDD. Nl RV fxn. Mild Ao sclerosis.   Palpitations    a. 07/2016 Holter: Avg HR 77 (54-117). 59 isolated PVCs. Rare PACs (<1%) w/ 3 atrial runs - longest 7 beats, fastest 170 bpm x 4 beats - PAT.   PNA (pneumonia) 02/16/2018    Medications:  Scheduled:   aspirin EC  81 mg Oral Daily   cholecalciferol  1,000 Units Oral Q breakfast   docusate sodium  100 mg Oral BID   enoxaparin (LOVENOX) injection  40 mg Subcutaneous Q24H   furosemide  20 mg Oral Q breakfast   insulin aspart  0-5 Units Subcutaneous QHS   insulin aspart  0-9 Units Subcutaneous TID WC   metoprolol tartrate  12.5 mg Oral Q6H   multivitamin with minerals   Oral Q breakfast   senna  1 tablet Oral BID    traMADol  50 mg Oral Q6H   Infusions:   methocarbamol (ROBAXIN) IV     PRN: acetaminophen, acetaminophen, alum & mag hydroxide-simeth, bisacodyl, magnesium hydroxide, menthol-cetylpyridinium **OR** phenol, methocarbamol **OR** methocarbamol (ROBAXIN) IV, morphine injection, ondansetron **OR** ondansetron (ZOFRAN) IV, oxyCODONE, polyethylene glycol, traZODone  Assessment: [name] who presented to the emergency room with acute onset of accidental mechanical fall with subsequent left hip pain. Currently POD2 after intramedullary fixation for left intertrochanteric hip fracture. PMH significant for chronic HFpEF, LBBB, HTN complicated by whitecoat syndrome, and T2DM. 02/04/2022 ECG concerning for Afib with RVR and STEMI. 02/04/2022 D-Dimer 0.79, hsTrop 178. Plan to initiate anticoagulation with heparin infusion and transition to DOAC prior to discharge. Pharmacy has been consulted to initiate and manage heparin infusion.  Baseline Labs: Hgb 10.4, Hct 31.8, Plt 205, PT/INR and aPTT ordered    Goal of Therapy:  Heparin level 0.3-0.7 units/ml Monitor platelets by anticoagulation protocol: Yes   Plan:  Give 3250 units bolus x 1 Start heparin infusion at 950 units/hr Check anti-Xa level in 8 hours and daily while on heparin Continue to monitor H&H and platelets  Celene Squibb, PharmD PGY1 Pharmacy Resident 02/04/2022 8:01 AM

## 2022-02-04 NOTE — Assessment & Plan Note (Addendum)
New onset 8/14 postoperatively.  Cardiology were consulted, they recommended anticoagulation and started metoprolol. --The patient is currently in sinus rhythm on metoprolol. --Cardiology has stopped heparin and antithrombotics due to declining hemoglobin. The patient will be discharged with a monitor to determine if she is going back into atrial fibrillation and is at risk for stroke. She will not be discharged with anticoagulation out of concern for the patient's ongoing anemia.

## 2022-02-04 NOTE — Consult Note (Signed)
Cardiology Consultation:   Patient ID: Laurie Patel MRN: [id number]; DOB: [birth year]  Admit date: 02/01/2022 Date of Consult: 02/04/2022  PCP:  Laurie Haven, MD   [facility] Providers Cardiologist:  Laurie Sable, MD    Patient Profile:   Laurie Patel is a 88 y.o. female with a hx of chronic HFpEF, left bundle branch block, hypertension complicated by whitecoat syndrome, and type 2 diabetes mellitus, who is being seen 02/04/2022 for the evaluation of abnormal EKG concerning for STEMI at the request of Laurie Glass, NP.  History of Present Illness:   Laurie Patel has been followed for chronic HFpEF by Dr. Garen Patel, having last been seen in April.  She had presented to the ED in March with chest discomfort, shortness of breath, and weight gain.  He was started on furosemide with significant improvement in her symptoms.  She had been doing well until 3 days ago (02/01/2022), when she lost her balance and fell at her home.  She suffered a left femoral intertrochanteric fracture and underwent internal fixation with Dr. Mack Patel 2 days ago.  This morning, she was noted to suddenly be tachycardic.  Subsequent EKG was concerning for STEMI.  She was also acutely hypoxic, with reported oxygen saturation in the 80s (not recorded on recent vital signs).  This resolved with supplemental oxygen via nasal cannula.  Laurie Patel feels well, denying chest pain, shortness of breath, and palpitations.  Hip pain is minimal.  She has not had any significant leg swelling.  She did not have any heart symptoms leading up to her recent fall and hip fracture.  She wonders if a bad dream overnight may have contributed to her tachycardia.   Past Medical History:  Diagnosis Date   Ankle fracture 10/14/2017   Chronic heart failure with preserved ejection fraction (HFpEF) (HCC)    Heart murmur    Hypertension    LBBB (left bundle branch block)    LVH (left ventricular hypertrophy)    a. 06/2016 Echo: EF  60-65%, no rwma, mild MR. PASP 25mmHg; b. 01/2021 Echo: EF 55-60%, sev assym septal hypertrophy (LV basal-mid septum up to 1.5cm). No SAM. No rwma. GrIDD. Nl RV fxn. Mild Ao sclerosis.   Palpitations    a. 07/2016 Holter: Avg HR 77 (54-117). 10 isolated PVCs. Rare PACs (<1%) w/ 3 atrial runs - longest 7 beats, fastest 170 bpm x 4 beats - PAT.   PNA (pneumonia) 02/16/2018    Past Surgical History:  Procedure Laterality Date   APPENDECTOMY  1986   EYE SURGERY Bilateral 1995 and 1999   Lens implants   WRIST SURGERY Left 1986   ORIF      Inpatient Medications: Scheduled Meds:  aspirin EC  81 mg Oral Daily   cholecalciferol  1,000 Units Oral Q breakfast   docusate sodium  100 mg Oral BID   enoxaparin (LOVENOX) injection  40 mg Subcutaneous Q24H   furosemide  20 mg Oral Q breakfast   insulin aspart  0-5 Units Subcutaneous QHS   insulin aspart  0-9 Units Subcutaneous TID WC   lisinopril  10 mg Oral Daily   multivitamin with minerals   Oral Q breakfast   senna  1 tablet Oral BID   traMADol  50 mg Oral Q6H   Continuous Infusions:  methocarbamol (ROBAXIN) IV     PRN Meds: acetaminophen, acetaminophen, alum & mag hydroxide-simeth, bisacodyl, magnesium hydroxide, menthol-cetylpyridinium **OR** phenol, methocarbamol **OR** methocarbamol (ROBAXIN) IV, morphine injection, ondansetron **OR** ondansetron (ZOFRAN) IV,  oxyCODONE, polyethylene glycol, traZODone  Allergies:    Allergies  Allergen Reactions   Augmentin [Amoxicillin-Pot Clavulanate] Nausea And Vomiting   Prednisone     Social History:   Social History   Tobacco Use   Smoking status: Never   Smokeless tobacco: Never  Vaping Use   Vaping Use: Never used  Substance Use Topics   Alcohol use: No    Alcohol/week: 0.0 standard drinks of alcohol   Drug use: No      Family History:   Family History  Problem Relation Age of Onset   Cancer Mother 62       Breast Cancer   Heart disease Father 68       Congestive Heart  Failure   COPD Father 9       Emphysema   Heart disease Brother 73       massive heart attack   Heart disease Maternal Grandmother    Heart disease Maternal Grandfather    Early death Paternal Grandmother        Early death unsure age   Heart disease Paternal Grandfather    Obesity Paternal Grandfather      ROS:  Please see the history of present illness. All other ROS reviewed and negative.     Physical Exam/Data:   Vitals:   02/03/22 1559 02/03/22 2042 02/04/22 0532 02/04/22 0615  BP: (!) 131/49 (!) 128/53 116/62 135/60  Pulse: 96 96 (!) 129 (!) 130  Resp:  16 18 18   Temp: 98.2 F (36.8 C) 99 F (37.2 C) 98.4 F (36.9 C) 99.2 F (37.3 C)  TempSrc:      SpO2: 93% 93% 92% 97%  Weight:      Height:        Intake/Output Summary (Last 24 hours) at 02/04/2022 [id number] Last data filed at 02/03/2022 1855 Gross per 24 hour  Intake 360 ml  Output --  Net 360 ml      02/01/2022    6:19 PM 01/15/2022    8:52 AM 10/11/2021   11:13 AM  Last 3 Weights  Weight (lbs) 143 lb 4.8 oz 144 lb 9.6 oz 146 lb  Weight (kg) 65 kg 65.59 kg 66.225 kg     Body mass index is 23.48 kg/m.  General:  Well nourished, well developed, in no acute distress HEENT: normal Neck: no JVD Vascular: No carotid bruits; Distal pulses 2+ bilaterally Cardiac: Tachycardic and irregularly irregular without murmurs. Lungs: Coarse breath sounds bilaterally.  Rhonchi or rales  Abd: soft, nontender, no hepatomegaly  Ext: no edema Musculoskeletal:  No deformities, BUE and BLE strength normal and equal Skin: warm and dry  Neuro:  CNs 2-12 intact, no focal abnormalities noted Psych:  Normal affect   EKG:  The EKG was personally reviewed and demonstrates: Atrial fibrillation with left bundle branch block.  Compared to prior tracing from 02/01/2022, atrial fibrillation is new  Relevant CV Studies: TTE (01/30/2022):  1. Severe assymetric septal hypertrophy (LV Basal-mid septum measuring  upto 1.5cm). no SAM noted.  consider CMR (outpatient) for HCM evaluation if  clinically indicated. . Left ventricular ejection fraction, by estimation,  is 55 to 60%. The left ventricle   has normal function. The left ventricle has no regional wall motion  abnormalities. There is severe asymmetric left ventricular hypertrophy of  the septal segment. Left ventricular diastolic parameters are consistent  with Grade I diastolic dysfunction  (impaired relaxation).   2. Right ventricular systolic function is normal. The right ventricular  size is normal. There is normal pulmonary artery systolic pressure.   3. The mitral valve is degenerative. No evidence of mitral valve  regurgitation.   4. The aortic valve is tricuspid. Aortic valve regurgitation is not  visualized. Mild aortic valve sclerosis is present, with no evidence of  aortic valve stenosis.   5. The inferior vena cava is normal in size with <50% respiratory  variability, suggesting right atrial pressure of 8 mmHg.  Laboratory Data:  High Sensitivity Troponin:  No results for input(s): "TROPONINIHS" in the last 720 hours.   Chemistry Recent Labs  Lab 02/02/22 0826 02/03/22 0307 02/04/22 0312  NA 138 138 136  K 4.2 4.1 4.0  CL 104 103 99  CO2 26 30 29   GLUCOSE 188* 182* 210*  BUN 19 21 29*  CREATININE 0.76 0.75 0.82  CALCIUM 9.0 9.0 9.3  GFRNONAA >60 >60 >60  ANIONGAP 8 5 8     No results for input(s): "PROT", "ALBUMIN", "AST", "ALT", "ALKPHOS", "BILITOT" in the last 168 hours. Lipids No results for input(s): "CHOL", "TRIG", "HDL", "LABVLDL", "LDLCALC", "CHOLHDL" in the last 168 hours.  Hematology Recent Labs  Lab 02/02/22 0826 02/03/22 0307 02/04/22 0312  WBC 15.7* 17.7* 18.5*  RBC 5.23* 3.98 3.76*  HGB 14.0 10.8* 10.4*  HCT 44.4 33.7* 31.8*  MCV 84.9 84.7 84.6  MCH 26.8 27.1 27.7  MCHC 31.5 32.0 32.7  RDW 13.6 13.4 13.6  PLT 203 200 205   Thyroid No results for input(s): "TSH", "FREET4" in the last 168 hours.  BNPNo results for  input(s): "BNP", "PROBNP" in the last 168 hours.  DDimer  Recent Labs  Lab 02/04/22 0614  DDIMER 0.79*     Radiology/Studies:  DG HIP UNILAT WITH PELVIS 2-3 VIEWS LEFT  Result Date: 02/02/2022 CLINICAL DATA:  Fluoroscopic assistance for internal fixation of fracture of left femur EXAM: DG HIP (WITH OR WITHOUT PELVIS) 2-3V LEFT COMPARISON:  02/01/2022 FINDINGS: Fluoroscopic images show reduction and internal fixation of comminuted intertrochanteric fracture of left femur with intramedullary rod. Fluoroscopy time 40 seconds. Radiation dose 4.018 mGy. IMPRESSION: Fluoroscopic assistance was provided for internal fixation of intertrochanteric fracture of left femur. Electronically Signed   By: Elmer Picker M.D.   On: 02/02/2022 13:35   DG Hip Port Unilat With Pelvis 1V Left  Result Date: 02/02/2022 CLINICAL DATA:  Internal fixation of fracture of left femur EXAM: DG HIP (WITH OR WITHOUT PELVIS) 1V PORT LEFT COMPARISON:  02/01/2022 FINDINGS: There is internal fixation of comminuted intertrochanteric fracture of proximal left femur with intramedullary rod. There are pockets of air in the soft tissues from recent surgery. There are coarse calcifications in the soft tissues medial to the proximal femur and adjacent to the left ischium. This may be residual from previous soft tissue injury. IMPRESSION: Reduction and internal fixation of comminuted intertrochanteric fracture of proximal left femur. Electronically Signed   By: Elmer Picker M.D.   On: 02/02/2022 13:34   DG C-Arm 1-60 Min-No Report  Result Date: 02/02/2022 Fluoroscopy was utilized by the requesting physician.  No radiographic interpretation.   DG Chest 1 View  Result Date: 02/01/2022 CLINICAL DATA:  Trauma, fall EXAM: CHEST  1 VIEW COMPARISON:  09/08/2021 FINDINGS: Transverse diameter of heart is increased. There are no signs of pulmonary edema or focal pulmonary consolidation. There is no pleural effusion or pneumothorax.  There is mild deformity in the posterolateral aspect of left fourth rib, possibly suggesting old fracture. Osteopenia is seen in bony structures. IMPRESSION: Cardiomegaly.  There are no signs of pulmonary edema or focal pulmonary consolidation. Electronically Signed   By: Ernie Avena M.D.   On: 02/01/2022 19:16   DG Shoulder Left  Result Date: 02/01/2022 CLINICAL DATA:  Trauma, fall, pain EXAM: LEFT SHOULDER - 2+ VIEW COMPARISON:  None Available. FINDINGS: There is cortical irregularity along the medial margin of neck cough proximal left humerus. There is faint sclerosis in the neck of the left femur. Degenerative changes are noted in left AC joint. Osteopenia is seen in bony structures. Degenerative changes noted in cervical spine. IMPRESSION: Deformity in the neck of the left humerus suggests recent impacted fracture. Less likely possibility would be residual change from previous injury. Electronically Signed   By: Ernie Avena M.D.   On: 02/01/2022 19:14   DG Hip Unilat W or Wo Pelvis 2-3 Views Left  Result Date: 02/01/2022 CLINICAL DATA:  Trauma, fall EXAM: DG HIP (WITH OR WITHOUT PELVIS) 2-3V LEFT COMPARISON:  None Available. FINDINGS: There is comminuted intertrochanteric fracture of proximal left femur. There is slight overriding of fracture fragments in the medial aspect. There is slight medial displacement of lesser trochanter. Osteopenia is seen in bony structures. Degenerative changes are noted in lower lumbar spine. There are soft tissue calcifications adjacent to the ischium. IMPRESSION: Comminuted intertrochanteric fracture is seen in the proximal left femur. Electronically Signed   By: Ernie Avena M.D.   On: 02/01/2022 19:12     Assessment and Plan:   Atrial fibrillation with rapid ventricular response: Sudden onset of tachycardia today is consistent with EKG demonstrating atrial fibrillation with rapid ventricular response.  In the setting of recent hip fracture  requiring surgical intervention, pulmonary embolism is a consideration (D-dimer also noted to be mildly elevated). -Initiate metoprolol tartrate 12.5 mg every 6 hours, with titration as tolerated to achieve resting ventricular rate below 110 bpm. -Initiate heparin infusion if okay from an orthopedic standpoint with plans to transition to DOAC prior to discharge. -Recommend CTA chest to evaluate for pulmonary embolism.  Left bundle branch block: This is a chronic finding.  EKG today shows persistent left bundle branch block.  Given that this is an old finding and the patient is not having any concerning symptoms such as angina or dyspnea, I do not believe this represents ACS.  Troponin is pending.  Unless Ms. Mestre becomes unstable or develops symptoms consistent with myocardial ischemia, I would not recommend cardiac catheterization at this time.  Chronic HFpEF: Ms. Holsworth appears euvolemic.  Hypoxia could be due to a number of factors, including potential pulmonary edema that may have been brought about by onset of atrial fibrillation with rapid ventricular response in the setting of HFpEF. -Follow-up CTA chest, which will allow [place] to evaluate for findings consistent with heart failure. -Check BNP. -Maintain net even to slightly negative fluid balance.  Okay to continue low-dose furosemide for now though escalation may be needed. -Consider repeat echo once ventricular rate control has improved.  Hypertension: Blood pressure somewhat labile.  Patient currently on lisinopril and furosemide. -Hold lisinopril to allow for more blood pressure room with titration of rate controlling agents such as metoprolol +/- diltiazem.  Hip fracture: -Per orthopedics and internal medicine.  Risk Assessment/Risk Scores:      New York Heart Association (NYHA) Functional Class NYHA Class I  CHA2DS2-VASc Score = 6  This indicates a 9.7% annual risk of stroke. The patient's score is based upon: CHF History:  1 HTN History: 1 Diabetes History: 1 Stroke History: 0  Vascular Disease History: 0 Age Score: 2 Gender Score: 1   For questions or updates, please contact [facility] Please consult [email or web address] for contact info under [facility].  Signed, Yvonne Kendall, MD  02/04/2022 7:02 AM

## 2022-02-04 NOTE — Progress Notes (Signed)
Nutrition Follow-up  DOCUMENTATION CODES:   Not applicable  INTERVENTION:   -Ensure Enlive po BID, each supplement provides 350 kcal and 20 grams of protein -Magic cup BID with meals, each supplement provides 290 kcal and 9 grams of protein  -MVI with minerals daily  NUTRITION DIAGNOSIS:   Increased nutrient needs related to post-op healing, hip fracture as evidenced by estimated needs.  Ongoing  GOAL:   Patient will meet greater than or equal to 90% of their needs  Progressing   MONITOR:   PO intake, Supplement acceptance, Labs, Skin, Weight trends  REASON FOR ASSESSMENT:   Consult Assessment of nutrition requirement/status, Hip fracture protocol  ASSESSMENT:   Pt admitted from home after a fall leading to L hip fracture. PMH significant for HTN, DM, dCHF, and LBBB.  8/12- s/p Intramedullary fixation for left intertrochanteric hip fracture  Reviewed I/O's: +360 ml x 24 hours and -40 ml since admission   Spoke with pt at bedside, who complains of not sleeping well last night due to having a bad dream. She also reports she does not feel like eating, as the oxygen is causing her to have mucous leak down into her throat and stomach, causing stomach pain and a sore throat. Pt just ate a cup of ice cream, which helped.   PTA pt reports good appetite, consuming 2-3 meals per day. Pt denies any weight loss.Reviewed wt hx; wt has been stable over the past 4 months.  Discussed importance of good meal and supplement intake to promote healing. Pt likes Ensure and also drinks at home.   Medications reviewed and include vitamin D3, colace, and senokot.   Labs reviewed: CBGS: 191 (inpatient orders for glycemic control are 0-5 units insulin aspart daily at bedtime and 0-9 units insulin aspart TID with meals).    NUTRITION - FOCUSED PHYSICAL EXAM:  Flowsheet Row Most Recent Value  Orbital Region No depletion  Upper Arm Region Mild depletion  Thoracic and Lumbar Region No  depletion  Buccal Region No depletion  Temple Region No depletion  Clavicle Bone Region No depletion  Clavicle and Acromion Bone Region No depletion  Scapular Bone Region No depletion  Dorsal Hand No depletion  Patellar Region No depletion  Anterior Thigh Region No depletion  Posterior Calf Region No depletion  Edema (RD Assessment) None  Hair Reviewed  Jenne Reviewed  Mouth Reviewed  Skin Reviewed  Nails Reviewed       Diet Order:   Diet Order             Diet regular Room service appropriate? Yes; Fluid consistency: Thin  Diet effective now                   EDUCATION NEEDS:   Education needs have been addressed  Skin:  Skin Assessment: Skin Integrity Issues: Skin Integrity Issues:: Incisions Incisions: L hip (closed)  Last BM:  02/01/22  Height:   Ht Readings from Last 1 Encounters:  02/01/22 5' 5.5" (1.664 m)   Weight:   Wt Readings from Last 1 Encounters:  02/01/22 65 kg   BMI:  Body mass index is 23.48 kg/m.  Estimated Nutritional Needs:   Kcal:  1600-1800  Protein:  85-100 grams  Fluid:  > 1.6 L    Levada Schilling, RD, LDN, CDCES Registered Dietitian II Certified Diabetes Care and Education Specialist Please refer to [facility] for RD and/or RD on-call/weekend/after hours pager

## 2022-02-04 NOTE — Plan of Care (Signed)

## 2022-02-04 NOTE — Progress Notes (Signed)
  Progress Note   Patient: Laurie Patel NID:[id number] DOB: [birth year] DOA: 02/01/2022     3 DOS: the patient was seen and examined on 02/04/2022 at 10:09AM      Brief hospital course: Laurie Patel is a 88 y.o. F with HTN, DM, dCHF, and hx LBBB who presented with a mechanical fall and left hip pain.  In the ER, radiograph showed left hip fracture.  Ortho consulted.     Assessment and Plan: * Closed intertrochanteric fracture of hip, left, initial encounter (HCC) S/p Intramedullary fixation for left intertrochanteric hip fracturby Dr. Martha Clan.  Vit D 52. - PT eval    Essential hypertension - Stop lisinopril - Continue furosemide - Continue new metoprolol for Afib  Chronic diastolic CHF (congestive heart failure) (HCC) Appears euvolemic - Continue Lasix  Paroxysmal atrial fibrillation (HCC) New onset 8/14 postoperatively.  Cardiology were consulted, they recommended anticoagulation and started metoprolol. - Continue heparin drip for now - Trend troponins - Check TSH - Obtain echocardiogram - Continue metoprolol  Atelectasis Hypoxia noted here.  This is from atelectasis and hypopnea from opiates.  CTA chest ruled out PE, pneumonia, edema. - IS, mobilization - Wean O2  Closed fracture of left proximal humerus - Nonweightbearing to left upper extremity - Maintain left arm in sling -Outpatient orthopedics follow-up  Hyperlipidemia Not on a statin, presumably due to age  Controlled type 2 diabetes mellitus without complication, without long-term current use of insulin (HCC) This is diet controlled.  She had mild hyperglycemia here.  Last hemoglobin A1c was 7.9%.   - Continue SS corrections          Subjective: Denies chest pain, dyspnea.  Says she feels "fine", just wants to take a nap.  Pain is well controlled.  No fever.     Physical Exam: Vitals:   02/04/22 0532 02/04/22 0615 02/04/22 0732 02/04/22 0949  BP: 116/62 135/60 127/72 131/60  Pulse: (!)  129 (!) 130 96 99  Resp: 18 18 16 18   Temp: 98.4 F (36.9 C) 99.2 F (37.3 C) 98.8 F (37.1 C) 98 F (36.7 C)  TempSrc:      SpO2: 92% 97% 97% 97%  Weight:      Height:       Elderly adult female, lying in bed, sleeping, easily arousable and is oriented RRR, no murmurs, no peripheral edema, no JVD Respiratory rate seems normal, lung sounds overall diminished but no rales or wheezes appreciated Abdomen G-tube in distended, similar to her baseline, no tenderness to palpation or guarding Left-sided arm range movement limited, left leg range movement limited by pain Attention normal, affect appropriate, judgment normal Face symmetric, speech fluent    Data Reviewed: Case discussed with orthopedics and cardiology CT angiogram report reviewed D-dimer elevated BNP slightly up Troponin 178, pending repeat White blood cell count slightly up to 18 K, hemoglobin 10, stable from yesterday, metabolic panel unremarkable  Family Communication: None    Disposition: Status is: Inpatient Patient admitted with hip fracture.    Post-op course coplicated by new Afib.  Cardiology recommend heparin IV and trend troponin.  If further testing reassuing, may transition to Eliquis tomorrow with d/c to home by Weds.        Author: , MD 02/04/2022 10:18 AM  For on call review [email or web address].

## 2022-02-04 NOTE — Progress Notes (Signed)
Date and time results received: 02/04/22   (use smartphrase ".now" to insert current time)  Test: Troponin Critical Value: 178  Name of Provider Notified: Danford  Orders Received? Or Actions Taken?:  Monitor for new chest symptoms

## 2022-02-04 NOTE — Progress Notes (Addendum)
       CROSS COVER NOTE  NAME: Laurie Patel MRN: [id number] DOB : 11-30-1927    Date of Service   02/04/22  HPI/Events of Note   Notified by nursing that [name] HR is 120s-130s and irregular. She has also been hypoxic with desaturations into the mid 80s on room air.   On bedside evaluation [name] denies chest pain dyspnea, fatigue, palpitations, dizziness, abdominal pain, or vomiting. She does endorse nausea earlier tonight.   Interventions   Plan: EKG--> Acute MI, AFIB w RVR (New onset), LBBB HR 132       - Cardiology paged, Dr End evaluated patient at bedside       - 5 mg IV metoprolol D-Dimer -->0.79       - CTA Chest ordered Troponin -->178 2L Supplemental O2 PRN    This document was prepared using Dragon voice recognition software and may include unintentional dictation errors.  Bishop Limbo DNP, MHA, FNP-BC Nurse Practitioner [facility] Pager [phone]

## 2022-02-04 NOTE — Consult Note (Signed)
ANTICOAGULATION CONSULT NOTE - Initial Consult  Pharmacy Consult for heparin infusion Indication: atrial fibrillation  Allergies  Allergen Reactions   Augmentin [Amoxicillin-Pot Clavulanate] Nausea And Vomiting   Prednisone     Patient Measurements: Height: 5' 5.5" (166.4 cm) Weight: 65 kg (143 lb 4.8 oz) IBW/kg (Calculated) : 58.15 Heparin Dosing Weight: 65 kg  Vital Signs: Temp: 97.9 F (36.6 C) (08/14 1733) Temp Source: Oral (08/14 1342) BP: 148/59 (08/14 1733) Pulse Rate: 83 (08/14 1733)  Labs: Recent Labs    02/02/22 0826 02/03/22 0307 02/04/22 0312 02/04/22 0614 02/04/22 0617 02/04/22 1243 02/04/22 1750  HGB 14.0 10.8* 10.4*  --   --   --   --   HCT 44.4 33.7* 31.8*  --   --   --   --   PLT 203 200 205  --   --   --   --   APTT  --   --   --  33  --   --   --   LABPROT  --   --   --  14.9  --   --   --   INR  --   --   --  1.2  --   --   --   HEPARINUNFRC  --   --   --   --   --   --  0.37  CREATININE 0.76 0.75 0.82  --   --   --   --   TROPONINIHS  --   --   --   --  178* 174*  --      Estimated Creatinine Clearance: 40.2 mL/min (by C-G formula based on SCr of 0.82 mg/dL).   Medical History: Past Medical History:  Diagnosis Date   Ankle fracture 10/14/2017   Chronic heart failure with preserved ejection fraction (HFpEF) (HCC)    Heart murmur    Hypertension    LBBB (left bundle branch block)    LVH (left ventricular hypertrophy)    a. 06/2016 Echo: EF 60-65%, no rwma, mild MR. PASP ; b. 01/2021 Echo: EF 55-60%, sev assym septal hypertrophy (LV basal-mid septum up to 1.5cm). No SAM. No rwma. GrIDD. Nl RV fxn. Mild Ao sclerosis.   Palpitations    a. 07/2016 Holter: Avg HR 77 (54-117). 59 isolated PVCs. Rare PACs (<1%) w/ 3 atrial runs - longest 7 beats, fastest 170 bpm x 4 beats - PAT.   PNA (pneumonia) 02/16/2018    Medications:  Scheduled:   cholecalciferol  1,000 Units Oral Q breakfast   docusate sodium  100 mg Oral BID   feeding  supplement  237 mL Oral BID BM   furosemide  20 mg Oral Q breakfast   insulin aspart  0-5 Units Subcutaneous QHS   insulin aspart  0-9 Units Subcutaneous TID WC   metoprolol tartrate  12.5 mg Oral Q6H   multivitamin with minerals   Oral Q breakfast   senna  1 tablet Oral BID   traMADol  50 mg Oral Q6H   Infusions:   heparin 950 Units/hr (02/04/22 0914)   methocarbamol (ROBAXIN) IV     PRN: acetaminophen, acetaminophen, alum & mag hydroxide-simeth, bisacodyl, magnesium hydroxide, menthol-cetylpyridinium **OR** phenol, methocarbamol **OR** methocarbamol (ROBAXIN) IV, morphine injection, ondansetron **OR** ondansetron (ZOFRAN) IV, oxyCODONE, polyethylene glycol, traZODone Heparin Dosing Weight: 65 kg  Assessment: [name] who presented to the emergency room with acute onset of accidental mechanical fall with subsequent left hip pain. Currently  POD2 after intramedullary fixation for left intertrochanteric hip fracture. PMH significant for chronic HFpEF, LBBB, HTN complicated by whitecoat syndrome, and T2DM. 02/04/2022 ECG concerning for Afib with RVR and STEMI. 02/04/2022 D-Dimer 0.79, hsTrop 178. Pharmacy has been consulted to initiate and manage heparin infusion.  Baseline Labs: Hgb 10.4, Hct 31.8, Plt 205, PT/INR and aPTT ordered    Date Time  HL Rate/Comment 8/14 1750 0.37 Thera x1; 950 un/hr      Goal of Therapy:  Heparin level 0.3-0.7 units/ml Monitor platelets by anticoagulation protocol: Yes   Plan:  HL therapeutic at 0.37 at LLN goal range.  Continue heparin infusion at 950 units/hr Will recheck HL in 8 hours to confirm rate; then daily once consecutively therapeutic Continue to monitor H&H and platelets daily with AM labs while on heparin gtt  TOC - Plan to initiate anticoagulation with heparin infusion and transition to DOAC prior to discharge.  Martyn Malay, PharmD, [facility] Clinical Pharmacist 02/04/2022 6:23 PM

## 2022-02-05 ENCOUNTER — Other Ambulatory Visit (HOSPITAL_COMMUNITY): Payer: Self-pay

## 2022-02-05 ENCOUNTER — Telehealth (HOSPITAL_COMMUNITY): Payer: Self-pay | Admitting: Pharmacy Technician

## 2022-02-05 DIAGNOSIS — S42202A Unspecified fracture of upper end of left humerus, initial encounter for closed fracture: Secondary | ICD-10-CM | POA: Diagnosis not present

## 2022-02-05 DIAGNOSIS — E119 Type 2 diabetes mellitus without complications: Secondary | ICD-10-CM | POA: Diagnosis not present

## 2022-02-05 DIAGNOSIS — S72142A Displaced intertrochanteric fracture of left femur, initial encounter for closed fracture: Secondary | ICD-10-CM | POA: Diagnosis not present

## 2022-02-05 DIAGNOSIS — I10 Essential (primary) hypertension: Secondary | ICD-10-CM | POA: Diagnosis not present

## 2022-02-05 DIAGNOSIS — I5032 Chronic diastolic (congestive) heart failure: Secondary | ICD-10-CM | POA: Diagnosis not present

## 2022-02-05 DIAGNOSIS — E782 Mixed hyperlipidemia: Secondary | ICD-10-CM

## 2022-02-05 LAB — CBC
HCT: 31.2 % — ABNORMAL LOW (ref 36.0–46.0)
Hemoglobin: 10.2 g/dL — ABNORMAL LOW (ref 12.0–15.0)
MCH: 27.3 pg (ref 26.0–34.0)
MCHC: 32.7 g/dL (ref 30.0–36.0)
MCV: 83.6 fL (ref 80.0–100.0)
Platelets: 232 10*3/uL (ref 150–400)
RBC: 3.73 MIL/uL — ABNORMAL LOW (ref 3.87–5.11)
RDW: 13.6 % (ref 11.5–15.5)
WBC: 20.2 10*3/uL — ABNORMAL HIGH (ref 4.0–10.5)
nRBC: 0 % (ref 0.0–0.2)

## 2022-02-05 LAB — TSH: TSH: 1.308 u[IU]/mL (ref 0.350–4.500)

## 2022-02-05 LAB — HEPARIN LEVEL (UNFRACTIONATED): Heparin Unfractionated: 0.31 IU/mL (ref 0.30–0.70)

## 2022-02-05 LAB — BASIC METABOLIC PANEL
Anion gap: 8 (ref 5–15)
BUN: 33 mg/dL — ABNORMAL HIGH (ref 8–23)
CO2: 28 mmol/L (ref 22–32)
Calcium: 9.2 mg/dL (ref 8.9–10.3)
Chloride: 99 mmol/L (ref 98–111)
Creatinine, Ser: 0.77 mg/dL (ref 0.44–1.00)
GFR, Estimated: >60 mL/min (ref >60)
Glucose, Bld: 219 mg/dL — ABNORMAL HIGH (ref 70–99)
Potassium: 3.8 mmol/L (ref 3.5–5.1)
Sodium: 135 mmol/L (ref 135–145)

## 2022-02-05 LAB — GLUCOSE, CAPILLARY
Glucose-Capillary: 211 mg/dL — ABNORMAL HIGH (ref 70–99)
Glucose-Capillary: 248 mg/dL — ABNORMAL HIGH (ref 70–99)
Glucose-Capillary: 287 mg/dL — ABNORMAL HIGH (ref 70–99)
Glucose-Capillary: 152 mg/dL — ABNORMAL HIGH (ref 70–99)

## 2022-02-05 MED ORDER — LISINOPRIL 10 MG PO TABS
10.0000 mg | ORAL_TABLET | Freq: Every day | ORAL | Status: DC
  Administered 2022-02-05 – 2022-02-08 (×4): 10 mg via ORAL
  Filled 2022-02-05 (×4): qty 1

## 2022-02-05 MED ORDER — METOPROLOL TARTRATE 25 MG PO TABS
12.5000 mg | ORAL_TABLET | Freq: Once | ORAL | Status: AC
  Administered 2022-02-05: 12.5 mg via ORAL
  Filled 2022-02-05: qty 1

## 2022-02-05 MED ORDER — METOPROLOL TARTRATE 25 MG PO TABS
25.0000 mg | ORAL_TABLET | Freq: Two times a day (BID) | ORAL | Status: DC
  Administered 2022-02-05 – 2022-02-07 (×3): 25 mg via ORAL
  Filled 2022-02-05 (×4): qty 1

## 2022-02-05 NOTE — Inpatient Diabetes Management (Signed)
Inpatient Diabetes Program Recommendations  AACE/ADA: New Consensus Statement on Inpatient Glycemic Control (2015)  Target Ranges:  Prepandial:   less than 140 mg/dL      Peak postprandial:   less than 180 mg/dL (1-2 hours)      Critically ill patients:  140 - 180 mg/dL    Latest Reference Range & Units 02/04/22 07:55 02/04/22 11:30 02/04/22 17:46 02/04/22 21:26  Glucose-Capillary 70 - 99 mg/dL 185 (H)  2 units Novolog @0904  207 (H)  3 units Novolog @1344  208 (H)  3 units Novolog  184 (H)  (H): Data is abnormally high  Latest Reference Range & Units 02/05/22 07:45 02/05/22 12:50  Glucose-Capillary 70 - 99 mg/dL 02/07/22 (H)  3 units Novolog  248 (H)  3 units Novolog   (H): Data is abnormally high     Home DM Meds: None--Diet Controlled  Current Orders: Novolog Sensitive Correction Scale/ SSI (0-9 units) TID AC + HS    MD- Note pt getting PO diet + Ensure PO supps BID.  May consider:  1. Change diet to Carbohydrate Modified  2. Start very low dose Novolog Meal Coverage:  Novolog 2 units TID with meals HOLD it pt eats <50% meals    --Will follow patient during hospitalization--  02/07/22 RN, MSN, CDCES Diabetes Coordinator Inpatient Glycemic Control Team Team Pager: [phone] (8a-5p)

## 2022-02-05 NOTE — TOC Benefit Eligibility Note (Addendum)
Patient Product/process development scientist completed.    The patient is currently admitted and upon discharge could be taking Eliquis 5 mg.  The current 30 day co-pay is $47.00.   The patient is currently admitted and upon discharge could be taking Xarelto 20mg .  The current 30 day co-pay is $47.00.   The patient is currently admitted and upon discharge could be taking warfarin (Coumadin) 5 mg.  The current 30 day co-pay is $0.00.   The patient is insured through Part D    [employer], CPhT Pharmacy Patient Advocate Specialist [facility] Patient Advocate Team Direct Number: [phone]  Fax: [phone]

## 2022-02-05 NOTE — Progress Notes (Signed)
Progress Note  Patient Name: Laurie Patel Date of Encounter: 02/05/2022  [facility] Cardiologist: Debbe OdeaBrian Agbor-Etang, MD   Subjective   Sitting up eating breakfast this morning, Reports food is poor, cold No other complaints Review of telemetry confirms conversion back to normal sinus rhythm yesterday morning   Inpatient Medications    Scheduled Meds:  cholecalciferol  1,000 Units Oral Q breakfast   docusate sodium  100 mg Oral BID   feeding supplement  237 mL Oral BID BM   furosemide  20 mg Oral Q breakfast   insulin aspart  0-5 Units Subcutaneous QHS   insulin aspart  0-9 Units Subcutaneous TID WC   metoprolol tartrate  12.5 mg Oral Once   metoprolol tartrate  25 mg Oral BID   multivitamin with minerals   Oral Q breakfast   senna  1 tablet Oral BID   traMADol  50 mg Oral Q6H   Continuous Infusions:  heparin 950 Units/hr (02/05/22 0525)   methocarbamol (ROBAXIN) IV     PRN Meds: acetaminophen, acetaminophen, alum & mag hydroxide-simeth, bisacodyl, magnesium hydroxide, menthol-cetylpyridinium **OR** phenol, methocarbamol **OR** methocarbamol (ROBAXIN) IV, morphine injection, ondansetron **OR** ondansetron (ZOFRAN) IV, oxyCODONE, polyethylene glycol, traZODone   Vital Signs    Vitals:   02/04/22 1938 02/05/22 0300 02/05/22 0454 02/05/22 0752  BP: (!) 138/56 (!) 103/39 (!) 154/54 (!) 154/62  Pulse: 70 71 81 86  Resp: 20 18 20 16   Temp: 98.5 F (36.9 C) 99.1 F (37.3 C) 98.9 F (37.2 C) 98.6 F (37 C)  TempSrc:  Oral    SpO2: 97% 99% 99% 95%  Weight:      Height:        Intake/Output Summary (Last 24 hours) at 02/05/2022 0954 Last data filed at 02/05/2022 0226 Gross per 24 hour  Intake --  Output 501 ml  Net -501 ml      02/01/2022    6:19 PM 01/15/2022    8:52 AM 10/11/2021   11:13 AM  Last 3 Weights  Weight (lbs) 143 lb 4.8 oz 144 lb 9.6 oz 146 lb  Weight (kg) 65 kg 65.59 kg 66.225 kg      Telemetry    Normal sinus rhythm- Personally  Reviewed  ECG     - Personally Reviewed  Physical Exam   GEN: No acute distress.   Neck: No JVD Cardiac: RRR, no murmurs, rubs, or gallops.  Respiratory: Clear to auscultation bilaterally. GI: Soft, nontender, non-distended  MS: No edema; No deformity. Neuro:  Nonfocal  Psych: Normal affect   Labs    High Sensitivity Troponin:   Recent Labs  Lab 02/04/22 0617 02/04/22 1243  TROPONINIHS 178* 174*     Chemistry Recent Labs  Lab 02/03/22 0307 02/04/22 0312 02/05/22 0200  NA 138 136 135  K 4.1 4.0 3.8  CL 103 99 99  CO2 30 29 28   GLUCOSE 182* 210* 219*  BUN 21 29* 33*  CREATININE 0.75 0.82 0.77  CALCIUM 9.0 9.3 9.2  GFRNONAA >60 >60 >60  ANIONGAP 5 8 8     Lipids No results for input(s): "CHOL", "TRIG", "HDL", "LABVLDL", "LDLCALC", "CHOLHDL" in the last 168 hours.  Hematology Recent Labs  Lab 02/03/22 0307 02/04/22 0312 02/05/22 0200  WBC 17.7* 18.5* 20.2*  RBC 3.98 3.76* 3.73*  HGB 10.8* 10.4* 10.2*  HCT 33.7* 31.8* 31.2*  MCV 84.7 84.6 83.6  MCH 27.1 27.7 27.3  MCHC 32.0 32.7 32.7  RDW 13.4 13.6 13.6  PLT 200  205 232   Thyroid  Recent Labs  Lab 02/05/22 0200  TSH 1.308    BNP Recent Labs  Lab 02/04/22 0618  BNP 483.6*    DDimer  Recent Labs  Lab 02/04/22 0614  DDIMER 0.79*     Radiology    ECHOCARDIOGRAM COMPLETE  Result Date: 02/04/2022    ECHOCARDIOGRAM REPORT   Patient Name:   Laurie Patel Date of Exam: 02/04/2022 Medical Rec #:  [id number]      Height:       65.5 in Accession #:    [id number]     Weight:       143.3 lb Date of Birth:  19-Oct-1927      BSA:          1.727 m Patient Age:    88 years       BP:           131/60 mmHg Patient Gender: F              HR:           90 bpm. Exam Location:  [facility] Procedure: 2D Echo, Cardiac Doppler and Color Doppler Indications:     I48.91 Atrial Fibrillation  History:         Patient has prior history of Echocardiogram examinations, most                  recent 01/30/2021. CHF, Arrythmias:Atrial  Fibrillation; Risk                  Factors:Hypertension, Diabetes and Dyslipidemia.  Sonographer:     Ceasar Mons Referring Phys:  [id number] CHRISTOPHER P DANFORD Diagnosing Phys: Yvonne Kendall MD  Sonographer Comments: Suboptimal apical window and suboptimal subcostal window. Image acquisition challenging due to uncooperative patient. IMPRESSIONS  1. Left ventricular ejection fraction, by estimation, is 60 to 65%. The left ventricle has normal function. Left ventricular endocardial border not optimally defined to evaluate regional wall motion. There is moderate asymmetric left ventricular hypertrophy of the septal segment. Left ventricular diastolic parameters are indeterminate.  2. Right ventricular systolic function is normal. The right ventricular size is normal. There is moderately elevated pulmonary artery systolic pressure.  3. Left atrial size was mildly dilated.  4. The mitral valve is abnormal. No evidence of mitral valve regurgitation.  5. Tricuspid valve regurgitation is mild to moderate.  6. The aortic valve is tricuspid. Aortic valve regurgitation is not visualized. No aortic stenosis is present.  7. The inferior vena cava is normal in size with <50% respiratory variability, suggesting right atrial pressure of 8 mmHg. FINDINGS  Left Ventricle: Left ventricular ejection fraction, by estimation, is 60 to 65%. The left ventricle has normal function. Left ventricular endocardial border not optimally defined to evaluate regional wall motion. The left ventricular internal cavity size was normal in size. There is moderate asymmetric left ventricular hypertrophy of the septal segment. Left ventricular diastolic parameters are indeterminate. Right Ventricle: The right ventricular size is normal. No increase in right ventricular wall thickness. Right ventricular systolic function is normal. There is moderately elevated pulmonary artery systolic pressure. The tricuspid regurgitant velocity is 3.12 m/s, and  with an assumed right atrial pressure of 8 mmHg, the estimated right ventricular systolic pressure is 46.9 mmHg. Left Atrium: Left atrial size was mildly dilated. Right Atrium: Right atrial size was normal in size. Pericardium: There is no evidence of pericardial effusion. Presence of epicardial fat layer. Mitral Valve: The mitral valve is abnormal.  There is mild thickening of the mitral valve leaflet(s). There is mild calcification of the mitral valve leaflet(s). Mild to moderate mitral annular calcification. No evidence of mitral valve regurgitation. Tricuspid Valve: The tricuspid valve is normal in structure. Tricuspid valve regurgitation is mild to moderate. Aortic Valve: The aortic valve is tricuspid. Aortic valve regurgitation is not visualized. No aortic stenosis is present. Aortic valve mean gradient measures 4.0 mmHg. Aortic valve peak gradient measures 7.1 mmHg. Aortic valve area, by VTI measures 2.64 cm. Pulmonic Valve: The pulmonic valve was normal in structure. Pulmonic valve regurgitation is mild to moderate. No evidence of pulmonic stenosis. Aorta: The aortic root and ascending aorta are structurally normal, with no evidence of dilitation. Venous: The inferior vena cava is normal in size with less than 50% respiratory variability, suggesting right atrial pressure of 8 mmHg. IAS/Shunts: The interatrial septum was not well visualized.  LEFT VENTRICLE PLAX 2D LVIDd:         2.91 cm LVIDs:         2.05 cm LV PW:         1.00 cm LV IVS:        1.44 cm LVOT diam:     1.90 cm LV SV:         63 LV SV Index:   36 LVOT Area:     2.84 cm  RIGHT VENTRICLE RV Basal diam:  3.05 cm RV S prime:     11.40 cm/s TAPSE (M-mode): 2.4 cm LEFT ATRIUM           Index        RIGHT ATRIUM           Index LA diam:      3.10 cm 1.80 cm/m   RA Area:     16.50 cm LA Vol (A4C): 61.0 ml 35.33 ml/m  RA Volume:   45.60 ml  26.41 ml/m  AORTIC VALVE AV Area (Vmax):    2.49 cm AV Area (Vmean):   2.34 cm AV Area (VTI):     2.64  cm AV Vmax:           133.00 cm/s AV Vmean:          94.600 cm/s AV VTI:            0.237 m AV Peak Grad:      7.1 mmHg AV Mean Grad:      4.0 mmHg LVOT Vmax:         117.00 cm/s LVOT Vmean:        78.100 cm/s LVOT VTI:          0.221 m LVOT/AV VTI ratio: 0.93  AORTA Ao Root diam: 3.00 cm MITRAL VALVE                TRICUSPID VALVE MV Area (PHT): 3.61 cm     TR Peak grad:   38.9 mmHg MV Decel Time: 210 msec     TR Vmax:        312.00 cm/s MV E velocity: 86.90 cm/s MV A velocity: 127.00 cm/s  SHUNTS MV E/A ratio:  0.68         Systemic VTI:  0.22 m                             Systemic Diam: 1.90 cm Yvonne Kendall MD Electronically signed by Yvonne Kendall MD Signature Date/Time: 02/04/2022/1:58:22 PM    Final  CT Angio Chest Pulmonary Embolism (PE) W or WO Contrast  Result Date: 02/04/2022 CLINICAL DATA:  Pulmonary embolism suspected, positive D-dimer, recent left hip fracture status post repair EXAM: CT ANGIOGRAPHY CHEST WITH CONTRAST TECHNIQUE: Multidetector CT imaging of the chest was performed using the standard protocol during bolus administration of intravenous contrast. Multiplanar CT image reconstructions and MIPs were obtained to evaluate the vascular anatomy. RADIATION DOSE REDUCTION: This exam was performed according to the departmental dose-optimization program which includes automated exposure control, adjustment of the mA and/or kV according to patient size and/or use of iterative reconstruction technique. CONTRAST:  63mL OMNIPAQUE IOHEXOL 350 MG/ML SOLN COMPARISON:  None Available. FINDINGS: Cardiovascular: Satisfactory opacification of the pulmonary arteries to the segmental level. No evidence of pulmonary embolism. Mild cardiomegaly. Three-vessel coronary artery calcifications. No pericardial effusion. Aortic atherosclerosis. Mediastinum/Nodes: No enlarged mediastinal, hilar, or axillary lymph nodes. Thyroid gland, trachea, and esophagus demonstrate no significant findings. Lungs/Pleura:  Trace bilateral pleural effusions and associated atelectasis or consolidation. 0.4 cm nodule of the peripheral right upper lobe (series 6, image 21). Upper Abdomen: No acute abnormality. Definitively benign, macroscopic fat containing bilateral adrenal adenomata, for which no further follow-up or characterization is required (series 5, image 299) Musculoskeletal: No chest wall abnormality. Age indeterminate inferior endplate wedge deformity of the T10 vertebral body (series 8, image 53) Review of the MIP images confirms the above findings. IMPRESSION: 1. Negative examination for pulmonary embolism. 2. Trace bilateral pleural effusions and associated atelectasis or consolidation. 3. Incidental note of a 0.4 cm nodule of the peripheral right upper lobe. No follow-up needed if patient is low-risk.This recommendation follows the consensus statement: Guidelines for Management of Incidental Pulmonary Nodules Detected on CT Images: From the Fleischner Society 2017; Radiology 2017; 284:228-243. 4. Age indeterminate inferior endplate wedge deformity of the T10 vertebral body. Correlate for acute pain and point tenderness. 5. Coronary artery disease. Aortic Atherosclerosis (ICD10-I70.0). Electronically Signed   By: Jearld Lesch M.D.   On: 02/04/2022 09:07    Cardiac Studies     Patient Profile     Laurie Patel is a 88 y.o. female with a hx of chronic HFpEF, left bundle branch block, hypertension complicated by whitecoat syndrome, and type 2 diabetes mellitus, who is being seen 02/04/2022 for the evaluation of abnormal EKG concerning for STEMI , noted to be in atrial fibrillation  Assessment & Plan    Atrial fibrillation with rapid ventricular response: onset of tachycardia yesterday morning consistent with A-fib with RVR -Converting back to normal sinus rhythm on metoprolol every 6 Currently on heparin infusion Would plan on transition to DOAC prior to discharge. -We will change metoprolol every 6 hours to  metoprolol to tartrate 25 twice daily   Elevated troponin /demand ischemia Chronic left bundle branch block:  Coronary artery calcification noted on CT scan not having any concerning symptoms such as angina or dyspnea,  Denies anginal symptoms, Would commend outpatient follow-up for consideration of ischemic work-up, stress testing versus cardiac CTA   Chronic HFpEF: Trace bilateral pleural effusions on CT scan Would moderate IV fluids continue low-dose furosemide   Hypertension: on lisinopril, metoprolol and furosemide.   Hip fracture: -Per orthopedics and internal medicine.   Total encounter time more than 50 minutes  Greater than 50% was spent in counseling and coordination of care with the patient   For questions or updates, please contact [facility] Please consult [email or web address] for contact info under        Signed, Julien Nordmann, MD  02/05/2022, 9:54 AM

## 2022-02-05 NOTE — Telephone Encounter (Signed)
Pharmacy Patient Advocate Encounter  Insurance verification completed.    The patient is insured through [employer] Medicare Part D   The patient is currently admitted and ran test claims for the following: Eliquis.  Copays and coinsurance results were relayed to Inpatient clinical team.  

## 2022-02-05 NOTE — Plan of Care (Signed)
  Problem: Education: Goal: Knowledge of General Education information will improve Description: Including pain rating scale, medication(s)/side effects and non-pharmacologic comfort measures Outcome: Progressing   Problem: Health Behavior/Discharge Planning: Goal: Ability to manage health-related needs will improve Outcome: Progressing   Problem: Clinical Measurements: Goal: Ability to maintain clinical measurements within normal limits will improve Outcome: Progressing Goal: Will remain free from infection Outcome: Progressing Goal: Diagnostic test results will improve Outcome: Progressing Goal: Respiratory complications will improve Outcome: Progressing Goal: Cardiovascular complication will be avoided Outcome: Progressing   Problem: Activity: Goal: Risk for activity intolerance will decrease Outcome: Progressing   Problem: Nutrition: Goal: Adequate nutrition will be maintained Outcome: Progressing   Problem: Coping: Goal: Level of anxiety will decrease Outcome: Progressing   Problem: Elimination: Goal: Will not experience complications related to bowel motility Outcome: Progressing Goal: Will not experience complications related to urinary retention Outcome: Progressing   Problem: Pain Managment: Goal: General experience of comfort will improve Outcome: Progressing   Problem: Safety: Goal: Ability to remain free from injury will improve Outcome: Progressing   Problem: Skin Integrity: Goal: Risk for impaired skin integrity will decrease Outcome: Progressing   Problem: Education: Goal: Ability to describe self-care measures that may prevent or decrease complications (Diabetes Survival Skills Education) will improve Outcome: Progressing   Problem: Coping: Goal: Ability to adjust to condition or change in health will improve Outcome: Progressing   Problem: Fluid Volume: Goal: Ability to maintain a balanced intake and output will improve Outcome:  Progressing   Problem: Health Behavior/Discharge Planning: Goal: Ability to identify and utilize available resources and services will improve Outcome: Progressing Goal: Ability to manage health-related needs will improve Outcome: Progressing   Problem: Metabolic: Goal: Ability to maintain appropriate glucose levels will improve Outcome: Progressing   Problem: Nutritional: Goal: Maintenance of adequate nutrition will improve Outcome: Progressing Goal: Progress toward achieving an optimal weight will improve Outcome: Progressing   Problem: Skin Integrity: Goal: Risk for impaired skin integrity will decrease Outcome: Progressing   Problem: Tissue Perfusion: Goal: Adequacy of tissue perfusion will improve Outcome: Progressing   

## 2022-02-05 NOTE — Progress Notes (Signed)
Physical Therapy Treatment Patient Details Name: Laurie Patel MRN: [id number] DOB: 06-05-1928 Today's Date: 02/05/2022   History of Present Illness Laurie Patel. Laurie Patel is a 88 y.o. female who presented to the ER secondary to mechanical fall in home environment, acute onset of L hip, L shoulder pain; admitted for management of L comminuted intertrochanteric hip fracture s/p ORIF (02/02/22), L proximal humeral fx, non-operative, LUE to remain immobilized/NWB Per MD.    PT Comments    Attempted pm session, pt politely declined stating she would like to try later. Will try to return, otherwise will plan to see pt for continued PT in am. Continue to recommend short term stay in SNF prior to returning home with daughter.   Recommendations for follow up therapy are one component of a multi-disciplinary discharge planning process, led by the attending physician.  Recommendations may be updated based on patient status, additional functional criteria and insurance authorization.  Follow Up Recommendations  Skilled nursing-short term rehab (<3 hours/day) Can patient physically be transported by private vehicle: No   Assistance Recommended at Discharge Frequent or constant Supervision/Assistance  Patient can return home with the following Two people to help with walking and/or transfers;Two people to help with bathing/dressing/bathroom;Assistance with cooking/housework;Help with stairs or ramp for entrance;Assist for transportation   Equipment Recommendations  Other (comment) (TBD at next facility)    Recommendations for Other Services       Precautions / Restrictions Precautions Precautions: Fall Required Braces or Orthoses: Sling Restrictions Weight Bearing Restrictions: Yes LUE Weight Bearing: Non weight bearing LLE Weight Bearing: Weight bearing as tolerated     Mobility  Bed Mobility               General bed mobility comments: Pt received in chair and returned to chair     Transfers Overall transfer level: Needs assistance Equipment used: None Transfers: Sit to/from Stand, Bed to chair/wheelchair/BSC Sit to Stand: Max assist Stand pivot transfers: Max assist         General transfer comment: Pt minimally assisting with transfers due to L UE and LE wt limitations/pain    Ambulation/Gait                   Stairs             Wheelchair Mobility    Modified Rankin (Stroke Patients Only)       Balance Overall balance assessment: Needs assistance Sitting-balance support: No upper extremity supported, Feet supported Sitting balance-Leahy Scale: Good                                      Cognition Arousal/Alertness: Awake/alert Behavior During Therapy: WFL for tasks assessed/performed Overall Cognitive Status: No family/caregiver present to determine baseline cognitive functioning                                 General Comments: Decreased safety awarenss, decreased recall of precautions and NWB status in LUE. Decreased awareness of deficits with functional use of LUE and LLE.        Exercises General Exercises - Lower Extremity Ankle Circles/Pumps: AROM, Both, 10 reps Long Arc Quad: AROM, Both, 10 reps Hip Flexion/Marching: AROM, Right, 10 reps    General Comments General comments (skin integrity, edema, etc.): Pt educated on importance of following weight bearing compliance, with fair carry  over      Pertinent Vitals/Pain Pain Assessment Pain Assessment: 0-10 Pain Score: 4  Pain Location: Endorses mild "stiffness" in L hip. Otherwise, no pain. Pain Descriptors / Indicators: Aching, Grimacing, Guarding Pain Intervention(s): Monitored during session    Home Living                          Prior Function            PT Goals (current goals can now be found in the care plan section) Acute Rehab PT Goals Patient Stated Goal: to get back home    Frequency     BID      PT Plan Current plan remains appropriate    Co-evaluation              AM-PAC PT "6 Clicks" Mobility   Outcome Measure  Help needed turning from your back to your side while in a flat bed without using bedrails?: A Lot Help needed moving from lying on your back to sitting on the side of a flat bed without using bedrails?: A Lot Help needed moving to and from a bed to a chair (including a wheelchair)?: Total Help needed standing up from a chair using your arms (e.g., wheelchair or bedside chair)?: Total Help needed to walk in hospital room?: Total Help needed climbing 3-5 steps with a railing? : Total 6 Click Score: 8    End of Session Equipment Utilized During Treatment: Gait belt Activity Tolerance: Patient tolerated treatment well Patient left: in chair;with call bell/phone within reach;with chair alarm set Nurse Communication: Mobility status PT Visit Diagnosis: Muscle weakness (generalized) (M62.81);Other abnormalities of gait and mobility (R26.89);Pain Pain - Right/Left: Left Pain - part of body: Hip     Time: 1130-1210 PT Time Calculation (min) (ACUTE ONLY): 40 min  Charges:  $Therapeutic Exercise: 8-22 mins $Therapeutic Activity: 23-37 mins                    Zadie Cleverly, PTA    Jannet Askew 02/05/2022, 1:46 PM

## 2022-02-05 NOTE — Progress Notes (Signed)
  Progress Note   Patient: Laurie Patel CNO:[id number] DOB: 1927-07-02 DOA: 02/01/2022     4 DOS: the patient was seen and examined on 02/05/2022 at 10:09AM      Brief hospital course: Laurie Patel is a 88 y.o. F with HTN, DM, dCHF, and hx LBBB who presented with a mechanical fall and left hip pain.  In the ER, radiograph showed left hip fracture.  Ortho consulted.     Assessment and Plan: * Closed intertrochanteric fracture of hip, left, initial encounter (HCC) S/p Intramedullary fixation for left intertrochanteric hip fracturby Dr. Martha Clan.  Vit D 52. - PT eval    Essential hypertension - Stop lisinopril - Continue furosemide - Continue new metoprolol for Afib  Chronic diastolic CHF (congestive heart failure) (HCC) Appears euvolemic - Continue Lasix  Paroxysmal atrial fibrillation (HCC) New onset 8/14 postoperatively.  Cardiology were consulted, they recommended anticoagulation and started metoprolol. - Continue heparin drip for now --Transition to DOAC at d/c (pt expressed unable to afford if not fully pd by Medicare, cardiology TOC and pharmacy aware) - Trend troponins - Continue metoprolol  Atelectasis Hypoxia noted here.  This is from atelectasis and hypopnea from opiates.  CTA chest ruled out PE, pneumonia, edema.  Her abdomen (which is her baseline she claims, so I do not suspect ascites) is contributing. - IS, mobilization - Wean O2  Closed fracture of left proximal humerus - Nonweightbearing to left upper extremity - Maintain left arm in sling -Outpatient orthopedics follow-up  Hyperlipidemia Not on a statin, presumably due to age  Controlled type 2 diabetes mellitus without complication, without long-term current use of insulin (HCC) This is diet controlled.  She had mild hyperglycemia here.  Last hemoglobin A1c was 7.9%.   - Continue SS corrections          Subjective: Seated in recliner.  No acute complaints. Denies fever/chills, CP, SOB,  palpitations, dizziness lightheadedness.  Does not like the hospital food at all.  Agreeable for SNF/rehab.     Physical Exam: Vitals:   02/05/22 0300 02/05/22 0454 02/05/22 0752 02/05/22 1644  BP: (!) 103/39 (!) 154/54 (!) 154/62 111/82  Pulse: 71 81 86 85  Resp: 18 20 16 16   Temp: 99.1 F (37.3 C) 98.9 F (37.2 C) 98.6 F (37 C) 98.5 F (36.9 C)  TempSrc: Oral     SpO2: 99% 99% 95% 98%  Weight:      Height:       General exam: awake, alert, no acute distress HEENT: atraumatic, clear conjunctiva, anicteric sclera, moist mucus membranes, hearing grossly normal  Respiratory system: CTAB, no wheezes, rales or rhonchi, normal respiratory effort. Cardiovascular system: normal S1/S2, RRR Gastrointestinal system: soft, NT, ND Central nervous system: A&O x3. no gross focal neurologic deficits, normal speech Extremities: moves all, no edema, normal tone Skin: dry, intact, normal temperature Psychiatry: normal mood, congruent affect, judgement and insight appear normal     Data Reviewed: Case discussed with orthopedics and cardiology Labs notable for CBG this AM 211, CMP with glucose 219, BUN 33 otherwise normal.  CBC with WBC up to 20.2k but pt denies fever/chills or feeling sick, Hbg 10.2 stable   Family Communication: None    Disposition: Status is: Inpatient  Patient admitted with hip fracture.   Post-op course complicated by new Afib.   Awaiting SNF placement         Author: , DO 02/05/2022 7:27 PM  For on call review [email or web address].

## 2022-02-05 NOTE — Progress Notes (Signed)
Physical Therapy Treatment Patient Details Name: Laurie Patel MRN: [id number] DOB: [birth year] Today's Date: 02/05/2022   History of Present Illness Laurie Patel is a 88 y.o. female who presented to the ER secondary to mechanical fall in home environment, acute onset of L hip, L shoulder pain; admitted for management of L comminuted intertrochanteric hip fracture s/p ORIF (02/02/22), L proximal humeral fx, non-operative, LUE to remain immobilized/NWB Per MD.    PT Comments    Pt feeling much better today and able to tolerate skilled PT session. Reviewed weight bearing precautions and educated pt on importance of maintaining compliance with L UE restrictions. Pt stated she understood, however while mobilizing, she attempted to utilize L UE for support. Pt completed stand pivot transfers without AD.  MaxA to raise from surface, ModA to pivot, and MaxA to lower to seated position. Pt is very motivated to participate and improve function in order to return home with daughter. She is a great candidate for short term SNF once medically cleared. Pt left sitting upright in recliner with all needs in reach. Continue PT again this afternoon.   Recommendations for follow up therapy are one component of a multi-disciplinary discharge planning process, led by the attending physician.  Recommendations may be updated based on patient status, additional functional criteria and insurance authorization.  Follow Up Recommendations  Skilled nursing-short term rehab (<3 hours/day) Can patient physically be transported by private vehicle: No   Assistance Recommended at Discharge Frequent or constant Supervision/Assistance  Patient can return home with the following Two people to help with walking and/or transfers;Two people to help with bathing/dressing/bathroom;Assistance with cooking/housework;Help with stairs or ramp for entrance;Assist for transportation   Equipment Recommendations  Other (comment) (TBD at  next facility)    Recommendations for Other Services       Precautions / Restrictions Precautions Precautions: Fall Required Braces or Orthoses: Sling Restrictions Weight Bearing Restrictions: Yes LUE Weight Bearing: Non weight bearing LLE Weight Bearing: Weight bearing as tolerated     Mobility  Bed Mobility               General bed mobility comments: Pt received in chair and returned to chair    Transfers Overall transfer level: Needs assistance Equipment used: None Transfers: Sit to/from Stand, Bed to chair/wheelchair/BSC Sit to Stand: Max assist Stand pivot transfers: Max assist         General transfer comment: Pt minimally assisting with transfers due to L UE and LE wt limitations/pain    Ambulation/Gait                   Stairs             Wheelchair Mobility    Modified Rankin (Stroke Patients Only)       Balance Overall balance assessment: Needs assistance Sitting-balance support: No upper extremity supported, Feet supported Sitting balance-Leahy Scale: Good                                      Cognition Arousal/Alertness: Awake/alert Behavior During Therapy: WFL for tasks assessed/performed Overall Cognitive Status: No family/caregiver present to determine baseline cognitive functioning                                 General Comments: Decreased safety awarenss, decreased recall of precautions and NWB status  in LUE. Decreased awareness of deficits with functional use of LUE and LLE.        Exercises General Exercises - Lower Extremity Ankle Circles/Pumps: AROM, Both, 10 reps Long Arc Quad: AROM, Both, 10 reps Hip Flexion/Marching: AROM, Right, 10 reps    General Comments General comments (skin integrity, edema, etc.): Pt educated on importance of following weight bearing compliance, with fair carry over      Pertinent Vitals/Pain Pain Assessment Pain Assessment: 0-10 Pain Score: 4   Pain Location: Endorses mild "stiffness" in L hip. Otherwise, no pain. Pain Descriptors / Indicators: Aching, Grimacing, Guarding Pain Intervention(s): Monitored during session    Home Living                          Prior Function            PT Goals (current goals can now be found in the care plan section) Acute Rehab PT Goals Patient Stated Goal: to get back home    Frequency    BID      PT Plan Current plan remains appropriate    Co-evaluation              AM-PAC PT "6 Clicks" Mobility   Outcome Measure  Help needed turning from your back to your side while in a flat bed without using bedrails?: A Lot Help needed moving from lying on your back to sitting on the side of a flat bed without using bedrails?: A Lot Help needed moving to and from a bed to a chair (including a wheelchair)?: Total Help needed standing up from a chair using your arms (e.g., wheelchair or bedside chair)?: Total Help needed to walk in hospital room?: Total Help needed climbing 3-5 steps with a railing? : Total 6 Click Score: 8    End of Session Equipment Utilized During Treatment: Gait belt Activity Tolerance: Patient tolerated treatment well Patient left: in chair;with call bell/phone within reach;with chair alarm set Nurse Communication: Mobility status PT Visit Diagnosis: Muscle weakness (generalized) (M62.81);Other abnormalities of gait and mobility (R26.89);Pain Pain - Right/Left: Left Pain - part of body: Hip     Time: 1130-1210 PT Time Calculation (min) (ACUTE ONLY): 40 min  Charges:  $Therapeutic Exercise: 8-22 mins $Therapeutic Activity: 23-37 mins                    Zadie Cleverly, PTA    Jannet Askew 02/05/2022, 12:44 PM

## 2022-02-05 NOTE — Progress Notes (Signed)
Occupational Therapy Treatment Patient Details Name: Laurie Patel MRN: [id number] DOB: Apr 21, 1928 Today's Date: 02/05/2022   History of present illness Laurie Patel is a 88 y.o. female who presented to the ER secondary to mechanical fall in home environment, acute onset of L hip, L shoulder pain; admitted for management of L comminuted intertrochanteric hip fracture s/p ORIF (02/02/22), L proximal humeral fx, non-operative, LUE to remain immobilized/NWB Per MD.   OT comments  Pt presented to OT services seated in recliner, sling/immobilizer doffed. Pt c/o of discomfort with sling and states "I can't wear that thing it's digging into my neck!". OT provided max assist to don sling and adjust for comfort. Pt endorses improved fit and comfort with adjusted sling. Declines to wear waist strap of immobilizer. Educated on importance of proper immobilization on healing and importance of maintaining NWB through her LUE until cleared by ortho. Pt return verbalizes understanding but demonstrates decreased awareness of safety and deficits this session. Attempted standing x2 during session however pt is unable to come to standing despite MAX A and MAX cueing on sequencing/foot placement. Pt educated on falls prevention strategies and transfer techniques during session. Pt continues to require +2 MAX A for functional mobility, MAX A for bathing and dressing tasks, and SET UP to MIN A for UB ADL management in seated position. No family present for education during session and pt noted with poor recall from past sessions. She continues to benefit from skilled OT services to maximize safety and recall of WB precautions. Will continue to follow POC as written. DC recommendation remains appropriate.    Recommendations for follow up therapy are one component of a multi-disciplinary discharge planning process, led by the attending physician.  Recommendations may be updated based on patient status, additional functional  criteria and insurance authorization.    Follow Up Recommendations  Skilled nursing-short term rehab (<3 hours/day)    Assistance Recommended at Discharge Frequent or constant Supervision/Assistance  Patient can return home with the following  A lot of help with walking and/or transfers;A lot of help with bathing/dressing/bathroom;Assist for transportation;Help with stairs or ramp for entrance   Equipment Recommendations  None recommended by OT    Recommendations for Other Services      Precautions / Restrictions Precautions Precautions: Fall Required Braces or Orthoses: Sling (LUE) Restrictions Weight Bearing Restrictions: Yes LUE Weight Bearing: Non weight bearing LLE Weight Bearing: Weight bearing as tolerated       Mobility Bed Mobility               General bed mobility comments: Deferred. Pt in recliner at start/end of session.    Transfers Overall transfer level: Needs assistance   Transfers: Sit to/from Stand Sit to Stand: +2 physical assistance, Max assist                 Balance Overall balance assessment: Needs assistance Sitting-balance support: No upper extremity supported, Feet supported Sitting balance-Leahy Scale: Good     Standing balance support: Single extremity supported Standing balance-Leahy Scale: Zero                             ADL either performed or assessed with clinical judgement   ADL Overall ADL's : Needs assistance/impaired  General ADL Comments: Pt presented to OT services seated in recliner, sling/immobilizer doffed. Pt c/o of discomfort with sling and states "I can't wear that thing it's digging into my neck!". OT provided max assist to don sling and adjust for comfort. Pt endorses improved fit and comfort with adjusted sling. Declines to wear waist strap of immobilizer. Educated on importance of proper immobilization on healing and importance of  maintaining NWB through her LUE until cleared by ortho. Pt return verbalizes but demonstrates decreased awareness of safety and deficits this session. Attempted standing x2 during session however pt is unable to come to standing despite MAX A and MAX cueing on sequencing/foot placement. Pt continues to require +2 MAX A for functional mobility. MAX A for bathing and dressing tasks.    Extremity/Trunk Assessment              Vision Ability to See in Adequate Light: 1 Impaired Patient Visual Report: No change from baseline     Perception     Praxis      Cognition Arousal/Alertness: Awake/alert Behavior During Therapy: WFL for tasks assessed/performed Overall Cognitive Status: No family/caregiver present to determine baseline cognitive functioning                                 General Comments: Decreased safety awarenss, decreased recall of precautions and NWB status in LUE. Decreased awareness of deficits with functional use of LUE and LLE.        Exercises Other Exercises Other Exercises: Educated in [place] precautions L UE/LE; patient voiced understanding, but requires consistent cuing for L UE immobilization and NWB.  Sling not donned upon arrival to room; placed and adjusted for proper fit and protection to L UE.    Shoulder Instructions       General Comments      Pertinent Vitals/ Pain       Pain Assessment Pain Assessment: No/denies pain Pain Location: Endorses mild "stiffness" in L hip. Otherwise, no pain.  Home Living                                          Prior Functioning/Environment              Frequency  Min 2X/week        Progress Toward Goals  OT Goals(current goals can now be found in the care plan section)  Progress towards OT goals: Progressing toward goals  Acute Rehab OT Goals Patient Stated Goal: To get back to gardening OT Goal Formulation: With patient Time For Goal Achievement:  02/17/22 Potential to Achieve Goals: Good  Plan Discharge plan remains appropriate;Frequency remains appropriate    Co-evaluation                 AM-PAC OT "6 Clicks" Daily Activity     Outcome Measure   Help from another person eating meals?: A Little Help from another person taking care of personal grooming?: A Little Help from another person toileting, which includes using toliet, bedpan, or urinal?: A Lot Help from another person bathing (including washing, rinsing, drying)?: A Lot Help from another person to put on and taking off regular upper body clothing?: A Little Help from another person to put on and taking off regular lower body clothing?: A Lot 6 Click Score: 15  End of Session Equipment Utilized During Treatment: Gait belt;Rolling walker (2 wheels)  OT Visit Diagnosis: Unsteadiness on feet (R26.81);Muscle weakness (generalized) (M62.81)   Activity Tolerance Patient tolerated treatment well   Patient Left in chair;with call bell/phone within reach;with chair alarm set   Nurse Communication          Time: 3267-1245 OT Time Calculation (min): 23 min  Charges: OT General Charges $OT Visit: 1 Visit OT Treatments $Self Care/Home Management : 23-37 mins  Rockney Ghee, M.S., OTR/L Ascom: [phone] 02/05/22, 11:46 AM

## 2022-02-05 NOTE — TOC Progression Note (Signed)
Transition of Care [facility]) - Progression Note    Patient Details  Name: Laurie Patel MRN: [id number] Date of Birth: 06-12-1928  Transition of Care [facility]) CM/SW Contact  Truddie Hidden, RN Phone Number: 02/05/2022, 10:23 PM  Clinical Narrative:    Spoke with patient at bedside regarding disposition plan. Patient prefers to go to SNF. Agreeable to [facility] and [employer] only.         Expected Discharge Plan and Services                                                 Social Determinants of Health (SDOH) Interventions    Readmission Risk Interventions     No data to display

## 2022-02-05 NOTE — Progress Notes (Signed)
Subjective:  POD #3 s/p intramedullary fixation of left intertrochanteric hip fracture.   Patient reports left hip pain as mild.  Patient states she feels better today.  She is up out of bed to a chair.  Patient has returned to normal sinus rhythm after developing paroxysmal atrial fibrillation postop.    Objective:   VITALS:   Vitals:   02/04/22 1938 02/05/22 0300 02/05/22 0454 02/05/22 0752  BP: (!) 138/56 (!) 103/39 (!) 154/54 (!) 154/62  Pulse: 70 71 81 86  Resp: 20 18 20 16   Temp: 98.5 F (36.9 C) 99.1 F (37.3 C) 98.9 F (37.2 C) 98.6 F (37 C)  TempSrc:  Oral    SpO2: 97% 99% 99% 95%  Weight:      Height:        PHYSICAL EXAM: Left lower extremity Neurovascular intact Sensation intact distally Intact pulses distally Dorsiflexion/Plantar flexion intact Incision: I personally change the patient's dressing today.  She had moderate sanguinous drainage on the proximalmost honeycomb dressing.  No active drainage or bleeding from her incisions. No cellulitis present Compartment soft  LABS  Results for orders placed or performed during the hospital encounter of 02/01/22 (from the past 24 hour(s))  Glucose, capillary     Status: Abnormal   Collection Time: 02/04/22  5:46 PM  Result Value Ref Range   Glucose-Capillary 208 (H) 70 - 99 mg/dL  Heparin level (unfractionated)     Status: None   Collection Time: 02/04/22  5:50 PM  Result Value Ref Range   Heparin Unfractionated 0.37 0.30 - 0.70 IU/mL  Glucose, capillary     Status: Abnormal   Collection Time: 02/04/22  9:26 PM  Result Value Ref Range   Glucose-Capillary 184 (H) 70 - 99 mg/dL  CBC     Status: Abnormal   Collection Time: 02/05/22  2:00 AM  Result Value Ref Range   WBC 20.2 (H) 4.0 - 10.5 K/uL   RBC 3.73 (L) 3.87 - 5.11 MIL/uL   Hemoglobin 10.2 (L) 12.0 - 15.0 g/dL   HCT 02/07/22 (L) 12.4 - 58.0 %   MCV 83.6 80.0 - 100.0 fL   MCH 27.3 26.0 - 34.0 pg   MCHC 32.7 30.0 - 36.0 g/dL   RDW 99.8 33.8 - 25.0 %    Platelets 232 150 - 400 K/uL   nRBC 0.0 0.0 - 0.2 %  TSH     Status: None   Collection Time: 02/05/22  2:00 AM  Result Value Ref Range   TSH 1.308 0.350 - 4.500 uIU/mL  Basic metabolic panel     Status: Abnormal   Collection Time: 02/05/22  2:00 AM  Result Value Ref Range   Sodium 135 135 - 145 mmol/L   Potassium 3.8 3.5 - 5.1 mmol/L   Chloride 99 98 - 111 mmol/L   CO2 28 22 - 32 mmol/L   Glucose, Bld 219 (H) 70 - 99 mg/dL   BUN 33 (H) 8 - 23 mg/dL   Creatinine, Ser 02/07/22 0.44 - 1.00 mg/dL   Calcium 9.2 8.9 - 7.67 mg/dL   GFR, Estimated 34.1 >93 mL/min   Anion gap 8 5 - 15  Heparin level (unfractionated)     Status: None   Collection Time: 02/05/22  2:00 AM  Result Value Ref Range   Heparin Unfractionated 0.31 0.30 - 0.70 IU/mL  Glucose, capillary     Status: Abnormal   Collection Time: 02/05/22  7:45 AM  Result Value Ref Range   Glucose-Capillary  211 (H) 70 - 99 mg/dL    ECHOCARDIOGRAM COMPLETE  Result Date: 02/04/2022    ECHOCARDIOGRAM REPORT   Patient Name:   Laurie Patel Janvier Date of Exam: 02/04/2022 Medical Rec #:  [id number]      Height:       65.5 in Accession #:    [id number]     Weight:       143.3 lb Date of Birth:  06-12-1928      BSA:          1.727 m Patient Age:    88 years       BP:           131/60 mmHg Patient Gender: F              HR:           90 bpm. Exam Location:  [facility] Procedure: 2D Echo, Cardiac Doppler and Color Doppler Indications:     I48.91 Atrial Fibrillation  History:         Patient has prior history of Echocardiogram examinations, most                  recent 01/30/2021. CHF, Arrythmias:Atrial Fibrillation; Risk                  Factors:Hypertension, Diabetes and Dyslipidemia.  Sonographer:     Ceasar Mons Referring Phys:  [id number] CHRISTOPHER P DANFORD Diagnosing Phys: Yvonne Kendall MD  Sonographer Comments: Suboptimal apical window and suboptimal subcostal window. Image acquisition challenging due to uncooperative patient. IMPRESSIONS  1. Left ventricular  ejection fraction, by estimation, is 60 to 65%. The left ventricle has normal function. Left ventricular endocardial border not optimally defined to evaluate regional wall motion. There is moderate asymmetric left ventricular hypertrophy of the septal segment. Left ventricular diastolic parameters are indeterminate.  2. Right ventricular systolic function is normal. The right ventricular size is normal. There is moderately elevated pulmonary artery systolic pressure.  3. Left atrial size was mildly dilated.  4. The mitral valve is abnormal. No evidence of mitral valve regurgitation.  5. Tricuspid valve regurgitation is mild to moderate.  6. The aortic valve is tricuspid. Aortic valve regurgitation is not visualized. No aortic stenosis is present.  7. The inferior vena cava is normal in size with <50% respiratory variability, suggesting right atrial pressure of 8 mmHg. FINDINGS  Left Ventricle: Left ventricular ejection fraction, by estimation, is 60 to 65%. The left ventricle has normal function. Left ventricular endocardial border not optimally defined to evaluate regional wall motion. The left ventricular internal cavity size was normal in size. There is moderate asymmetric left ventricular hypertrophy of the septal segment. Left ventricular diastolic parameters are indeterminate. Right Ventricle: The right ventricular size is normal. No increase in right ventricular wall thickness. Right ventricular systolic function is normal. There is moderately elevated pulmonary artery systolic pressure. The tricuspid regurgitant velocity is 3.12 m/s, and with an assumed right atrial pressure of 8 mmHg, the estimated right ventricular systolic pressure is 46.9 mmHg. Left Atrium: Left atrial size was mildly dilated. Right Atrium: Right atrial size was normal in size. Pericardium: There is no evidence of pericardial effusion. Presence of epicardial fat layer. Mitral Valve: The mitral valve is abnormal. There is mild thickening  of the mitral valve leaflet(s). There is mild calcification of the mitral valve leaflet(s). Mild to moderate mitral annular calcification. No evidence of mitral valve regurgitation. Tricuspid Valve: The tricuspid valve is normal in structure. Tricuspid valve  regurgitation is mild to moderate. Aortic Valve: The aortic valve is tricuspid. Aortic valve regurgitation is not visualized. No aortic stenosis is present. Aortic valve mean gradient measures 4.0 mmHg. Aortic valve peak gradient measures 7.1 mmHg. Aortic valve area, by VTI measures 2.64 cm. Pulmonic Valve: The pulmonic valve was normal in structure. Pulmonic valve regurgitation is mild to moderate. No evidence of pulmonic stenosis. Aorta: The aortic root and ascending aorta are structurally normal, with no evidence of dilitation. Venous: The inferior vena cava is normal in size with less than 50% respiratory variability, suggesting right atrial pressure of 8 mmHg. IAS/Shunts: The interatrial septum was not well visualized.  LEFT VENTRICLE PLAX 2D LVIDd:         2.91 cm LVIDs:         2.05 cm LV PW:         1.00 cm LV IVS:        1.44 cm LVOT diam:     1.90 cm LV SV:         63 LV SV Index:   36 LVOT Area:     2.84 cm  RIGHT VENTRICLE RV Basal diam:  3.05 cm RV S prime:     11.40 cm/s TAPSE (M-mode): 2.4 cm LEFT ATRIUM           Index        RIGHT ATRIUM           Index LA diam:      3.10 cm 1.80 cm/m   RA Area:     16.50 cm LA Vol (A4C): 61.0 ml 35.33 ml/m  RA Volume:   45.60 ml  26.41 ml/m  AORTIC VALVE AV Area (Vmax):    2.49 cm AV Area (Vmean):   2.34 cm AV Area (VTI):     2.64 cm AV Vmax:           133.00 cm/s AV Vmean:          94.600 cm/s AV VTI:            0.237 m AV Peak Grad:      7.1 mmHg AV Mean Grad:      4.0 mmHg LVOT Vmax:         117.00 cm/s LVOT Vmean:        78.100 cm/s LVOT VTI:          0.221 m LVOT/AV VTI ratio: 0.93  AORTA Ao Root diam: 3.00 cm MITRAL VALVE                TRICUSPID VALVE MV Area (PHT): 3.61 cm     TR Peak grad:    38.9 mmHg MV Decel Time: 210 msec     TR Vmax:        312.00 cm/s MV E velocity: 86.90 cm/s MV A velocity: 127.00 cm/s  SHUNTS MV E/A ratio:  0.68         Systemic VTI:  0.22 m                             Systemic Diam: 1.90 cm Yvonne Kendall MD Electronically signed by Yvonne Kendall MD Signature Date/Time: 02/04/2022/1:58:22 PM    Final    CT Angio Chest Pulmonary Embolism (PE) W or WO Contrast  Result Date: 02/04/2022 CLINICAL DATA:  Pulmonary embolism suspected, positive D-dimer, recent left hip fracture status post repair EXAM: CT ANGIOGRAPHY CHEST WITH CONTRAST TECHNIQUE: Multidetector CT imaging  of the chest was performed using the standard protocol during bolus administration of intravenous contrast. Multiplanar CT image reconstructions and MIPs were obtained to evaluate the vascular anatomy. RADIATION DOSE REDUCTION: This exam was performed according to the departmental dose-optimization program which includes automated exposure control, adjustment of the mA and/or kV according to patient size and/or use of iterative reconstruction technique. CONTRAST:  65mL OMNIPAQUE IOHEXOL 350 MG/ML SOLN COMPARISON:  None Available. FINDINGS: Cardiovascular: Satisfactory opacification of the pulmonary arteries to the segmental level. No evidence of pulmonary embolism. Mild cardiomegaly. Three-vessel coronary artery calcifications. No pericardial effusion. Aortic atherosclerosis. Mediastinum/Nodes: No enlarged mediastinal, hilar, or axillary lymph nodes. Thyroid gland, trachea, and esophagus demonstrate no significant findings. Lungs/Pleura: Trace bilateral pleural effusions and associated atelectasis or consolidation. 0.4 cm nodule of the peripheral right upper lobe (series 6, image 21). Upper Abdomen: No acute abnormality. Definitively benign, macroscopic fat containing bilateral adrenal adenomata, for which no further follow-up or characterization is required (series 5, image 299) Musculoskeletal: No chest wall  abnormality. Age indeterminate inferior endplate wedge deformity of the T10 vertebral body (series 8, image 53) Review of the MIP images confirms the above findings. IMPRESSION: 1. Negative examination for pulmonary embolism. 2. Trace bilateral pleural effusions and associated atelectasis or consolidation. 3. Incidental note of a 0.4 cm nodule of the peripheral right upper lobe. No follow-up needed if patient is low-risk.This recommendation follows the consensus statement: Guidelines for Management of Incidental Pulmonary Nodules Detected on CT Images: From the Fleischner Society 2017; Radiology 2017; 284:228-243. 4. Age indeterminate inferior endplate wedge deformity of the T10 vertebral body. Correlate for acute pain and point tenderness. 5. Coronary artery disease. Aortic Atherosclerosis (ICD10-I70.0). Electronically Signed   By: Jearld Lesch M.D.   On: 02/04/2022 09:07    Assessment/Plan: 3 Days Post-Op   Principal Problem:   Closed intertrochanteric fracture of hip, left, initial encounter [facility]) Active Problems:   Controlled type 2 diabetes mellitus without complication, without long-term current use of insulin (HCC)   Hyperlipidemia   Chronic diastolic CHF (congestive heart failure) (HCC)   Essential hypertension   Closed fracture of left proximal humerus   Atelectasis   Paroxysmal atrial fibrillation (HCC)  Patient's hemoglobin remains stable.  WBC is slightly elevated, up to 20.2 from 18.5.  Last glucose was 219.  Would recommend insulin sliding scale for better blood sugar control.  Patient seen by cardiology.  Patient has returned to sinus rhythm.  Troponin is 174.  Cardiology recommending outpatient work-up.  Continue physical therapy as tolerated.  Patient will need a skilled nursing facility upon discharge.  Patient is weightbearing as tolerated on left lower extremity.  Patient on IV heparin.   Juanell Fairly , MD 02/05/2022, 12:47 PM

## 2022-02-05 NOTE — Consult Note (Signed)
ANTICOAGULATION CONSULT NOTE - Initial Consult  Pharmacy Consult for heparin infusion Indication: atrial fibrillation  Allergies  Allergen Reactions   Augmentin [Amoxicillin-Pot Clavulanate] Nausea And Vomiting   Prednisone     Patient Measurements: Height: 5' 5.5" (166.4 cm) Weight: 65 kg (143 lb 4.8 oz) IBW/kg (Calculated) : 58.15 Heparin Dosing Weight: 65 kg  Vital Signs: Temp: 98.5 F (36.9 C) (08/14 1938) BP: 138/56 (08/14 1938) Pulse Rate: 70 (08/14 1938)  Labs: Recent Labs    02/03/22 0307 02/04/22 0312 02/04/22 0614 02/04/22 0617 02/04/22 1243 02/04/22 1750 02/05/22 0200  HGB 10.8* 10.4*  --   --   --   --  10.2*  HCT 33.7* 31.8*  --   --   --   --  31.2*  PLT 200 205  --   --   --   --  232  APTT  --   --  33  --   --   --   --   LABPROT  --   --  14.9  --   --   --   --   INR  --   --  1.2  --   --   --   --   HEPARINUNFRC  --   --   --   --   --  0.37 0.31  CREATININE 0.75 0.82  --   --   --   --  0.77  TROPONINIHS  --   --   --  178* 174*  --   --      Estimated Creatinine Clearance: 41.2 mL/min (by C-G formula based on SCr of 0.77 mg/dL).   Medical History: Past Medical History:  Diagnosis Date   Ankle fracture 10/14/2017   Chronic heart failure with preserved ejection fraction (HFpEF) (HCC)    Heart murmur    Hypertension    LBBB (left bundle branch block)    LVH (left ventricular hypertrophy)    a. 06/2016 Echo: EF 60-65%, no rwma, mild MR. PASP ; b. 01/2021 Echo: EF 55-60%, sev assym septal hypertrophy (LV basal-mid septum up to 1.5cm). No SAM. No rwma. GrIDD. Nl RV fxn. Mild Ao sclerosis.   Palpitations    a. 07/2016 Holter: Avg HR 77 (54-117). 59 isolated PVCs. Rare PACs (<1%) w/ 3 atrial runs - longest 7 beats, fastest 170 bpm x 4 beats - PAT.   PNA (pneumonia) 02/16/2018    Medications:  Scheduled:   cholecalciferol  1,000 Units Oral Q breakfast   docusate sodium  100 mg Oral BID   feeding supplement  237 mL Oral BID BM    furosemide  20 mg Oral Q breakfast   insulin aspart  0-5 Units Subcutaneous QHS   insulin aspart  0-9 Units Subcutaneous TID WC   metoprolol tartrate  12.5 mg Oral Q6H   multivitamin with minerals   Oral Q breakfast   senna  1 tablet Oral BID   traMADol  50 mg Oral Q6H   Infusions:   heparin 950 Units/hr (02/04/22 0914)   methocarbamol (ROBAXIN) IV     PRN: acetaminophen, acetaminophen, alum & mag hydroxide-simeth, bisacodyl, magnesium hydroxide, menthol-cetylpyridinium **OR** phenol, methocarbamol **OR** methocarbamol (ROBAXIN) IV, morphine injection, ondansetron **OR** ondansetron (ZOFRAN) IV, oxyCODONE, polyethylene glycol, traZODone Heparin Dosing Weight: 65 kg  Assessment: [name] who presented to the emergency room with acute onset of accidental mechanical fall with subsequent left hip pain. Currently POD2 after intramedullary fixation for left intertrochanteric  hip fracture. PMH significant for chronic HFpEF, LBBB, HTN complicated by whitecoat syndrome, and T2DM. 02/04/2022 ECG concerning for Afib with RVR and STEMI. 02/04/2022 D-Dimer 0.79, hsTrop 178. Pharmacy has been consulted to initiate and manage heparin infusion.  Baseline Labs: Hgb 10.4, Hct 31.8, Plt 205, PT/INR and aPTT ordered    Date Time  HL Rate/Comment 8/14 1750 0.37 Thera x1; 950 un/hr  8/15     0200    0.31    Thera X 2      Goal of Therapy:  Heparin level 0.3-0.7 units/ml Monitor platelets by anticoagulation protocol: Yes   Plan:  8/15:  HL @ 0200 = 0.31, therapeutic X 2 Will continue pt on current rate and recheck HL on 8/16 with AM labs.   TOC - Plan to initiate anticoagulation with heparin infusion and transition to DOAC prior to discharge.  Laurie Patel D Clinical Pharmacist 02/05/2022 3:00 AM

## 2022-02-06 ENCOUNTER — Inpatient Hospital Stay: Payer: Medicare Other

## 2022-02-06 DIAGNOSIS — I480 Paroxysmal atrial fibrillation: Secondary | ICD-10-CM | POA: Diagnosis not present

## 2022-02-06 DIAGNOSIS — I5032 Chronic diastolic (congestive) heart failure: Secondary | ICD-10-CM | POA: Diagnosis not present

## 2022-02-06 DIAGNOSIS — D62 Acute posthemorrhagic anemia: Secondary | ICD-10-CM | POA: Diagnosis not present

## 2022-02-06 LAB — HEMOGLOBIN AND HEMATOCRIT, BLOOD
HCT: 29.5 % — ABNORMAL LOW (ref 36.0–46.0)
HCT: 27.5 % — ABNORMAL LOW (ref 36.0–46.0)
Hemoglobin: 9.5 g/dL — ABNORMAL LOW (ref 12.0–15.0)
Hemoglobin: 8.9 g/dL — ABNORMAL LOW (ref 12.0–15.0)

## 2022-02-06 LAB — CBC
HCT: 27.3 % — ABNORMAL LOW (ref 36.0–46.0)
Hemoglobin: 8.8 g/dL — ABNORMAL LOW (ref 12.0–15.0)
MCH: 27.4 pg (ref 26.0–34.0)
MCHC: 32.2 g/dL (ref 30.0–36.0)
MCV: 85 fL (ref 80.0–100.0)
Platelets: 225 10*3/uL (ref 150–400)
RBC: 3.21 MIL/uL — ABNORMAL LOW (ref 3.87–5.11)
RDW: 13.4 % (ref 11.5–15.5)
WBC: 15.3 10*3/uL — ABNORMAL HIGH (ref 4.0–10.5)
nRBC: 0 % (ref 0.0–0.2)

## 2022-02-06 LAB — GLUCOSE, CAPILLARY
Glucose-Capillary: 155 mg/dL — ABNORMAL HIGH (ref 70–99)
Glucose-Capillary: 171 mg/dL — ABNORMAL HIGH (ref 70–99)
Glucose-Capillary: 180 mg/dL — ABNORMAL HIGH (ref 70–99)
Glucose-Capillary: 243 mg/dL — ABNORMAL HIGH (ref 70–99)

## 2022-02-06 LAB — HEPARIN LEVEL (UNFRACTIONATED)
Heparin Unfractionated: 0.28 IU/mL — ABNORMAL LOW (ref 0.30–0.70)
Heparin Unfractionated: 0.19 IU/mL — ABNORMAL LOW (ref 0.30–0.70)
Heparin Unfractionated: 0.51 IU/mL (ref 0.30–0.70)

## 2022-02-06 MED ORDER — HEPARIN BOLUS VIA INFUSION
1950.0000 [IU] | Freq: Once | INTRAVENOUS | Status: AC
  Administered 2022-02-06: 1950 [IU] via INTRAVENOUS
  Filled 2022-02-06: qty 1950

## 2022-02-06 MED ORDER — HEPARIN BOLUS VIA INFUSION
950.0000 [IU] | Freq: Once | INTRAVENOUS | Status: AC
  Administered 2022-02-06: 950 [IU] via INTRAVENOUS
  Filled 2022-02-06: qty 950

## 2022-02-06 MED ORDER — TRAMADOL HCL 50 MG PO TABS: 50.0000 mg | ORAL_TABLET | Freq: Four times a day (QID) | ORAL | Status: DC | PRN

## 2022-02-06 NOTE — Assessment & Plan Note (Addendum)
Hemoglobin has trended downward since admission with Hgb of 12.5. Hgb briefly increased to 14 on the day after admission. Postoperatively the patient's hemoglobin has dropped to 10.4. After heparin drip was started it has dropped to 8.8 although most recent H&H was 9.5. The patient denies any black, maroon, or bloody BM's. FOBT is negative. CT of the abdomen and pelvis ruled out retroperitoneal bleed, but did confirm multiple hematomas in the area of the patient's recent ORIF. FOBT was negative. Will continue to monitor hemoglobin. As the patient's hemoglobin has continued to drift down, heparin and antithrombotics have been discontinued by cardiology. They plan to discharge the patient to SNF with a cardiac monitor to determine whether or not stroke prophylaxis is required. The patient is currently in sinus rhythm.

## 2022-02-06 NOTE — Plan of Care (Signed)
  Problem: Education: Goal: Knowledge of General Education information will improve Description: Including pain rating scale, medication(s)/side effects and non-pharmacologic comfort measures Outcome: Progressing   Problem: Health Behavior/Discharge Planning: Goal: Ability to manage health-related needs will improve Outcome: Progressing   Problem: Clinical Measurements: Goal: Ability to maintain clinical measurements within normal limits will improve Outcome: Progressing Goal: Will remain free from infection Outcome: Progressing Goal: Diagnostic test results will improve Outcome: Progressing Goal: Respiratory complications will improve Outcome: Progressing Goal: Cardiovascular complication will be avoided Outcome: Progressing   Problem: Activity: Goal: Risk for activity intolerance will decrease Outcome: Progressing   Problem: Nutrition: Goal: Adequate nutrition will be maintained Outcome: Progressing   Problem: Coping: Goal: Level of anxiety will decrease Outcome: Progressing   Problem: Pain Managment: Goal: General experience of comfort will improve Outcome: Progressing   Problem: Safety: Goal: Ability to remain free from injury will improve Outcome: Progressing   Problem: Skin Integrity: Goal: Risk for impaired skin integrity will decrease Outcome: Progressing   Problem: Nutritional: Goal: Maintenance of adequate nutrition will improve Outcome: Progressing Goal: Progress toward achieving an optimal weight will improve Outcome: Progressing   Problem: Skin Integrity: Goal: Risk for impaired skin integrity will decrease Outcome: Progressing

## 2022-02-06 NOTE — Progress Notes (Signed)
Subjective:  POD #4 s/p IM fixation for left intertrochanteric hip fracture.   Patient reports left hip pain as mild.  Patient's only is at the bedside.  Patient states she got up with physical therapy today and took some steps.  Patient states she continues to feel better each day.  Objective:   VITALS:   Vitals:   02/05/22 1644 02/05/22 2008 02/06/22 0408 02/06/22 0750  BP: 111/82 (!) 129/46 (!) 124/42 (!) 133/52  Pulse: 85 81 68 74  Resp: 16 20 20 15   Temp: 98.5 F (36.9 C) 98.2 F (36.8 C) 98.4 F (36.9 C) 97.8 F (36.6 C)  TempSrc:      SpO2: 98% 96% 100% 99%  Weight:      Height:        PHYSICAL EXAM: Left lower extremity: Patient's proximalmost hip dressing has some mild to moderate serosanguineous drainage after I change the dressings personally yesterday.  She is surrounding ecchymosis in this area.  Her heparin is likely contributing to the drainage. Neurovascular intact Sensation intact distally Intact pulses distally Dorsiflexion/Plantar flexion intact Incision: moderate drainage No cellulitis present Compartment soft  LABS  Results for orders placed or performed during the hospital encounter of 02/01/22 (from the past 24 hour(s))  Glucose, capillary     Status: Abnormal   Collection Time: 02/05/22  4:13 PM  Result Value Ref Range   Glucose-Capillary 287 (H) 70 - 99 mg/dL  Glucose, capillary     Status: Abnormal   Collection Time: 02/05/22 10:05 PM  Result Value Ref Range   Glucose-Capillary 152 (H) 70 - 99 mg/dL  Heparin level (unfractionated)     Status: Abnormal   Collection Time: 02/06/22  4:16 AM  Result Value Ref Range   Heparin Unfractionated 0.28 (L) 0.30 - 0.70 IU/mL  CBC     Status: Abnormal   Collection Time: 02/06/22  4:16 AM  Result Value Ref Range   WBC 15.3 (H) 4.0 - 10.5 K/uL   RBC 3.21 (L) 3.87 - 5.11 MIL/uL   Hemoglobin 8.8 (L) 12.0 - 15.0 g/dL   HCT 02/08/22 (L) 63.1 - 49.7 %   MCV 85.0 80.0 - 100.0 fL   MCH 27.4 26.0 - 34.0 pg    MCHC 32.2 30.0 - 36.0 g/dL   RDW 02.6 37.8 - 58.8 %   Platelets 225 150 - 400 K/uL   nRBC 0.0 0.0 - 0.2 %  Glucose, capillary     Status: Abnormal   Collection Time: 02/06/22  7:55 AM  Result Value Ref Range   Glucose-Capillary 155 (H) 70 - 99 mg/dL  Hemoglobin and hematocrit, blood     Status: Abnormal   Collection Time: 02/06/22 11:39 AM  Result Value Ref Range   Hemoglobin 9.5 (L) 12.0 - 15.0 g/dL   HCT 02/08/22 (L) 77.4 - 12.8 %  Glucose, capillary     Status: Abnormal   Collection Time: 02/06/22 12:02 PM  Result Value Ref Range   Glucose-Capillary 171 (H) 70 - 99 mg/dL    No results found.  Assessment/Plan: 4 Days Post-Op   Principal Problem:   Closed intertrochanteric fracture of hip, left, initial encounter [facility]) Active Problems:   Controlled type 2 diabetes mellitus without complication, without long-term current use of insulin (HCC)   Hyperlipidemia   Chronic diastolic CHF (congestive heart failure) (HCC)   Essential hypertension   Closed fracture of left proximal humerus   Atelectasis   Paroxysmal atrial fibrillation [facility])  Patient is making progress from  an orthopedic standpoint.  Continue with physical therapy.  She is awaiting skilled nursing facility approval.  Patient will need a skilled nursing facility upon discharge.  Patient will need an anticoagulation plan as an outpatient.  She is currently on heparin.  Will discuss with the medical team.  Patient may be discharged to a skilled nursing facility once cleared medically and approval has been obtained.    Juanell Fairly , MD 02/06/2022, 1:29 PM

## 2022-02-06 NOTE — Consult Note (Signed)
ANTICOAGULATION CONSULT NOTE - Initial Consult  Pharmacy Consult for heparin infusion Indication: atrial fibrillation  Allergies  Allergen Reactions   Augmentin [Amoxicillin-Pot Clavulanate] Nausea And Vomiting   Prednisone     Patient Measurements: Height: 5' 5.5" (166.4 cm) Weight: 65 kg (143 lb 4.8 oz) IBW/kg (Calculated) : 58.15 Heparin Dosing Weight: 65 kg  Vital Signs: Temp: 98.4 F (36.9 C) (08/16 0408) BP: 124/42 (08/16 0408) Pulse Rate: 68 (08/16 0408)  Labs: Recent Labs    02/04/22 0312 02/04/22 0614 02/04/22 0617 02/04/22 1243 02/04/22 1750 02/05/22 0200 02/06/22 0416  HGB 10.4*  --   --   --   --  10.2* 8.8*  HCT 31.8*  --   --   --   --  31.2* 27.3*  PLT 205  --   --   --   --  232 225  APTT  --  33  --   --   --   --   --   LABPROT  --  14.9  --   --   --   --   --   INR  --  1.2  --   --   --   --   --   HEPARINUNFRC  --   --   --   --  0.37 0.31 0.28*  CREATININE 0.82  --   --   --   --  0.77  --   TROPONINIHS  --   --  178* 174*  --   --   --      Estimated Creatinine Clearance: 41.2 mL/min (by C-G formula based on SCr of 0.77 mg/dL).   Medical History: Past Medical History:  Diagnosis Date   Ankle fracture 10/14/2017   Chronic heart failure with preserved ejection fraction (HFpEF) (HCC)    Heart murmur    Hypertension    LBBB (left bundle branch block)    LVH (left ventricular hypertrophy)    a. 06/2016 Echo: EF 60-65%, no rwma, mild MR. PASP ; b. 01/2021 Echo: EF 55-60%, sev assym septal hypertrophy (LV basal-mid septum up to 1.5cm). No SAM. No rwma. GrIDD. Nl RV fxn. Mild Ao sclerosis.   Palpitations    a. 07/2016 Holter: Avg HR 77 (54-117). 59 isolated PVCs. Rare PACs (<1%) w/ 3 atrial runs - longest 7 beats, fastest 170 bpm x 4 beats - PAT.   PNA (pneumonia) 02/16/2018    Medications:  Scheduled:   cholecalciferol  1,000 Units Oral Q breakfast   docusate sodium  100 mg Oral BID   feeding supplement  237 mL Oral BID BM    furosemide  20 mg Oral Q breakfast   heparin  950 Units Intravenous Once   insulin aspart  0-5 Units Subcutaneous QHS   insulin aspart  0-9 Units Subcutaneous TID WC   lisinopril  10 mg Oral Daily   metoprolol tartrate  25 mg Oral BID   multivitamin with minerals   Oral Q breakfast   senna  1 tablet Oral BID   traMADol  50 mg Oral Q6H   Infusions:   heparin 950 Units/hr (02/05/22 0525)   methocarbamol (ROBAXIN) IV     PRN: acetaminophen, acetaminophen, alum & mag hydroxide-simeth, bisacodyl, magnesium hydroxide, menthol-cetylpyridinium **OR** phenol, methocarbamol **OR** methocarbamol (ROBAXIN) IV, morphine injection, ondansetron **OR** ondansetron (ZOFRAN) IV, oxyCODONE, polyethylene glycol, traZODone Heparin Dosing Weight: 65 kg  Assessment: [name] who presented to the emergency room with acute onset of  accidental mechanical fall with subsequent left hip pain. Currently POD2 after intramedullary fixation for left intertrochanteric hip fracture. PMH significant for chronic HFpEF, LBBB, HTN complicated by whitecoat syndrome, and T2DM. 02/04/2022 ECG concerning for Afib with RVR and STEMI. 02/04/2022 D-Dimer 0.79, hsTrop 178. Pharmacy has been consulted to initiate and manage heparin infusion.  Baseline Labs: Hgb 10.4, Hct 31.8, Plt 205, PT/INR and aPTT ordered    Date Time  HL Rate/Comment 8/14 1750 0.37 Thera x1; 950 un/hr  8/15     0200    0.31    Thera X 2   8/16     0416    0.28     SUBtherapeutic     Goal of Therapy:  Heparin level 0.3-0.7 units/ml Monitor platelets by anticoagulation protocol: Yes   Plan:  8/16:  HL @ 0416 = 0.28, SUBtherapeutic Will order heparin 950 units IV X 1 bolus and increase drip rate to 1050 units/hr.  Will recheck HL 8 hrs after rate change.   Laurie Patel D Clinical Pharmacist 02/06/2022 5:42 AM

## 2022-02-06 NOTE — Progress Notes (Signed)
PT Cancellation Note  Patient Details Name: Laurie Patel MRN: [id number] DOB: [birth year]   Cancelled Treatment:     PT attempt. Pt politely refused. " Can we please wait to do it early tomorrow morning?" Author will return in the morning to continue to progress pt to PLOF while maximizing independence with ADLs.    Rushie Chestnut 02/06/2022, 3:56 PM

## 2022-02-06 NOTE — NC FL2 (Signed)
[place] MEDICAID [id number] LEVEL OF CARE SCREENING TOOL     IDENTIFICATION  Patient Name: Laurie Patel Birthdate: 08-13-1927 Sex: female Admission Date (Current Location): 02/01/2022  [facility] and [place] Number:  Chiropodist and Address:  [facility], [address], [place], [place] [address]      Provider Number: [id number]  Attending Physician Name and Address:  Fran Lowes, DO  Relative Name and Phone Number:  Hilda Lias PZWC,[phone]    Current Level of Care: Hospital Recommended Level of Care: Skilled Nursing Facility Prior Approval Number:    Date Approved/Denied:   PASRR Number: [id number]  Discharge Plan: SNF    Current Diagnoses: Patient Active Problem List   Diagnosis Date Noted   Acute posthemorrhagic anemia 02/06/2022   Atelectasis 02/04/2022   Paroxysmal atrial fibrillation (HCC) 02/04/2022   Essential hypertension 02/02/2022   Closed fracture of left proximal humerus 02/02/2022   Closed intertrochanteric fracture of hip, left, initial encounter (HCC) 02/01/2022   Chronic diastolic CHF (congestive heart failure) (HCC) 01/15/2022   History of tick bite of right lower leg 01/15/2022   Constipation 04/18/2020   Elevated hemoglobin (HCC) 04/18/2020   Bilateral hand numbness 03/14/2020   Skin lesions 11/09/2019   Ear discomfort, right 05/12/2018   Dermatitis, eczematoid 02/16/2018   H/O: osteoarthritis 02/16/2018   Irregular cardiac rhythm 02/16/2018   Low back pain 02/16/2018   Nonspecific abnormal finding in stool contents 02/16/2018   Balance problem 10/14/2017   Palpitations 06/21/2016   Hyperlipidemia 03/21/2016   Allergic rhinitis 03/21/2016   Controlled type 2 diabetes mellitus without complication, without long-term current use of insulin (HCC) 09/14/2015   HTN (hypertension) 08/27/2014   Breathlessness on exertion 10/05/2009   OP (osteoporosis) 09/04/2009    Orientation RESPIRATION BLADDER Height & Weight      Self, Time, Situation, Place  Normal Incontinent Weight: 65 kg Height:  5' 5.5" (166.4 cm)  BEHAVIORAL SYMPTOMS/MOOD NEUROLOGICAL BOWEL NUTRITION STATUS     (n/a) Continent Diet  AMBULATORY STATUS COMMUNICATION OF NEEDS Skin   Limited Assist Verbally Other (Comment) (Ecchymosis bilateral arms)                       Personal Care Assistance Level of Assistance  Bathing, Dressing Bathing Assistance: Limited assistance   Dressing Assistance: Limited assistance     Functional Limitations Info  Hearing, Speech, Sight Sight Info: Impaired Hearing Info: Impaired Speech Info: Adequate    SPECIAL CARE FACTORS FREQUENCY  PT (By licensed PT), OT (By licensed OT)     PT Frequency: 3x week OT Frequency: 3x week            Contractures Contractures Info: Not present    Additional Factors Info  Code Status, Allergies Code Status Info: FULL Allergies Info: Augmentin (Amoxicillin-pot Clavulanate), Prednisone   Insulin Sliding Scale Info: insulin aspart (novoLOG) injection 0-5 Units at bedtime, insulin aspart (novoLOG) injection 0-9 Units with meals       Current Medications (02/06/2022):  This is the current hospital active medication list Current Facility-Administered Medications  Medication Dose Route Frequency Provider Last Rate Last Admin   acetaminophen (TYLENOL) suppository 650 mg  650 mg Rectal Q4H PRN Juanell Fairly, MD       acetaminophen (TYLENOL) tablet 650 mg  650 mg Oral Q4H PRN Juanell Fairly, MD       alum & mag hydroxide-simeth (MAALOX/MYLANTA) 200-200-20 MG/5ML suspension 30 mL  30 mL Oral Q4H PRN Juanell Fairly,  MD   30 mL at 02/03/22 0815   bisacodyl (DULCOLAX) suppository 10 mg  10 mg Rectal Daily PRN Juanell Fairly, MD       cholecalciferol (VITAMIN D3) 25 MCG (1000 UNIT) tablet 1,000 Units  1,000 Units Oral Q breakfast Juanell Fairly, MD   1,000 Units at 02/06/22 0925   docusate sodium (COLACE) capsule 100 mg  100 mg Oral BID Juanell Fairly, MD   100 mg at 02/04/22 2128   feeding supplement (ENSURE ENLIVE / ENSURE PLUS) liquid 237 mL  237 mL Oral BID BM Alberteen Sam, MD   237 mL at 02/06/22 1524   furosemide (LASIX) tablet 20 mg  20 mg Oral Q breakfast Juanell Fairly, MD   20 mg at 02/06/22 0925   heparin ADULT infusion 100 units/mL (25000 units/243mL)  1,250 Units/hr Intravenous Continuous Coulter, Eber Jones, RPH 12.5 mL/hr at 02/06/22 1537 1,250 Units/hr at 02/06/22 1537   insulin aspart (novoLOG) injection 0-5 Units  0-5 Units Subcutaneous QHS Juanell Fairly, MD   2 Units at 02/02/22 2235   insulin aspart (novoLOG) injection 0-9 Units  0-9 Units Subcutaneous TID WC Juanell Fairly, MD   2 Units at 02/06/22 1237   lisinopril (ZESTRIL) tablet 10 mg  10 mg Oral Daily Antonieta Iba, MD   10 mg at 02/06/22 3244   magnesium hydroxide (MILK OF MAGNESIA) suspension 30 mL  30 mL Oral Daily PRN Juanell Fairly, MD       menthol-cetylpyridinium (CEPACOL) lozenge 3 mg  1 lozenge Oral PRN Juanell Fairly, MD       Or   phenol (CHLORASEPTIC) mouth spray 1 spray  1 spray Mouth/Throat PRN Juanell Fairly, MD       methocarbamol (ROBAXIN) tablet 500 mg  500 mg Oral Q6H PRN Juanell Fairly, MD       Or   methocarbamol (ROBAXIN) 500 mg in dextrose 5 % 50 mL IVPB  500 mg Intravenous Q6H PRN Juanell Fairly, MD       metoprolol tartrate (LOPRESSOR) tablet 25 mg  25 mg Oral BID Hammock, Lavonna Rua, NP   25 mg at 02/06/22 0102   morphine (PF) 2 MG/ML injection 0.5 mg  0.5 mg Intravenous Q2H PRN Juanell Fairly, MD       multivitamin with minerals tablet   Oral Q breakfast Juanell Fairly, MD   Given at 02/06/22 0925   ondansetron (ZOFRAN) tablet 4 mg  4 mg Oral Q6H PRN Juanell Fairly, MD   4 mg at 02/03/22 1100   Or   ondansetron (ZOFRAN) injection 4 mg  4 mg Intravenous Q6H PRN Juanell Fairly, MD   4 mg at 02/04/22 1344   oxyCODONE (Oxy IR/ROXICODONE) immediate release tablet 5-10 mg  5-10 mg Oral Q4H PRN Juanell Fairly,  MD       polyethylene glycol (MIRALAX / GLYCOLAX) packet 17 g  17 g Oral Daily PRN Juanell Fairly, MD       traMADol Janean Sark) tablet 50 mg  50 mg Oral Q6H PRN Swayze, Ava, DO       traZODone (DESYREL) tablet 25 mg  25 mg Oral QHS PRN Juanell Fairly, MD         Discharge Medications: Please see discharge summary for a list of discharge medications.  Relevant Imaging Results:  Relevant Lab Results:   Additional Information SS# [id number]  Truddie Hidden, RN

## 2022-02-06 NOTE — Progress Notes (Signed)
Physical Therapy Treatment Patient Details Name: Laurie Patel MRN: [id number] DOB: 1927/11/03 Today's Date: 02/06/2022   History of Present Illness Laurie Patel. Rideaux is a 88 y.o. female who presented to the ER secondary to mechanical fall in home environment, acute onset of L hip, L shoulder pain; admitted for management of L comminuted intertrochanteric hip fracture s/p ORIF (02/02/22), L proximal humeral fx, non-operative, LUE to remain immobilized/NWB Per MD.    PT Comments    Pt reported fatigue at beginning of session but willing to work with PTA.  Attempted sit<>stands with [facility], pt unable to make transition.  Pt aware of WB restrictions and worked on maintaining them during initial attempts and then with successful sit<>stand with Nursing present for +2 Assist for transfer to [facility] .  Pt transferred to commode with RW in front attempting to maintain LUE NWB but had difficulty.  This PTA recommends attempting again with [facility] or Hemi walker to assist pt in maintaining WB status. Current D/C plan is still appropriate.  Recommendations for follow up therapy are one component of a multi-disciplinary discharge planning process, led by the attending physician.  Recommendations may be updated based on patient status, additional functional criteria and insurance authorization.  Follow Up Recommendations  Skilled nursing-short term rehab (<3 hours/day) Can patient physically be transported by private vehicle: No   Assistance Recommended at Discharge Frequent or constant Supervision/Assistance  Patient can return home with the following Two people to help with walking and/or transfers;Two people to help with bathing/dressing/bathroom;Assistance with cooking/housework;Help with stairs or ramp for entrance;Assist for transportation   Equipment Recommendations       Recommendations for Other Services       Precautions / Restrictions Precautions Precautions: Fall Required Braces or Orthoses:  Sling Restrictions Weight Bearing Restrictions: Yes LUE Weight Bearing: Non weight bearing LLE Weight Bearing: Weight bearing as tolerated     Mobility  Bed Mobility Overal bed mobility: Needs Assistance             General bed mobility comments: Pt received in chair    Transfers Overall transfer level: Needs assistance Equipment used: Rolling walker (2 wheels) Transfers: Sit to/from Stand, Bed to chair/wheelchair/BSC Sit to Stand: +2 physical assistance Stand pivot transfers: +2 physical assistance         General transfer comment: stand pivot to commode, pt attempting to not weight bear through L UE, cues for L UE NWB.    Ambulation/Gait               General Gait Details: unsafe/unable to maintain LUE NWB status.   Stairs             Wheelchair Mobility    Modified Rankin (Stroke Patients Only)       Balance Overall balance assessment: Needs assistance Sitting-balance support: No upper extremity supported, Feet supported Sitting balance-Leahy Scale: Good     Standing balance support: Reliant on assistive device for balance Standing balance-Leahy Scale: Poor                              Cognition Arousal/Alertness: Awake/alert Behavior During Therapy: WFL for tasks assessed/performed Overall Cognitive Status: No family/caregiver present to determine baseline cognitive functioning                                 General Comments: Decreased safety awarenss, decreased recall of  precautions and NWB status in LUE. Decreased awareness of deficits with functional use of LUE and LLE.        Exercises Total Joint Exercises Ankle Circles/Pumps: AROM, Both, 20 reps Gluteal Sets: AROM, Both, 10 reps Towel Squeeze: AROM, Both, 10 reps Long Arc Quad: AROM, Both, 10 reps Knee Flexion: AROM, Left, 10 reps    General Comments        Pertinent Vitals/Pain Pain Assessment Pain Assessment: No/denies pain Pain  Location: Endorses mild "stiffness" in L hip. Otherwise, no pain. Pain Descriptors / Indicators: Aching, Grimacing    Home Living                          Prior Function            PT Goals (current goals can now be found in the care plan section) Acute Rehab PT Goals Patient Stated Goal: to get back home PT Goal Formulation: With patient Time For Goal Achievement: 02/17/22 Potential to Achieve Goals: Good Progress towards PT goals: Progressing toward goals    Frequency    BID      PT Plan Current plan remains appropriate    Co-evaluation              AM-PAC PT "6 Clicks" Mobility   Outcome Measure  Help needed turning from your back to your side while in a flat bed without using bedrails?: A Lot Help needed moving from lying on your back to sitting on the side of a flat bed without using bedrails?: A Lot Help needed moving to and from a bed to a chair (including a wheelchair)?: Total Help needed standing up from a chair using your arms (e.g., wheelchair or bedside chair)?: Total Help needed to walk in hospital room?: Total Help needed climbing 3-5 steps with a railing? : Total 6 Click Score: 8    End of Session Equipment Utilized During Treatment: Gait belt Activity Tolerance: Patient tolerated treatment well Patient left: with nursing/sitter in room (Nurse in room with pt on [facility]) Nurse Communication: Mobility status PT Visit Diagnosis: Muscle weakness (generalized) (M62.81);Other abnormalities of gait and mobility (R26.89);Pain Pain - Right/Left: Left Pain - part of body: Hip     Time: 1030-1100 PT Time Calculation (min) (ACUTE ONLY): 30 min  Charges:  $Therapeutic Exercise: 8-22 mins $Therapeutic Activity: 8-22 mins                     Hortencia Conradi, PTA  02/06/22, 12:08 PM

## 2022-02-06 NOTE — Progress Notes (Signed)
PROGRESS NOTE  Laurie Patel BZJ:[id number] DOB: 02-01-1928 DOA: 02/01/2022 PCP: Glori Luis, MD  Brief History   [name] is a 88 y.o. F with HTN, DM, dCHF, and hx LBBB who presented with a mechanical fall and left hip pain. The patient underwent ORIF of the left hip on 02/03/2022. She has tolerated the procedure well.   In the early morning hours of 02/04/2022 the patient went into atrial fibrillation with RVR. Cardiology was consulted. The patient's heart rate has converted to sinus rhythm on lopressor. She was started on a heparin drip for stroke prophylaxis.  The patient's hemoglobin has dropped quite a bit since admission and particularly after heparin drip was started. Hemoglobin has trended downward since admission with Hgb of 12.5. Hgb briefly increased to 14 on the day after admission. Postoperatively the patient's hemoglobin has dropped to 10.4. After heparin drip was started it has dropped to 8.8 although most recent H&H was 9.5. The patient denies any black, maroon, or bloody BM's. FOBT is pending. CT of the abdomen and pelvis ruled out retroperitoneal bleed, but did confirm multiple hematomas in the area of the patient's recent ORIF. Will continue to monitor hemoglobin. Outcome of this observation is central to the decision whether or not to discharge the patient on coumadin for stroke prophylaxis for her atrial fibrillation.   Consultants  Orthopedic surgery Cardiology  Procedures  ORIF Left hip.  Antibiotics   Anti-infectives (From admission, onward)    Start     Dose/Rate Route Frequency Ordered Stop   02/02/22 1700  ceFAZolin (ANCEF) IVPB 2g/100 mL premix        2 g 200 mL/hr over 30 Minutes Intravenous Every 6 hours 02/02/22 1424 02/02/22 2249   02/02/22 1016  ceFAZolin (ANCEF) 2-4 GM/100ML-% IVPB       Note to Pharmacy: Jenetta Downer: cabinet override      02/02/22 1016 02/03/22 1000   02/02/22 0000  ceFAZolin (ANCEF) IVPB 2g/100 mL premix        2  g 200 mL/hr over 30 Minutes Intravenous 30 min pre-op 02/01/22 2347 02/02/22 1122      Subjective  The patient is resting comfortably. No new complaints.   Objective   Vitals:  Vitals:   02/06/22 0408 02/06/22 0750  BP: (!) 124/42 (!) 133/52  Pulse: 68 74  Resp: 20 15  Temp: 98.4 F (36.9 C) 97.8 F (36.6 C)  SpO2: 100% 99%    Exam:  Constitutional:  The patient is awake, alert, and oriented x 3. No acute distress. Respiratory:  No increased work of breathing. No wheezes, rales, or rhonchi No tactile fremitus Cardiovascular:  Regular rate and rhythm No murmurs, ectopy, or gallups. No lateral PMI. No thrills. Abdomen:  Abdomen is soft, non-tender, non-distended No hernias, masses, or organomegaly Normoactive bowel sounds.  Musculoskeletal:  No cyanosis, clubbing, or edema Skin:  No rashes, lesions, ulcers palpation of skin: no induration or nodules Neurologic:  CN 2-12 intact Sensation all 4 extremities intact Psychiatric:  Mental status Mood, affect appropriate Orientation to person, place, time  judgment and insight appear intact  I have personally reviewed the following:   Today's Data  Vitals  Lab Data  CBC BMP  Micro Data    Imaging  CT abdomen and pelvis  Cardiology Data  EKG Echocardiogram  Other Data    Scheduled Meds:  cholecalciferol  1,000 Units Oral Q breakfast   docusate sodium  100 mg Oral BID   feeding supplement  237 mL Oral BID BM   furosemide  20 mg Oral Q breakfast   insulin aspart  0-5 Units Subcutaneous QHS   insulin aspart  0-9 Units Subcutaneous TID WC   lisinopril  10 mg Oral Daily   metoprolol tartrate  25 mg Oral BID   multivitamin with minerals   Oral Q breakfast   Continuous Infusions:  heparin 1,050 Units/hr (02/06/22 0554)   methocarbamol (ROBAXIN) IV      Principal Problem:   Closed intertrochanteric fracture of hip, left, initial encounter (HCC) Active Problems:   Essential hypertension    Chronic diastolic CHF (congestive heart failure) (HCC)   Acute posthemorrhagic anemia   Controlled type 2 diabetes mellitus without complication, without long-term current use of insulin (HCC)   Hyperlipidemia   Closed fracture of left proximal humerus   Atelectasis   Paroxysmal atrial fibrillation (HCC)   LOS: 5 days   A & P  Assessment and Plan: * Closed intertrochanteric fracture of hip, left, initial encounter (HCC) S/p Intramedullary fixation for left intertrochanteric hip fracturby Dr. Martha Clan.  Vit D 52. - PT eval    Essential hypertension - Stop lisinopril - Continue furosemide - Continue new metoprolol for Afib _ The patient is currently normotensive.  Chronic diastolic CHF (congestive heart failure) (HCC) Appears euvolemic - Continue Lasix  Acute posthemorrhagic anemia Hemoglobin has trended downward since admission with Hgb of 12.5. Hgb briefly increased to 14 on the day after admission. Postoperatively the patient's hemoglobin has dropped to 10.4. After heparin drip was started it has dropped to 8.8 although most recent H&H was 9.5. The patient denies any black, maroon, or bloody BM's. FOBT is pending. CT of the abdomen and pelvis ruled out retroperitoneal bleed, but did confirm multiple hematomas in the area of the patient's recent ORIF. Will continue to monitor hemoglobin. Outcome of this observation is central to the decision whether or not to discharge the patient on coumadin for stroke prophylaxis for her atrial fibrillation.  Paroxysmal atrial fibrillation (HCC) New onset 8/14 postoperatively.  Cardiology were consulted, they recommended anticoagulation and started metoprolol. - Continue heparin drip for now --Transition to DOAC at d/c (pt expressed unable to afford if not fully pd by Medicare, cardiology TOC and pharmacy aware) - Trend troponins - Continue metoprolol  Atelectasis Hypoxia noted here.  This is from atelectasis and hypopnea from opiates.  CTA  chest ruled out PE, pneumonia, edema.  Her abdomen (which is her baseline she claims, so I do not suspect ascites) is contributing. - IS, mobilization - Wean O2  Closed fracture of left proximal humerus - Nonweightbearing to left upper extremity - Maintain left arm in sling -Outpatient orthopedics follow-up  Hyperlipidemia Not on a statin, presumably due to age  Controlled type 2 diabetes mellitus without complication, without long-term current use of insulin (HCC) This is diet controlled.  She had mild hyperglycemia here.  Last hemoglobin A1c was 7.9%.   - Continue SS corrections   I have seen and examined this patient myself. I have spent 38 minutes in her evaluation and care.  DVT prophylaxis: Heparin drip Code Status: Full Code Family Communication: None available Disposition Plan: CNF    Maurio Baize, DO [facility] Direct contact: see [email or web address]  7PM-7AM contact night coverage as above 02/06/2022, 2:49 PM  LOS: 5 days

## 2022-02-06 NOTE — Consult Note (Signed)
ANTICOAGULATION CONSULT NOTE - Initial Consult  Pharmacy Consult for heparin infusion Indication: atrial fibrillation  Allergies  Allergen Reactions   Augmentin [Amoxicillin-Pot Clavulanate] Nausea And Vomiting   Prednisone     Patient Measurements: Height: 5' 5.5" (166.4 cm) Weight: 65 kg (143 lb 4.8 oz) IBW/kg (Calculated) : 58.15 Heparin Dosing Weight: 65 kg  Vital Signs: Temp: 97.8 F (36.6 C) (08/16 0750) BP: 133/52 (08/16 0750) Pulse Rate: 74 (08/16 0750)  Labs: Recent Labs    02/04/22 0312 02/04/22 0614 02/04/22 0617 02/04/22 1243 02/04/22 1750 02/05/22 0200 02/06/22 0416 02/06/22 1139 02/06/22 1338  HGB 10.4*  --   --   --   --  10.2* 8.8* 9.5*  --   HCT 31.8*  --   --   --   --  31.2* 27.3* 29.5*  --   PLT 205  --   --   --   --  232 225  --   --   APTT  --  33  --   --   --   --   --   --   --   LABPROT  --  14.9  --   --   --   --   --   --   --   INR  --  1.2  --   --   --   --   --   --   --   HEPARINUNFRC  --   --   --   --    < > 0.31 0.28*  --  0.19*  CREATININE 0.82  --   --   --   --  0.77  --   --   --   TROPONINIHS  --   --  178* 174*  --   --   --   --   --    < > = values in this interval not displayed.     Estimated Creatinine Clearance: 41.2 mL/min (by C-G formula based on SCr of 0.77 mg/dL).   Medical History: Past Medical History:  Diagnosis Date   Ankle fracture 10/14/2017   Chronic heart failure with preserved ejection fraction (HFpEF) (HCC)    Heart murmur    Hypertension    LBBB (left bundle branch block)    LVH (left ventricular hypertrophy)    a. 06/2016 Echo: EF 60-65%, no rwma, mild MR. PASP ; b. 01/2021 Echo: EF 55-60%, sev assym septal hypertrophy (LV basal-mid septum up to 1.5cm). No SAM. No rwma. GrIDD. Nl RV fxn. Mild Ao sclerosis.   Palpitations    a. 07/2016 Holter: Avg HR 77 (54-117). 59 isolated PVCs. Rare PACs (<1%) w/ 3 atrial runs - longest 7 beats, fastest 170 bpm x 4 beats - PAT.   PNA (pneumonia)  02/16/2018    Medications:  Scheduled:   cholecalciferol  1,000 Units Oral Q breakfast   docusate sodium  100 mg Oral BID   feeding supplement  237 mL Oral BID BM   furosemide  20 mg Oral Q breakfast   insulin aspart  0-5 Units Subcutaneous QHS   insulin aspart  0-9 Units Subcutaneous TID WC   lisinopril  10 mg Oral Daily   metoprolol tartrate  25 mg Oral BID   multivitamin with minerals   Oral Q breakfast   Infusions:   heparin 1,050 Units/hr (02/06/22 0554)   methocarbamol (ROBAXIN) IV     PRN: acetaminophen, acetaminophen, alum & mag hydroxide-simeth, bisacodyl, magnesium hydroxide,  menthol-cetylpyridinium **OR** phenol, methocarbamol **OR** methocarbamol (ROBAXIN) IV, morphine injection, ondansetron **OR** ondansetron (ZOFRAN) IV, oxyCODONE, polyethylene glycol, traMADol, traZODone Heparin Dosing Weight: 65 kg  Assessment: [name] who presented to the emergency room with acute onset of accidental mechanical fall with subsequent left hip pain. Currently POD2 after intramedullary fixation for left intertrochanteric hip fracture. PMH significant for chronic HFpEF, LBBB, HTN complicated by whitecoat syndrome, and T2DM. 02/04/2022 ECG concerning for Afib with RVR and STEMI. 02/04/2022 D-Dimer 0.79, hsTrop 178.  Pharmacy has been consulted to initiate and manage heparin infusion.   Patient had a decrease in Hgb from 10.2 on 02/05/2022 to 8.8 on 02/06/2022 with AM labs. Currently trending H&H Q8H. Hgb improving. Continue to monitor closely and watch for signs and symptoms of bleeding.  Baseline Labs: Hgb 10.4, Hct 31.8, Plt 205, PT 14.9, INR 1.2, aPTT 33    Date Time  HL Rate/Comment 8/14 1750 0.37 Therapeutic x1; 950 un/hr  8/15     0200 0.31     Therapeutic x2   8/16     0416 0.28     SUBtherapeutic  8/16  1338 0.19     SUBtherapeutic    Goal of Therapy:  Heparin level 0.3-0.7 units/ml Monitor platelets by anticoagulation protocol: Yes   Plan:  8/16 HL @ 0416 =  0.19, SUBtherapeutic Give heparin bolus of 1950 units IV x1 and increase drip rate to 1250 units/hr.  Check HL 8 hrs after rate change.  Monitor CBC daily while on heparin infusion.  Celene Squibb, PharmD PGY1 Pharmacy Resident 02/06/2022 2:31 PM

## 2022-02-06 NOTE — Progress Notes (Signed)
Progress Note  Patient Name: Laurie Patel Date of Encounter: 02/06/2022  [facility] Cardiologist: Debbe Odea, MD   Subjective   Patient seen on AM rounds. Denies any chest pain or shortness of breath. Sitting up in the recliner eating breakfast. No longer wearing oxygen. Remains in sinus. Remains on heparin drip. Drop in hgb to 8.8 this morning.  Inpatient Medications    Scheduled Meds:  cholecalciferol  1,000 Units Oral Q breakfast   docusate sodium  100 mg Oral BID   feeding supplement  237 mL Oral BID BM   furosemide  20 mg Oral Q breakfast   insulin aspart  0-5 Units Subcutaneous QHS   insulin aspart  0-9 Units Subcutaneous TID WC   lisinopril  10 mg Oral Daily   metoprolol tartrate  25 mg Oral BID   multivitamin with minerals   Oral Q breakfast   senna  1 tablet Oral BID   traMADol  50 mg Oral Q6H   Continuous Infusions:  heparin 1,050 Units/hr (02/06/22 0554)   methocarbamol (ROBAXIN) IV     PRN Meds: acetaminophen, acetaminophen, alum & mag hydroxide-simeth, bisacodyl, magnesium hydroxide, menthol-cetylpyridinium **OR** phenol, methocarbamol **OR** methocarbamol (ROBAXIN) IV, morphine injection, ondansetron **OR** ondansetron (ZOFRAN) IV, oxyCODONE, polyethylene glycol, traZODone   Vital Signs    Vitals:   02/05/22 1644 02/05/22 2008 02/06/22 0408 02/06/22 0750  BP: 111/82 (!) 129/46 (!) 124/42 (!) 133/52  Pulse: 85 81 68 74  Resp: 16 20 20 15   Temp: 98.5 F (36.9 C) 98.2 F (36.8 C) 98.4 F (36.9 C) 97.8 F (36.6 C)  TempSrc:      SpO2: 98% 96% 100% 99%  Weight:      Height:        Intake/Output Summary (Last 24 hours) at 02/06/2022 1102 Last data filed at 02/06/2022 0400 Gross per 24 hour  Intake 437.75 ml  Output 600 ml  Net -162.25 ml      02/01/2022    6:19 PM 01/15/2022    8:52 AM 10/11/2021   11:13 AM  Last 3 Weights  Weight (lbs) 143 lb 4.8 oz 144 lb 9.6 oz 146 lb  Weight (kg) 65 kg 65.59 kg 66.225 kg      Telemetry      Sinus rates 70-80, chronic LBBB with occasional  PAC's- Personally Reviewed  ECG    No new tracings - Personally Reviewed  Physical Exam   GEN: No acute distress.   Neck: No JVD Cardiac: RRR, no murmurs, rubs, or gallops.  Respiratory: Clear to auscultation bilaterally. Respirations are unlabored at rest on room air. GI: Soft, nontender, non-distended  MS: No edema; No deformity. Neuro:  Nonfocal  Psych: Normal affect   Labs    High Sensitivity Troponin:   Recent Labs  Lab 02/04/22 0617 02/04/22 1243  TROPONINIHS 178* 174*     Chemistry Recent Labs  Lab 02/03/22 0307 02/04/22 0312 02/05/22 0200  NA 138 136 135  K 4.1 4.0 3.8  CL 103 99 99  CO2 30 29 28   GLUCOSE 182* 210* 219*  BUN 21 29* 33*  CREATININE 0.75 0.82 0.77  CALCIUM 9.0 9.3 9.2  GFRNONAA >60 >60 >60  ANIONGAP 5 8 8     Lipids No results for input(s): "CHOL", "TRIG", "HDL", "LABVLDL", "LDLCALC", "CHOLHDL" in the last 168 hours.  Hematology Recent Labs  Lab 02/04/22 0312 02/05/22 0200 02/06/22 0416  WBC 18.5* 20.2* 15.3*  RBC 3.76* 3.73* 3.21*  HGB 10.4* 10.2* 8.8*  HCT 31.8* 31.2* 27.3*  MCV 84.6 83.6 85.0  MCH 27.7 27.3 27.4  MCHC 32.7 32.7 32.2  RDW 13.6 13.6 13.4  PLT 205 232 225   Thyroid  Recent Labs  Lab 02/05/22 0200  TSH 1.308    BNP Recent Labs  Lab 02/04/22 0618  BNP 483.6*    DDimer  Recent Labs  Lab 02/04/22 0614  DDIMER 0.79*     Radiology     Cardiac Studies  Echocardiogram 02/04/2022 1. Left ventricular ejection fraction, by estimation, is 60 to 65%. The  left ventricle has normal function. Left ventricular endocardial border  not optimally defined to evaluate regional wall motion. There is moderate  asymmetric left ventricular  hypertrophy of the septal segment. Left ventricular diastolic parameters  are indeterminate.   2. Right ventricular systolic function is normal. The right ventricular  size is normal. There is moderately elevated pulmonary artery  systolic  pressure.   3. Left atrial size was mildly dilated.   4. The mitral valve is abnormal. No evidence of mitral valve  regurgitation.   5. Tricuspid valve regurgitation is mild to moderate.   6. The aortic valve is tricuspid. Aortic valve regurgitation is not  visualized. No aortic stenosis is present.   7. The inferior vena cava is normal in size with <50% respiratory  variability, suggesting right atrial pressure of 8 mmHg.   Patient Profile     88 y.o. female with a history of chronic HFpEF, chronic left bundle branch block, hypertension complicated by white coat syndrome, and type 2 diabetes, who is being seen and evaluated for abnormal EKG concerning for a STEMI, noted to be in atrial fibrillation.  Assessment & Plan    New onset atrial fibrillation with RVR - currently sinus - on heparin drip - plan to transition to DOAC prior to discharge - continue metoprolol 25 mg bid - CHADS-VASc of at least 5  Elevated Hs troponins/ demand ischemia - coronary artery calcification noted on CT scan, not having any concerning symptoms such as angina or dyspnea - continues to deny anginal symptoms - follow up outpatient for consideration of ischemic work-up, stress testing vs cardiac CTA -chronic LBBB noted on EKG - continue cardiac monitor - EKG PRN pain or changes  Chronic HFpEF - echo revealed LVEF 60-65% - trace bilateral pleural effusions noted on CT scan - continued on low dose furosemide - daily weight/I&O/low sodium diet  Hypertension - blood pressure 133/52 - continue furosemide, lisinopril, and metoprolol  - vital per un it protocol  Hip fracture s/p repair - management per ortho and IM  6.   Anemia - hgb 8.8 this morning - trended hgb 14, 10.8, 10.4, 10.2, 8.8 - recommend abdominal CT to rule out retroperitoneal bleed - daily cbc - recommend transfuse for less that 8 hgb     For questions or updates, please contact [facility] Please consult  [email or web address] for contact info under        Signed, Trayveon Beckford, NP  02/06/2022, 11:02 AM

## 2022-02-06 NOTE — Plan of Care (Signed)
  Problem: Education: Goal: Knowledge of General Education information will improve Description: Including pain rating scale, medication(s)/side effects and non-pharmacologic comfort measures Outcome: Progressing   Problem: Activity: Goal: Risk for activity intolerance will decrease Outcome: Progressing   Problem: Nutrition: Goal: Adequate nutrition will be maintained Outcome: Progressing   Problem: Coping: Goal: Level of anxiety will decrease Outcome: Progressing   Problem: Elimination: Goal: Will not experience complications related to bowel motility Outcome: Progressing Goal: Will not experience complications related to urinary retention Outcome: Progressing   Problem: Safety: Goal: Ability to remain free from injury will improve Outcome: Progressing   Problem: Skin Integrity: Goal: Risk for impaired skin integrity will decrease Outcome: Progressing   

## 2022-02-07 DIAGNOSIS — I5032 Chronic diastolic (congestive) heart failure: Secondary | ICD-10-CM | POA: Diagnosis not present

## 2022-02-07 DIAGNOSIS — I480 Paroxysmal atrial fibrillation: Secondary | ICD-10-CM | POA: Diagnosis not present

## 2022-02-07 DIAGNOSIS — S42202A Unspecified fracture of upper end of left humerus, initial encounter for closed fracture: Secondary | ICD-10-CM | POA: Diagnosis not present

## 2022-02-07 DIAGNOSIS — D500 Iron deficiency anemia secondary to blood loss (chronic): Secondary | ICD-10-CM

## 2022-02-07 LAB — HEMOGLOBIN AND HEMATOCRIT, BLOOD
HCT: 28.5 % — ABNORMAL LOW (ref 36.0–46.0)
HCT: 28.4 % — ABNORMAL LOW (ref 36.0–46.0)
Hemoglobin: 9.1 g/dL — ABNORMAL LOW (ref 12.0–15.0)
Hemoglobin: 9.2 g/dL — ABNORMAL LOW (ref 12.0–15.0)

## 2022-02-07 LAB — GLUCOSE, CAPILLARY
Glucose-Capillary: 162 mg/dL — ABNORMAL HIGH (ref 70–99)
Glucose-Capillary: 195 mg/dL — ABNORMAL HIGH (ref 70–99)
Glucose-Capillary: 172 mg/dL — ABNORMAL HIGH (ref 70–99)
Glucose-Capillary: 220 mg/dL — ABNORMAL HIGH (ref 70–99)
Glucose-Capillary: 149 mg/dL — ABNORMAL HIGH (ref 70–99)

## 2022-02-07 LAB — CBC
HCT: 26.3 % — ABNORMAL LOW (ref 36.0–46.0)
Hemoglobin: 8.5 g/dL — ABNORMAL LOW (ref 12.0–15.0)
MCH: 27.5 pg (ref 26.0–34.0)
MCHC: 32.3 g/dL (ref 30.0–36.0)
MCV: 85.1 fL (ref 80.0–100.0)
Platelets: 226 10*3/uL (ref 150–400)
RBC: 3.09 MIL/uL — ABNORMAL LOW (ref 3.87–5.11)
RDW: 13.9 % (ref 11.5–15.5)
WBC: 13.9 10*3/uL — ABNORMAL HIGH (ref 4.0–10.5)
nRBC: 0 % (ref 0.0–0.2)

## 2022-02-07 LAB — HEPARIN LEVEL (UNFRACTIONATED): Heparin Unfractionated: 0.52 IU/mL (ref 0.30–0.70)

## 2022-02-07 LAB — OCCULT BLOOD X 1 CARD TO LAB, STOOL: Fecal Occult Bld: NEGATIVE

## 2022-02-07 MED ORDER — METOPROLOL TARTRATE 25 MG PO TABS
25.0000 mg | ORAL_TABLET | Freq: Once | ORAL | Status: AC
Start: 2022-02-07 — End: 2022-02-07
  Administered 2022-02-07: 25 mg via ORAL
  Filled 2022-02-07: qty 1

## 2022-02-07 MED ORDER — METOPROLOL TARTRATE 50 MG PO TABS
50.0000 mg | ORAL_TABLET | Freq: Two times a day (BID) | ORAL | Status: DC
  Administered 2022-02-08: 50 mg via ORAL
  Filled 2022-02-07 (×2): qty 1

## 2022-02-07 NOTE — Progress Notes (Addendum)
Progress Note  Patient Name: Laurie Patel Date of Encounter: 02/07/2022  [facility] Cardiologist: Debbe Odea, MD   Subjective   Sitting up in recliner this morning, no complaints Left hip feels stiff otherwise no significant pain Up words her balance is not good Trend down on her hemoglobin 8.5 Baseline hemoglobin 10.8 CT scan abdomen results reviewed in detail, small hematomas  Inpatient Medications    Scheduled Meds:  cholecalciferol  1,000 Units Oral Q breakfast   docusate sodium  100 mg Oral BID   feeding supplement  237 mL Oral BID BM   furosemide  20 mg Oral Q breakfast   insulin aspart  0-5 Units Subcutaneous QHS   insulin aspart  0-9 Units Subcutaneous TID WC   lisinopril  10 mg Oral Daily   metoprolol tartrate  25 mg Oral BID   multivitamin with minerals   Oral Q breakfast   Continuous Infusions:  methocarbamol (ROBAXIN) IV     PRN Meds: acetaminophen, acetaminophen, alum & mag hydroxide-simeth, bisacodyl, magnesium hydroxide, menthol-cetylpyridinium **OR** phenol, methocarbamol **OR** methocarbamol (ROBAXIN) IV, morphine injection, ondansetron **OR** ondansetron (ZOFRAN) IV, oxyCODONE, polyethylene glycol, traMADol, traZODone   Vital Signs    Vitals:   02/06/22 2207 02/06/22 2334 02/07/22 0543 02/07/22 0841  BP: (!) 129/43 130/66 (!) 151/62 (!) 142/77  Pulse: 74 77 89 92  Resp:   20 18  Temp:  98 F (36.7 C) 97.6 F (36.4 C) 98.3 F (36.8 C)  TempSrc:  Oral    SpO2:  95% 93% 94%  Weight:      Height:        Intake/Output Summary (Last 24 hours) at 02/07/2022 1044 Last data filed at 02/07/2022 0500 Gross per 24 hour  Intake 157.78 ml  Output 500 ml  Net -342.22 ml      02/01/2022    6:19 PM 01/15/2022    8:52 AM 10/11/2021   11:13 AM  Last 3 Weights  Weight (lbs) 143 lb 4.8 oz 144 lb 9.6 oz 146 lb  Weight (kg) 65 kg 65.59 kg 66.225 kg      Telemetry    Normal sinus rhythm- Personally Reviewed  ECG     - Personally  Reviewed  Physical Exam  Constitutional:  oriented to person, place, and time. No distress.  HENT:  Head: Grossly normal Schuff:  no discharge. No scleral icterus.  Neck: No JVD, no carotid bruits  Cardiovascular: Regular rate and rhythm, no murmurs appreciated Pulmonary/Chest: Clear to auscultation bilaterally, no wheezes or rails Abdominal: Soft.  no distension.  no tenderness.  Musculoskeletal: Normal range of motion Neurological:  normal muscle tone. Coordination normal. No atrophy Skin: Skin warm and dry Psychiatric: normal affect, pleasant   Labs    High Sensitivity Troponin:   Recent Labs  Lab 02/04/22 0617 02/04/22 1243  TROPONINIHS 178* 174*     Chemistry Recent Labs  Lab 02/03/22 0307 02/04/22 0312 02/05/22 0200  NA 138 136 135  K 4.1 4.0 3.8  CL 103 99 99  CO2 30 29 28   GLUCOSE 182* 210* 219*  BUN 21 29* 33*  CREATININE 0.75 0.82 0.77  CALCIUM 9.0 9.3 9.2  GFRNONAA >60 >60 >60  ANIONGAP 5 8 8     Lipids No results for input(s): "CHOL", "TRIG", "HDL", "LABVLDL", "LDLCALC", "CHOLHDL" in the last 168 hours.  Hematology Recent Labs  Lab 02/05/22 0200 02/06/22 0416 02/06/22 1139 02/06/22 1919 02/07/22 0414  WBC 20.2* 15.3*  --   --  13.9*  RBC 3.73* 3.21*  --   --  3.09*  HGB 10.2* 8.8* 9.5* 8.9* 8.5*  HCT 31.2* 27.3* 29.5* 27.5* 26.3*  MCV 83.6 85.0  --   --  85.1  MCH 27.3 27.4  --   --  27.5  MCHC 32.7 32.2  --   --  32.3  RDW 13.6 13.4  --   --  13.9  PLT 232 225  --   --  226   Thyroid  Recent Labs  Lab 02/05/22 0200  TSH 1.308    BNP Recent Labs  Lab 02/04/22 0618  BNP 483.6*    DDimer  Recent Labs  Lab 02/04/22 0614  DDIMER 0.79*     Radiology    CT ABDOMEN PELVIS WO CONTRAST  Result Date: 02/06/2022 CLINICAL DATA:  Evaluate for retroperitoneal bleed. Status post IM nail. EXAM: CT ABDOMEN AND PELVIS WITHOUT CONTRAST TECHNIQUE: Multidetector CT imaging of the abdomen and pelvis was performed following the standard protocol  without IV contrast. RADIATION DOSE REDUCTION: This exam was performed according to the departmental dose-optimization program which includes automated exposure control, adjustment of the mA and/or kV according to patient size and/or use of iterative reconstruction technique. COMPARISON:  None Available. FINDINGS: Lower chest: Lung bases are clear. Hepatobiliary: No focal liver abnormality is seen. No gallstones, gallbladder wall thickening, or biliary dilatation. Pancreas: Along the body of pancreas there is a indeterminate structure containing a few calcifications which measures 2.2 by 1.6 cm, image 27/2. No signs of main duct dilatation or pancreatic inflammation. Spleen: Normal in size without focal abnormality. Adrenals/Urinary Tract: Left adrenal nodule measures 1.6 x 2.1 cm in 7.7 Hounsfield units, image 21/2. There is a right adrenal nodule which measures 1.7 by 1.1 cm and 9.23 Hounsfield units, image 23/2 these are compatible with lipid rich benign adenomas. No follow-up imaging recommended. Kidneys are unremarkable. Urinary bladder appears normal. Stomach/Bowel: Stomach appears within normal limits. There is moderate diffuse gaseous distension of the colon. There is also a dilated loop of small bowel within the lower abdomen which measures up to 2.9 cm, image 67/2. No signs of bowel wall thickening or inflammation. Vascular/Lymphatic: Aortic atherosclerosis. No signs of aneurysm. No abdominopelvic adenopathy. Reproductive: Uterus and bilateral adnexa are unremarkable. Other: No free fluid or fluid collections. No signs of retroperitoneal hematoma. Musculoskeletal: Postoperative changes from ORIF of recent intertrochanteric fracture of the proximal left femur. There is a hematoma within the subcutaneous soft tissues lateral to the left pelvis which measures 3.2 x 9.0 x 3.7 cm (volume = 56 cm^3), image 63/5. A second hematoma is partially visualized (measuring at least 3.3 x 2.7 cm on the axial images)  within the subcutaneous soft tissues of the left lower extremity, lateral to the proximal left femur, image 60/5. There is diffuse subcutaneous soft tissue stranding within both lower extremities, left greater than right. IMPRESSION: 1. No signs of retroperitoneal hematoma. 2. Hematomas within the subcutaneous soft tissues lateral to the left pelvis and lateral to the proximal left femur. The largest has a volume of approximately 56 cc. 3. Diffuse subcutaneous soft tissue stranding within both lower extremities, left greater than right. 4. Moderate diffuse gaseous distension of the colon with a dilated loop of small bowel within the lower abdomen. Findings are nonspecific and may be related to ileus. 5. Indeterminate, ovoid structure along the body of pancreas containing a few calcifications. This measures up to 2.2 cm. This does not require emergent attention. When the patient is clinically stable and  able to follow directions and hold their breath (preferably as an outpatient) further evaluation with dedicated abdominal MRI should be considered. 6. Aortic Atherosclerosis (ICD10-I70.0). Electronically Signed   By: Signa Kell M.D.   On: 02/06/2022 13:37    Cardiac Studies     Patient Profile     CAYLEA FORONDA is a 88 y.o. female with a hx of chronic HFpEF, left bundle branch block, hypertension complicated by whitecoat syndrome, and type 2 diabetes mellitus, who is being seen 02/04/2022 for the evaluation of abnormal EKG concerning for STEMI , noted to be in atrial fibrillation  Assessment & Plan    Atrial fibrillation with rapid ventricular response: Postoperative A-fib with RVR, converted back to normal sinus rhythm on metoprolol, lasting less than 24 hours Has been treated with heparin infusion Given slow decline in hemoglobin, concern for small hematomas in abdomen DVT prophylaxis recommended which could include SCIDS, possibly subcutaneous heparin versus Lovenox -We will recommend higher  dose metoprolol tartrate 50 twice daily for rate and rhythm control, hypertension --ZIO monitor on the day of discharge   Elevated troponin /demand ischemia Chronic left bundle branch block:  Coronary artery calcification noted on CT scan not having any concerning symptoms such as angina or dyspnea,  Denies anginal symptoms, -Outpatient follow-up for consideration of ischemic work-up   Chronic HFpEF: Trace bilateral pleural effusions on CT scan continue low-dose furosemide   Hypertension: on lisinopril, metoprolol 50 twice daily (dose has been increased )and furosemide.   Hip fracture: -Per orthopedics and internal medicine.  [facility] will sign off.   Medication Recommendations: No further medication changes Other recommendations (labs, testing, etc): No further testing Follow up as an outpatient: Routine follow-up in clinic for A-fib   Total encounter time more than 50 minutes  Greater than 50% was spent in counseling and coordination of care with the patient   For questions or updates, please contact [facility] Please consult [email or web address] for contact info under        Signed, Julien Nordmann, MD  02/07/2022, 10:44 AM

## 2022-02-07 NOTE — Progress Notes (Signed)
Physical Therapy Treatment Patient Details Name: Laurie Patel MRN: [id number] DOB: 1928/06/21 Today's Date: 02/07/2022   History of Present Illness Laurie Patel. Carreto is a 88 y.o. female who presented to the ER secondary to mechanical fall in home environment, acute onset of L hip, L shoulder pain; admitted for management of L comminuted intertrochanteric hip fracture s/p ORIF (02/02/22), L proximal humeral fx, non-operative, LUE to remain immobilized/NWB Per MD.    PT Comments    Pt was long sitting in bed upon arriving. She is A and agreeable to session, requesting to use BSC. Pt required extensive assistance to exit bed towards R. Pt unwilling to use LUE sling" It hurts my neck." Required Vcs throughout for reminders to not use LUE during task. Pt was able to stand and take few steps to transfer from EOB> BSC. Successfully urinated prior to standing and getting into recliner. Pt overall tolerated session well. She will require extensive PT going forward to maximize independence while returning to PLOF. Author will return this afternoon to advance gait distances and progress current POC.    Recommendations for follow up therapy are one component of a multi-disciplinary discharge planning process, led by the attending physician.  Recommendations may be updated based on patient status, additional functional criteria and insurance authorization.  Follow Up Recommendations  Skilled nursing-short term rehab (<3 hours/day)     Assistance Recommended at Discharge Frequent or constant Supervision/Assistance  Patient can return home with the following A lot of help with walking and/or transfers;A lot of help with bathing/dressing/bathroom;Assistance with cooking/housework;Direct supervision/assist for medications management;Direct supervision/assist for financial management;Assist for transportation;Help with stairs or ramp for entrance   Equipment Recommendations  Other (comment) (defer to next level  of care)       Precautions / Restrictions Precautions Precautions: Fall Required Braces or Orthoses: Sling (pt refusing to wear due to comfort " It hurts my neck.") Restrictions Weight Bearing Restrictions: Yes LUE Weight Bearing: Non weight bearing LLE Weight Bearing: Weight bearing as tolerated     Mobility  Bed Mobility Overal bed mobility: Needs Assistance Bed Mobility: Supine to Sit  Supine to sit: Mod assist, HOB elevated  General bed mobility comments: increased time + mod assist required to safely progress form long sitting to EOB short sit    Transfers Overall transfer level: Needs assistance Equipment used: Rolling walker (2 wheels) Transfers: Sit to/from Stand Sit to Stand: Mod assist, Max assist, From elevated surface Stand pivot transfers: Mod assist, Max assist  General transfer comment: pt was able to strand and pivot towards R with +1 HHA and mod-max assist of one. pt has poor standing posture but with vcs is able to correct.    Ambulation/Gait Ambulation/Gait assistance: Mod assist Gait Distance (Feet): 3 Feet Assistive device: 1 person hand held assist Gait Pattern/deviations: Step-to pattern, Trunk flexed Gait velocity: decreased     General Gait Details: pt was able to take a few steps however pt is unsteady." Dont let me go." Will progress gait distances in PM session.    Balance Overall balance assessment: Needs assistance Sitting-balance support: No upper extremity supported, Feet supported Sitting balance-Leahy Scale: Good     Standing balance support: Single extremity supported, During functional activity Standing balance-Leahy Scale: Poor Standing balance comment: pt is high fall risk     Cognition Arousal/Alertness: Awake/alert Behavior During Therapy: WFL for tasks assessed/performed Overall Cognitive Status: No family/caregiver present to determine baseline cognitive functioning      General Comments: Decreased safety  awarenss,  decreased recall of precautions and NWB status in LUE. Decreased awareness of deficits with functional use of LUE and LLE.            Pertinent Vitals/Pain Pain Assessment Pain Assessment: 0-10 Pain Score: 0-No pain     PT Goals (current goals can now be found in the care plan section) Acute Rehab PT Goals Patient Stated Goal: rehab then home Progress towards PT goals: Progressing toward goals    Frequency    BID      PT Plan Current plan remains appropriate       AM-PAC PT "6 Clicks" Mobility   Outcome Measure  Help needed turning from your back to your side while in a flat bed without using bedrails?: A Little Help needed moving from lying on your back to sitting on the side of a flat bed without using bedrails?: A Lot Help needed moving to and from a bed to a chair (including a wheelchair)?: A Lot Help needed standing up from a chair using your arms (e.g., wheelchair or bedside chair)?: A Lot Help needed to walk in hospital room?: A Lot Help needed climbing 3-5 steps with a railing? : Total 6 Click Score: 12    End of Session Equipment Utilized During Treatment: Gait belt Activity Tolerance: Patient tolerated treatment well Patient left: in chair;with call bell/phone within reach;with nursing/sitter in room Nurse Communication: Mobility status PT Visit Diagnosis: Muscle weakness (generalized) (M62.81);Other abnormalities of gait and mobility (R26.89);Pain Pain - Right/Left: Left Pain - part of body: Hip     Time: 1062-6948 PT Time Calculation (min) (ACUTE ONLY): 24 min  Charges:  $Therapeutic Activity: 23-37 mins                    Jetta Lout PTA 02/07/22, 9:48 AM

## 2022-02-07 NOTE — Consult Note (Signed)
ANTICOAGULATION CONSULT NOTE - Initial Consult  Pharmacy Consult for heparin infusion Indication: atrial fibrillation  Allergies  Allergen Reactions   Augmentin [Amoxicillin-Pot Clavulanate] Nausea And Vomiting   Prednisone     Patient Measurements: Height: 5' 5.5" (166.4 cm) Weight: 65 kg (143 lb 4.8 oz) IBW/kg (Calculated) : 58.15 Heparin Dosing Weight: 65 kg  Vital Signs: Temp: 98 F (36.7 C) (08/16 2334) Temp Source: Oral (08/16 2334) BP: 130/66 (08/16 2334) Pulse Rate: 77 (08/16 2334)  Labs: Recent Labs    02/04/22 0312 02/04/22 5852 02/04/22 0617 02/04/22 1243 02/04/22 1750 02/05/22 0200 02/06/22 0416 02/06/22 1139 02/06/22 1338 02/06/22 1919 02/06/22 2258  HGB 10.4*  --   --   --   --  10.2* 8.8* 9.5*  --  8.9*  --   HCT 31.8*  --   --   --   --  31.2* 27.3* 29.5*  --  27.5*  --   PLT 205  --   --   --   --  232 225  --   --   --   --   APTT  --  33  --   --   --   --   --   --   --   --   --   LABPROT  --  14.9  --   --   --   --   --   --   --   --   --   INR  --  1.2  --   --   --   --   --   --   --   --   --   HEPARINUNFRC  --   --   --   --    < > 0.31 0.28*  --  0.19*  --  0.51  CREATININE 0.82  --   --   --   --  0.77  --   --   --   --   --   TROPONINIHS  --   --  178* 174*  --   --   --   --   --   --   --    < > = values in this interval not displayed.     Estimated Creatinine Clearance: 41.2 mL/min (by C-G formula based on SCr of 0.77 mg/dL).   Medical History: Past Medical History:  Diagnosis Date   Ankle fracture 10/14/2017   Chronic heart failure with preserved ejection fraction (HFpEF) (HCC)    Heart murmur    Hypertension    LBBB (left bundle branch block)    LVH (left ventricular hypertrophy)    a. 06/2016 Echo: EF 60-65%, no rwma, mild MR. PASP ; b. 01/2021 Echo: EF 55-60%, sev assym septal hypertrophy (LV basal-mid septum up to 1.5cm). No SAM. No rwma. GrIDD. Nl RV fxn. Mild Ao sclerosis.   Palpitations    a. 07/2016  Holter: Avg HR 77 (54-117). 59 isolated PVCs. Rare PACs (<1%) w/ 3 atrial runs - longest 7 beats, fastest 170 bpm x 4 beats - PAT.   PNA (pneumonia) 02/16/2018    Medications:  Scheduled:   cholecalciferol  1,000 Units Oral Q breakfast   docusate sodium  100 mg Oral BID   feeding supplement  237 mL Oral BID BM   furosemide  20 mg Oral Q breakfast   insulin aspart  0-5 Units Subcutaneous QHS   insulin aspart  0-9 Units Subcutaneous TID  WC   lisinopril  10 mg Oral Daily   metoprolol tartrate  25 mg Oral BID   multivitamin with minerals   Oral Q breakfast   Infusions:   heparin 1,250 Units/hr (02/06/22 1537)   methocarbamol (ROBAXIN) IV     PRN: acetaminophen, acetaminophen, alum & mag hydroxide-simeth, bisacodyl, magnesium hydroxide, menthol-cetylpyridinium **OR** phenol, methocarbamol **OR** methocarbamol (ROBAXIN) IV, morphine injection, ondansetron **OR** ondansetron (ZOFRAN) IV, oxyCODONE, polyethylene glycol, traMADol, traZODone Heparin Dosing Weight: 65 kg  Assessment: [name] who presented to the emergency room with acute onset of accidental mechanical fall with subsequent left hip pain. Currently POD2 after intramedullary fixation for left intertrochanteric hip fracture. PMH significant for chronic HFpEF, LBBB, HTN complicated by whitecoat syndrome, and T2DM. 02/04/2022 ECG concerning for Afib with RVR and STEMI. 02/04/2022 D-Dimer 0.79, hsTrop 178.  Pharmacy has been consulted to initiate and manage heparin infusion.   Patient had a decrease in Hgb from 10.2 on 02/05/2022 to 8.8 on 02/06/2022 with AM labs. Currently trending H&H Q8H. Hgb improving. Continue to monitor closely and watch for signs and symptoms of bleeding.  Baseline Labs: Hgb 10.4, Hct 31.8, Plt 205, PT 14.9, INR 1.2, aPTT 33    Date Time  HL Rate/Comment 8/14 1750 0.37 Therapeutic x1; 950 un/hr  8/15     0200 0.31     Therapeutic x2   8/16     0416 0.28     SUBtherapeutic  8/16  1338 0.19      SUBtherapeutic  8/16     2258    0.51     Therapeutic X 1    Goal of Therapy:  Heparin level 0.3-0.7 units/ml Monitor platelets by anticoagulation protocol: Yes   Plan:  8/16:  HL @ 2258 = 0.51, therapeutic X 1 Will continue pt on current rate and draw confirmation level on 8/17 @ 0700.  Awais Cobarrubias D 02/07/2022 12:05 AM

## 2022-02-07 NOTE — Plan of Care (Signed)

## 2022-02-07 NOTE — Progress Notes (Signed)
PROGRESS NOTE  Laurie Patel PZW:[id number] DOB: 1928/04/09 DOA: 02/01/2022 PCP: Glori Luis, MD  Brief History   Mrs. Stubbe is a 88 y.o. F with HTN, DM, dCHF, and hx LBBB who presented with a mechanical fall and left hip pain. The patient underwent ORIF of the left hip on 02/03/2022. She has tolerated the procedure well.   In the early morning hours of 02/04/2022 the patient went into atrial fibrillation with RVR. Cardiology was consulted. The patient's heart rate has converted to sinus rhythm on lopressor. She was started on a heparin drip for stroke prophylaxis.  The patient's hemoglobin has dropped quite a bit since admission and particularly after heparin drip was started. Hemoglobin has trended downward since admission with Hgb of 12.5. Hgb briefly increased to 14 on the day after admission. Postoperatively the patient's hemoglobin has dropped to 10.4. After heparin drip was started it has dropped to 8.8 although most recent H&H was 9.5. The patient denies any black, maroon, or bloody BM's. FOBT is pending. CT of the abdomen and pelvis ruled out retroperitoneal bleed, but did confirm multiple hematomas in the area of the patient's recent ORIF. Will continue to monitor hemoglobin. Outcome of this observation is central to the decision whether or not to discharge the patient on coumadin for stroke prophylaxis for her atrial fibrillation.   Consultants  Orthopedic surgery Cardiology  Procedures  ORIF Left hip.  Antibiotics   Anti-infectives (From admission, onward)    Start     Dose/Rate Route Frequency Ordered Stop   02/02/22 1700  ceFAZolin (ANCEF) IVPB 2g/100 mL premix        2 g 200 mL/hr over 30 Minutes Intravenous Every 6 hours 02/02/22 1424 02/02/22 2249   02/02/22 1016  ceFAZolin (ANCEF) 2-4 GM/100ML-% IVPB       Note to Pharmacy: Jenetta Downer: cabinet override      02/02/22 1016 02/03/22 1000   02/02/22 0000  ceFAZolin (ANCEF) IVPB 2g/100 mL premix        2  g 200 mL/hr over 30 Minutes Intravenous 30 min pre-op 02/01/22 2347 02/02/22 1122      Subjective  The patient is resting comfortably. No new complaints.   Objective   Vitals:  Vitals:   02/07/22 0543 02/07/22 0841  BP: (!) 151/62 (!) 142/77  Pulse: 89 92  Resp: 20 18  Temp: 97.6 F (36.4 C) 98.3 F (36.8 C)  SpO2: 93% 94%    Exam:  Constitutional:  The patient is awake, alert, and oriented x 3. No acute distress. Respiratory:  No increased work of breathing. No wheezes, rales, or rhonchi No tactile fremitus Cardiovascular:  Regular rate and rhythm No murmurs, ectopy, or gallups. No lateral PMI. No thrills. Abdomen:  Abdomen is soft, non-tender, non-distended No hernias, masses, or organomegaly Normoactive bowel sounds.  Musculoskeletal:  No cyanosis, clubbing, or edema Skin:  No rashes, lesions, ulcers palpation of skin: no induration or nodules Neurologic:  CN 2-12 intact Sensation all 4 extremities intact Psychiatric:  Mental status Mood, affect appropriate Orientation to person, place, time  judgment and insight appear intact  I have personally reviewed the following:   Today's Data  Vitals  Lab Data  CBC BMP  Micro Data    Imaging  CT abdomen and pelvis  Cardiology Data  EKG Echocardiogram  Other Data    Scheduled Meds:  cholecalciferol  1,000 Units Oral Q breakfast   docusate sodium  100 mg Oral BID   feeding supplement  237 mL Oral BID BM   furosemide  20 mg Oral Q breakfast   insulin aspart  0-5 Units Subcutaneous QHS   insulin aspart  0-9 Units Subcutaneous TID WC   lisinopril  10 mg Oral Daily   metoprolol tartrate  50 mg Oral BID   multivitamin with minerals   Oral Q breakfast   Continuous Infusions:  methocarbamol (ROBAXIN) IV      Principal Problem:   Closed intertrochanteric fracture of hip, left, initial encounter (HCC) Active Problems:   Essential hypertension   Chronic diastolic CHF (congestive heart  failure) (HCC)   Acute posthemorrhagic anemia   Controlled type 2 diabetes mellitus without complication, without long-term current use of insulin (HCC)   Hyperlipidemia   Closed fracture of left proximal humerus   Atelectasis   Paroxysmal atrial fibrillation (HCC)   LOS: 6 days   A & P  Assessment and Plan: * Closed intertrochanteric fracture of hip, left, initial encounter (HCC) S/p Intramedullary fixation for left intertrochanteric hip fracturby Dr. Martha Clan.   - Vitamin D 52  - PT eval completed. Recommendation is for SNF.    Essential hypertension The patient's blood pressures are a little high on lisinopril, metoprolol and lasix. Dose of metoprolol has been increased.   Chronic diastolic CHF (congestive heart failure) (HCC) Appears euvolemic - Continue Lasix  Acute posthemorrhagic anemia Hemoglobin has trended downward since admission with Hgb of 12.5. Hgb briefly increased to 14 on the day after admission. Postoperatively the patient's hemoglobin has dropped to 10.4. After heparin drip was started it has dropped to 8.8 although most recent H&H was 9.5. The patient denies any black, maroon, or bloody BM's. FOBT is negative. CT of the abdomen and pelvis ruled out retroperitoneal bleed, but did confirm multiple hematomas in the area of the patient's recent ORIF. Will continue to monitor hemoglobin. As the patient's hemoglobin has continued to drift down, heparin has been discontinued by cardiology. They plan to discharge the patient to home with a cardiac monitor to determine whether or not stroke prophylaxis is required. The patient is currently in sinus rhythm.  Paroxysmal atrial fibrillation (HCC) New onset 8/14 postoperatively.  Cardiology were consulted, they recommended anticoagulation and started metoprolol. --The patient is currently in sinus rhythm on metoprolol. --Cardiology has stopped heparin. The patient will be discharged with a monitor to determine if she is going  back into atrial fibrillation and is at risk for stroke. She will not be discharged with anticoagulation out of concern for the patient's ongoing anemia.   Atelectasis Resolving. The patient is currently saturating 94% on room air. Continue incentive spirometry.  Closed fracture of left proximal humerus - Nonweightbearing to left upper extremity - Maintain left arm in sling -Outpatient orthopedics follow-up  Hyperlipidemia Not on a statin, presumably due to age  Controlled type 2 diabetes mellitus without complication, without long-term current use of insulin (HCC) This is diet controlled.  She had mild hyperglycemia here.  Last hemoglobin A1c was 7.9%.   - Continue SS corrections   I have seen and examined this patient myself. I have spent 38 minutes in her evaluation and care.  DVT prophylaxis: Heparin drip Code Status: Full Code Family Communication: None available Disposition Plan: CNF    Chael Urenda, DO [facility] Direct contact: see [email or web address]  7PM-7AM contact night coverage as above 02/07/2022, 4:35 PM  LOS: 5 days

## 2022-02-07 NOTE — Progress Notes (Signed)
Occupational Therapy Treatment Patient Details Name: Laurie Patel MRN: [id number] DOB: January 06, 1928 Today's Date: 02/07/2022   History of present illness Laurie Patel is a 88 y.o. female who presented to the ER secondary to mechanical fall in home environment, acute onset of L hip, L shoulder pain; admitted for management of L comminuted intertrochanteric hip fracture s/p ORIF (02/02/22), L proximal humeral fx, non-operative, LUE to remain immobilized/NWB Per MD.   OT comments  Chart reviewed, pt finishing up with PT, OT tx session started after chair follow provided for amb. Pt is agreeable to tx session, is alert, oriented x4 however presents with impaired safety awareness and awareness of deficits. Tx session targeted improving activity tolerance, independence during ADL tasks. Pt requires MOD A for UB dressing (sling and gown), MOD-MAX A for STS 1 attempt with pt able to maintain static standing balance for approx 1 minute with MIN A. SET UP required for grooming tasks. Progress is being made towards goals however pt continues to perform below PLOF. Pt is left as received, NAD, all needs met. OT Will follow acutely.    Recommendations for follow up therapy are one component of a multi-disciplinary discharge planning process, led by the attending physician.  Recommendations may be updated based on patient status, additional functional criteria and insurance authorization.    Follow Up Recommendations  Skilled nursing-short term rehab (<3 hours/day)    Assistance Recommended at Discharge Frequent or constant Supervision/Assistance  Patient can return home with the following  A lot of help with walking and/or transfers;A lot of help with bathing/dressing/bathroom;Assist for transportation;Help with stairs or ramp for entrance   Equipment Recommendations  None recommended by OT    Recommendations for Other Services      Precautions / Restrictions Precautions Precautions: Fall Required  Braces or Orthoses: Sling Restrictions Weight Bearing Restrictions: Yes LUE Weight Bearing: Non weight bearing LLE Weight Bearing: Weight bearing as tolerated       Mobility Bed Mobility               General bed mobility comments: NT pt in chair pre/post session    Transfers Overall transfer level: Needs assistance Equipment used: 1 person hand held assist Transfers: Sit to/from Stand Sit to Stand: Mod assist, Max assist (from chair surface)           General transfer comment: Pt stood at edge of chair for approx 1 minute with MIN A     Balance Overall balance assessment: Needs assistance Sitting-balance support: No upper extremity supported, Feet supported Sitting balance-Leahy Scale: Good     Standing balance support: Single extremity supported, During functional activity Standing balance-Leahy Scale: Poor                             ADL either performed or assessed with clinical judgement   ADL Overall ADL's : Needs assistance/impaired     Grooming: Wash/dry face;Sitting;Set up           Upper Body Dressing : Moderate assistance Upper Body Dressing Details (indicate cue type and reason): donn/doff sling and gown with step by step vcs for technique     Toilet Transfer: Moderate assistance;Maximal assistance Toilet Transfer Details (indicate cue type and reason): simulated with step by step vcs for adherence to NWBing                Extremity/Trunk Assessment  Vision       Perception     Praxis      Cognition Arousal/Alertness: Awake/alert Behavior During Therapy: WFL for tasks assessed/performed Overall Cognitive Status: Impaired/Different from baseline Area of Impairment: Safety/judgement, Awareness, Problem solving, Memory, Following commands, Attention                   Current Attention Level: Selective Memory: Decreased short-term memory Following Commands: Follows one step commands  consistently Safety/Judgement: Decreased awareness of safety, Decreased awareness of deficits Awareness: Emergent Problem Solving: Requires verbal cues, Requires tactile cues          Exercises      Shoulder Instructions       General Comments Improved adherence to NWBing, pt tolerated wearing sling after initially adjusted by PT without complaint    Pertinent Vitals/ Pain       Pain Assessment Pain Assessment: 0-10 Pain Score: 0-No pain  Home Living                                          Prior Functioning/Environment              Frequency  Min 2X/week        Progress Toward Goals  OT Goals(current goals can now be found in the care plan section)  Progress towards OT goals: Progressing toward goals     Plan Discharge plan remains appropriate;Frequency remains appropriate    Co-evaluation                 AM-PAC OT "6 Clicks" Daily Activity     Outcome Measure   Help from another person eating meals?: A Little Help from another person taking care of personal grooming?: A Little Help from another person toileting, which includes using toliet, bedpan, or urinal?: A Lot Help from another person bathing (including washing, rinsing, drying)?: A Lot Help from another person to put on and taking off regular upper body clothing?: A Little Help from another person to put on and taking off regular lower body clothing?: A Lot 6 Click Score: 15    End of Session Equipment Utilized During Treatment: Gait belt  OT Visit Diagnosis: Unsteadiness on feet (R26.81);Muscle weakness (generalized) (M62.81)   Activity Tolerance Patient tolerated treatment well   Patient Left in chair;with call bell/phone within reach;with chair alarm set   Nurse Communication Mobility status        Time: 7972-8206 OT Time Calculation (min): 14 min  Charges: OT Treatments $Self Care/Home Management : 8-22 mins  Shanon Payor, OTD OTR/L  02/07/22, 3:53 PM

## 2022-02-07 NOTE — Plan of Care (Signed)

## 2022-02-07 NOTE — Progress Notes (Signed)
Subjective:  POD #5 s/p intramedullary fixation for left intertrochanteric hip fracture.  Patient has concomitant left proximal humerus fracture.  The patient reports left hip and shoulder pain as mild.  Patient seen with her RN in the room.  Hemoglobin 8.5 today.  Heparin has been stopped.  Patient not currently stating anticoagulation therapy.  CT scan of the pelvis yesterday demonstrated small hematoma at the left hip.  Objective:   VITALS:   Vitals:   02/06/22 2334 02/07/22 0543 02/07/22 0841 02/07/22 1735  BP: 130/66 (!) 151/62 (!) 142/77 (!) 128/50  Pulse: 77 89 92 74  Resp:  20 18 16   Temp: 98 F (36.7 C) 97.6 F (36.4 C) 98.3 F (36.8 C) 97.8 F (36.6 C)  TempSrc: Oral     SpO2: 95% 93% 94% 94%  Weight:      Height:        PHYSICAL EXAM: Lower extremity: Patient's incisions are intact.  There is significant ecchymosis around her left hip.  Thigh compartments are soft and compressible.  She remains neurovascular intact distally.  With the help of the RN, patient's dressings were changed.  The superiormost nursing was saturated with serosanguineous drainage.   LABS  Results for orders placed or performed during the hospital encounter of 02/01/22 (from the past 24 hour(s))  Hemoglobin and hematocrit, blood     Status: Abnormal   Collection Time: 02/06/22  7:19 PM  Result Value Ref Range   Hemoglobin 8.9 (L) 12.0 - 15.0 g/dL   HCT 27.5 (L) 36.0 - 46.0 %  Glucose, capillary     Status: Abnormal   Collection Time: 02/06/22  8:59 PM  Result Value Ref Range   Glucose-Capillary 243 (H) 70 - 99 mg/dL   Comment 1 Notify RN   Heparin level (unfractionated)     Status: None   Collection Time: 02/06/22 10:58 PM  Result Value Ref Range   Heparin Unfractionated 0.51 0.30 - 0.70 IU/mL  CBC     Status: Abnormal   Collection Time: 02/07/22  4:14 AM  Result Value Ref Range   WBC 13.9 (H) 4.0 - 10.5 K/uL   RBC 3.09 (L) 3.87 - 5.11 MIL/uL   Hemoglobin 8.5 (L) 12.0 - 15.0 g/dL    HCT 26.3 (L) 36.0 - 46.0 %   MCV 85.1 80.0 - 100.0 fL   MCH 27.5 26.0 - 34.0 pg   MCHC 32.3 30.0 - 36.0 g/dL   RDW 13.9 11.5 - 15.5 %   Platelets 226 150 - 400 K/uL   nRBC 0.0 0.0 - 0.2 %  Heparin level (unfractionated)     Status: None   Collection Time: 02/07/22  7:03 AM  Result Value Ref Range   Heparin Unfractionated 0.52 0.30 - 0.70 IU/mL  Glucose, capillary     Status: Abnormal   Collection Time: 02/07/22  7:38 AM  Result Value Ref Range   Glucose-Capillary 162 (H) 70 - 99 mg/dL  Glucose, capillary     Status: Abnormal   Collection Time: 02/07/22  8:43 AM  Result Value Ref Range   Glucose-Capillary 195 (H) 70 - 99 mg/dL  Hemoglobin and hematocrit, blood     Status: Abnormal   Collection Time: 02/07/22 11:22 AM  Result Value Ref Range   Hemoglobin 9.1 (L) 12.0 - 15.0 g/dL   HCT 28.5 (L) 36.0 - 46.0 %  Glucose, capillary     Status: Abnormal   Collection Time: 02/07/22 12:06 PM  Result Value Ref Range  Glucose-Capillary 172 (H) 70 - 99 mg/dL  Occult blood card to lab, stool     Status: None   Collection Time: 02/07/22  3:19 PM  Result Value Ref Range   Fecal Occult Bld NEGATIVE NEGATIVE  Glucose, capillary     Status: Abnormal   Collection Time: 02/07/22  5:37 PM  Result Value Ref Range   Glucose-Capillary 220 (H) 70 - 99 mg/dL    CT ABDOMEN PELVIS WO CONTRAST  Result Date: 02/06/2022 CLINICAL DATA:  Evaluate for retroperitoneal bleed. Status post IM nail. EXAM: CT ABDOMEN AND PELVIS WITHOUT CONTRAST TECHNIQUE: Multidetector CT imaging of the abdomen and pelvis was performed following the standard protocol without IV contrast. RADIATION DOSE REDUCTION: This exam was performed according to the departmental dose-optimization program which includes automated exposure control, adjustment of the mA and/or kV according to patient size and/or use of iterative reconstruction technique. COMPARISON:  None Available. FINDINGS: Lower chest: Lung bases are clear. Hepatobiliary: No  focal liver abnormality is seen. No gallstones, gallbladder wall thickening, or biliary dilatation. Pancreas: Along the body of pancreas there is a indeterminate structure containing a few calcifications which measures 2.2 by 1.6 cm, image 27/2. No signs of main duct dilatation or pancreatic inflammation. Spleen: Normal in size without focal abnormality. Adrenals/Urinary Tract: Left adrenal nodule measures 1.6 x 2.1 cm in 7.7 Hounsfield units, image 21/2. There is a right adrenal nodule which measures 1.7 by 1.1 cm and 9.23 Hounsfield units, image 23/2 these are compatible with lipid rich benign adenomas. No follow-up imaging recommended. Kidneys are unremarkable. Urinary bladder appears normal. Stomach/Bowel: Stomach appears within normal limits. There is moderate diffuse gaseous distension of the colon. There is also a dilated loop of small bowel within the lower abdomen which measures up to 2.9 cm, image 67/2. No signs of bowel wall thickening or inflammation. Vascular/Lymphatic: Aortic atherosclerosis. No signs of aneurysm. No abdominopelvic adenopathy. Reproductive: Uterus and bilateral adnexa are unremarkable. Other: No free fluid or fluid collections. No signs of retroperitoneal hematoma. Musculoskeletal: Postoperative changes from ORIF of recent intertrochanteric fracture of the proximal left femur. There is a hematoma within the subcutaneous soft tissues lateral to the left pelvis which measures 3.2 x 9.0 x 3.7 cm (volume = 56 cm^3), image 63/5. A second hematoma is partially visualized (measuring at least 3.3 x 2.7 cm on the axial images) within the subcutaneous soft tissues of the left lower extremity, lateral to the proximal left femur, image 60/5. There is diffuse subcutaneous soft tissue stranding within both lower extremities, left greater than right. IMPRESSION: 1. No signs of retroperitoneal hematoma. 2. Hematomas within the subcutaneous soft tissues lateral to the left pelvis and lateral to the  proximal left femur. The largest has a volume of approximately 56 cc. 3. Diffuse subcutaneous soft tissue stranding within both lower extremities, left greater than right. 4. Moderate diffuse gaseous distension of the colon with a dilated loop of small bowel within the lower abdomen. Findings are nonspecific and may be related to ileus. 5. Indeterminate, ovoid structure along the body of pancreas containing a few calcifications. This measures up to 2.2 cm. This does not require emergent attention. When the patient is clinically stable and able to follow directions and hold their breath (preferably as an outpatient) further evaluation with dedicated abdominal MRI should be considered. 6. Aortic Atherosclerosis (ICD10-I70.0). Electronically Signed   By: Signa Kell M.D.   On: 02/06/2022 13:37    Assessment/Plan: 5 Days Post-Op   Principal Problem:   Closed  intertrochanteric fracture of hip, left, initial encounter [facility]) Active Problems:   Controlled type 2 diabetes mellitus without complication, without long-term current use of insulin (HCC)   Hyperlipidemia   Chronic diastolic CHF (congestive heart failure) (HCC)   Essential hypertension   Closed fracture of left proximal humerus   Atelectasis   Paroxysmal atrial fibrillation (HCC)   Acute posthemorrhagic anemia  Patient not having any significant left hip pain.  She is making progress with physical therapy.  Patient is weightbearing as tolerated in the left lower extremity but is nonweightbearing on the left upper extremity.  Continue sling for comfort.  No active forward elevation or abduction of the left shoulder.    Juanell Fairly , MD 02/07/2022, 6:07 PM

## 2022-02-07 NOTE — Care Management Important Message (Signed)
Important Message  Patient Details  Name: Laurie Patel MRN: [id number] Date of Birth: [birth year]   Medicare Important Message Given:  Yes     Olegario Messier A Layni Kreamer 02/07/2022, 3:16 PM

## 2022-02-07 NOTE — Progress Notes (Signed)
Physical Therapy Treatment Patient Details Name: SIMRIN VEGH MRN: [id number] DOB: Sep 23, 1927 Today's Date: 02/07/2022   History of Present Illness Sala Tague. Sharber is a 88 y.o. female who presented to the ER secondary to mechanical fall in home environment, acute onset of L hip, L shoulder pain; admitted for management of L comminuted intertrochanteric hip fracture s/p ORIF (02/02/22), L proximal humeral fx, non-operative, LUE to remain immobilized/NWB Per MD.    PT Comments    Pt was long sitting in bed with supportive daughter at bedside. She reports not eating any lunch. Author gave pt peanut butter crackers. She was agreeable to PT session and cooperative throughout. Continues to require mod assist to safely exit R side of bed. Vcs throughout for maintaining NWB LUE. Pt did stand several times EOB prior to ambulating a very short distance(6 ft) with HHA +1. Pt is extremely unsteady and a high fall risk. Fatigues quickly however author suspects some self limiting due to fear of falling. Pt is far from baseline. Highly recommend DC to SNF top address deficits while maximizing safety with ADLs.     Recommendations for follow up therapy are one component of a multi-disciplinary discharge planning process, led by the attending physician.  Recommendations may be updated based on patient status, additional functional criteria and insurance authorization.  Follow Up Recommendations  Skilled nursing-short term rehab (<3 hours/day)     Assistance Recommended at Discharge Frequent or constant Supervision/Assistance  Patient can return home with the following A lot of help with walking and/or transfers;A lot of help with bathing/dressing/bathroom;Assistance with cooking/housework;Direct supervision/assist for medications management;Direct supervision/assist for financial management;Assist for transportation;Help with stairs or ramp for entrance   Equipment Recommendations  Other (comment)        Precautions / Restrictions Precautions Precautions: Fall Required Braces or Orthoses: Sling Restrictions Weight Bearing Restrictions: Yes LUE Weight Bearing: Non weight bearing LLE Weight Bearing: Weight bearing as tolerated     Mobility  Bed Mobility Overal bed mobility: Needs Assistance Bed Mobility: Supine to Sit  Supine to sit: Mod assist, HOB elevated Sit to supine: Mod assist   General bed mobility comments: Increased time required with VCs and mod assist. Vcs fo sequencing and to avoid use of LUE    Transfers Overall transfer level: Needs assistance Equipment used: Rolling walker (2 wheels) Transfers: Sit to/from Stand Sit to Stand: Mod assist, From elevated surface Stand pivot transfers: Mod assist, Max assist         General transfer comment: pt performed STS at least 4 x throughout session. endorses slight dizziness however BP stable. upon sitting up EOB BP 148/73. after standing for 2 minutes 155/54. Pt is unsteady even with RUEE support.    Ambulation/Gait Ambulation/Gait assistance: Mod assist Gait Distance (Feet): 6 Feet Assistive device: 1 person hand held assist Gait Pattern/deviations: Step-to pattern, Trunk flexed Gait velocity: decreased     General Gait Details: pt ambulated ~ 6 ft with slow step to , unsteady gait. she fatigues quickly and required vcs throughout for imporved posture and gait sequencing. pt is high fall risk with all standing activity.    Balance Overall balance assessment: Needs assistance Sitting-balance support: No upper extremity supported, Feet supported Sitting balance-Leahy Scale: Good     Standing balance support: Single extremity supported, During functional activity Standing balance-Leahy Scale: Poor Standing balance comment: pt is high fall risk       Cognition Arousal/Alertness: Awake/alert Behavior During Therapy: WFL for tasks assessed/performed Overall Cognitive Status: No  family/caregiver present to  determine baseline cognitive functioning      General Comments: Decreased safety awarenss, decreased recall of precautions and NWB status in LUE. Decreased awareness of deficits with functional use of LUE and LLE.           General Comments General comments (skin integrity, edema, etc.): Improved adherence to NWBing, pt tolerated wearing sling after initially adjusted by PT without complaint      Pertinent Vitals/Pain Pain Assessment Pain Assessment: No/denies pain Pain Score: 0-No pain Pain Intervention(s): Limited activity within patient's tolerance, Monitored during session, Premedicated before session     PT Goals (current goals can now be found in the care plan section) Acute Rehab PT Goals Patient Stated Goal: rehab then home Progress towards PT goals: Progressing toward goals    Frequency    BID      PT Plan Current plan remains appropriate       AM-PAC PT "6 Clicks" Mobility   Outcome Measure  Help needed turning from your back to your side while in a flat bed without using bedrails?: A Little Help needed moving from lying on your back to sitting on the side of a flat bed without using bedrails?: A Lot Help needed moving to and from a bed to a chair (including a wheelchair)?: A Lot Help needed standing up from a chair using your arms (e.g., wheelchair or bedside chair)?: A Lot Help needed to walk in hospital room?: A Lot Help needed climbing 3-5 steps with a railing? : Total 6 Click Score: 12    End of Session   Activity Tolerance: Patient tolerated treatment well Patient left: in chair;with call bell/phone within reach;with nursing/sitter in room Nurse Communication: Mobility status PT Visit Diagnosis: Muscle weakness (generalized) (M62.81);Other abnormalities of gait and mobility (R26.89);Pain Pain - Right/Left: Left Pain - part of body: Hip     Time: 6861-6837 PT Time Calculation (min) (ACUTE ONLY): 23 min  Charges:  $Gait Training: 8-22  mins $Therapeutic Activity: 8-22 mins                    Jetta Lout PTA 02/07/22, 3:56 PM

## 2022-02-07 NOTE — Progress Notes (Signed)
Nutrition Follow-up  DOCUMENTATION CODES:   Not applicable  INTERVENTION:   -Continue Ensure Enlive po BID, each supplement provides 350 kcal and 20 grams of protein -Continue Magic cup TID with meals, each supplement provides 290 kcal and 9 grams of protein  -MVI with minerals daily  NUTRITION DIAGNOSIS:   Increased nutrient needs related to post-op healing, hip fracture as evidenced by estimated needs.  Ongoing  GOAL:   Patient will meet greater than or equal to 90% of their needs  Progressing   MONITOR:   PO intake, Supplement acceptance, Labs, Skin, Weight trends  REASON FOR ASSESSMENT:   Consult Assessment of nutrition requirement/status, Hip fracture protocol  ASSESSMENT:   Pt admitted from home after a fall leading to L hip fracture. PMH significant for HTN, DM, dCHF, and LBBB.  8/12- s/p Intramedullary fixation for left intertrochanteric hip fracture  Reviewed I/O's: -342 ml x 24 hours and -1 L since admission  UOP: 500 ml x 24 hours   Pt receiving nursing care at time of visit.   Pt currently on a carb modified diet. No meal completion data documented. Pt is consuming Ensure supplements.   Medications reviewed and include vitamin D3 and colace.    Per TOC notes, pt awaiting SNF placement for discharge.   Labs reviewed: CBGS: 162-195 (inpatient orders for glycemic control are 0-9 units insulin aspart TID with meals).    Diet Order:   Diet Order             Diet Carb Modified Fluid consistency: Thin; Room service appropriate? Yes  Diet effective now                   EDUCATION NEEDS:   Education needs have been addressed  Skin:  Skin Assessment: Skin Integrity Issues: Skin Integrity Issues:: Incisions Incisions: L hip (closed)  Last BM:  02/06/22 (type 7)  Height:   Ht Readings from Last 1 Encounters:  02/01/22 5' 5.5" (1.664 m)    Weight:   Wt Readings from Last 1 Encounters:  02/01/22 65 kg   BMI:  Body mass index is 23.48  kg/m.  Estimated Nutritional Needs:   Kcal:  1600-1800  Protein:  85-100 grams  Fluid:  > 1.6 L    Levada Schilling, RD, LDN, CDCES Registered Dietitian II Certified Diabetes Care and Education Specialist Please refer to [facility] for RD and/or RD on-call/weekend/after hours pager

## 2022-02-08 ENCOUNTER — Inpatient Hospital Stay
Admit: 2022-02-08 | Discharge: 2022-02-08 | Disposition: A | Discharge status: Home / Self Care | Payer: Medicare Other | Attending: Nurse Practitioner | Admitting: Nurse Practitioner

## 2022-02-08 ENCOUNTER — Telehealth: Payer: Self-pay

## 2022-02-08 ENCOUNTER — Other Ambulatory Visit: Payer: Self-pay | Admitting: Nurse Practitioner

## 2022-02-08 DIAGNOSIS — I480 Paroxysmal atrial fibrillation: Secondary | ICD-10-CM

## 2022-02-08 DIAGNOSIS — R52 Pain, unspecified: Secondary | ICD-10-CM | POA: Diagnosis not present

## 2022-02-08 DIAGNOSIS — Z743 Need for continuous supervision: Secondary | ICD-10-CM | POA: Diagnosis not present

## 2022-02-08 DIAGNOSIS — I5042 Chronic combined systolic (congestive) and diastolic (congestive) heart failure: Secondary | ICD-10-CM | POA: Diagnosis not present

## 2022-02-08 DIAGNOSIS — D62 Acute posthemorrhagic anemia: Secondary | ICD-10-CM | POA: Diagnosis not present

## 2022-02-08 DIAGNOSIS — R6889 Other general symptoms and signs: Secondary | ICD-10-CM | POA: Diagnosis not present

## 2022-02-08 DIAGNOSIS — J9811 Atelectasis: Secondary | ICD-10-CM | POA: Diagnosis not present

## 2022-02-08 DIAGNOSIS — W19XXXD Unspecified fall, subsequent encounter: Secondary | ICD-10-CM | POA: Diagnosis not present

## 2022-02-08 DIAGNOSIS — S72142D Displaced intertrochanteric fracture of left femur, subsequent encounter for closed fracture with routine healing: Secondary | ICD-10-CM | POA: Diagnosis not present

## 2022-02-08 DIAGNOSIS — K5904 Chronic idiopathic constipation: Secondary | ICD-10-CM | POA: Diagnosis not present

## 2022-02-08 DIAGNOSIS — W19XXXA Unspecified fall, initial encounter: Secondary | ICD-10-CM | POA: Diagnosis not present

## 2022-02-08 DIAGNOSIS — S72002D Fracture of unspecified part of neck of left femur, subsequent encounter for closed fracture with routine healing: Secondary | ICD-10-CM | POA: Diagnosis not present

## 2022-02-08 DIAGNOSIS — S42202A Unspecified fracture of upper end of left humerus, initial encounter for closed fracture: Secondary | ICD-10-CM | POA: Diagnosis not present

## 2022-02-08 DIAGNOSIS — S72142A Displaced intertrochanteric fracture of left femur, initial encounter for closed fracture: Secondary | ICD-10-CM | POA: Diagnosis not present

## 2022-02-08 DIAGNOSIS — E7800 Pure hypercholesterolemia, unspecified: Secondary | ICD-10-CM | POA: Diagnosis not present

## 2022-02-08 DIAGNOSIS — S42202D Unspecified fracture of upper end of left humerus, subsequent encounter for fracture with routine healing: Secondary | ICD-10-CM | POA: Diagnosis not present

## 2022-02-08 DIAGNOSIS — E118 Type 2 diabetes mellitus with unspecified complications: Secondary | ICD-10-CM | POA: Diagnosis not present

## 2022-02-08 DIAGNOSIS — I10 Essential (primary) hypertension: Secondary | ICD-10-CM | POA: Diagnosis not present

## 2022-02-08 DIAGNOSIS — Z79899 Other long term (current) drug therapy: Secondary | ICD-10-CM | POA: Diagnosis not present

## 2022-02-08 DIAGNOSIS — I5032 Chronic diastolic (congestive) heart failure: Secondary | ICD-10-CM | POA: Diagnosis not present

## 2022-02-08 DIAGNOSIS — M810 Age-related osteoporosis without current pathological fracture: Secondary | ICD-10-CM | POA: Diagnosis not present

## 2022-02-08 DIAGNOSIS — E559 Vitamin D deficiency, unspecified: Secondary | ICD-10-CM | POA: Diagnosis not present

## 2022-02-08 DIAGNOSIS — I110 Hypertensive heart disease with heart failure: Secondary | ICD-10-CM | POA: Diagnosis not present

## 2022-02-08 DIAGNOSIS — Z7401 Bed confinement status: Secondary | ICD-10-CM | POA: Diagnosis not present

## 2022-02-08 DIAGNOSIS — D509 Iron deficiency anemia, unspecified: Secondary | ICD-10-CM | POA: Diagnosis not present

## 2022-02-08 DIAGNOSIS — E119 Type 2 diabetes mellitus without complications: Secondary | ICD-10-CM | POA: Diagnosis not present

## 2022-02-08 LAB — CBC WITH DIFFERENTIAL/PLATELET
Abs Immature Granulocytes: 0.22 10*3/uL — ABNORMAL HIGH (ref 0.00–0.07)
Basophils Absolute: 0.1 10*3/uL (ref 0.0–0.1)
Basophils Relative: 0 %
Eosinophils Absolute: 0.2 10*3/uL (ref 0.0–0.5)
Eosinophils Relative: 1 %
HCT: 28.9 % — ABNORMAL LOW (ref 36.0–46.0)
Hemoglobin: 9.4 g/dL — ABNORMAL LOW (ref 12.0–15.0)
Immature Granulocytes: 2 %
Lymphocytes Relative: 13 %
Lymphs Abs: 1.9 10*3/uL (ref 0.7–4.0)
MCH: 27.5 pg (ref 26.0–34.0)
MCHC: 32.5 g/dL (ref 30.0–36.0)
MCV: 84.5 fL (ref 80.0–100.0)
Monocytes Absolute: 1.3 10*3/uL — ABNORMAL HIGH (ref 0.1–1.0)
Monocytes Relative: 9 %
Neutro Abs: 10.7 10*3/uL — ABNORMAL HIGH (ref 1.7–7.7)
Neutrophils Relative %: 75 %
Platelets: 247 10*3/uL (ref 150–400)
RBC: 3.42 MIL/uL — ABNORMAL LOW (ref 3.87–5.11)
RDW: 14.4 % (ref 11.5–15.5)
WBC: 14.5 10*3/uL — ABNORMAL HIGH (ref 4.0–10.5)
nRBC: 0 % (ref 0.0–0.2)

## 2022-02-08 LAB — FERRITIN: Ferritin: 119 ng/mL (ref 11–307)

## 2022-02-08 LAB — BASIC METABOLIC PANEL
Anion gap: 7 (ref 5–15)
BUN: 20 mg/dL (ref 8–23)
CO2: 31 mmol/L (ref 22–32)
Calcium: 8.7 mg/dL — ABNORMAL LOW (ref 8.9–10.3)
Chloride: 101 mmol/L (ref 98–111)
Creatinine, Ser: 0.57 mg/dL (ref 0.44–1.00)
GFR, Estimated: >60 mL/min (ref >60)
Glucose, Bld: 163 mg/dL — ABNORMAL HIGH (ref 70–99)
Potassium: 3.6 mmol/L (ref 3.5–5.1)
Sodium: 139 mmol/L (ref 135–145)

## 2022-02-08 LAB — HEMOGLOBIN AND HEMATOCRIT, BLOOD
HCT: 29.1 % — ABNORMAL LOW (ref 36.0–46.0)
Hemoglobin: 9.4 g/dL — ABNORMAL LOW (ref 12.0–15.0)

## 2022-02-08 LAB — GLUCOSE, CAPILLARY
Glucose-Capillary: 150 mg/dL — ABNORMAL HIGH (ref 70–99)
Glucose-Capillary: 271 mg/dL — ABNORMAL HIGH (ref 70–99)

## 2022-02-08 LAB — IRON AND TIBC
Iron: 51 ug/dL (ref 28–170)
Saturation Ratios: 20 % (ref 10.4–31.8)
TIBC: 262 ug/dL (ref 250–450)
UIBC: 211 ug/dL

## 2022-02-08 MED ORDER — METOPROLOL TARTRATE 50 MG PO TABS: 50.0000 mg | ORAL_TABLET | Freq: Two times a day (BID) | ORAL | 0 refills | Status: DC

## 2022-02-08 MED ORDER — DOCUSATE SODIUM 100 MG PO CAPS: 100.0000 mg | ORAL_CAPSULE | Freq: Two times a day (BID) | ORAL | 0 refills | Status: DC

## 2022-02-08 MED ORDER — ENSURE ENLIVE PO LIQD: 237.0000 mL | Freq: Two times a day (BID) | ORAL | 12 refills | Status: DC

## 2022-02-08 NOTE — TOC Transition Note (Signed)
Transition of Care [facility]) - CM/SW Discharge Note   Patient Details  Name: Laurie Patel MRN: [id number] Date of Birth: 05-Apr-1928  Transition of Care [facility]) CM/SW Contact:  Truddie Hidden, RN Phone Number: 02/08/2022, 1:34 PM   Clinical Narrative:    Patient and daughter advised of pending discharge today to [facility]. EMS packet arranged. EMS scheduled. Discharge summary and SNF transfer report sent to in HUB TOC signing off         Patient Goals and CMS Choice        Discharge Placement                       Discharge Plan and Services                                     Social Determinants of Health (SDOH) Interventions     Readmission Risk Interventions     No data to display

## 2022-02-08 NOTE — Progress Notes (Signed)
Subjective:  POD #6 s/p medullary fixation for left intertrochanteric hip fracture.   Patient reports left hip pain as mild.  Patient on a bedside commode with her nursing staff during my patient visit.  Objective:   VITALS:   Vitals:   02/07/22 2031 02/08/22 0416 02/08/22 0729 02/08/22 1124  BP: (!) 150/44 (!) 146/47 (!) 153/52 (!) 126/44  Pulse: 78 86 82 64  Resp: (!) 23 (!) 24 16 17   Temp: 98.5 F (36.9 C) 98.2 F (36.8 C) 98.2 F (36.8 C) 98.1 F (36.7 C)  TempSrc:    Oral  SpO2: 92% 94%  94%  Weight:      Height:        PHYSICAL EXAM: Left lower extremity: Patient's dressing has serosanguineous drainage.  She has significant ecchymosis around her left hip which is consistent with her prior exam.  Her thigh compartments are soft compressible and distally she is neurovascular intact.    LABS  Results for orders placed or performed during the hospital encounter of 02/01/22 (from the past 24 hour(s))  Occult blood card to lab, stool     Status: None   Collection Time: 02/07/22  3:19 PM  Result Value Ref Range   Fecal Occult Bld NEGATIVE NEGATIVE  Glucose, capillary     Status: Abnormal   Collection Time: 02/07/22  5:37 PM  Result Value Ref Range   Glucose-Capillary 220 (H) 70 - 99 mg/dL  Hemoglobin and hematocrit, blood     Status: Abnormal   Collection Time: 02/07/22  7:10 PM  Result Value Ref Range   Hemoglobin 9.2 (L) 12.0 - 15.0 g/dL   HCT 02/09/22 (L) 28.3 - 15.1 %  Glucose, capillary     Status: Abnormal   Collection Time: 02/07/22 10:30 PM  Result Value Ref Range   Glucose-Capillary 149 (H) 70 - 99 mg/dL  Hemoglobin and hematocrit, blood     Status: Abnormal   Collection Time: 02/08/22  4:15 AM  Result Value Ref Range   Hemoglobin 9.4 (L) 12.0 - 15.0 g/dL   HCT 02/10/22 (L) 60.7 - 37.1 %  Iron and TIBC     Status: None   Collection Time: 02/08/22  4:15 AM  Result Value Ref Range   Iron 51 28 - 170 ug/dL   TIBC 02/10/22 694 - 854 ug/dL   Saturation Ratios 20 10.4  - 31.8 %   UIBC 211 ug/dL  Ferritin     Status: None   Collection Time: 02/08/22  4:15 AM  Result Value Ref Range   Ferritin 119 11 - 307 ng/mL  CBC with Differential/Platelet     Status: Abnormal   Collection Time: 02/08/22  4:15 AM  Result Value Ref Range   WBC 14.5 (H) 4.0 - 10.5 K/uL   RBC 3.42 (L) 3.87 - 5.11 MIL/uL   Hemoglobin 9.4 (L) 12.0 - 15.0 g/dL   HCT 02/10/22 (L) 03.5 - 00.9 %   MCV 84.5 80.0 - 100.0 fL   MCH 27.5 26.0 - 34.0 pg   MCHC 32.5 30.0 - 36.0 g/dL   RDW 38.1 82.9 - 93.7 %   Platelets 247 150 - 400 K/uL   nRBC 0.0 0.0 - 0.2 %   Neutrophils Relative % 75 %   Neutro Abs 10.7 (H) 1.7 - 7.7 K/uL   Lymphocytes Relative 13 %   Lymphs Abs 1.9 0.7 - 4.0 K/uL   Monocytes Relative 9 %   Monocytes Absolute 1.3 (H) 0.1 - 1.0 K/uL  Eosinophils Relative 1 %   Eosinophils Absolute 0.2 0.0 - 0.5 K/uL   Basophils Relative 0 %   Basophils Absolute 0.1 0.0 - 0.1 K/uL   Immature Granulocytes 2 %   Abs Immature Granulocytes 0.22 (H) 0.00 - 0.07 K/uL  Basic metabolic panel     Status: Abnormal   Collection Time: 02/08/22  4:15 AM  Result Value Ref Range   Sodium 139 135 - 145 mmol/L   Potassium 3.6 3.5 - 5.1 mmol/L   Chloride 101 98 - 111 mmol/L   CO2 31 22 - 32 mmol/L   Glucose, Bld 163 (H) 70 - 99 mg/dL   BUN 20 8 - 23 mg/dL   Creatinine, Ser 5.36 0.44 - 1.00 mg/dL   Calcium 8.7 (L) 8.9 - 10.3 mg/dL   GFR, Estimated >46 >80 mL/min   Anion gap 7 5 - 15  Glucose, capillary     Status: Abnormal   Collection Time: 02/08/22  7:29 AM  Result Value Ref Range   Glucose-Capillary 150 (H) 70 - 99 mg/dL  Glucose, capillary     Status: Abnormal   Collection Time: 02/08/22 12:15 PM  Result Value Ref Range   Glucose-Capillary 271 (H) 70 - 99 mg/dL    No results found.  Assessment/Plan: 6 Days Post-Op   Principal Problem:   Closed intertrochanteric fracture of hip, left, initial encounter [facility]) Active Problems:   Controlled type 2 diabetes mellitus without complication,  without long-term current use of insulin (HCC)   Hyperlipidemia   Chronic diastolic CHF (congestive heart failure) (HCC)   Essential hypertension   Closed fracture of left proximal humerus   Atelectasis   Paroxysmal atrial fibrillation (HCC)   Acute posthemorrhagic anemia  Patient is being discharged to [facility] skilled nursing facility today.  Patient will continue physical therapy and is weightbearing as tolerated on left lower extremity.  We will continue use of her sling and swath for her left proximal humerus fracture.  Patient will follow-up with me in 10 to 14 days for wound check, staple removal and repeat x-rays of her left shoulder and left hip.  I will defer anticoagulation therapy to medicine given the degree of ecchymosis she has been having as well as anemia.    Juanell Fairly , MD 02/08/2022, 2:39 PM

## 2022-02-08 NOTE — Telephone Encounter (Signed)
Patient's daughter, Raylynn Hersh, states patient fell on Friday (02/01/2022) and broke her hip and shoulder on her left side.  Hilda Lias states patient is transferring to [facility] this afternoon.  Hilda Lias states patient is now taking insulin.

## 2022-02-08 NOTE — Progress Notes (Signed)
Physical Therapy Treatment Patient Details Name: Laurie Patel MRN: [id number] DOB: [birth year] Today's Date: 02/08/2022   History of Present Illness Jaydeen Darley. Calamari is a 88 y.o. female who presented to the ER secondary to mechanical fall in home environment, acute onset of L hip, L shoulder pain; admitted for management of L comminuted intertrochanteric hip fracture s/p ORIF (02/02/22), L proximal humeral fx, non-operative, LUE to remain immobilized/NWB Per MD.    PT Comments    Pt transferring to recliner with RN staff upon PT entrance. Requested to brush her teeth, able to do so in sitting with good balance, assist with set up only. Pt educated throughout session on LUE NWB with poor carryover. Sit <> stand three times with mod-maxA, and once in standing provided with RW to stabilize with R hand. Cga-minA in standing for occasional posterior lean. Pre-gait activities in standing, pt fatigued quickly and needed assistance with eccentric control for lowering back to recliner. Pt with all needs in reach at end of session. The patient would benefit from further skilled PT intervention to continue to progress towards goals. Recommendation remains appropriate.       Recommendations for follow up therapy are one component of a multi-disciplinary discharge planning process, led by the attending physician.  Recommendations may be updated based on patient status, additional functional criteria and insurance authorization.  Follow Up Recommendations  Skilled nursing-short term rehab (<3 hours/day) Can patient physically be transported by private vehicle: No   Assistance Recommended at Discharge Frequent or constant Supervision/Assistance  Patient can return home with the following A lot of help with walking and/or transfers;A lot of help with bathing/dressing/bathroom;Assistance with cooking/housework;Direct supervision/assist for medications management;Direct supervision/assist for financial  management;Assist for transportation;Help with stairs or ramp for entrance   Equipment Recommendations  Other (comment) (TBD)    Recommendations for Other Services       Precautions / Restrictions Precautions Precautions: Fall Required Braces or Orthoses: Sling Restrictions Weight Bearing Restrictions: Yes LUE Weight Bearing: Non weight bearing LLE Weight Bearing: Weight bearing as tolerated     Mobility  Bed Mobility               General bed mobility comments: pt transferring to chair with RN staff upon PT entrance    Transfers Overall transfer level: Needs assistance Equipment used: 1 person hand held assist, Rolling walker (2 wheels) Transfers: Sit to/from Stand Sit to Stand: Mod assist, Max assist                Ambulation/Gait               General Gait Details: deferred at this time, pregait activities   Stairs             Wheelchair Mobility    Modified Rankin (Stroke Patients Only)       Balance Overall balance assessment: Needs assistance Sitting-balance support: No upper extremity supported, Feet supported Sitting balance-Leahy Scale: Good     Standing balance support: Single extremity supported, During functional activity Standing balance-Leahy Scale: Poor                              Cognition Arousal/Alertness: Awake/alert Behavior During Therapy: WFL for tasks assessed/performed                                   General Comments: Decreased  safety awarenss, decreased recall of precautions and NWB status in LUE. Decreased awareness of deficits with functional use of LUE and LLE.        Exercises      General Comments        Pertinent Vitals/Pain Pain Assessment Pain Assessment: No/denies pain    Home Living                          Prior Function            PT Goals (current goals can now be found in the care plan section) Progress towards PT goals: Progressing  toward goals    Frequency    BID      PT Plan Current plan remains appropriate    Co-evaluation              AM-PAC PT "6 Clicks" Mobility   Outcome Measure  Help needed turning from your back to your side while in a flat bed without using bedrails?: A Lot Help needed moving from lying on your back to sitting on the side of a flat bed without using bedrails?: A Lot Help needed moving to and from a bed to a chair (including a wheelchair)?: A Lot Help needed standing up from a chair using your arms (e.g., wheelchair or bedside chair)?: A Lot Help needed to walk in hospital room?: A Lot Help needed climbing 3-5 steps with a railing? : Total 6 Click Score: 11    End of Session Equipment Utilized During Treatment: Gait belt Activity Tolerance: Patient tolerated treatment well Patient left: in chair;with call bell/phone within reach;with chair alarm set Nurse Communication: Mobility status PT Visit Diagnosis: Muscle weakness (generalized) (M62.81);Other abnormalities of gait and mobility (R26.89);Pain Pain - Right/Left: Left Pain - part of body: Hip     Time: 1610-9604 PT Time Calculation (min) (ACUTE ONLY): 24 min  Charges:  $Therapeutic Exercise: 23-37 mins                     Olga Coaster PT, DPT 9:58 AM,02/08/22

## 2022-02-08 NOTE — Discharge Summary (Addendum)
Physician Discharge Summary   Patient: Laurie Patel MRN: [id number] DOB: 1928-01-05  Admit date:     02/01/2022  Discharge date: 02/08/22  Discharge Physician: Fran Lowes   PCP: Glori Luis, MD   Recommendations at discharge:    Discharge to SNF Patient should have CBC and chemistry drawn in one week to be reported to facility physician. Follow up with PCP in 7-10 days after discharge from facility. Follow up with orthopedic surgery in 10-14 days. She will have Zio patch placed by cardiology prior to discharge. Follow up with cardiology as directed. Patient is to be weight bearing as tolerated on the left lower extremity and non-weight bearing for left upper extremity. The patient is to wear sling on the left upper extremity .  Discharge Diagnoses: Principal Problem:   Closed intertrochanteric fracture of hip, left, initial encounter [facility]) Active Problems:   Essential hypertension   Chronic diastolic CHF (congestive heart failure) (HCC)   Acute posthemorrhagic anemia   Controlled type 2 diabetes mellitus without complication, without long-term current use of insulin (HCC)   Hyperlipidemia   Closed fracture of left proximal humerus   Atelectasis   Paroxysmal atrial fibrillation (HCC)  Resolved Problems:   * No resolved hospital problems. [facility] Course: Laurie Patel is a 88 y.o. F with HTN, DM, dCHF, and hx LBBB who presented with a mechanical fall and left hip pain.  In the ER, radiograph showed left hip fracture.  Ortho consulted.  The patient underwent ORIF of the left hip on 02/03/2022. She has tolerated the procedure well.    In the early morning hours of 02/04/2022 the patient went into atrial fibrillation with RVR. Cardiology was consulted. The patient's heart rate has converted to sinus rhythm on lopressor. She was started on a heparin drip for stroke prophylaxis.   The patient's hemoglobin has dropped quite a bit since admission and particularly after  heparin drip was started. Hemoglobin has trended downward since admission with Hgb of 12.5. Hgb briefly increased to 14 on the day after admission. Postoperatively the patient's hemoglobin has dropped to 10.4. After heparin drip was started it has dropped to 8.8 although most recent H&H was 9.5. The patient denies any black, maroon, or bloody BM's. FOBT is pending. CT of the abdomen and pelvis ruled out retroperitoneal bleed, but did confirm multiple hematomas in the area of the patient's recent ORIF. FOBT was negative. The patient's hemoglobin has continued to drift down. Anticoagulation and antithrombotics have been discontinued as the patient's hemoglobin continues to decline slowly and she has converted to sinus rhythm. She will have Zio patch placed prior to discharge. She will follow up with cardiology as directed by Dr. Mariah Milling as outpatient. The patient will follow up with orthopedic surgery in 10-14 days. The patient will be non-weightbearing on her left upper extremity which should be kept in a sling. She is to be weight bearing as tolerated  on the left lower extremity.  She will be discharged to SNF today. Assessment and Plan: * Closed intertrochanteric fracture of hip, left, initial encounter (HCC) S/p Intramedullary fixation for left intertrochanteric hip fracturby Dr. Martha Clan.   - Vitamin D 52  - PT eval completed. Recommendation is for SNF. The patient is to be weight bearing as tolerated on the left lower extremity. She is to follow up with orthopedic surgery in 10-14 days.    Essential hypertension The patient's blood pressures are a little high on lisinopril, metoprolol and lasix. Dose of metoprolol  has been increased.   Chronic diastolic CHF (congestive heart failure) (HCC) Appears euvolemic - Continue Lasix  Acute posthemorrhagic anemia Hemoglobin has trended downward since admission with Hgb of 12.5. Hgb briefly increased to 14 on the day after admission. Postoperatively  the patient's hemoglobin has dropped to 10.4. After heparin drip was started it has dropped to 8.8 although most recent H&H was 9.5. The patient denies any black, maroon, or bloody BM's. FOBT is negative. CT of the abdomen and pelvis ruled out retroperitoneal bleed, but did confirm multiple hematomas in the area of the patient's recent ORIF. FOBT was negative. Will continue to monitor hemoglobin. As the patient's hemoglobin has continued to drift down, heparin and antithrombotics have been discontinued by cardiology. They plan to discharge the patient to SNF with a cardiac monitor to determine whether or not stroke prophylaxis is required. The patient is currently in sinus rhythm.  Paroxysmal atrial fibrillation (HCC) New onset 8/14 postoperatively.  Cardiology were consulted, they recommended anticoagulation and started metoprolol. --The patient is currently in sinus rhythm on metoprolol. --Cardiology has stopped heparin and antithrombotics due to declining hemoglobin. The patient will be discharged with a monitor to determine if she is going back into atrial fibrillation and is at risk for stroke. She will not be discharged with anticoagulation out of concern for the patient's ongoing anemia.   Atelectasis Resolving. The patient is currently saturating 94% on room air. Continue incentive spirometry.  Closed fracture of left proximal humerus - Nonweightbearing to left upper extremity - Maintain left arm in sling -Outpatient orthopedics follow-up in 10-14 days  Hyperlipidemia Not on a statin, presumably due to age  Controlled type 2 diabetes mellitus without complication, without long-term current use of insulin (HCC) This is diet controlled.  She had mild hyperglycemia here.  Last hemoglobin A1c was 7.9%.   - Continue SS corrections   Consultants: Orthopedic surgery Procedures performed: Intramedullary nailing of the left hip.  Disposition: Skilled nursing facility Diet recommendation:   Discharge Diet Orders (From admission, onward)     Start     Ordered   02/08/22 0000  Diet - low sodium heart healthy        02/08/22 1343           Carb modified diet DISCHARGE MEDICATION: Allergies as of 02/08/2022       Reactions   Augmentin [amoxicillin-pot Clavulanate] Nausea And Vomiting   Prednisone         Medication List     STOP taking these medications    aspirin EC 81 MG tablet       TAKE these medications    cholecalciferol 25 MCG (1000 UNIT) tablet Commonly known as: VITAMIN D3 Take 1,000 Units by mouth daily.   docusate sodium 100 MG capsule Commonly known as: COLACE Take 1 capsule (100 mg total) by mouth 2 (two) times daily.   feeding supplement Liqd Take 237 mLs by mouth 2 (two) times daily between meals.   furosemide 20 MG tablet Commonly known as: Lasix Take 1 tablet (20 mg total) by mouth daily.   lisinopril 10 MG tablet Commonly known as: ZESTRIL TAKE 1 TABLET BY MOUTH DAILY.   metoprolol tartrate 50 MG tablet Commonly known as: LOPRESSOR Take 1 tablet (50 mg total) by mouth 2 (two) times daily.   ONE-A-DAY WOMENS PO Take 1 tablet by mouth daily.               Discharge Care Instructions  (From admission, onward)  Start     Ordered   02/08/22 0000  Discharge wound care:       Comments: Left hip: Reinforce dressing until discontinued.   02/08/22 1343            Contact information for after-discharge care     Destination     [facility] Preferred SNF .   Service: Skilled Nursing Contact information: [address] [place] [address] [phone]                    Discharge Exam: Ceasar Mons Weights   02/01/22 1819  Weight: 65 kg   Exam:  Constitutional:  The patient is awake, alert, and oriented x 3. No acute distress. Respiratory:  No increased work of breathing. No wheezes, rales,  or rhonchi No tactile fremitus Cardiovascular:  Regular rate and rhythm No murmurs, ectopy, or gallups. No lateral PMI. No thrills. Abdomen:  Abdomen is soft, non-tender, non-distended No hernias, masses, or organomegaly Normoactive bowel sounds.  Musculoskeletal:  No cyanosis, clubbing, or edema Left upper extremity in sling Skin:  No rashes, lesions, ulcers palpation of skin: no induration or nodules Neurologic:  CN 2-12 intact Sensation all 4 extremities intact Psychiatric:  Mental status Mood, affect appropriate Orientation to person, place, time  judgment and insight appear intact   Condition at discharge: fair  The results of significant diagnostics from this hospitalization (including imaging, microbiology, ancillary and laboratory) are listed below for reference.   Imaging Studies: CT ABDOMEN PELVIS WO CONTRAST  Result Date: 02/06/2022 CLINICAL DATA:  Evaluate for retroperitoneal bleed. Status post IM nail. EXAM: CT ABDOMEN AND PELVIS WITHOUT CONTRAST TECHNIQUE: Multidetector CT imaging of the abdomen and pelvis was performed following the standard protocol without IV contrast. RADIATION DOSE REDUCTION: This exam was performed according to the departmental dose-optimization program which includes automated exposure control, adjustment of the mA and/or kV according to patient size and/or use of iterative reconstruction technique. COMPARISON:  None Available. FINDINGS: Lower chest: Lung bases are clear. Hepatobiliary: No focal liver abnormality is seen. No gallstones, gallbladder wall thickening, or biliary dilatation. Pancreas: Along the body of pancreas there is a indeterminate structure containing a few calcifications which measures 2.2 by 1.6 cm, image 27/2. No signs of main duct dilatation or pancreatic inflammation. Spleen: Normal in size without focal abnormality. Adrenals/Urinary Tract: Left adrenal nodule measures 1.6 x 2.1 cm in 7.7 Hounsfield units, image 21/2. There  is a right adrenal nodule which measures 1.7 by 1.1 cm and 9.23 Hounsfield units, image 23/2 these are compatible with lipid rich benign adenomas. No follow-up imaging recommended. Kidneys are unremarkable. Urinary bladder appears normal. Stomach/Bowel: Stomach appears within normal limits. There is moderate diffuse gaseous distension of the colon. There is also a dilated loop of small bowel within the lower abdomen which measures up to 2.9 cm, image 67/2. No signs of bowel wall thickening or inflammation. Vascular/Lymphatic: Aortic atherosclerosis. No signs of aneurysm. No abdominopelvic adenopathy. Reproductive: Uterus and bilateral adnexa are unremarkable. Other: No free fluid or fluid collections. No signs of retroperitoneal hematoma. Musculoskeletal: Postoperative changes from ORIF of recent intertrochanteric fracture of the proximal left femur. There is a hematoma within the subcutaneous soft tissues lateral to the left pelvis which measures 3.2 x 9.0 x 3.7 cm (volume = 56 cm^3), image 63/5. A second hematoma is partially visualized (measuring at least 3.3 x 2.7 cm on the axial images) within the subcutaneous soft  tissues of the left lower extremity, lateral to the proximal left femur, image 60/5. There is diffuse subcutaneous soft tissue stranding within both lower extremities, left greater than right. IMPRESSION: 1. No signs of retroperitoneal hematoma. 2. Hematomas within the subcutaneous soft tissues lateral to the left pelvis and lateral to the proximal left femur. The largest has a volume of approximately 56 cc. 3. Diffuse subcutaneous soft tissue stranding within both lower extremities, left greater than right. 4. Moderate diffuse gaseous distension of the colon with a dilated loop of small bowel within the lower abdomen. Findings are nonspecific and may be related to ileus. 5. Indeterminate, ovoid structure along the body of pancreas containing a few calcifications. This measures up to 2.2 cm. This  does not require emergent attention. When the patient is clinically stable and able to follow directions and hold their breath (preferably as an outpatient) further evaluation with dedicated abdominal MRI should be considered. 6. Aortic Atherosclerosis (ICD10-I70.0). Electronically Signed   By: Signa Kell M.D.   On: 02/06/2022 13:37   ECHOCARDIOGRAM COMPLETE  Result Date: 02/04/2022    ECHOCARDIOGRAM REPORT   Patient Name:   BETHAN ADAMEK Cocozza Date of Exam: 02/04/2022 Medical Rec #:  [id number]      Height:       65.5 in Accession #:    [id number]     Weight:       143.3 lb Date of Birth:  02-10-1928      BSA:          1.727 m Patient Age:    89 years       BP:           131/60 mmHg Patient Gender: F              HR:           90 bpm. Exam Location:  [facility] Procedure: 2D Echo, Cardiac Doppler and Color Doppler Indications:     I48.91 Atrial Fibrillation  History:         Patient has prior history of Echocardiogram examinations, most                  recent 01/30/2021. CHF, Arrythmias:Atrial Fibrillation; Risk                  Factors:Hypertension, Diabetes and Dyslipidemia.  Sonographer:     Ceasar Mons Referring Phys:  [id number] CHRISTOPHER P DANFORD Diagnosing Phys: Yvonne Kendall MD  Sonographer Comments: Suboptimal apical window and suboptimal subcostal window. Image acquisition challenging due to uncooperative patient. IMPRESSIONS  1. Left ventricular ejection fraction, by estimation, is 60 to 65%. The left ventricle has normal function. Left ventricular endocardial border not optimally defined to evaluate regional wall motion. There is moderate asymmetric left ventricular hypertrophy of the septal segment. Left ventricular diastolic parameters are indeterminate.  2. Right ventricular systolic function is normal. The right ventricular size is normal. There is moderately elevated pulmonary artery systolic pressure.  3. Left atrial size was mildly dilated.  4. The mitral valve is abnormal. No evidence of mitral  valve regurgitation.  5. Tricuspid valve regurgitation is mild to moderate.  6. The aortic valve is tricuspid. Aortic valve regurgitation is not visualized. No aortic stenosis is present.  7. The inferior vena cava is normal in size with <50% respiratory variability, suggesting right atrial pressure of 8 mmHg. FINDINGS  Left Ventricle: Left ventricular ejection fraction, by estimation, is 60 to 65%. The left ventricle has normal function. Left ventricular  endocardial border not optimally defined to evaluate regional wall motion. The left ventricular internal cavity size was normal in size. There is moderate asymmetric left ventricular hypertrophy of the septal segment. Left ventricular diastolic parameters are indeterminate. Right Ventricle: The right ventricular size is normal. No increase in right ventricular wall thickness. Right ventricular systolic function is normal. There is moderately elevated pulmonary artery systolic pressure. The tricuspid regurgitant velocity is 3.12 m/s, and with an assumed right atrial pressure of 8 mmHg, the estimated right ventricular systolic pressure is 46.9 mmHg. Left Atrium: Left atrial size was mildly dilated. Right Atrium: Right atrial size was normal in size. Pericardium: There is no evidence of pericardial effusion. Presence of epicardial fat layer. Mitral Valve: The mitral valve is abnormal. There is mild thickening of the mitral valve leaflet(s). There is mild calcification of the mitral valve leaflet(s). Mild to moderate mitral annular calcification. No evidence of mitral valve regurgitation. Tricuspid Valve: The tricuspid valve is normal in structure. Tricuspid valve regurgitation is mild to moderate. Aortic Valve: The aortic valve is tricuspid. Aortic valve regurgitation is not visualized. No aortic stenosis is present. Aortic valve mean gradient measures 4.0 mmHg. Aortic valve peak gradient measures 7.1 mmHg. Aortic valve area, by VTI measures 2.64 cm. Pulmonic Valve:  The pulmonic valve was normal in structure. Pulmonic valve regurgitation is mild to moderate. No evidence of pulmonic stenosis. Aorta: The aortic root and ascending aorta are structurally normal, with no evidence of dilitation. Venous: The inferior vena cava is normal in size with less than 50% respiratory variability, suggesting right atrial pressure of 8 mmHg. IAS/Shunts: The interatrial septum was not well visualized.  LEFT VENTRICLE PLAX 2D LVIDd:         2.91 cm LVIDs:         2.05 cm LV PW:         1.00 cm LV IVS:        1.44 cm LVOT diam:     1.90 cm LV SV:         63 LV SV Index:   36 LVOT Area:     2.84 cm  RIGHT VENTRICLE RV Basal diam:  3.05 cm RV S prime:     11.40 cm/s TAPSE (M-mode): 2.4 cm LEFT ATRIUM           Index        RIGHT ATRIUM           Index LA diam:      3.10 cm 1.80 cm/m   RA Area:     16.50 cm LA Vol (A4C): 61.0 ml 35.33 ml/m  RA Volume:   45.60 ml  26.41 ml/m  AORTIC VALVE AV Area (Vmax):    2.49 cm AV Area (Vmean):   2.34 cm AV Area (VTI):     2.64 cm AV Vmax:           133.00 cm/s AV Vmean:          94.600 cm/s AV VTI:            0.237 m AV Peak Grad:      7.1 mmHg AV Mean Grad:      4.0 mmHg LVOT Vmax:         117.00 cm/s LVOT Vmean:        78.100 cm/s LVOT VTI:          0.221 m LVOT/AV VTI ratio: 0.93  AORTA Ao Root diam: 3.00 cm MITRAL VALVE  TRICUSPID VALVE MV Area (PHT): 3.61 cm     TR Peak grad:   38.9 mmHg MV Decel Time: 210 msec     TR Vmax:        312.00 cm/s MV E velocity: 86.90 cm/s MV A velocity: 127.00 cm/s  SHUNTS MV E/A ratio:  0.68         Systemic VTI:  0.22 m                             Systemic Diam: 1.90 cm Yvonne Kendall MD Electronically signed by Yvonne Kendall MD Signature Date/Time: 02/04/2022/1:58:22 PM    Final    CT Angio Chest Pulmonary Embolism (PE) W or WO Contrast  Result Date: 02/04/2022 CLINICAL DATA:  Pulmonary embolism suspected, positive D-dimer, recent left hip fracture status post repair EXAM: CT ANGIOGRAPHY CHEST WITH  CONTRAST TECHNIQUE: Multidetector CT imaging of the chest was performed using the standard protocol during bolus administration of intravenous contrast. Multiplanar CT image reconstructions and MIPs were obtained to evaluate the vascular anatomy. RADIATION DOSE REDUCTION: This exam was performed according to the departmental dose-optimization program which includes automated exposure control, adjustment of the mA and/or kV according to patient size and/or use of iterative reconstruction technique. CONTRAST:  75mL OMNIPAQUE IOHEXOL 350 MG/ML SOLN COMPARISON:  None Available. FINDINGS: Cardiovascular: Satisfactory opacification of the pulmonary arteries to the segmental level. No evidence of pulmonary embolism. Mild cardiomegaly. Three-vessel coronary artery calcifications. No pericardial effusion. Aortic atherosclerosis. Mediastinum/Nodes: No enlarged mediastinal, hilar, or axillary lymph nodes. Thyroid gland, trachea, and esophagus demonstrate no significant findings. Lungs/Pleura: Trace bilateral pleural effusions and associated atelectasis or consolidation. 0.4 cm nodule of the peripheral right upper lobe (series 6, image 21). Upper Abdomen: No acute abnormality. Definitively benign, macroscopic fat containing bilateral adrenal adenomata, for which no further follow-up or characterization is required (series 5, image 299) Musculoskeletal: No chest wall abnormality. Age indeterminate inferior endplate wedge deformity of the T10 vertebral body (series 8, image 53) Review of the MIP images confirms the above findings. IMPRESSION: 1. Negative examination for pulmonary embolism. 2. Trace bilateral pleural effusions and associated atelectasis or consolidation. 3. Incidental note of a 0.4 cm nodule of the peripheral right upper lobe. No follow-up needed if patient is low-risk.This recommendation follows the consensus statement: Guidelines for Management of Incidental Pulmonary Nodules Detected on CT Images: From the  Fleischner Society 2017; Radiology 2017; 284:228-243. 4. Age indeterminate inferior endplate wedge deformity of the T10 vertebral body. Correlate for acute pain and point tenderness. 5. Coronary artery disease. Aortic Atherosclerosis (ICD10-I70.0). Electronically Signed   By: Jearld Lesch M.D.   On: 02/04/2022 09:07   DG HIP UNILAT WITH PELVIS 2-3 VIEWS LEFT  Result Date: 02/02/2022 CLINICAL DATA:  Fluoroscopic assistance for internal fixation of fracture of left femur EXAM: DG HIP (WITH OR WITHOUT PELVIS) 2-3V LEFT COMPARISON:  02/01/2022 FINDINGS: Fluoroscopic images show reduction and internal fixation of comminuted intertrochanteric fracture of left femur with intramedullary rod. Fluoroscopy time 40 seconds. Radiation dose 4.018 mGy. IMPRESSION: Fluoroscopic assistance was provided for internal fixation of intertrochanteric fracture of left femur. Electronically Signed   By: Ernie Avena M.D.   On: 02/02/2022 13:35   DG Hip Port Unilat With Pelvis 1V Left  Result Date: 02/02/2022 CLINICAL DATA:  Internal fixation of fracture of left femur EXAM: DG HIP (WITH OR WITHOUT PELVIS) 1V PORT LEFT COMPARISON:  02/01/2022 FINDINGS: There is internal fixation of comminuted intertrochanteric fracture  of proximal left femur with intramedullary rod. There are pockets of air in the soft tissues from recent surgery. There are coarse calcifications in the soft tissues medial to the proximal femur and adjacent to the left ischium. This may be residual from previous soft tissue injury. IMPRESSION: Reduction and internal fixation of comminuted intertrochanteric fracture of proximal left femur. Electronically Signed   By: Ernie AvenaPalani  Rathinasamy M.D.   On: 02/02/2022 13:34   DG C-Arm 1-60 Min-No Report  Result Date: 02/02/2022 Fluoroscopy was utilized by the requesting physician.  No radiographic interpretation.   DG Chest 1 View  Result Date: 02/01/2022 CLINICAL DATA:  Trauma, fall EXAM: CHEST  1 VIEW  COMPARISON:  09/08/2021 FINDINGS: Transverse diameter of heart is increased. There are no signs of pulmonary edema or focal pulmonary consolidation. There is no pleural effusion or pneumothorax. There is mild deformity in the posterolateral aspect of left fourth rib, possibly suggesting old fracture. Osteopenia is seen in bony structures. IMPRESSION: Cardiomegaly. There are no signs of pulmonary edema or focal pulmonary consolidation. Electronically Signed   By: Ernie AvenaPalani  Rathinasamy M.D.   On: 02/01/2022 19:16   DG Shoulder Left  Result Date: 02/01/2022 CLINICAL DATA:  Trauma, fall, pain EXAM: LEFT SHOULDER - 2+ VIEW COMPARISON:  None Available. FINDINGS: There is cortical irregularity along the medial margin of neck cough proximal left humerus. There is faint sclerosis in the neck of the left femur. Degenerative changes are noted in left AC joint. Osteopenia is seen in bony structures. Degenerative changes noted in cervical spine. IMPRESSION: Deformity in the neck of the left humerus suggests recent impacted fracture. Less likely possibility would be residual change from previous injury. Electronically Signed   By: Ernie AvenaPalani  Rathinasamy M.D.   On: 02/01/2022 19:14   DG Hip Unilat W or Wo Pelvis 2-3 Views Left  Result Date: 02/01/2022 CLINICAL DATA:  Trauma, fall EXAM: DG HIP (WITH OR WITHOUT PELVIS) 2-3V LEFT COMPARISON:  None Available. FINDINGS: There is comminuted intertrochanteric fracture of proximal left femur. There is slight overriding of fracture fragments in the medial aspect. There is slight medial displacement of lesser trochanter. Osteopenia is seen in bony structures. Degenerative changes are noted in lower lumbar spine. There are soft tissue calcifications adjacent to the ischium. IMPRESSION: Comminuted intertrochanteric fracture is seen in the proximal left femur. Electronically Signed   By: Ernie AvenaPalani  Rathinasamy M.D.   On: 02/01/2022 19:12    Microbiology: No results found for this or any  previous visit.  Labs: CBC: Recent Labs  Lab 02/01/22 2138 02/02/22 0826 02/04/22 0312 02/05/22 0200 02/06/22 0416 02/06/22 1139 02/06/22 1919 02/07/22 0414 02/07/22 1122 02/07/22 1910 02/08/22 0415  WBC 18.9*   < > 18.5* 20.2* 15.3*  --   --  13.9*  --   --  14.5*  NEUTROABS 17.5*  --   --   --   --   --   --   --   --   --  10.7*  HGB 12.5   < > 10.4* 10.2* 8.8*   < > 8.9* 8.5* 9.1* 9.2* 9.4*  9.4*  HCT 41.7   < > 31.8* 31.2* 27.3*   < > 27.5* 26.3* 28.5* 28.4* 28.9*  29.1*  MCV 90.1   < > 84.6 83.6 85.0  --   --  85.1  --   --  84.5  PLT 140*   < > 205 232 225  --   --  226  --   --  247   < > = values in this interval not displayed.   Basic Metabolic Panel: Recent Labs  Lab 02/02/22 0826 02/03/22 0307 02/04/22 0312 02/05/22 0200 02/08/22 0415  NA 138 138 136 135 139  K 4.2 4.1 4.0 3.8 3.6  CL 104 103 99 99 101  CO2 26 30 29 28 31   GLUCOSE 188* 182* 210* 219* 163*  BUN 19 21 29* 33* 20  CREATININE 0.76 0.75 0.82 0.77 0.57  CALCIUM 9.0 9.0 9.3 9.2 8.7*   Liver Function Tests: No results for input(s): "AST", "ALT", "ALKPHOS", "BILITOT", "PROT", "ALBUMIN" in the last 168 hours. CBG: Recent Labs  Lab 02/07/22 1206 02/07/22 1737 02/07/22 2230 02/08/22 0729 02/08/22 1215  GLUCAP 172* 220* 149* 150* 271*    Discharge time spent: greater than 30 minutes.  Signed: Patricie Geeslin, DO [facility] 02/08/2022

## 2022-02-12 ENCOUNTER — Inpatient Hospital Stay: Payer: Medicare Other

## 2022-02-12 DIAGNOSIS — I480 Paroxysmal atrial fibrillation: Secondary | ICD-10-CM

## 2022-02-13 ENCOUNTER — Other Ambulatory Visit: Payer: Self-pay

## 2022-02-13 DIAGNOSIS — I10 Essential (primary) hypertension: Secondary | ICD-10-CM

## 2022-02-13 MED ORDER — LISINOPRIL 10 MG PO TABS: 10.0000 mg | ORAL_TABLET | Freq: Every day | ORAL | 1 refills | Status: DC

## 2022-02-15 DIAGNOSIS — S42202D Unspecified fracture of upper end of left humerus, subsequent encounter for fracture with routine healing: Secondary | ICD-10-CM | POA: Diagnosis not present

## 2022-02-15 DIAGNOSIS — K5904 Chronic idiopathic constipation: Secondary | ICD-10-CM | POA: Diagnosis not present

## 2022-02-15 DIAGNOSIS — E118 Type 2 diabetes mellitus with unspecified complications: Secondary | ICD-10-CM | POA: Diagnosis not present

## 2022-02-15 DIAGNOSIS — J9811 Atelectasis: Secondary | ICD-10-CM | POA: Diagnosis not present

## 2022-02-15 DIAGNOSIS — S72002D Fracture of unspecified part of neck of left femur, subsequent encounter for closed fracture with routine healing: Secondary | ICD-10-CM | POA: Diagnosis not present

## 2022-02-15 DIAGNOSIS — I10 Essential (primary) hypertension: Secondary | ICD-10-CM | POA: Diagnosis not present

## 2022-02-15 DIAGNOSIS — D509 Iron deficiency anemia, unspecified: Secondary | ICD-10-CM | POA: Diagnosis not present

## 2022-02-15 DIAGNOSIS — I480 Paroxysmal atrial fibrillation: Secondary | ICD-10-CM | POA: Diagnosis not present

## 2022-02-15 DIAGNOSIS — S42202A Unspecified fracture of upper end of left humerus, initial encounter for closed fracture: Secondary | ICD-10-CM | POA: Diagnosis not present

## 2022-02-15 DIAGNOSIS — I5042 Chronic combined systolic (congestive) and diastolic (congestive) heart failure: Secondary | ICD-10-CM | POA: Diagnosis not present

## 2022-02-15 DIAGNOSIS — Z79899 Other long term (current) drug therapy: Secondary | ICD-10-CM | POA: Diagnosis not present

## 2022-02-15 DIAGNOSIS — S72142A Displaced intertrochanteric fracture of left femur, initial encounter for closed fracture: Secondary | ICD-10-CM | POA: Diagnosis not present

## 2022-02-18 DIAGNOSIS — E118 Type 2 diabetes mellitus with unspecified complications: Secondary | ICD-10-CM | POA: Diagnosis not present

## 2022-02-18 DIAGNOSIS — S42202D Unspecified fracture of upper end of left humerus, subsequent encounter for fracture with routine healing: Secondary | ICD-10-CM | POA: Diagnosis not present

## 2022-02-18 DIAGNOSIS — I10 Essential (primary) hypertension: Secondary | ICD-10-CM | POA: Diagnosis not present

## 2022-02-18 DIAGNOSIS — K5904 Chronic idiopathic constipation: Secondary | ICD-10-CM | POA: Diagnosis not present

## 2022-02-18 DIAGNOSIS — I480 Paroxysmal atrial fibrillation: Secondary | ICD-10-CM | POA: Diagnosis not present

## 2022-02-18 DIAGNOSIS — J9811 Atelectasis: Secondary | ICD-10-CM | POA: Diagnosis not present

## 2022-02-18 DIAGNOSIS — D509 Iron deficiency anemia, unspecified: Secondary | ICD-10-CM | POA: Diagnosis not present

## 2022-02-18 DIAGNOSIS — S72002D Fracture of unspecified part of neck of left femur, subsequent encounter for closed fracture with routine healing: Secondary | ICD-10-CM | POA: Diagnosis not present

## 2022-02-18 DIAGNOSIS — I5042 Chronic combined systolic (congestive) and diastolic (congestive) heart failure: Secondary | ICD-10-CM | POA: Diagnosis not present

## 2022-02-19 DIAGNOSIS — K5904 Chronic idiopathic constipation: Secondary | ICD-10-CM | POA: Diagnosis not present

## 2022-02-19 DIAGNOSIS — S72002D Fracture of unspecified part of neck of left femur, subsequent encounter for closed fracture with routine healing: Secondary | ICD-10-CM | POA: Diagnosis not present

## 2022-02-19 DIAGNOSIS — I5042 Chronic combined systolic (congestive) and diastolic (congestive) heart failure: Secondary | ICD-10-CM | POA: Diagnosis not present

## 2022-02-19 DIAGNOSIS — S42202D Unspecified fracture of upper end of left humerus, subsequent encounter for fracture with routine healing: Secondary | ICD-10-CM | POA: Diagnosis not present

## 2022-02-19 DIAGNOSIS — I480 Paroxysmal atrial fibrillation: Secondary | ICD-10-CM | POA: Diagnosis not present

## 2022-02-19 DIAGNOSIS — I10 Essential (primary) hypertension: Secondary | ICD-10-CM | POA: Diagnosis not present

## 2022-02-19 DIAGNOSIS — J9811 Atelectasis: Secondary | ICD-10-CM | POA: Diagnosis not present

## 2022-02-19 DIAGNOSIS — D509 Iron deficiency anemia, unspecified: Secondary | ICD-10-CM | POA: Diagnosis not present

## 2022-02-19 DIAGNOSIS — E118 Type 2 diabetes mellitus with unspecified complications: Secondary | ICD-10-CM | POA: Diagnosis not present

## 2022-02-22 DIAGNOSIS — D509 Iron deficiency anemia, unspecified: Secondary | ICD-10-CM | POA: Diagnosis not present

## 2022-02-22 DIAGNOSIS — K5904 Chronic idiopathic constipation: Secondary | ICD-10-CM | POA: Diagnosis not present

## 2022-02-22 DIAGNOSIS — I5042 Chronic combined systolic (congestive) and diastolic (congestive) heart failure: Secondary | ICD-10-CM | POA: Diagnosis not present

## 2022-02-22 DIAGNOSIS — S42202D Unspecified fracture of upper end of left humerus, subsequent encounter for fracture with routine healing: Secondary | ICD-10-CM | POA: Diagnosis not present

## 2022-02-22 DIAGNOSIS — S72002D Fracture of unspecified part of neck of left femur, subsequent encounter for closed fracture with routine healing: Secondary | ICD-10-CM | POA: Diagnosis not present

## 2022-02-22 DIAGNOSIS — I480 Paroxysmal atrial fibrillation: Secondary | ICD-10-CM | POA: Diagnosis not present

## 2022-02-22 DIAGNOSIS — I10 Essential (primary) hypertension: Secondary | ICD-10-CM | POA: Diagnosis not present

## 2022-02-22 DIAGNOSIS — E118 Type 2 diabetes mellitus with unspecified complications: Secondary | ICD-10-CM | POA: Diagnosis not present

## 2022-02-22 DIAGNOSIS — J9811 Atelectasis: Secondary | ICD-10-CM | POA: Diagnosis not present

## 2022-02-26 ENCOUNTER — Telehealth: Payer: Self-pay

## 2022-02-26 NOTE — Telephone Encounter (Signed)
Transition Care Management Unsuccessful Follow-up Telephone Call  Date of discharge and from where:  02/23/22 [facility]  Attempts:  1st Attempt  Reason for unsuccessful TCM follow-up call:  Unable to reach patient   Transfer from [employer] to home 02/23/22. Will follow as appropriate.

## 2022-02-28 ENCOUNTER — Ambulatory Visit: Payer: Medicare Other | Admitting: Cardiology

## 2022-02-28 DIAGNOSIS — E119 Type 2 diabetes mellitus without complications: Secondary | ICD-10-CM | POA: Diagnosis not present

## 2022-02-28 DIAGNOSIS — I5032 Chronic diastolic (congestive) heart failure: Secondary | ICD-10-CM | POA: Diagnosis not present

## 2022-02-28 DIAGNOSIS — Z9181 History of falling: Secondary | ICD-10-CM | POA: Diagnosis not present

## 2022-02-28 DIAGNOSIS — D62 Acute posthemorrhagic anemia: Secondary | ICD-10-CM | POA: Diagnosis not present

## 2022-02-28 DIAGNOSIS — I480 Paroxysmal atrial fibrillation: Secondary | ICD-10-CM | POA: Diagnosis not present

## 2022-02-28 DIAGNOSIS — S42202D Unspecified fracture of upper end of left humerus, subsequent encounter for fracture with routine healing: Secondary | ICD-10-CM | POA: Diagnosis not present

## 2022-02-28 DIAGNOSIS — S72142D Displaced intertrochanteric fracture of left femur, subsequent encounter for closed fracture with routine healing: Secondary | ICD-10-CM | POA: Diagnosis not present

## 2022-02-28 DIAGNOSIS — I110 Hypertensive heart disease with heart failure: Secondary | ICD-10-CM | POA: Diagnosis not present

## 2022-02-28 NOTE — Telephone Encounter (Signed)
Transition Care Management Unsuccessful Follow-up Telephone Call  Date of discharge and from where:  02/23/22 [facility]  Attempts:  2nd Attempt  Reason for unsuccessful TCM follow-up call:  Unable to reach patient. Will follow.

## 2022-03-01 NOTE — Telephone Encounter (Signed)
Patient notes she is doing great post discharge and will follow up next month with PCP. No HFU/TCM scheduled at this time.

## 2022-03-05 DIAGNOSIS — Z9181 History of falling: Secondary | ICD-10-CM | POA: Diagnosis not present

## 2022-03-05 DIAGNOSIS — S72142D Displaced intertrochanteric fracture of left femur, subsequent encounter for closed fracture with routine healing: Secondary | ICD-10-CM | POA: Diagnosis not present

## 2022-03-05 DIAGNOSIS — E119 Type 2 diabetes mellitus without complications: Secondary | ICD-10-CM | POA: Diagnosis not present

## 2022-03-05 DIAGNOSIS — I480 Paroxysmal atrial fibrillation: Secondary | ICD-10-CM | POA: Diagnosis not present

## 2022-03-05 DIAGNOSIS — I5032 Chronic diastolic (congestive) heart failure: Secondary | ICD-10-CM | POA: Diagnosis not present

## 2022-03-05 DIAGNOSIS — S42202D Unspecified fracture of upper end of left humerus, subsequent encounter for fracture with routine healing: Secondary | ICD-10-CM | POA: Diagnosis not present

## 2022-03-05 DIAGNOSIS — D62 Acute posthemorrhagic anemia: Secondary | ICD-10-CM | POA: Diagnosis not present

## 2022-03-05 DIAGNOSIS — I110 Hypertensive heart disease with heart failure: Secondary | ICD-10-CM | POA: Diagnosis not present

## 2022-03-06 ENCOUNTER — Telehealth: Payer: Self-pay | Admitting: Family Medicine

## 2022-03-06 NOTE — Telephone Encounter (Signed)
I called and spoke with the patient and she stated that due to her Hip replacement she can't get out right now so she declined the visit. I suggested she call the surgeon and maybe they can give her some suggestions and she stated she would do that.  Nikolaos Maddocks,cma

## 2022-03-06 NOTE — Telephone Encounter (Signed)
Noted. This could be a virtual visit if needed.

## 2022-03-06 NOTE — Telephone Encounter (Signed)
Patient was in rehab for hip. When in rehab she had adjustable bed. She is home and is having a hard time falling asleep. She wanted to know if a mild sleeping pill would help her.

## 2022-03-06 NOTE — Telephone Encounter (Signed)
She would need to have an appointment to discuss this and determine what possible treatment options are.

## 2022-03-07 DIAGNOSIS — S42202D Unspecified fracture of upper end of left humerus, subsequent encounter for fracture with routine healing: Secondary | ICD-10-CM | POA: Diagnosis not present

## 2022-03-07 DIAGNOSIS — I5032 Chronic diastolic (congestive) heart failure: Secondary | ICD-10-CM | POA: Diagnosis not present

## 2022-03-07 DIAGNOSIS — Z9181 History of falling: Secondary | ICD-10-CM | POA: Diagnosis not present

## 2022-03-07 DIAGNOSIS — S72142D Displaced intertrochanteric fracture of left femur, subsequent encounter for closed fracture with routine healing: Secondary | ICD-10-CM | POA: Diagnosis not present

## 2022-03-07 DIAGNOSIS — D62 Acute posthemorrhagic anemia: Secondary | ICD-10-CM | POA: Diagnosis not present

## 2022-03-07 DIAGNOSIS — I110 Hypertensive heart disease with heart failure: Secondary | ICD-10-CM | POA: Diagnosis not present

## 2022-03-07 DIAGNOSIS — I480 Paroxysmal atrial fibrillation: Secondary | ICD-10-CM | POA: Diagnosis not present

## 2022-03-07 DIAGNOSIS — E119 Type 2 diabetes mellitus without complications: Secondary | ICD-10-CM | POA: Diagnosis not present

## 2022-03-08 NOTE — Telephone Encounter (Signed)
I called the patient and she declined the visit and stated she is working with physical therapy on it and there is no more problems with sleep.  Chalon Zobrist,cma

## 2022-03-14 DIAGNOSIS — I5032 Chronic diastolic (congestive) heart failure: Secondary | ICD-10-CM | POA: Diagnosis not present

## 2022-03-14 DIAGNOSIS — S72142D Displaced intertrochanteric fracture of left femur, subsequent encounter for closed fracture with routine healing: Secondary | ICD-10-CM | POA: Diagnosis not present

## 2022-03-14 DIAGNOSIS — S42202D Unspecified fracture of upper end of left humerus, subsequent encounter for fracture with routine healing: Secondary | ICD-10-CM | POA: Diagnosis not present

## 2022-03-14 DIAGNOSIS — Z9181 History of falling: Secondary | ICD-10-CM | POA: Diagnosis not present

## 2022-03-14 DIAGNOSIS — E119 Type 2 diabetes mellitus without complications: Secondary | ICD-10-CM | POA: Diagnosis not present

## 2022-03-14 DIAGNOSIS — I480 Paroxysmal atrial fibrillation: Secondary | ICD-10-CM | POA: Diagnosis not present

## 2022-03-14 DIAGNOSIS — D62 Acute posthemorrhagic anemia: Secondary | ICD-10-CM | POA: Diagnosis not present

## 2022-03-14 DIAGNOSIS — I110 Hypertensive heart disease with heart failure: Secondary | ICD-10-CM | POA: Diagnosis not present

## 2022-03-18 DIAGNOSIS — S42202A Unspecified fracture of upper end of left humerus, initial encounter for closed fracture: Secondary | ICD-10-CM | POA: Diagnosis not present

## 2022-03-18 DIAGNOSIS — S82821A Torus fracture of lower end of right fibula, initial encounter for closed fracture: Secondary | ICD-10-CM | POA: Diagnosis not present

## 2022-03-21 ENCOUNTER — Ambulatory Visit: Payer: Medicare Other | Admitting: Podiatry

## 2022-03-21 ENCOUNTER — Encounter: Payer: Self-pay | Admitting: Podiatry

## 2022-03-21 DIAGNOSIS — E119 Type 2 diabetes mellitus without complications: Secondary | ICD-10-CM

## 2022-03-21 DIAGNOSIS — B351 Tinea unguium: Secondary | ICD-10-CM | POA: Diagnosis not present

## 2022-03-21 DIAGNOSIS — M79676 Pain in unspecified toe(s): Secondary | ICD-10-CM

## 2022-03-21 NOTE — Progress Notes (Signed)
This patient returns to my office for at risk foot care.  This patient requires this care by a professional since this patient will be at risk due to having diabetes.   This patient is unable to cut nails herself since the patient cannot reach her nails.These nails are painful walking and wearing shoes.  This patient presents for at risk foot care today.  General Appearance  Alert, conversant and in no acute stress.  Vascular  Dorsalis pedis and posterior tibial  pulses are weakly  palpable  bilaterally.  Capillary return is within normal limits  bilaterally. Temperature is within normal limits  bilaterally.  Neurologic  Senn-Weinstein monofilament wire test within normal limits  bilaterally. Muscle power within normal limits bilaterally.  Nails Thick disfigured discolored nails with subungual debris  from hallux to fifth toenails left and 2-5 toenails right..  Absence of right hallux nail. No evidence of bacterial infection or drainage bilaterally.  Orthopedic  No limitations of motion  feet .  No crepitus or effusions noted.  No bony pathology or digital deformities noted.  Skin  normotropic skin with no porokeratosis noted bilaterally.  No signs of infections or ulcers noted.     Onychomycosis  Pain in right toes  Pain in left toes  Consent was obtained for treatment procedures.   Mechanical debridement of nails 1-5  Left and 2-5 right  performed with a nail nipper with the exception right hallux toenail.Danley Danker with dremel without incident.  Filed sharp nail right hallux nail bed.   Return office visit  10 weeks                    Told patient to return for periodic foot care and evaluation due to potential at risk complications.   Gardiner Barefoot DPM

## 2022-03-25 DIAGNOSIS — D62 Acute posthemorrhagic anemia: Secondary | ICD-10-CM | POA: Diagnosis not present

## 2022-03-25 DIAGNOSIS — E119 Type 2 diabetes mellitus without complications: Secondary | ICD-10-CM | POA: Diagnosis not present

## 2022-03-25 DIAGNOSIS — Z9181 History of falling: Secondary | ICD-10-CM | POA: Diagnosis not present

## 2022-03-25 DIAGNOSIS — S72142D Displaced intertrochanteric fracture of left femur, subsequent encounter for closed fracture with routine healing: Secondary | ICD-10-CM | POA: Diagnosis not present

## 2022-03-25 DIAGNOSIS — I110 Hypertensive heart disease with heart failure: Secondary | ICD-10-CM | POA: Diagnosis not present

## 2022-03-25 DIAGNOSIS — I480 Paroxysmal atrial fibrillation: Secondary | ICD-10-CM | POA: Diagnosis not present

## 2022-03-25 DIAGNOSIS — S42202D Unspecified fracture of upper end of left humerus, subsequent encounter for fracture with routine healing: Secondary | ICD-10-CM | POA: Diagnosis not present

## 2022-03-25 DIAGNOSIS — I5032 Chronic diastolic (congestive) heart failure: Secondary | ICD-10-CM | POA: Diagnosis not present

## 2022-03-27 DIAGNOSIS — S72142D Displaced intertrochanteric fracture of left femur, subsequent encounter for closed fracture with routine healing: Secondary | ICD-10-CM | POA: Diagnosis not present

## 2022-03-27 DIAGNOSIS — E119 Type 2 diabetes mellitus without complications: Secondary | ICD-10-CM | POA: Diagnosis not present

## 2022-03-27 DIAGNOSIS — I110 Hypertensive heart disease with heart failure: Secondary | ICD-10-CM | POA: Diagnosis not present

## 2022-03-27 DIAGNOSIS — S42202D Unspecified fracture of upper end of left humerus, subsequent encounter for fracture with routine healing: Secondary | ICD-10-CM | POA: Diagnosis not present

## 2022-03-27 DIAGNOSIS — D62 Acute posthemorrhagic anemia: Secondary | ICD-10-CM | POA: Diagnosis not present

## 2022-03-27 DIAGNOSIS — I480 Paroxysmal atrial fibrillation: Secondary | ICD-10-CM | POA: Diagnosis not present

## 2022-03-27 DIAGNOSIS — I5032 Chronic diastolic (congestive) heart failure: Secondary | ICD-10-CM | POA: Diagnosis not present

## 2022-03-27 DIAGNOSIS — Z9181 History of falling: Secondary | ICD-10-CM | POA: Diagnosis not present

## 2022-04-12 ENCOUNTER — Ambulatory Visit: Payer: Medicare Other | Admitting: Cardiology

## 2022-04-15 ENCOUNTER — Ambulatory Visit: Payer: Medicare Other | Admitting: Family Medicine

## 2022-05-06 ENCOUNTER — Telehealth: Payer: Self-pay | Admitting: Family Medicine

## 2022-05-06 NOTE — Telephone Encounter (Signed)
Pt called in stating her BP is low, sugar 121 and  she is lightheaded sent to access nurse

## 2022-05-06 NOTE — Telephone Encounter (Signed)
Patient is scheduled to be seen by the provider tomorrow.  Sierra Bissonette,cma  

## 2022-05-07 ENCOUNTER — Ambulatory Visit (INDEPENDENT_AMBULATORY_CARE_PROVIDER_SITE_OTHER): Payer: Medicare Other | Admitting: Family Medicine

## 2022-05-07 ENCOUNTER — Encounter: Payer: Self-pay | Admitting: Family Medicine

## 2022-05-07 VITALS — BP 122/76 | HR 72 | Temp 97.9°F | Resp 14 | Ht 65.5 in | Wt 144.6 lb

## 2022-05-07 DIAGNOSIS — I480 Paroxysmal atrial fibrillation: Secondary | ICD-10-CM

## 2022-05-07 DIAGNOSIS — D62 Acute posthemorrhagic anemia: Secondary | ICD-10-CM | POA: Diagnosis not present

## 2022-05-07 DIAGNOSIS — J309 Allergic rhinitis, unspecified: Secondary | ICD-10-CM | POA: Diagnosis not present

## 2022-05-07 DIAGNOSIS — E119 Type 2 diabetes mellitus without complications: Secondary | ICD-10-CM

## 2022-05-07 DIAGNOSIS — R42 Dizziness and giddiness: Secondary | ICD-10-CM | POA: Diagnosis not present

## 2022-05-07 DIAGNOSIS — R000 Tachycardia, unspecified: Secondary | ICD-10-CM | POA: Diagnosis not present

## 2022-05-07 LAB — COMPREHENSIVE METABOLIC PANEL
ALT: 10 U/L (ref 0–35)
AST: 11 U/L (ref 0–37)
Albumin: 4.1 g/dL (ref 3.5–5.2)
Alkaline Phosphatase: 119 U/L — ABNORMAL HIGH (ref 39–117)
BUN: 13 mg/dL (ref 6–23)
CO2: 33 mEq/L — ABNORMAL HIGH (ref 19–32)
Calcium: 9.9 mg/dL (ref 8.4–10.5)
Chloride: 97 mEq/L (ref 96–112)
Creatinine, Ser: 0.64 mg/dL (ref 0.40–1.20)
GFR: 76.58 mL/min (ref >60.00)
Glucose, Bld: 238 mg/dL — ABNORMAL HIGH (ref 70–99)
Potassium: 3.9 mEq/L (ref 3.5–5.1)
Sodium: 139 mEq/L (ref 135–145)
Total Bilirubin: 0.3 mg/dL (ref 0.2–1.2)
Total Protein: 6.5 g/dL (ref 6.0–8.3)

## 2022-05-07 LAB — CBC
HCT: 44.8 % (ref 36.0–46.0)
Hemoglobin: 14.1 g/dL (ref 12.0–15.0)
MCHC: 31.5 g/dL (ref 30.0–36.0)
MCV: 80.5 fl (ref 78.0–100.0)
Platelets: 262 10*3/uL (ref 150.0–400.0)
RBC: 5.57 Mil/uL — ABNORMAL HIGH (ref 3.87–5.11)
RDW: 15.4 % (ref 11.5–15.5)
WBC: 11.2 10*3/uL — ABNORMAL HIGH (ref 4.0–10.5)

## 2022-05-07 LAB — HEMOGLOBIN A1C: Hgb A1c MFr Bld: 8.5 % — ABNORMAL HIGH (ref 4.6–6.5)

## 2022-05-07 LAB — TSH: TSH: 2.5 u[IU]/mL (ref 0.35–5.50)

## 2022-05-07 NOTE — Assessment & Plan Note (Signed)
Suspect postnasal drip and cough are related to allergic rhinitis.  She could trial over-the-counter Allegra and see if that is beneficial.

## 2022-05-07 NOTE — Assessment & Plan Note (Signed)
This could be related to dehydration, inadequate food intake, her blood pressure dropping, atrial fibrillation, or anemia.  EKG completed today.  Lab work as outlined.

## 2022-05-07 NOTE — Assessment & Plan Note (Signed)
Check A1c.  We will also check glucose levels to help determine if her watch that is checking her sugar is accurate.  Her sugar was 128 in the office on the watch.

## 2022-05-07 NOTE — Telephone Encounter (Signed)
Patient forgot to tell Dr Birdie Sons that when she fell she also broke her shoulder. Patient states that all the issues she is having is from her shoulder.

## 2022-05-07 NOTE — Assessment & Plan Note (Signed)
Occurred postoperatively.  Today she appears to have sinus rhythm.  She will see cardiology as planned.  It is certainly possible that her issues with tachycardia and lightheadedness could be related to an A-fib episode.  Discussed if she has persistent palpitations or lightheadedness its not resolving quickly she should seek medical attention.

## 2022-05-07 NOTE — Telephone Encounter (Signed)
Noted. She should continue to see orthopedics for the shoulder issue.

## 2022-05-07 NOTE — Patient Instructions (Signed)
Nice to see you. We will get lab work today. Please try the Allegra to see if that helps with your cough and postnasal drip. We will check lab work today and contact you with the results. If you have persistent palpitations or lightheadedness that does not resolve quickly please seek medical attention immediately.

## 2022-05-07 NOTE — Assessment & Plan Note (Signed)
Patient with anemia in the hospital.  We will recheck today.

## 2022-05-07 NOTE — Progress Notes (Signed)
Tommi Rumps, MD Phone: [phone]  Laurie Patel is a 88 y.o. female who presents today for f/u.  Hypertension: Patient is taking lisinopril and Lasix.  She notes no chest pain.  At times she gets short of breath which she feels is related to nasal congestion.  She has had some edema particularly in her left leg since she had a broken hip repair a few months ago.  She does note some shortness of breath on exertion.    Lightheadedness: Patient notes on Sunday she tried sleeping on her side and had some trouble breathing so she got up and got lightheaded.  Her blood pressure was 98/70 and her heart rate was 122.  She took 3 baby aspirin.  She felt as though she had not really eaten enough and her daughter came home and made her a bacon cheeseburger and then she felt better.  She still has a little bit of lightheadedness.  She was diagnosed with atrial fibrillation postop in the hospital.  She was also diagnosed with anemia.  Her downtrending hemoglobin caused him to stop her Eliquis.    Diabetes: Patient notes her blood sugar was 121 on Monday.  Typically its been in the 80-90 range.  She is using a transcutaneous sensor to check this that does not use any kind of blood product to evaluate glucose levels.  Cough: Patient notes some nasal congestion and postnasal drip that is been ongoing since August.  She occasionally has cough.  She notes cough is nonproductive.  No fevers.  She did take Allegra 1 day this past weekend and it did provide some benefit.  Social History   Tobacco Use  Smoking Status Never  Smokeless Tobacco Never    Current Outpatient Medications on File Prior to Visit  Medication Sig Dispense Refill   cholecalciferol (VITAMIN D3) 25 MCG (1000 UNIT) tablet Take 1,000 Units by mouth daily.     feeding supplement (ENSURE ENLIVE / ENSURE PLUS) LIQD Take 237 mLs by mouth 2 (two) times daily between meals. 237 mL 12   furosemide (LASIX) 20 MG tablet Take 1 tablet (20 mg  total) by mouth daily. 90 tablet 1   lisinopril (ZESTRIL) 10 MG tablet Take 1 tablet (10 mg total) by mouth daily. 90 tablet 1   Multiple Vitamins-Calcium (ONE-A-DAY WOMENS PO) Take 1 tablet by mouth daily.     docusate sodium (COLACE) 100 MG capsule Take 1 capsule (100 mg total) by mouth 2 (two) times daily. (Patient not taking: Reported on 05/07/2022) 10 capsule 0   metoprolol tartrate (LOPRESSOR) 50 MG tablet Take 1 tablet (50 mg total) by mouth 2 (two) times daily. (Patient not taking: Reported on 05/07/2022) 60 tablet 0   No current facility-administered medications on file prior to visit.     ROS see history of present illness  Objective  Physical Exam Vitals:   05/07/22 0837  BP: 122/76  Pulse: 72  Resp: 14  Temp: 97.9 F (36.6 C)  SpO2: 96%    BP Readings from Last 3 Encounters:  05/07/22 122/76  02/08/22 (!) 126/44  01/15/22 140/80   Wt Readings from Last 3 Encounters:  05/07/22 144 lb 9.6 oz (65.6 kg)  02/01/22 143 lb 4.8 oz (65 kg)  01/15/22 144 lb 9.6 oz (65.6 kg)    Physical Exam Constitutional:      General: She is not in acute distress.    Appearance: She is not diaphoretic.  Cardiovascular:     Rate and Rhythm: Tachycardia  present. Rhythm irregularly irregular.     Heart sounds: Normal heart sounds.  Pulmonary:     Effort: Pulmonary effort is normal.     Breath sounds: Normal breath sounds.  Skin:    General: Skin is warm and dry.  Neurological:     Mental Status: She is alert.    EKG: Sinus tachycardia with PACs and PVCs, left bundle branch block  Assessment/Plan: Please see individual problem list.  Problem List Items Addressed This Visit     Allergic rhinitis (Chronic)    Suspect postnasal drip and cough are related to allergic rhinitis.  She could trial over-the-counter Allegra and see if that is beneficial.      Controlled type 2 diabetes mellitus without complication, without long-term current use of insulin (HCC) - Primary (Chronic)     Check A1c.  We will also check glucose levels to help determine if her watch that is checking her sugar is accurate.  Her sugar was 128 in the office on the watch.      Relevant Orders   HgB A1c   Acute posthemorrhagic anemia    Patient with anemia in the hospital.  We will recheck today.      Relevant Orders   CBC   Lightheadedness    This could be related to dehydration, inadequate food intake, her blood pressure dropping, atrial fibrillation, or anemia.  EKG completed today.  Lab work as outlined.      Relevant Orders   CBC   TSH   Comp Met (CMET)   Paroxysmal atrial fibrillation (HCC)    Occurred postoperatively.  Today she appears to have sinus rhythm.  She will see cardiology as planned.  It is certainly possible that her issues with tachycardia and lightheadedness could be related to an A-fib episode.  Discussed if she has persistent palpitations or lightheadedness its not resolving quickly she should seek medical attention.      Other Visit Diagnoses     Tachycardia       Relevant Orders   EKG 12-Lead (Completed)   CBC   TSH   Comp Met (CMET)        Return in about 6 weeks (around 06/18/2022).   Tommi Rumps, MD [place]

## 2022-05-08 DIAGNOSIS — S42202A Unspecified fracture of upper end of left humerus, initial encounter for closed fracture: Secondary | ICD-10-CM | POA: Diagnosis not present

## 2022-05-08 DIAGNOSIS — S72142A Displaced intertrochanteric fracture of left femur, initial encounter for closed fracture: Secondary | ICD-10-CM | POA: Diagnosis not present

## 2022-05-10 ENCOUNTER — Telehealth: Payer: Self-pay

## 2022-05-10 NOTE — Telephone Encounter (Signed)
Patient called back and stated she did not want the Tradjenta because before it made her very constipated, she wanted to know if you can prescribe something cheaper, she heard about the jardiance and she wants to know if she can try that, I informed her I would let you know and call to let her know the outcome. Calee Nugent,cma

## 2022-05-13 ENCOUNTER — Telehealth: Payer: Self-pay

## 2022-05-13 MED ORDER — EMPAGLIFLOZIN 10 MG PO TABS: 10.0000 mg | ORAL_TABLET | Freq: Every day | ORAL | 3 refills | Status: DC

## 2022-05-13 NOTE — Telephone Encounter (Signed)
I called and spoke with the patient and informed her that the provider stated she could try jardiance and patient stated she read up about it and she has changed her mind and I will call and cancel the prescription. Patient stated she will stay with the Tradjenta.  Terry [place]otin,cma

## 2022-05-13 NOTE — Telephone Encounter (Signed)
We can try Jardiance 10 mg once daily.  I will send this to her pharmacy.  There is some risk of urinary tract infections and yeast infections with this medication and if she has symptoms of those she needs to let us know.  If she ever develops an illness where she has vomiting, diarrhea, or poor oral intake she needs to stop the Jardiance while she has those symptoms and she could resume the [place] after the symptoms resolve.

## 2022-05-13 NOTE — Telephone Encounter (Signed)
Patient's daughter dropped off paperwork for Dr. Marikay Alar.  Paper is in Dr. Purvis Sheffield color folder.

## 2022-05-14 MED ORDER — LINAGLIPTIN 5 MG PO TABS: 5.0000 mg | ORAL_TABLET | Freq: Every day | ORAL | Status: DC

## 2022-05-14 NOTE — Addendum Note (Signed)
Addended by: Glori Luis on: 05/14/2022 01:34 PM   Modules accepted: Orders

## 2022-05-14 NOTE — Telephone Encounter (Signed)
Noted. Please fill in all the information for our office and the dosing information for the tradjenta. Then I can sign.

## 2022-05-14 NOTE — Telephone Encounter (Signed)
Paperwork filled out and placed back in the needs to be signed basket for your signature

## 2022-05-14 NOTE — Telephone Encounter (Signed)
Noted. Tradjenta placed back on her medication list.

## 2022-05-15 NOTE — Telephone Encounter (Signed)
Signed. Please fax.

## 2022-05-27 ENCOUNTER — Ambulatory Visit: Payer: Medicare Other | Attending: Cardiology | Admitting: Cardiology

## 2022-05-27 ENCOUNTER — Encounter: Payer: Self-pay | Admitting: Cardiology

## 2022-05-27 VITALS — BP 172/82 | HR 90 | Ht 65.5 in | Wt 144.6 lb

## 2022-05-27 DIAGNOSIS — I5030 Unspecified diastolic (congestive) heart failure: Secondary | ICD-10-CM

## 2022-05-27 DIAGNOSIS — I480 Paroxysmal atrial fibrillation: Secondary | ICD-10-CM

## 2022-05-27 DIAGNOSIS — I10 Essential (primary) hypertension: Secondary | ICD-10-CM | POA: Diagnosis not present

## 2022-05-27 MED ORDER — METOPROLOL SUCCINATE ER 25 MG PO TB24: 25.0000 mg | ORAL_TABLET | Freq: Every day | ORAL | 0 refills | Status: DC

## 2022-05-27 NOTE — Progress Notes (Signed)
Cardiology Office Note:    Date:  05/27/2022   ID:  Laurie Patel, DOB Mar 08, 1928, MRN [id number]  PCP:  Glori Luis, MD   [facility] Providers Cardiologist:  Debbe Odea, MD     Referring MD: Glori Luis, MD   Chief Complaint  Patient presents with   Follow-up    6 month f/u,    History of Present Illness:    Laurie Patel is a 88 y.o. female with a hx of HFpEF, hypertension, paroxysmal atrial fibrillation, whitecoat syndrome, diabetes, LVH who presents for follow-up.    Recently seen in the hospital after a fall, she felt dizzy/vertigo, prior to falling.  Sustained left hip fracture, underwent surgery.  Postoperatively, patient went into atrial fibrillation managed with beta-blockers with spontaneous conversion.  Anticoagulation/heparin was started, drop in hemoglobin noted.  Long-term anticoagulation not recommended due to fall risk and anemia on anticoagulation.  Discharged on Lopressor 50 mg twice daily.  She states taking metoprolol at rehab 50 mg in the evening, this caused insomnia, patient stopped taking metoprolol.  Denies palpitations, feels dizzy a couple of times, but no falls .Marland Kitchen  Prior notes  Echo 01/2022 EF 60 to 65%. echocardiogram 01/2021 showing preserved ejection fraction, asymmetric septal hypertrophy up to 1.5 cm, no LVOT obstruction, mild aortic valve sclerosis.    Past Medical History:  Diagnosis Date   Ankle fracture 10/14/2017   Chronic heart failure with preserved ejection fraction (HFpEF) (HCC)    Heart murmur    Hypertension    LBBB (left bundle branch block)    LVH (left ventricular hypertrophy)    a. 06/2016 Echo: EF 60-65%, no rwma, mild MR. PASP ; b. 01/2021 Echo: EF 55-60%, sev assym septal hypertrophy (LV basal-mid septum up to 1.5cm). No SAM. No rwma. GrIDD. Nl RV fxn. Mild Ao sclerosis.   Palpitations    a. 07/2016 Holter: Avg HR 77 (54-117). 59 isolated PVCs. Rare PACs (<1%) w/ 3 atrial runs - longest 7 beats,  fastest 170 bpm x 4 beats - PAT.   PNA (pneumonia) 02/16/2018    Past Surgical History:  Procedure Laterality Date   APPENDECTOMY  1986   EYE SURGERY Bilateral 1995 and 1999   Lens implants   INTRAMEDULLARY (IM) NAIL INTERTROCHANTERIC Left 02/02/2022   Procedure: INTRAMEDULLARY (IM) NAIL INTERTROCHANTERIC;  Surgeon: Juanell Fairly, MD;  Location: [facility] ORS;  Service: Orthopedics;  Laterality: Left;   WRIST SURGERY Left 1986   ORIF     Current Medications: Current Meds  Medication Sig   ASPIRIN 81 PO Take 81 mg by mouth daily as needed.   cholecalciferol (VITAMIN D3) 25 MCG (1000 UNIT) tablet Take 1,000 Units by mouth daily.   feeding supplement (ENSURE ENLIVE / ENSURE PLUS) LIQD Take 237 mLs by mouth 2 (two) times daily between meals.   furosemide (LASIX) 20 MG tablet Take 1 tablet (20 mg total) by mouth daily.   linagliptin (TRADJENTA) 5 MG TABS tablet Take 1 tablet (5 mg total) by mouth daily.   lisinopril (ZESTRIL) 10 MG tablet Take 1 tablet (10 mg total) by mouth daily.   LISINOPRIL PO Take 10 mg by mouth daily.   metoprolol succinate (TOPROL XL) 25 MG 24 hr tablet Take 1 tablet (25 mg total) by mouth daily.   Multiple Vitamins-Calcium (ONE-A-DAY WOMENS PO) Take 1 tablet by mouth daily.     Allergies:   Augmentin [amoxicillin-pot clavulanate] and Prednisone   Social History   Socioeconomic History   Marital status:  Widowed    Spouse name: Not on file   Number of children: 5   Years of education: 1 year of college.   Highest education level: 12th grade  Occupational History   Not on file  Tobacco Use   Smoking status: Never   Smokeless tobacco: Never  Vaping Use   Vaping Use: Never used  Substance and Sexual Activity   Alcohol use: No    Alcohol/week: 0.0 standard drinks of alcohol   Drug use: No   Sexual activity: Not Currently  Other Topics Concern   Not on file  Social History Narrative   Lives in [place]    Owns a [employer]    1 dog   Social  Determinants of Health   Financial Resource Strain: Low Risk  (07/11/2021)   Overall Financial Resource Strain (CARDIA)    Difficulty of Paying Living Expenses: Not hard at all  Food Insecurity: No Food Insecurity (07/11/2021)   Hunger Vital Sign    Worried About Running Out of Food in the Last Year: Never true    Ran Out of Food in the Last Year: Never true  Transportation Needs: No Transportation Needs (07/11/2021)   PRAPARE - Hydrologist (Medical): No    Lack of Transportation (Non-Medical): No  Physical Activity: Unknown (07/02/2018)   Exercise Vital Sign    Days of Exercise per Week: 0 days    Minutes of Exercise per Session: Not on file  Stress: No Stress Concern Present (07/11/2021)   [place]    Feeling of Stress : Not at all  Social Connections: Unknown (07/11/2021)   Social Connection and Isolation Panel [NHANES]    Frequency of Communication with Friends and Family: More than three times a week    Frequency of Social Gatherings with Friends and Family: More than three times a week    Attends Religious Services: Not on file    Active Member of Clubs or Organizations: Not on file    Attends Archivist Meetings: Not on file    Marital Status: Widowed     Family History: The patient's family history includes COPD (age of onset: 27) in her father; Cancer (age of onset: 16) in her mother; Early death in her paternal grandmother; Heart disease in her maternal grandfather, maternal grandmother, and paternal grandfather; Heart disease (age of onset: 62) in her brother; Heart disease (age of onset: 5) in her father; Obesity in her paternal grandfather.  ROS:   Please see the history of present illness.     All other systems reviewed and are negative.  EKGs/Labs/Other Studies Reviewed:    The following studies were reviewed today:   EKG:  EKG is ordered today.  EKG shows  normal sinus rhythm, occasional PVCs.  Recent Labs: 02/04/2022: B Natriuretic Peptide 483.6 05/07/2022: ALT 10; BUN 13; Creatinine, Ser 0.64; Hemoglobin 14.1; Platelets 262.0; Potassium 3.9; Sodium 139; TSH 2.50  Recent Lipid Panel    Component Value Date/Time   CHOL 173 03/14/2020 0941   TRIG 117.0 03/14/2020 0941   HDL 36.20 (L) 03/14/2020 0941   CHOLHDL 5 03/14/2020 0941   VLDL 23.4 03/14/2020 0941   LDLCALC 114 (H) 03/14/2020 0941   LDLDIRECT 147.0 03/21/2016 0926     Risk Assessment/Calculations:          Physical Exam:    VS:  BP (!) 172/82 (BP Location: Left Arm, Patient Position: Sitting, Cuff Size:  Normal)   Pulse 90   Ht 5' 5.5" (1.664 m)   Wt 144 lb 9.6 oz (65.6 kg)   SpO2 96%   BMI 23.70 kg/m     Wt Readings from Last 3 Encounters:  05/27/22 144 lb 9.6 oz (65.6 kg)  05/07/22 144 lb 9.6 oz (65.6 kg)  02/01/22 143 lb 4.8 oz (65 kg)     GEN:  Well nourished, well developed in no acute distress HEENT: Normal NECK: No JVD; No carotid bruits CARDIAC: RRR, faint systolic murmur RESPIRATORY:  Clear to auscultation without rales, wheezing or rhonchi  ABDOMEN: Soft, non-tender, non-distended MUSCULOSKELETAL:  No edema; No deformity  SKIN: Warm and dry NEUROLOGIC:  Alert and oriented x 3 PSYCHIATRIC:  Normal affect   ASSESSMENT:    1. Heart failure with preserved ejection fraction, unspecified HF chronicity ([place])   2. White coat syndrome with diagnosis of hypertension   3. Paroxysmal atrial fibrillation (HCC)    PLAN:    In order of problems listed above:  HFpEF, asymmetric LVH, no plans for CMR.  Patient is euvolemic, denies shortness of breath.  Continue Lasix 20 mg daily. Hypertension, BP elevated, she has whitecoat syndrome.  BP normal at home. Continue lisinopril 10 mg daily. Paroxysmal atrial fibrillation, continue aspirin, start Toprol-XL 25 mg daily.  Not on anticoagulation due to frequent falls, developed anemia with anticoagulation in the  hospital.  Continue aspirin  Follow-up in 6 months.    Medication Adjustments/Labs and Tests Ordered: Current medicines are reviewed at length with the patient today.  Concerns regarding medicines are outlined above.  Orders Placed This Encounter  Procedures   EKG 12-Lead    Meds ordered this encounter  Medications   metoprolol succinate (TOPROL XL) 25 MG 24 hr tablet    Sig: Take 1 tablet (25 mg total) by mouth daily.    Dispense:  90 tablet    Refill:  0     Patient Instructions  Medication Instructions:   START Metoprolol Succinate - Take one tablet (25mg ) by mouth in the morning.   *If you need a refill on your cardiac medications before your next appointment, please call your pharmacy*   Follow-Up: At [facility], you and your health needs are our priority.  As part of our continuing mission to provide you with exceptional heart care, we have created designated Provider Care Teams.  These Care Teams include your primary Cardiologist (physician) and Advanced Practice Providers (APPs -  Physician Assistants and Nurse Practitioners) who all work together to provide you with the care you need, when you need it.  We recommend signing up for the patient portal called "MyChart".  Sign up information is provided on this After Visit Summary.  MyChart is used to connect with patients for Virtual Visits (Telemedicine).  Patients are able to view lab/test results, encounter notes, upcoming appointments, etc.  Non-urgent messages can be sent to your provider as well.   To learn more about what you can do with MyChart, go to [email or web address].    Your next appointment:   6 month(s)  The format for your next appointment:   In Person  Provider:   You may see Kate Sable, MD or one of the following Advanced Practice Providers on your designated Care Team:   Murray Hodgkins, NP Christell Faith, PA-C Cadence Kathlen Mody, PA-C Gerrie Nordmann, NP   Signed, Kate Sable, MD  05/27/2022 11:29 AM    [place]

## 2022-05-27 NOTE — Patient Instructions (Signed)
Medication Instructions:   START Metoprolol Succinate - Take one tablet (25mg ) by mouth in the morning.   *If you need a refill on your cardiac medications before your next appointment, please call your pharmacy*   Follow-Up: At [facility], you and your health needs are our priority.  As part of our continuing mission to provide you with exceptional heart care, we have created designated Provider Care Teams.  These Care Teams include your primary Cardiologist (physician) and Advanced Practice Providers (APPs -  Physician Assistants and Nurse Practitioners) who all work together to provide you with the care you need, when you need it.  We recommend signing up for the patient portal called "MyChart".  Sign up information is provided on this After Visit Summary.  MyChart is used to connect with patients for Virtual Visits (Telemedicine).  Patients are able to view lab/test results, encounter notes, upcoming appointments, etc.  Non-urgent messages can be sent to your provider as well.   To learn more about what you can do with MyChart, go to [facility].    Your next appointment:   6 month(s)  The format for your next appointment:   In Person  Provider:   You may see [email or web address], MD or one of the following Advanced Practice Providers on your designated Care Team:   Debbe Odea, NP Nicolasa Ducking, PA-C Cadence Eula Listen, PA-C Fransico Michael, NP

## 2022-06-10 ENCOUNTER — Ambulatory Visit: Payer: Medicare Other | Admitting: Podiatry

## 2022-06-10 VITALS — BP 167/87

## 2022-06-10 DIAGNOSIS — E119 Type 2 diabetes mellitus without complications: Secondary | ICD-10-CM | POA: Diagnosis not present

## 2022-06-10 DIAGNOSIS — M79676 Pain in unspecified toe(s): Secondary | ICD-10-CM | POA: Diagnosis not present

## 2022-06-10 DIAGNOSIS — B351 Tinea unguium: Secondary | ICD-10-CM | POA: Diagnosis not present

## 2022-06-10 NOTE — Progress Notes (Signed)
This patient returns to my office for at risk foot care.  This patient requires this care by a professional since this patient will be at risk due to having diabetes.   This patient is unable to cut nails herself since the patient cannot reach her nails.These nails are painful walking and wearing shoes.  This patient presents for at risk foot care today.  General Appearance  Alert, conversant and in no acute stress.  Vascular  Dorsalis pedis and posterior tibial  pulses are weakly  palpable  bilaterally.  Capillary return is within normal limits  bilaterally. Temperature is within normal limits  bilaterally.  Neurologic  Senn-Weinstein monofilament wire test within normal limits  bilaterally. Muscle power within normal limits bilaterally.  Nails Thick disfigured discolored nails with subungual debris  from hallux to fifth toenails left and 2-5 toenails right..  Absence of right hallux nail. No evidence of bacterial infection or drainage bilaterally.  Orthopedic  No limitations of motion  feet .  No crepitus or effusions noted.  No bony pathology or digital deformities noted.  Skin  normotropic skin with no porokeratosis noted bilaterally.  No signs of infections or ulcers noted.     Onychomycosis  Pain in right toes  Pain in left toes  Consent was obtained for treatment procedures.   Mechanical debridement of nails 1-5  Left and 2-5 right  performed with a nail nipper with the exception right hallux toenail.Ceasar Mons with dremel without incident.  Filed sharp nail right hallux nail bed.   Return office visit  10 weeks                    Told patient to return for periodic foot care and evaluation due to potential at risk complications.   Helane Gunther DPM

## 2022-06-11 DIAGNOSIS — H35373 Puckering of macula, bilateral: Secondary | ICD-10-CM | POA: Diagnosis not present

## 2022-06-11 LAB — HM DIABETES EYE EXAM

## 2022-07-01 ENCOUNTER — Telehealth: Payer: Self-pay | Admitting: Cardiology

## 2022-07-01 MED ORDER — METOPROLOL SUCCINATE ER 25 MG PO TB24: 12.5000 mg | ORAL_TABLET | Freq: Every day | ORAL | 0 refills | Status: DC

## 2022-07-01 NOTE — Telephone Encounter (Signed)
Returned the call to the patient. She stated that she feels like the Metoprolol Succinate is giving her insomnia. This started a few days after starting the Metoprolol.  She stated that this happened before when she was in rehab and was started on Tartrate.  She wants to know if she can take half the dose to see if this helps.

## 2022-07-01 NOTE — Telephone Encounter (Signed)
Pt c/o medication issue:  1. Name of Medication:   metoprolol tartrate (LOPRESSOR) 50 MG tablet   2. How are you currently taking this medication (dosage and times per day)?   As prescribed  3. Are you having a reaction (difficulty breathing--STAT)?  No  4. What is your medication issue?   Patient called to report that the medication was fine for the first couple of weeks but now the patient stated she is now having insomnia. Patient stated she would be willing to take a lower dose so she can get some sleep.

## 2022-07-01 NOTE — Addendum Note (Signed)
Addended by: Janan Ridge on: 07/01/2022 01:57 PM   Modules accepted: Orders

## 2022-07-01 NOTE — Telephone Encounter (Signed)
Patient returned call.  Patient is agreeable to plan.

## 2022-07-01 NOTE — Telephone Encounter (Signed)
Attempted to call patient to review Dr. Garen Lah recommendations.  No answer. NO VM.

## 2022-07-08 ENCOUNTER — Ambulatory Visit (INDEPENDENT_AMBULATORY_CARE_PROVIDER_SITE_OTHER): Payer: Medicare Other | Admitting: Family Medicine

## 2022-07-08 ENCOUNTER — Encounter: Payer: Self-pay | Admitting: Family Medicine

## 2022-07-08 ENCOUNTER — Telehealth: Payer: Self-pay | Admitting: Family Medicine

## 2022-07-08 VITALS — BP 130/70 | HR 87 | Temp 98.4°F | Ht 65.0 in | Wt 144.2 lb

## 2022-07-08 DIAGNOSIS — I10 Essential (primary) hypertension: Secondary | ICD-10-CM

## 2022-07-08 DIAGNOSIS — I480 Paroxysmal atrial fibrillation: Secondary | ICD-10-CM | POA: Diagnosis not present

## 2022-07-08 DIAGNOSIS — G8929 Other chronic pain: Secondary | ICD-10-CM

## 2022-07-08 DIAGNOSIS — M25561 Pain in right knee: Secondary | ICD-10-CM | POA: Diagnosis not present

## 2022-07-08 DIAGNOSIS — E119 Type 2 diabetes mellitus without complications: Secondary | ICD-10-CM

## 2022-07-08 MED ORDER — LISINOPRIL 10 MG PO TABS: 10.0000 mg | ORAL_TABLET | Freq: Every day | ORAL | 1 refills | Status: DC

## 2022-07-08 MED ORDER — FUROSEMIDE 20 MG PO TABS: 20.0000 mg | ORAL_TABLET | Freq: Every day | ORAL | 1 refills | Status: DC | PRN

## 2022-07-08 NOTE — Assessment & Plan Note (Signed)
She will contact the pharmaceutical company to check on her [place].  When she receives this she will resume Tradjenta 5 mg daily.

## 2022-07-08 NOTE — Patient Instructions (Signed)
Nice to see you. Please continue on the aspirin 81 mg daily.  You are taking this because of the atrial fibrillation issues that you had previously.  This will help reduce your risk of stroke. Please take your Lasix only once daily as needed for swelling or shortness of breath.

## 2022-07-08 NOTE — Telephone Encounter (Signed)
Pt called stating the pills are on their way

## 2022-07-08 NOTE — Assessment & Plan Note (Signed)
Chronic issue.  Sinus rhythm today.  She will continue metoprolol 12.5 mg daily and aspirin 81 mg daily.

## 2022-07-08 NOTE — Assessment & Plan Note (Addendum)
Chronic issue.  Adequately controlled.  She will continue lisinopril 10 mg once daily and metoprolol 12.5 mg daily.  Discussed we could have her use the Lasix 20 mg daily as needed for swelling or shortness of breath.  We also discussed having her discontinue her aspirin though upon further review she is also on this to help with A-fib and she was recommended to continue on aspirin 81 mg daily.

## 2022-07-08 NOTE — Progress Notes (Signed)
Tommi Rumps, MD Phone: [phone]  Laurie Patel is a 89 y.o. female who presents today for f/u.  HYPERTENSION Disease Monitoring Home BP Monitoring 130/50-70, yesterday systolic got to 213 Chest pain- no    Dyspnea- no Medications Compliance-  taking lasix, lisinopril, metoprolol. Wants to come off some medications.  Edema- no BMET    Component Value Date/Time   NA 139 05/07/2022 0905   NA 142 09/11/2021 1040   K 3.9 05/07/2022 0905   CL 97 05/07/2022 0905   CO2 33 (H) 05/07/2022 0905   GLUCOSE 238 (H) 05/07/2022 0905   BUN 13 05/07/2022 0905   BUN 18 09/11/2021 1040   CREATININE 0.64 05/07/2022 0905   CALCIUM 9.9 05/07/2022 0905   GFRNONAA >60 02/08/2022 0415   DIABETES Disease Monitoring: Blood Sugar ranges-70-125 Polyuria/phagia/dipsia- no      Optho- UTD Medications: Compliance- not taking medication, has not received tradjenta from pharmaceutical company Hypoglycemic symptoms- no  Right knee pain: Patient notes this hurts at times when she is standing and feels as though it is going to give out.  She is thinking about going back to see orthopedics to see if they have a better brace for her to wear.  She will try and hold off on that for as long as possible.  She does note this has been going on for some time now.   Social History   Tobacco Use  Smoking Status Never  Smokeless Tobacco Never    Current Outpatient Medications on File Prior to Visit  Medication Sig Dispense Refill   ASPIRIN 81 PO Take 81 mg by mouth daily as needed.     cholecalciferol (VITAMIN D3) 25 MCG (1000 UNIT) tablet Take 1,000 Units by mouth daily.     feeding supplement (ENSURE ENLIVE / ENSURE PLUS) LIQD Take 237 mLs by mouth 2 (two) times daily between meals. 237 mL 12   linagliptin (TRADJENTA) 5 MG TABS tablet Take 1 tablet (5 mg total) by mouth daily.     metoprolol succinate (TOPROL XL) 25 MG 24 hr tablet Take 0.5 tablets (12.5 mg total) by mouth daily. 45 tablet 0   Multiple  Vitamins-Calcium (ONE-A-DAY WOMENS PO) Take 1 tablet by mouth daily.     No current facility-administered medications on file prior to visit.     ROS see history of present illness  Objective  Physical Exam Vitals:   07/08/22 1029  BP: 130/70  Pulse: 87  Temp: 98.4 F (36.9 C)  SpO2: 94%    BP Readings from Last 3 Encounters:  07/08/22 130/70  06/10/22 (!) 167/87  05/27/22 (!) 172/82   Wt Readings from Last 3 Encounters:  07/08/22 144 lb 3.2 oz (65.4 kg)  05/27/22 144 lb 9.6 oz (65.6 kg)  05/07/22 144 lb 9.6 oz (65.6 kg)    Physical Exam Constitutional:      General: She is not in acute distress.    Appearance: She is not diaphoretic.  Cardiovascular:     Rate and Rhythm: Normal rate and regular rhythm.     Heart sounds: Normal heart sounds.  Pulmonary:     Effort: Pulmonary effort is normal.     Breath sounds: Normal breath sounds.  Musculoskeletal:     Comments: Right knee with no apparent swelling, warmth, erythema, or tenderness  Skin:    General: Skin is warm and dry.  Neurological:     Mental Status: She is alert.      Assessment/Plan: Please see individual problem list.  Paroxysmal atrial fibrillation (HCC) Assessment & Plan: Chronic issue.  Sinus rhythm today.  She will continue metoprolol 12.5 mg daily and aspirin 81 mg daily.   Primary hypertension Assessment & Plan: Chronic issue.  Adequately controlled.  She will continue lisinopril 10 mg once daily and metoprolol 12.5 mg daily.  Discussed we could have her use the Lasix 20 mg daily as needed for swelling or shortness of breath.  We also discussed having her discontinue her aspirin though upon further review she is also on this to help with A-fib and she was recommended to continue on aspirin 81 mg daily.  Orders: -     Lisinopril; Take 1 tablet (10 mg total) by mouth daily.  Dispense: 90 tablet; Refill: 1 -     Furosemide; Take 1 tablet (20 mg total) by mouth daily as needed for edema.   Dispense: 30 tablet; Refill: 1  Controlled type 2 diabetes mellitus without complication, without long-term current use of insulin (Speculator) Assessment & Plan: She will contact the pharmaceutical company to check on her [place].  When she receives this she will resume Tradjenta 5 mg daily.   Chronic pain of right knee Assessment & Plan: I encouraged the patient to follow-up with her orthopedist when she is ready.      Return in about 3 months (around 10/07/2022) for Diabetes.   Tommi Rumps, MD [place]

## 2022-07-08 NOTE — Assessment & Plan Note (Signed)
I encouraged the patient to follow-up with her orthopedist when she is ready.

## 2022-07-19 ENCOUNTER — Ambulatory Visit: Payer: Medicare Other | Admitting: Family Medicine

## 2022-08-19 ENCOUNTER — Ambulatory Visit (INDEPENDENT_AMBULATORY_CARE_PROVIDER_SITE_OTHER): Payer: Medicare Other

## 2022-08-19 VITALS — Ht 65.0 in | Wt 144.0 lb

## 2022-08-19 DIAGNOSIS — Z0000 Encounter for general adult medical examination without abnormal findings: Secondary | ICD-10-CM | POA: Diagnosis not present

## 2022-08-19 NOTE — Progress Notes (Signed)
Subjective:   Laurie Patel is a 89 y.o. female who presents for Medicare Annual (Subsequent) preventive examination.  Review of Systems    No ROS.  Medicare Wellness Virtual Visit.  Visual/audio telehealth visit, UTA vital signs.   See social history for additional risk factors.   Cardiac Risk Factors include: advanced age (>13mn, >>87women)     Objective:    Today's Vitals   08/19/22 1054  Weight: 144 lb (65.3 kg)  Height: '5\' 5"'$  (1.651 m)   Body mass index is 23.96 kg/m.     08/19/2022   11:15 AM 02/02/2022    8:54 AM 02/01/2022    6:21 PM 09/08/2021    9:29 AM 07/11/2021   10:14 AM 07/10/2021    7:34 PM 01/30/2021   11:25 AM  Advanced Directives  Does Patient Have a Medical Advance Directive? Yes No No No Yes Yes No  Type of AParamedicof [place] will    Living will Living will   Does patient want to make changes to medical advance directive? No - Patient declined        Copy of [place]in Chart? No - copy requested        Would patient like information on creating a medical advance directive?  No - Patient declined         Current Medications (verified) Outpatient Encounter Medications as of 08/19/2022  Medication Sig   ASPIRIN 81 PO Take 81 mg by mouth daily as needed.   cholecalciferol (VITAMIN D3) 25 MCG (1000 UNIT) tablet Take 1,000 Units by mouth daily.   feeding supplement (ENSURE ENLIVE / ENSURE PLUS) LIQD Take 237 mLs by mouth 2 (two) times daily between meals.   furosemide (LASIX) 20 MG tablet Take 1 tablet (20 mg total) by mouth daily as needed for edema.   linagliptin (TRADJENTA) 5 MG TABS tablet Take 1 tablet (5 mg total) by mouth daily.   lisinopril (ZESTRIL) 10 MG tablet Take 1 tablet (10 mg total) by mouth daily.   metoprolol succinate (TOPROL XL) 25 MG 24 hr tablet Take 0.5 tablets (12.5 mg total) by mouth daily.   Multiple Vitamins-Calcium (ONE-A-DAY WOMENS PO) Take 1 tablet by mouth daily.   No  facility-administered encounter medications on file as of 08/19/2022.    Allergies (verified) Augmentin [amoxicillin-pot clavulanate] and Prednisone   History: Past Medical History:  Diagnosis Date   Ankle fracture 10/14/2017   Chronic heart failure with preserved ejection fraction (HFpEF) (HCC)    Heart murmur    Hypertension    LBBB (left bundle branch block)    LVH (left ventricular hypertrophy)    a. 06/2016 Echo: EF 60-65%, no rwma, mild MR. PASP 340mg; b. 01/2021 Echo: EF 55-60%, sev assym septal hypertrophy (LV basal-mid septum up to 1.5cm). No SAM. No rwma. GrIDD. Nl RV fxn. Mild Ao sclerosis.   Palpitations    a. 07/2016 Holter: Avg HR 77 (54-117). 5923solated PVCs. Rare PACs (<1%) w/ 3 atrial runs - longest 7 beats, fastest 170 bpm x 4 beats - PAT.   PNA (pneumonia) 02/16/2018   Past Surgical History:  Procedure Laterality Date   APPENDECTOMY  1986   EYE SURGERY Bilateral 1995 and 1999   Lens implants   INTRAMEDULLARY (IM) NAIL INTERTROCHANTERIC Left 02/02/2022   Procedure: INTRAMEDULLARY (IM) NAIL INTERTROCHANTERIC;  Surgeon: KrThornton ParkMD;  Location: [facility] ORS;  Service: Orthopedics;  Laterality: Left;   WRIST SURGERY Left 1986  ORIF    Family History  Problem Relation Age of Onset   Cancer Mother 74       Breast Cancer   Heart disease Father 35       Congestive Heart Failure   COPD Father 49       Emphysema   Heart disease Brother 67       massive heart attack   Heart disease Maternal Grandmother    Heart disease Maternal Grandfather    Early death Paternal Grandmother        Early death unsure age   Heart disease Paternal Grandfather    Obesity Paternal Grandfather    Social History   Socioeconomic History   Marital status: Widowed    Spouse name: Not on file   Number of children: 5   Years of education: 1 year of college.   Highest education level: 12th grade  Occupational History   Not on file  Tobacco Use   Smoking status: Never    Smokeless tobacco: Never  Vaping Use   Vaping Use: Never used  Substance and Sexual Activity   Alcohol use: No    Alcohol/week: 0.0 standard drinks of alcohol   Drug use: No   Sexual activity: Not Currently  Other Topics Concern   Not on file  Social History Narrative   Lives in [place]    Owns a [employer]    1 dog   Social Determinants of Health   Financial Resource Strain: Low Risk  (08/19/2022)   Overall Financial Resource Strain (CARDIA)    Difficulty of Paying Living Expenses: Not hard at all  Food Insecurity: No Food Insecurity (08/19/2022)   Hunger Vital Sign    Worried About Running Out of Food in the Last Year: Never true    Ran Out of Food in the Last Year: Never true  Transportation Needs: No Transportation Needs (08/19/2022)   PRAPARE - Hydrologist (Medical): No    Lack of Transportation (Non-Medical): No  Physical Activity: Unknown (07/02/2018)   Exercise Vital Sign    Days of Exercise per Week: 0 days    Minutes of Exercise per Session: Not on file  Stress: No Stress Concern Present (08/19/2022)   [place]    Feeling of Stress : Not at all  Social Connections: Unknown (08/19/2022)   Social Connection and Isolation Panel [NHANES]    Frequency of Communication with Friends and Family: More than three times a week    Frequency of Social Gatherings with Friends and Family: More than three times a week    Attends Religious Services: Not on file    Active Member of Clubs or Organizations: Not on file    Attends Archivist Meetings: Not on file    Marital Status: Widowed    Tobacco Counseling Counseling given: Not Answered   Clinical Intake:  Pre-visit preparation completed: Yes        Diabetes: No  How often do you need to have someone help you when you read instructions, pamphlets, or other written materials from your doctor or pharmacy?: 1 -  Never    Interpreter Needed?: No      Activities of Daily Living    08/19/2022   11:07 AM 02/02/2022    8:54 AM  In your present state of health, do you have any difficulty performing the following activities:  Hearing? 1 0  Comment Hearing aids  Vision? 0 0  Difficulty concentrating or making decisions? 0 0  Walking or climbing stairs? 1 0  Dressing or bathing? 0 0  Doing errands, shopping? 1 0  Comment Daughter Diplomatic Services operational officer and eating ? N   Using the Toilet? N   In the past six months, have you accidently leaked urine? N   Do you have problems with loss of bowel control? N   Managing your Medications? N   Managing your Finances? N   Housekeeping or managing your Housekeeping? N     Patient Care Team: Leone Haven, MD as PCP - General (Family Medicine) Kate Sable, MD as PCP - Cardiology (Cardiology)  Indicate any recent Medical Services you may have received from other than [facility] providers in the past year (date may be approximate).     Assessment:   This is a routine wellness examination for Laurie Patel.  I connected with  Laurie Patel on 08/19/22 by a audio enabled telemedicine application and verified that I am speaking with the correct person using two identifiers.  Patient Location: Home  Provider Location: Office/Clinic  I discussed the limitations of evaluation and management by telemedicine. The patient expressed understanding and agreed to proceed.   Hearing/Vision screen Hearing Screening - Comments:: Patient does wear hearing aids.  Vision Screening - Comments:: Followed [facility] Wears corrective lenses Cataract extraction, bilateral They have regular follow up with the ophthalmologist    Dietary issues and exercise activities discussed:    Heart healthy diet Good water intake   Goals Addressed             This Visit's Progress    Follow up with Primary Care Provider       As needed.        Depression Screen    08/19/2022   11:07 AM 07/08/2022   10:30 AM 05/07/2022    8:38 AM 01/15/2022    8:53 AM 07/11/2021   10:03 AM 07/20/2020    8:36 AM 07/10/2020   10:01 AM  PHQ 2/9 Scores  PHQ - 2 Score 0 0 0 0 0 0 0    Fall Risk    08/19/2022   11:07 AM 07/08/2022   10:30 AM 05/07/2022    8:38 AM 01/15/2022    8:53 AM 07/11/2021   10:17 AM  Fall Risk   Falls in the past year? 0 0 1 0 0  Number falls in past yr: 0 0 1 0 0  Injury with Fall? 0 0 1 0   Risk for fall due to :  No Fall Risks History of fall(s) No Fall Risks   Follow up Falls evaluation completed;Falls prevention discussed Falls evaluation completed Falls evaluation completed Falls evaluation completed Falls evaluation completed    FALL RISK PREVENTION PERTAINING TO THE HOME: Home free of loose throw rugs in walkways, pet beds, electrical cords, etc? Yes  Adequate lighting in your home to reduce risk of falls? Yes   ASSISTIVE DEVICES UTILIZED TO PREVENT FALLS: Life alert? No  Use of a cane, walker or w/c? Yes  Grab bars in the bathroom? Yes  Shower chair or bench in shower? Yes  Elevated toilet seat or a handicapped toilet? Yes   TIMED UP AND GO: Was the test performed? No .   Cognitive Function:    06/26/2017    9:37 AM  MMSE - Mini Mental State Exam  Orientation to time 5  Orientation to Place 5  Registration 3  Attention/ Calculation 5  Recall 3  Language- name 2 objects 2  Language- repeat 1  Language- follow 3 step command 3  Language- read & follow direction 1  Write a sentence 1  Copy design 1  Total score 30        08/19/2022   11:11 AM 07/11/2021   10:21 AM 07/08/2019   10:02 AM 07/02/2018    9:35 AM  6CIT Screen  What Year? 0 points 0 points 0 points 0 points  What month? 0 points 0 points 0 points 0 points  What time? 0 points 0 points 0 points 0 points  Count back from 20 0 points  0 points 0 points  Months in reverse 0 points  0 points 0 points  Repeat phrase 0 points  0 points  0 points  Total Score 0 points  0 points 0 points    Immunizations Immunization History  Administered Date(s) Administered   Fluad Quad(high Dose 65+) 03/22/2022   Influenza Nasal 02/20/2021   Influenza Split 02/24/2013, 03/24/2014, 02/29/2016   Influenza, High Dose Seasonal PF 04/10/2017, 02/14/2018, 02/10/2019, 02/22/2021   Influenza-Unspecified 04/07/2017, 02/10/2019, 03/07/2020   PFIZER(Purple Top)SARS-COV-2 Vaccination 07/30/2019, 08/20/2019, 05/23/2020, 10/03/2020   Pfizer Covid-19 Vaccine Bivalent Booster 98yr & up 03/22/2022   Pneumococcal Conjugate-13 02/10/2019   Pneumococcal Polysaccharide-23 02/10/2019   Pneumococcal-Unspecified 03/24/2014   Rsv, Bivalent, Protein Subunit Rsvpref,pf ([place] 03/26/2022   Td 02/08/2010   Tdap 06/24/2008   TDAP status: Due, Education has been provided regarding the importance of this vaccine. Advised may receive this vaccine at local pharmacy or Health Dept. Aware to provide a copy of the vaccination record if obtained from local pharmacy or Health Dept. Verbalized acceptance and understanding.  Shingrix Completed?: No.    Education has been provided regarding the importance of this vaccine. Patient has been advised to call insurance company to determine out of pocket expense if they have not yet received this vaccine. Advised may also receive vaccine at local pharmacy or Health Dept. Verbalized acceptance and understanding.  Screening Tests Health Maintenance  Topic Date Due   DTaP/Tdap/Td (3 - Td or Tdap) 02/09/2020   COVID-19 Vaccine (6 - 2023-24 season) 09/04/2022 (Originally 05/17/2022)   Zoster Vaccines- Shingrix (1 of 2) 11/17/2022 (Originally 08/31/1946)   HEMOGLOBIN A1C  11/05/2022   FOOT EXAM  03/22/2023   OPHTHALMOLOGY EXAM  06/12/2023   Medicare Annual Wellness (AWV)  08/20/2023   Pneumonia Vaccine 89 Years old  Completed   INFLUENZA VACCINE  Completed   DEXA SCAN  Completed   HPV VACCINES  Aged Out    Health  Maintenance Health Maintenance Due  Topic Date Due   DTaP/Tdap/Td (3 - Td or Tdap) 02/09/2020   Lung Cancer Screening: (Low Dose CT Chest recommended if Age 87-80years, 30 pack-year currently smoking OR have quit w/in 15years.) does not qualify.   Hepatitis C Screening: does not qualify.  Vision Screening: Recommended annual ophthalmology exams for early detection of glaucoma and other disorders of the eye.  Dental Screening: Recommended annual dental exams for proper oral hygiene  Community Resource Referral / Chronic Care Management: CRR required this visit?  No   CCM required this visit?  No      Plan:     I have personally reviewed and noted the following in the patient's chart:   Medical and social history Use of alcohol, tobacco or illicit drugs  Current medications and supplements including opioid prescriptions. Patient is not currently  taking opioid prescriptions. Functional ability and status Nutritional status Physical activity Advanced directives List of other physicians Hospitalizations, surgeries, and ER visits in previous 12 months Vitals Screenings to include cognitive, depression, and falls Referrals and appointments  In addition, I have reviewed and discussed with patient certain preventive protocols, quality metrics, and best practice recommendations. A written personalized care plan for preventive services as well as general preventive health recommendations were provided to patient.     Leta Jungling, LPN   [id number]

## 2022-08-19 NOTE — Patient Instructions (Addendum)
Laurie Patel , Thank you for taking time to come for your Medicare Wellness Visit. I appreciate your ongoing commitment to your health goals. Please review the following plan we discussed and let me know if I can assist you in the future.   These are the goals we discussed:  Goals      Follow up with Primary Care Provider     As needed.        This is a list of the screening recommended for you and due dates:  Health Maintenance  Topic Date Due   DTaP/Tdap/Td vaccine (3 - Td or Tdap) 02/09/2020   COVID-19 Vaccine (6 - 2023-24 season) 09/04/2022*   Zoster (Shingles) Vaccine (1 of 2) 11/17/2022*   Hemoglobin A1C  11/05/2022   Complete foot exam   03/22/2023   Eye exam for diabetics  06/12/2023   Medicare Annual Wellness Visit  08/20/2023   Pneumonia Vaccine  Completed   Flu Shot  Completed   DEXA scan (bone density measurement)  Completed   HPV Vaccine  Aged Out  *Topic was postponed. The date shown is not the original due date.    Advanced directives: End of life planning; Advance aging; Advanced directives discussed.  Copy of current HCPOA/Living Will requested.    Conditions/risks identified: none new  Next appointment: Follow up in one year for your annual wellness visit    Preventive Care 65 Years and Older, Female Preventive care refers to lifestyle choices and visits with your health care provider that can promote health and wellness. What does preventive care include? A yearly physical exam. This is also called an annual well check. Dental exams once or twice a year. Routine eye exams. Ask your health care provider how often you should have your Sherrod checked. Personal lifestyle choices, including: Daily care of your teeth and gums. Regular physical activity. Eating a healthy diet. Avoiding tobacco and drug use. Limiting alcohol use. Practicing safe sex. Taking low-dose aspirin every day. Taking vitamin and mineral supplements as recommended by your health care  provider. What happens during an annual well check? The services and screenings done by your health care provider during your annual well check will depend on your age, overall health, lifestyle risk factors, and family history of disease. Counseling  Your health care provider may ask you questions about your: Alcohol use. Tobacco use. Drug use. Emotional well-being. Home and relationship well-being. Sexual activity. Eating habits. History of falls. Memory and ability to understand (cognition). Work and work Statistician. Reproductive health. Screening  You may have the following tests or measurements: Height, weight, and BMI. Blood pressure. Lipid and cholesterol levels. These may be checked every 5 years, or more frequently if you are over 78 years old. Skin check. Lung cancer screening. You may have this screening every year starting at age 35 if you have a 30-pack-year history of smoking and currently smoke or have quit within the past 15 years. Fecal occult blood test (FOBT) of the stool. You may have this test every year starting at age 14. Flexible sigmoidoscopy or colonoscopy. You may have a sigmoidoscopy every 5 years or a colonoscopy every 10 years starting at age 33. Hepatitis C blood test. Hepatitis B blood test. Sexually transmitted disease (STD) testing. Diabetes screening. This is done by checking your blood sugar (glucose) after you have not eaten for a while (fasting). You may have this done every 1-3 years. Bone density scan. This is done to screen for osteoporosis. You may have  this done starting at age 58. Mammogram. This may be done every 1-2 years. Talk to your health care provider about how often you should have regular mammograms. Talk with your health care provider about your test results, treatment options, and if necessary, the need for more tests. Vaccines  Your health care provider may recommend certain vaccines, such as: Influenza vaccine. This is  recommended every year. Tetanus, diphtheria, and acellular pertussis (Tdap, Td) vaccine. You may need a Td booster every 10 years. Zoster vaccine. You may need this after age 61. Pneumococcal 13-valent conjugate (PCV13) vaccine. One dose is recommended after age 23. Pneumococcal polysaccharide (PPSV23) vaccine. One dose is recommended after age 51. Talk to your health care provider about which screenings and vaccines you need and how often you need them. This information is not intended to replace advice given to you by your health care provider. Make sure you discuss any questions you have with your health care provider. Document Released: 07/07/2015 Document Revised: 02/28/2016 Document Reviewed: 04/11/2015 Elsevier Interactive Patient Education  2017 [place] Prevention in the Home Falls can cause injuries. They can happen to people of all ages. There are many things you can do to make your home safe and to help prevent falls. What can I do on the outside of my home? Regularly fix the edges of walkways and driveways and fix any cracks. Remove anything that might make you trip as you walk through a door, such as a raised step or threshold. Trim any bushes or trees on the path to your home. Use bright outdoor lighting. Clear any walking paths of anything that might make someone trip, such as rocks or tools. Regularly check to see if handrails are loose or broken. Make sure that both sides of any steps have handrails. Any raised decks and porches should have guardrails on the edges. Have any leaves, snow, or ice cleared regularly. Use sand or salt on walking paths during winter. Clean up any spills in your garage right away. This includes oil or grease spills. What can I do in the bathroom? Use night lights. Install grab bars by the toilet and in the tub and shower. Do not use towel bars as grab bars. Use non-skid mats or decals in the tub or shower. If you need to sit down in  the shower, use a plastic, non-slip stool. Keep the floor dry. Clean up any water that spills on the floor as soon as it happens. Remove soap buildup in the tub or shower regularly. Attach bath mats securely with double-sided non-slip rug tape. Do not have throw rugs and other things on the floor that can make you trip. What can I do in the bedroom? Use night lights. Make sure that you have a light by your bed that is easy to reach. Do not use any sheets or blankets that are too big for your bed. They should not hang down onto the floor. Have a firm chair that has side arms. You can use this for support while you get dressed. Do not have throw rugs and other things on the floor that can make you trip. What can I do in the kitchen? Clean up any spills right away. Avoid walking on wet floors. Keep items that you use a lot in easy-to-reach places. If you need to reach something above you, use a strong step stool that has a grab bar. Keep electrical cords out of the way. Do not use floor polish or  wax that makes floors slippery. If you must use wax, use non-skid floor wax. Do not have throw rugs and other things on the floor that can make you trip. What can I do with my stairs? Do not leave any items on the stairs. Make sure that there are handrails on both sides of the stairs and use them. Fix handrails that are broken or loose. Make sure that handrails are as long as the stairways. Check any carpeting to make sure that it is firmly attached to the stairs. Fix any carpet that is loose or worn. Avoid having throw rugs at the top or bottom of the stairs. If you do have throw rugs, attach them to the floor with carpet tape. Make sure that you have a light switch at the top of the stairs and the bottom of the stairs. If you do not have them, ask someone to add them for you. What else can I do to help prevent falls? Wear shoes that: Do not have high heels. Have rubber bottoms. Are comfortable  and fit you well. Are closed at the toe. Do not wear sandals. If you use a stepladder: Make sure that it is fully opened. Do not climb a closed stepladder. Make sure that both sides of the stepladder are locked into place. Ask someone to hold it for you, if possible. Clearly mark and make sure that you can see: Any grab bars or handrails. First and last steps. Where the edge of each step is. Use tools that help you move around (mobility aids) if they are needed. These include: Canes. Walkers. Scooters. Crutches. Turn on the lights when you go into a dark area. Replace any light bulbs as soon as they burn out. Set up your furniture so you have a clear path. Avoid moving your furniture around. If any of your floors are uneven, fix them. If there are any pets around you, be aware of where they are. Review your medicines with your doctor. Some medicines can make you feel dizzy. This can increase your chance of falling. Ask your doctor what other things that you can do to help prevent falls. This information is not intended to replace advice given to you by your health care provider. Make sure you discuss any questions you have with your health care provider. Document Released: 04/06/2009 Document Revised: 11/16/2015 Document Reviewed: 07/15/2014 Elsevier Interactive Patient Education  2017 [employer].

## 2022-08-22 ENCOUNTER — Encounter: Payer: Self-pay | Admitting: Podiatry

## 2022-08-22 ENCOUNTER — Ambulatory Visit: Payer: Medicare Other | Admitting: Podiatry

## 2022-08-22 ENCOUNTER — Other Ambulatory Visit: Payer: Self-pay

## 2022-08-22 VITALS — BP 165/66 | HR 80

## 2022-08-22 DIAGNOSIS — M79676 Pain in unspecified toe(s): Secondary | ICD-10-CM

## 2022-08-22 DIAGNOSIS — B351 Tinea unguium: Secondary | ICD-10-CM

## 2022-08-22 DIAGNOSIS — E119 Type 2 diabetes mellitus without complications: Secondary | ICD-10-CM

## 2022-08-22 MED ORDER — METOPROLOL SUCCINATE ER 25 MG PO TB24: 12.5000 mg | ORAL_TABLET | Freq: Every day | ORAL | 0 refills | Status: DC

## 2022-08-22 NOTE — Progress Notes (Signed)
This patient returns to my office for at risk foot care.  This patient requires this care by a professional since this patient will be at risk due to having diabetes.   This patient is unable to cut nails herself since the patient cannot reach her nails.These nails are painful walking and wearing shoes.  This patient presents for at risk foot care today.  General Appearance  Alert, conversant and in no acute stress.  Vascular  Dorsalis pedis and posterior tibial  pulses are weakly  palpable  bilaterally.  Capillary return is within normal limits  bilaterally. Temperature is within normal limits  bilaterally.  Neurologic  Senn-Weinstein monofilament wire test within normal limits  bilaterally. Muscle power within normal limits bilaterally.  Nails Thick disfigured discolored nails with subungual debris  from hallux to fifth toenails left and 2-5 toenails right..  Absence of right hallux nail. No evidence of bacterial infection or drainage bilaterally.  Orthopedic  No limitations of motion  feet .  No crepitus or effusions noted.  No bony pathology or digital deformities noted.  Skin  normotropic skin with no porokeratosis noted bilaterally.  No signs of infections or ulcers noted.     Onychomycosis  Pain in right toes  Pain in left toes  Consent was obtained for treatment procedures.   Mechanical debridement of nails 1-5  Left and 2-5 right  performed with a nail nipper with the exception right hallux toenail.Danley Danker with dremel without incident.     Return office visit  10 weeks                    Told patient to return for periodic foot care and evaluation due to potential at risk complications.   Gardiner Barefoot DPM

## 2022-08-23 ENCOUNTER — Telehealth: Payer: Self-pay | Admitting: Cardiology

## 2022-08-23 NOTE — Telephone Encounter (Signed)
.  Pt c/o medication issue:  1. Name of Medication:   metoprolol succinate (TOPROL XL) 25 MG 24 hr tablet    2. How are you currently taking this medication (dosage and times per day)? Take 0.5 tablets (12.5 mg total) by mouth daily.   3. Are you having a reaction (difficulty breathing--STAT)? No   4. What is your medication issue? Pt said, she wanted to know if she needs to continue taking this medication. She said, she though this is something to try, she is taking half a tablet a day and she still have a lot of medication and a new refill was sent yesterday

## 2022-08-23 NOTE — Telephone Encounter (Signed)
Spoke with patient and reviewed her chart. Back on 1/8 her toprol was decreased to half tablet (12.5 mg) due to issues with her sleep. She reports that is better and would like to just stay on the ordered dose for now. She was appreciative for the call back and had no further questions.

## 2022-09-26 DIAGNOSIS — M542 Cervicalgia: Secondary | ICD-10-CM | POA: Diagnosis not present

## 2022-10-01 DIAGNOSIS — M542 Cervicalgia: Secondary | ICD-10-CM | POA: Diagnosis not present

## 2022-10-09 ENCOUNTER — Encounter: Payer: Self-pay | Admitting: Family Medicine

## 2022-10-09 ENCOUNTER — Ambulatory Visit (INDEPENDENT_AMBULATORY_CARE_PROVIDER_SITE_OTHER): Payer: Medicare Other | Admitting: Family Medicine

## 2022-10-09 VITALS — BP 114/68 | HR 91 | Temp 97.8°F | Ht 65.0 in | Wt 146.0 lb

## 2022-10-09 DIAGNOSIS — I10 Essential (primary) hypertension: Secondary | ICD-10-CM | POA: Diagnosis not present

## 2022-10-09 DIAGNOSIS — E119 Type 2 diabetes mellitus without complications: Secondary | ICD-10-CM

## 2022-10-09 DIAGNOSIS — G5601 Carpal tunnel syndrome, right upper limb: Secondary | ICD-10-CM

## 2022-10-09 DIAGNOSIS — G5600 Carpal tunnel syndrome, unspecified upper limb: Secondary | ICD-10-CM | POA: Insufficient documentation

## 2022-10-09 LAB — BASIC METABOLIC PANEL
BUN: 17 mg/dL (ref 6–23)
CO2: 29 mEq/L (ref 19–32)
Calcium: 9.5 mg/dL (ref 8.4–10.5)
Chloride: 99 mEq/L (ref 96–112)
Creatinine, Ser: 0.73 mg/dL (ref 0.40–1.20)
GFR: 71.05 mL/min (ref >60.00)
Glucose, Bld: 207 mg/dL — ABNORMAL HIGH (ref 70–99)
Potassium: 4.2 mEq/L (ref 3.5–5.1)
Sodium: 137 mEq/L (ref 135–145)

## 2022-10-09 LAB — HEMOGLOBIN A1C: Hgb A1c MFr Bld: 7.5 % — ABNORMAL HIGH (ref 4.6–6.5)

## 2022-10-09 MED ORDER — WRIST SPLINT/COCK-UP/RIGHT M MISC: 0 refills | Status: AC

## 2022-10-09 NOTE — Patient Instructions (Signed)
Nice to see you. Please try the cock up splint for your right hand.  You may be able to get this at the pharmacy or a medical supply store or you could try ordering it from [employer].

## 2022-10-09 NOTE — Assessment & Plan Note (Signed)
Chronic issue.  Adequately controlled.  She will continue lisinopril 10 mg once daily and metoprolol 12.5 mg daily.  Lab work ordered today.

## 2022-10-09 NOTE — Progress Notes (Signed)
Marikay Alar, MD Phone: [phone]  Laurie Patel is a 89 y.o. female who presents today for f/u  DIABETES Disease Monitoring: Blood Sugar ranges-71-95 Polyuria/phagia/dipsia- no      Optho- UTD Medications: Compliance- taking no medications Patient is staying active and gardening.  Also doing physical therapy exercises.  She is gone back to eating eggs for lunch.  Still does plenty of fruits and vegetables.  Hypertension: Patient is taking lisinopril and metoprolol.  No chest pain, shortness of breath, or edema.  Right hand numbness: Patient notes intermittently on a daily basis the patient will get numbness in the palm of her right hand.  Notes it starts in her thumb and moves across her hand if she is holding her hands in certain positions.  Particularly bothers her she is holding a book or on the computer or cross stitching.   Social History   Tobacco Use  Smoking Status Never  Smokeless Tobacco Never    Current Outpatient Medications on File Prior to Visit  Medication Sig Dispense Refill   ASPIRIN 81 PO Take 81 mg by mouth daily as needed.     cholecalciferol (VITAMIN D3) 25 MCG (1000 UNIT) tablet Take 1,000 Units by mouth daily.     feeding supplement (ENSURE ENLIVE / ENSURE PLUS) LIQD Take 237 mLs by mouth 2 (two) times daily between meals. 237 mL 12   lisinopril (ZESTRIL) 10 MG tablet Take 1 tablet (10 mg total) by mouth daily. 90 tablet 1   metoprolol succinate (TOPROL XL) 25 MG 24 hr tablet Take 0.5 tablets (12.5 mg total) by mouth daily. 45 tablet 0   Multiple Vitamins-Calcium (ONE-A-DAY WOMENS PO) Take 1 tablet by mouth daily.     No current facility-administered medications on file prior to visit.     ROS see history of present illness  Objective  Physical Exam Vitals:   10/09/22 0921  BP: 114/68  Pulse: 91  Temp: 97.8 F (36.6 C)  SpO2: 95%    BP Readings from Last 3 Encounters:  10/09/22 114/68  08/22/22 (!) 165/66  07/08/22 130/70   Wt  Readings from Last 3 Encounters:  10/09/22 146 lb (66.2 kg)  08/19/22 144 lb (65.3 kg)  07/08/22 144 lb 3.2 oz (65.4 kg)    Physical Exam Constitutional:      General: She is not in acute distress.    Appearance: She is not diaphoretic.  Cardiovascular:     Rate and Rhythm: Normal rate and regular rhythm.     Heart sounds: Normal heart sounds.  Pulmonary:     Effort: Pulmonary effort is normal.     Breath sounds: Normal breath sounds.  Musculoskeletal:     Comments: Negative Tinel's right wrist, patient is unable to complete Phalen's adequately given reduced flexion range of motion bilateral wrists  Skin:    General: Skin is warm and dry.  Neurological:     Mental Status: She is alert.      Assessment/Plan: Please see individual problem list.  Controlled type 2 diabetes mellitus without complication, without long-term current use of insulin Assessment & Plan: Chronic issue.  Continue healthy diet and exercise.  Check A1c.  Orders: -     Hemoglobin A1c  Primary hypertension Assessment & Plan: Chronic issue.  Adequately controlled.  She will continue lisinopril 10 mg once daily and metoprolol 12.5 mg daily.  Lab work ordered today.  Orders: -     Basic metabolic panel  Carpal tunnel syndrome of right wrist Assessment &  Plan: Patient's right hand symptoms are likely carpal tunnel syndrome related.  I encouraged her to try a cock up splint in her right hand.  Orders: -     Wrist Splint/Cock-Up/Right M; Apply nightly.  Dispense: 1 each; Refill: 0     Return in about 6 months (around 04/10/2023).   Marikay Alar, MD [facility]

## 2022-10-09 NOTE — Assessment & Plan Note (Signed)
Patient's right hand symptoms are likely carpal tunnel syndrome related.  I encouraged her to try a cock up splint in her right hand.

## 2022-10-09 NOTE — Assessment & Plan Note (Signed)
Chronic issue.  Continue healthy diet and exercise.  Check A1c.

## 2022-10-31 ENCOUNTER — Ambulatory Visit: Payer: Medicare Other | Admitting: Podiatry

## 2022-10-31 DIAGNOSIS — E119 Type 2 diabetes mellitus without complications: Secondary | ICD-10-CM | POA: Diagnosis not present

## 2022-10-31 DIAGNOSIS — B351 Tinea unguium: Secondary | ICD-10-CM | POA: Diagnosis not present

## 2022-10-31 DIAGNOSIS — M79676 Pain in unspecified toe(s): Secondary | ICD-10-CM | POA: Diagnosis not present

## 2022-11-02 DIAGNOSIS — M542 Cervicalgia: Secondary | ICD-10-CM | POA: Diagnosis not present

## 2022-11-04 ENCOUNTER — Encounter: Payer: Self-pay | Admitting: Podiatry

## 2022-11-04 NOTE — Progress Notes (Signed)
  Subjective:  Patient ID: Laurie Patel, female    DOB: 1928-03-19,  MRN: [id number]  Laurie Patel presents to clinic today for painful elongated mycotic toenails 1-5 bilaterally which are tender when wearing enclosed shoe gear. Pain is relieved with periodic professional debridement.  Chief Complaint  Patient presents with   Nail Problem    RFC-PCP-   Glori Luis, MD PCPLOV-04/24    New problem(s): None.   PCP is Glori Luis, MD.  Allergies  Allergen Reactions   Augmentin [Amoxicillin-Pot Clavulanate] Nausea And Vomiting   Prednisone     Review of Systems: Negative except as noted in the HPI.  Objective: No changes noted in today's physical examination. There were no vitals filed for this visit. Laurie Patel is a pleasant 89 y.o. female WD, WN in NAD. AAO x 3.  Vascular Examination: CFT <3 seconds b/l. DP/PT pulses faintly palpable b/l. Skin temperature gradient warm to warm b/l. No pain with calf compression. No ischemia or gangrene. No cyanosis or clubbing noted b/l. No edema noted b/l LE.   Neurological Examination: Sensation grossly intact b/l with 10 gram monofilament. Vibratory sensation intact b/l.   Dermatological Examination: Pedal skin warm and supple b/l.   No open wounds. No interdigital macerations.  Toenails 2-5 bilaterally and L hallux well maintained with adequate length. No erythema, no edema, no drainage, no fluctuance. Anonychia noted right great toe. Nailbed(s) epithelialized.  No hyperkeratotic nor porokeratotic lesions present on today's visit.  Musculoskeletal Examination: Muscle strength 5/5 to b/l LE. No pain, crepitus or joint limitation noted with ROM bilateral LE. No gross bony deformities bilaterally.  Radiographs: None  Last A1c:      Latest Ref Rng & Units 10/09/2022    9:45 AM 05/07/2022    9:05 AM 01/15/2022    9:26 AM  Hemoglobin A1C  Hemoglobin-A1c 4.6 - 6.5 % 7.5  8.5  7.9    Assessment/Plan: 1. Pain due to  onychomycosis of toenail   2. Diabetes mellitus without complication (HCC)    -Patient was evaluated and treated. All patient's and/or POA's questions/concerns answered on today's visit. -Continue foot and shoe inspections daily. Monitor blood glucose per PCP/Endocrinologist's recommendations. -Patient to continue soft, supportive shoe gear daily. -Toenails 1-5 b/l were debrided in length and girth with sterile nail nippers and dremel without iatrogenic bleeding.  -Patient/POA to call should there be question/concern in the interim.   Return in about 10 weeks (around 01/09/2023).  Freddie Breech, DPM

## 2022-11-19 ENCOUNTER — Other Ambulatory Visit: Payer: Self-pay

## 2022-11-19 MED ORDER — METOPROLOL SUCCINATE ER 25 MG PO TB24: 12.5000 mg | ORAL_TABLET | Freq: Every day | ORAL | 0 refills | Status: DC

## 2022-11-25 ENCOUNTER — Encounter: Payer: Self-pay | Admitting: Cardiology

## 2022-11-25 ENCOUNTER — Ambulatory Visit: Payer: Medicare Other | Attending: Cardiology | Admitting: Cardiology

## 2022-11-25 VITALS — BP 162/64 | HR 86 | Ht 65.5 in | Wt 145.8 lb

## 2022-11-25 DIAGNOSIS — I5030 Unspecified diastolic (congestive) heart failure: Secondary | ICD-10-CM

## 2022-11-25 DIAGNOSIS — I480 Paroxysmal atrial fibrillation: Secondary | ICD-10-CM | POA: Diagnosis not present

## 2022-11-25 DIAGNOSIS — I10 Essential (primary) hypertension: Secondary | ICD-10-CM

## 2022-11-25 NOTE — Patient Instructions (Signed)
Medication Instructions:   Your physician recommends that you continue on your current medications as directed. Please refer to the Current Medication list given to you today.  *If you need a refill on your cardiac medications before your next appointment, please call your pharmacy*   Lab Work:  None Ordered  If you have labs (blood work) drawn today and your tests are completely normal, you will receive your results only by: MyChart Message (if you have MyChart) OR A paper copy in the mail If you have any lab test that is abnormal or we need to change your treatment, we will call you to review the results.   Testing/Procedures:  None Ordered   Follow-Up: At [facility], you and your health needs are our priority.  As part of our continuing mission to provide you with exceptional heart care, we have created designated Provider Care Teams.  These Care Teams include your primary Cardiologist (physician) and Advanced Practice Providers (APPs -  Physician Assistants and Nurse Practitioners) who all work together to provide you with the care you need, when you need it.  We recommend signing up for the patient portal called "MyChart".  Sign up information is provided on this After Visit Summary.  MyChart is used to connect with patients for Virtual Visits (Telemedicine).  Patients are able to view lab/test results, encounter notes, upcoming appointments, etc.  Non-urgent messages can be sent to your provider as well.   To learn more about what you can do with MyChart, go to [email or web address].    Your next appointment:   6 month(s)  Provider:   You may see Brian Agbor-Etang, MD or one of the following Advanced Practice Providers on your designated Care Team:   Christopher Berge, NP Ryan Dunn, PA-C Cadence Furth, PA-C Sheri Hammock, NP 

## 2022-11-25 NOTE — Progress Notes (Signed)
Cardiology Office Note:    Date:  11/25/2022   ID:  Laurie Patel, DOB 01-May-1928, MRN [id number]  PCP:  Glori Luis, MD   [facility] Providers Cardiologist:  Debbe Odea, MD     Referring MD: Glori Luis, MD   Chief Complaint  Patient presents with   Follow-up    Occasional feeling of heart "jumping" and dyspnea on exertion.      History of Present Illness:    Laurie Patel is a 89 y.o. female with a hx of HFpEF, hypertension, paroxysmal atrial fibrillation, whitecoat syndrome, diabetes, LVH who presents for follow-up.    Being seen for paroxysmal atrial fibrillation and HFpEF.  Compliant with aspirin daily, not on anticoagulation due to fall risk and history of anemia.  Started on Toprol-XL 25 mg after last visit, complaining of insomnia, reduce Toprol dose to 12.5 mg daily.  Tolerating current doses without any adverse effects.  Sleeping comfortably.  Feels well, no concerns at this time.  Blood pressures at home usually controlled.  Prior notes  Echo 01/2022 EF 60 to 65%. echocardiogram 01/2021 showing preserved ejection fraction, asymmetric septal hypertrophy up to 1.5 cm, no LVOT obstruction, mild aortic valve sclerosis. No anticoagulation due to fall risk, history of anemia    Past Medical History:  Diagnosis Date   Ankle fracture 10/14/2017   Chronic heart failure with preserved ejection fraction (HFpEF) (HCC)    Heart murmur    Hypertension    LBBB (left bundle branch block)    LVH (left ventricular hypertrophy)    a. 06/2016 Echo: EF 60-65%, no rwma, mild MR. PASP ; b. 01/2021 Echo: EF 55-60%, sev assym septal hypertrophy (LV basal-mid septum up to 1.5cm). No SAM. No rwma. GrIDD. Nl RV fxn. Mild Ao sclerosis.   Palpitations    a. 07/2016 Holter: Avg HR 77 (54-117). 59 isolated PVCs. Rare PACs (<1%) w/ 3 atrial runs - longest 7 beats, fastest 170 bpm x 4 beats - PAT.   PNA (pneumonia) 02/16/2018    Past Surgical History:  Procedure  Laterality Date   APPENDECTOMY  1986   EYE SURGERY Bilateral 1995 and 1999   Lens implants   INTRAMEDULLARY (IM) NAIL INTERTROCHANTERIC Left 02/02/2022   Procedure: INTRAMEDULLARY (IM) NAIL INTERTROCHANTERIC;  Surgeon: Juanell Fairly, MD;  Location: [facility] ORS;  Service: Orthopedics;  Laterality: Left;   WRIST SURGERY Left 1986   ORIF     Current Medications: Current Meds  Medication Sig   ASPIRIN 81 PO Take 81 mg by mouth daily as needed.   cholecalciferol (VITAMIN D3) 25 MCG (1000 UNIT) tablet Take 1,000 Units by mouth daily.   Elastic Bandages & Supports (WRIST SPLINT/COCK-UP/RIGHT M) MISC Apply nightly.   feeding supplement (ENSURE ENLIVE / ENSURE PLUS) LIQD Take 237 mLs by mouth 2 (two) times daily between meals.   lisinopril (ZESTRIL) 10 MG tablet Take 1 tablet (10 mg total) by mouth daily.   metoprolol succinate (TOPROL XL) 25 MG 24 hr tablet Take 0.5 tablets (12.5 mg total) by mouth daily.     Allergies:   Augmentin [amoxicillin-pot clavulanate] and Prednisone   Social History   Socioeconomic History   Marital status: Widowed    Spouse name: Not on file   Number of children: 5   Years of education: 1 year of college.   Highest education level: 12th grade  Occupational History   Not on file  Tobacco Use   Smoking status: Never   Smokeless tobacco: Never  Vaping Use   Vaping Use: Never used  Substance and Sexual Activity   Alcohol use: No    Alcohol/week: 0.0 standard drinks of alcohol   Drug use: No   Sexual activity: Not Currently  Other Topics Concern   Not on file  Social History Narrative   Lives in [place]    Owns a [employer]    1 dog   Social Determinants of Health   Financial Resource Strain: Low Risk  (08/19/2022)   Overall Financial Resource Strain (CARDIA)    Difficulty of Paying Living Expenses: Not hard at all  Food Insecurity: No Food Insecurity (08/19/2022)   Hunger Vital Sign    Worried About Running Out of Food in the Last Year:  Never true    Ran Out of Food in the Last Year: Never true  Transportation Needs: No Transportation Needs (08/19/2022)   PRAPARE - Administrator, Civil Service (Medical): No    Lack of Transportation (Non-Medical): No  Physical Activity: Unknown (07/02/2018)   Exercise Vital Sign    Days of Exercise per Week: 0 days    Minutes of Exercise per Session: Not on file  Stress: No Stress Concern Present (08/19/2022)   [employer] of Occupational Health - Occupational Stress Questionnaire    Feeling of Stress : Not at all  Social Connections: Unknown (08/19/2022)   Social Connection and Isolation Panel [NHANES]    Frequency of Communication with Friends and Family: More than three times a week    Frequency of Social Gatherings with Friends and Family: More than three times a week    Attends Religious Services: Not on file    Active Member of Clubs or Organizations: Not on file    Attends Banker Meetings: Not on file    Marital Status: Widowed     Family History: The patient's family history includes COPD (age of onset: 44) in her father; Cancer (age of onset: 48) in her mother; Early death in her paternal grandmother; Heart disease in her maternal grandfather, maternal grandmother, and paternal grandfather; Heart disease (age of onset: 74) in her brother; Heart disease (age of onset: 44) in her father; Obesity in her paternal grandfather.  ROS:   Please see the history of present illness.     All other systems reviewed and are negative.  EKGs/Labs/Other Studies Reviewed:    The following studies were reviewed today:   EKG:  EKG is ordered today.  EKG shows normal sinus rhythm, occasional PVCs.  Recent Labs: 02/04/2022: B Natriuretic Peptide 483.6 05/07/2022: ALT 10; Hemoglobin 14.1; Platelets 262.0; TSH 2.50 10/09/2022: BUN 17; Creatinine, Ser 0.73; Potassium 4.2; Sodium 137  Recent Lipid Panel    Component Value Date/Time   CHOL 173 03/14/2020 0941    TRIG 117.0 03/14/2020 0941   HDL 36.20 (L) 03/14/2020 0941   CHOLHDL 5 03/14/2020 0941   VLDL 23.4 03/14/2020 0941   LDLCALC 114 (H) 03/14/2020 0941   LDLDIRECT 147.0 03/21/2016 0926     Risk Assessment/Calculations:          Physical Exam:    VS:  BP (!) 162/64 (BP Location: Left Arm, Patient Position: Sitting, Cuff Size: Normal)   Pulse 86   Ht 5' 5.5" (1.664 m)   Wt 145 lb 12.8 oz (66.1 kg)   SpO2 94%   BMI 23.89 kg/m     Wt Readings from Last 3 Encounters:  11/25/22 145 lb 12.8 oz (66.1 kg)  10/09/22 146 lb (  66.2 kg)  08/19/22 144 lb (65.3 kg)     GEN:  Well nourished, well developed in no acute distress HEENT: Normal NECK: No JVD; No carotid bruits CARDIAC: RRR, faint systolic murmur RESPIRATORY:  Clear to auscultation without rales, wheezing or rhonchi  ABDOMEN: Soft, non-tender, non-distended MUSCULOSKELETAL:  No edema; No deformity  SKIN: Warm and dry NEUROLOGIC:  Alert and oriented x 3 PSYCHIATRIC:  Normal affect   ASSESSMENT:    1. Heart failure with preserved ejection fraction, unspecified HF chronicity (HCC)   2. White coat syndrome with diagnosis of hypertension   3. Paroxysmal atrial fibrillation (HCC)    PLAN:    In order of problems listed above:  HFpEF, asymmetric LVH, no plans for CMR, patient is claustrophobic.  Appears euvolemic, denies shortness of breath. Hypertension, BP elevated, she has whitecoat syndrome.  BP normal at home. Continue lisinopril 10 mg daily, Toprol-XL 12.5 mg daily. Paroxysmal atrial fibrillation, continue aspirin, Toprol-XL 12.5 mg daily.  Not on anticoagulation due to frequent falls, history of anemia with heparin.  Follow-up in 6 months.    Medication Adjustments/Labs and Tests Ordered: Current medicines are reviewed at length with the patient today.  Concerns regarding medicines are outlined above.  Orders Placed This Encounter  Procedures   EKG 12-Lead    No orders of the defined types were placed in this  encounter.    Patient Instructions  Medication Instructions:   Your physician recommends that you continue on your current medications as directed. Please refer to the Current Medication list given to you today.  *If you need a refill on your cardiac medications before your next appointment, please call your pharmacy*   Lab Work:  None Ordered  If you have labs (blood work) drawn today and your tests are completely normal, you will receive your results only by: MyChart Message (if you have MyChart) OR A paper copy in the mail If you have any lab test that is abnormal or we need to change your treatment, we will call you to review the results.   Testing/Procedures:  None Ordered   Follow-Up: At [facility], you and your health needs are our priority.  As part of our continuing mission to provide you with exceptional heart care, we have created designated Provider Care Teams.  These Care Teams include your primary Cardiologist (physician) and Advanced Practice Providers (APPs -  Physician Assistants and Nurse Practitioners) who all work together to provide you with the care you need, when you need it.  We recommend signing up for the patient portal called "MyChart".  Sign up information is provided on this After Visit Summary.  MyChart is used to connect with patients for Virtual Visits (Telemedicine).  Patients are able to view lab/test results, encounter notes, upcoming appointments, etc.  Non-urgent messages can be sent to your provider as well.   To learn more about what you can do with MyChart, go to [email or web address].    Your next appointment:   6 month(s)  Provider:   You may see Debbe Odea, MD or one of the following Advanced Practice Providers on your designated Care Team:   Nicolasa Ducking, NP Eula Listen, PA-C Cadence Fransico Michael, PA-C Charlsie Quest, NP    Signed, Debbe Odea, MD  11/25/2022 11:46 AM    [facility]

## 2023-01-13 ENCOUNTER — Encounter: Payer: Self-pay | Admitting: Podiatry

## 2023-01-13 ENCOUNTER — Ambulatory Visit: Payer: Medicare Other | Admitting: Podiatry

## 2023-01-13 DIAGNOSIS — M79676 Pain in unspecified toe(s): Secondary | ICD-10-CM | POA: Diagnosis not present

## 2023-01-13 DIAGNOSIS — E119 Type 2 diabetes mellitus without complications: Secondary | ICD-10-CM

## 2023-01-13 DIAGNOSIS — B351 Tinea unguium: Secondary | ICD-10-CM

## 2023-01-13 NOTE — Progress Notes (Signed)
  Subjective:  Patient ID: Laurie Patel, female    DOB: [birth year],  MRN: [id number]  Laurie Patel presents to clinic today for preventative diabetic foot care and painful thick toenails that are difficult to trim. Pain interferes with ambulation. Aggravating factors include wearing enclosed shoe gear. Pain is relieved with periodic professional debridement.  Chief Complaint  Patient presents with   Nail Problem    RFC,Referring Provider Glori Luis, MD,lov:04/24       New problem(s): None.   PCP is Glori Luis, MD.  Allergies  Allergen Reactions   Augmentin [Amoxicillin-Pot Clavulanate] Nausea And Vomiting   Prednisone     Review of Systems: Negative except as noted in the HPI.  Objective: No changes noted in today's physical examination. There were no vitals filed for this visit. Laurie Patel is a pleasant 89 y.o. female WD, WN in NAD. AAO x 3.  Vascular Examination: CFT <3 seconds b/l. DP/PT pulses faintly palpable b/l. Skin temperature gradient warm to warm b/l. No pain with calf compression. No ischemia or gangrene. No cyanosis or clubbing noted b/l. No edema noted b/l LE.   Neurological Examination: Sensation grossly intact b/l with 10 gram monofilament. Vibratory sensation intact b/l.   Dermatological Examination: Pedal skin warm and supple b/l.   No open wounds. No interdigital macerations.  Toenails 2-5 bilaterally and L hallux well maintained with adequate length. No erythema, no edema, no drainage, no fluctuance. Anonychia noted right great toe. Nailbed(s) epithelialized.  No hyperkeratotic nor porokeratotic lesions present on today's visit.  Musculoskeletal Examination: Muscle strength 5/5 to b/l LE. No pain, crepitus or joint limitation noted with ROM bilateral LE. No gross bony deformities bilaterally. Utilizes rollator for ambulation assistance.  Radiographs: None  Assessment/Plan: 1. Pain due to onychomycosis of toenail   2. Diabetes  mellitus without complication (HCC)     -Consent given for treatment as described below: -Examined patient. -Continue foot and shoe inspections daily. Monitor blood glucose per PCP/Endocrinologist's recommendations. -Patient to continue soft, supportive shoe gear daily. -Mycotic toenails 1-5 bilaterally were debrided in length and girth with sterile nail nippers and dremel without incident. -Patient/POA to call should there be question/concern in the interim.   Return in about 10 weeks (around 03/24/2023).  Freddie Breech, DPM

## 2023-02-02 ENCOUNTER — Other Ambulatory Visit: Payer: Self-pay | Admitting: Family Medicine

## 2023-02-02 DIAGNOSIS — I10 Essential (primary) hypertension: Secondary | ICD-10-CM

## 2023-02-14 ENCOUNTER — Other Ambulatory Visit: Payer: Self-pay

## 2023-02-14 MED ORDER — METOPROLOL SUCCINATE ER 25 MG PO TB24: 12.5000 mg | ORAL_TABLET | Freq: Every day | ORAL | 1 refills | Status: DC

## 2023-02-14 NOTE — Telephone Encounter (Signed)
Requested Prescriptions   Signed Prescriptions Disp Refills   metoprolol succinate (TOPROL XL) 25 MG 24 hr tablet 45 tablet 1    Sig: Take 0.5 tablets (12.5 mg total) by mouth daily.    Authorizing Provider: Debbe Odea    Ordering User: Guerry Minors

## 2023-04-03 ENCOUNTER — Ambulatory Visit: Payer: Medicare Other | Admitting: Podiatry

## 2023-04-03 ENCOUNTER — Encounter: Payer: Self-pay | Admitting: Podiatry

## 2023-04-03 DIAGNOSIS — E1151 Type 2 diabetes mellitus with diabetic peripheral angiopathy without gangrene: Secondary | ICD-10-CM | POA: Diagnosis not present

## 2023-04-03 DIAGNOSIS — M79676 Pain in unspecified toe(s): Secondary | ICD-10-CM | POA: Diagnosis not present

## 2023-04-03 DIAGNOSIS — B351 Tinea unguium: Secondary | ICD-10-CM

## 2023-04-03 DIAGNOSIS — E119 Type 2 diabetes mellitus without complications: Secondary | ICD-10-CM | POA: Diagnosis not present

## 2023-04-08 NOTE — Progress Notes (Signed)
ANNUAL DIABETIC FOOT EXAM  Subjective: Laurie Patel presents today annual diabetic foot exam.  Chief Complaint  Patient presents with   RFC   Patient confirms h/o diabetes.  Patient denies any h/o foot wounds.  Risk factors: diabetes, HTN, CHF, hyperlipidemia.  Laurie Luis, MD is patient's PCP.  Past Medical History:  Diagnosis Date   Ankle fracture 10/14/2017   Chronic heart failure with preserved ejection fraction (HFpEF) (HCC)    Heart murmur    Hypertension    LBBB (left bundle branch block)    LVH (left ventricular hypertrophy)    a. 06/2016 Echo: EF 60-65%, no rwma, mild MR. PASP ; b. 01/2021 Echo: EF 55-60%, sev assym septal hypertrophy (LV basal-mid septum up to 1.5cm). No SAM. No rwma. GrIDD. Nl RV fxn. Mild Ao sclerosis.   Palpitations    a. 07/2016 Holter: Avg HR 77 (54-117). 59 isolated PVCs. Rare PACs (<1%) w/ 3 atrial runs - longest 7 beats, fastest 170 bpm x 4 beats - PAT.   PNA (pneumonia) 02/16/2018   Patient Active Problem List   Diagnosis Date Noted   Carpal tunnel syndrome 10/09/2022   Right knee pain 07/08/2022   Lightheadedness 05/07/2022   Acute posthemorrhagic anemia 02/06/2022   Atelectasis 02/04/2022   Paroxysmal atrial fibrillation (HCC) 02/04/2022   Closed fracture of left proximal humerus 02/02/2022   Closed intertrochanteric fracture of hip, left, initial encounter (HCC) 02/01/2022   Chronic diastolic CHF (congestive heart failure) (HCC) 01/15/2022   History of tick bite of right lower leg 01/15/2022   Constipation 04/18/2020   Bilateral hand numbness 03/14/2020   Skin lesions 11/09/2019   Ear discomfort, right 05/12/2018   Dermatitis, eczematoid 02/16/2018   H/O: osteoarthritis 02/16/2018   Irregular cardiac rhythm 02/16/2018   Low back pain 02/16/2018   Nonspecific abnormal finding in stool contents 02/16/2018   Balance problem 10/14/2017   Palpitations 06/21/2016   Hyperlipidemia 03/21/2016   Allergic rhinitis  03/21/2016   Controlled type 2 diabetes mellitus without complication, without long-term current use of insulin (HCC) 09/14/2015   HTN (hypertension) 08/27/2014   Breathlessness on exertion 10/05/2009   OP (osteoporosis) 09/04/2009   Past Surgical History:  Procedure Laterality Date   APPENDECTOMY  1986   EYE SURGERY Bilateral 1995 and 1999   Lens implants   INTRAMEDULLARY (IM) NAIL INTERTROCHANTERIC Left 02/02/2022   Procedure: INTRAMEDULLARY (IM) NAIL INTERTROCHANTERIC;  Surgeon: Juanell Fairly, MD;  Location: [facility] ORS;  Service: Orthopedics;  Laterality: Left;   WRIST SURGERY Left 1986   ORIF    Current Outpatient Medications on File Prior to Visit  Medication Sig Dispense Refill   ASPIRIN 81 PO Take 81 mg by mouth daily as needed.     cholecalciferol (VITAMIN D3) 25 MCG (1000 UNIT) tablet Take 1,000 Units by mouth daily.     Elastic Bandages & Supports (WRIST SPLINT/COCK-UP/RIGHT M) MISC Apply nightly. 1 each 0   feeding supplement (ENSURE ENLIVE / ENSURE PLUS) LIQD Take 237 mLs by mouth 2 (two) times daily between meals. 237 mL 12   lisinopril (ZESTRIL) 10 MG tablet TAKE 1 TABLET BY MOUTH DAILY 90 tablet 1   metoprolol succinate (TOPROL XL) 25 MG 24 hr tablet Take 0.5 tablets (12.5 mg total) by mouth daily. 45 tablet 1   Multiple Vitamins-Calcium (ONE-A-DAY WOMENS PO) Take 1 tablet by mouth daily.     No current facility-administered medications on file prior to visit.    Allergies  Allergen Reactions   Augmentin [Amoxicillin-Pot Clavulanate]  Nausea And Vomiting   Prednisone    Social History   Occupational History   Not on file  Tobacco Use   Smoking status: Never   Smokeless tobacco: Never  Vaping Use   Vaping status: Never Used  Substance and Sexual Activity   Alcohol use: No    Alcohol/week: 0.0 standard drinks of alcohol   Drug use: No   Sexual activity: Not Currently   Family History  Problem Relation Age of Onset   Cancer Mother 67       Breast Cancer    Heart disease Father 55       Congestive Heart Failure   COPD Father 60       Emphysema   Heart disease Brother 46       massive heart attack   Heart disease Maternal Grandmother    Heart disease Maternal Grandfather    Early death Paternal Grandmother        Early death unsure age   Heart disease Paternal Grandfather    Obesity Paternal Grandfather    Immunization History  Administered Date(s) Administered   Fluad Quad(high Dose 65+) 03/22/2022   Influenza Nasal 02/20/2021   Influenza Split 02/24/2013, 03/24/2014, 02/29/2016   Influenza, High Dose Seasonal PF 04/10/2017, 02/14/2018, 02/10/2019, 02/22/2021   Influenza-Unspecified 04/07/2017, 02/10/2019, 03/07/2020   PFIZER(Purple Top)SARS-COV-2 Vaccination 07/30/2019, 08/20/2019, 05/23/2020, 10/03/2020   Pfizer Covid-19 Vaccine Bivalent Booster 10yrs & up 03/22/2022   Pneumococcal Conjugate-13 02/10/2019   Pneumococcal Polysaccharide-23 02/10/2019   Pneumococcal-Unspecified 03/24/2014   Rsv, Bivalent, Protein Subunit Rsvpref,pf Verdis Frederickson) 03/26/2022   Td 02/08/2010   Tdap 06/24/2008     Review of Systems: Negative except as noted in the HPI.   Objective: There were no vitals filed for this visit.  Laurie Patel is a pleasant 89 y.o. female in NAD. AAO X 3.   Title   Diabetic Foot Exam - detailed Date & Time: 04/03/2023 10:30 AM Diabetic Foot exam was performed with the following findings: Yes  Is there a history of foot ulcer?: No Is there a foot ulcer now?: No Is there swelling?: No Is there elevated skin temperature?: No Is there abnormal foot shape?: No Is there a claw toe deformity?: No Are the toenails long?: Yes Are the toenails thick?: Yes Are the toenails ingrown?: No Is the skin thin, fragile, shiny and hairless?": Yes Normal Range of Motion?: Yes Is there foot or ankle muscle weakness?: No Do you have pain in calf while walking?: No Are the shoes appropriate in style and fit?: Yes Can the patient  see the bottom of their feet?: No Right Posterior Tibialis: Present Left posterior Tibialis: Present   Right Dorsalis Pedis: Diminished Left Dorsalis Pedis: Diminished     Semmes-Weinstein Monofilament Test "+" means "has sensation" and "-" means "no sensation"  R Foot Test Control: Pos L Foot Test Control: Pos   R Site 1-Great Toe: Pos L Site 1-Great Toe: Pos   R Site 4: Pos L Site 4: Pos   R site 5: Pos L Site 5: Pos  R Site 6: Pos L Site 6: Pos     Image components are not supported.   Image components are not supported. Image components are not supported.  Tuning Fork Right vibratory: present Left vibratory: present  Comments Muscle strength 5/5 b/l. No gross bony deformities b/l. Uses rollator for mobility.    Lab Results  Component Value Date   HGBA1C 7.5 (H) 10/09/2022   ADA Risk Categorization: High  Risk  Patient has one or more of the following: Loss of protective sensation Absent pedal pulses Severe Foot deformity History of foot ulcer  Assessment: 1. Pain due to onychomycosis of toenail   2. Type II diabetes mellitus with peripheral circulatory disorder (HCC)   3. Encounter for diabetic foot exam (HCC)     Plan: -Consent given for treatment as described below: -Examined patient. -Diabetic foot examination performed today. -Continue diabetic foot care principles: inspect feet daily, monitor glucose as recommended by PCP and/or Endocrinologist, and follow prescribed diet per PCP, Endocrinologist and/or dietician. -Continue supportive shoe gear daily. -Toenails 1-5 b/l were debrided in length and girth with sterile nail nippers and dremel without iatrogenic bleeding.  -Patient/POA to call should there be question/concern in the interim. Return in about 10 weeks (around 06/12/2023).  Freddie Breech, DPM

## 2023-04-14 ENCOUNTER — Ambulatory Visit: Payer: Medicare Other | Admitting: Family Medicine

## 2023-04-14 ENCOUNTER — Encounter: Payer: Self-pay | Admitting: Family Medicine

## 2023-04-14 VITALS — BP 116/64 | HR 90 | Temp 97.5°F | Ht 65.5 in | Wt 139.8 lb

## 2023-04-14 DIAGNOSIS — S51809A Unspecified open wound of unspecified forearm, initial encounter: Secondary | ICD-10-CM | POA: Insufficient documentation

## 2023-04-14 DIAGNOSIS — Z23 Encounter for immunization: Secondary | ICD-10-CM

## 2023-04-14 DIAGNOSIS — I10 Essential (primary) hypertension: Secondary | ICD-10-CM

## 2023-04-14 DIAGNOSIS — S51801A Unspecified open wound of right forearm, initial encounter: Secondary | ICD-10-CM

## 2023-04-14 DIAGNOSIS — E119 Type 2 diabetes mellitus without complications: Secondary | ICD-10-CM | POA: Diagnosis not present

## 2023-04-14 LAB — COMPREHENSIVE METABOLIC PANEL
ALT: 13 U/L (ref 0–35)
AST: 17 U/L (ref 0–37)
Albumin: 4.4 g/dL (ref 3.5–5.2)
Alkaline Phosphatase: 69 U/L (ref 39–117)
BUN: 13 mg/dL (ref 6–23)
CO2: 31 meq/L (ref 19–32)
Calcium: 9.7 mg/dL (ref 8.4–10.5)
Chloride: 100 meq/L (ref 96–112)
Creatinine, Ser: 0.64 mg/dL (ref 0.40–1.20)
GFR: 76.08 mL/min (ref >60.00)
Glucose, Bld: 155 mg/dL — ABNORMAL HIGH (ref 70–99)
Potassium: 3.9 meq/L (ref 3.5–5.1)
Sodium: 139 meq/L (ref 135–145)
Total Bilirubin: 0.5 mg/dL (ref 0.2–1.2)
Total Protein: 6.6 g/dL (ref 6.0–8.3)

## 2023-04-14 LAB — HEMOGLOBIN A1C: Hgb A1c MFr Bld: 6.8 % — ABNORMAL HIGH (ref 4.6–6.5)

## 2023-04-14 NOTE — Patient Instructions (Signed)
Nice to see you. Will get lab work today and contact you with results.

## 2023-04-14 NOTE — Assessment & Plan Note (Signed)
Chronic.  Adequately controlled.  Patient will continue lisinopril 10 mg once daily metoprolol 12.5 mg daily.  Lab work ordered today.

## 2023-04-14 NOTE — Progress Notes (Signed)
Marikay Alar, MD Phone: [phone]  Laurie Patel is a 89 y.o. female who presents today for f/u.  HYPERTENSION Disease Monitoring Home BP Monitoring 130/70 Chest pain- no    Dyspnea- no Medications Compliance-  taking lisinopril, metoprolol.  Edema- no BMET    Component Value Date/Time   NA 137 10/09/2022 0945   NA 142 09/11/2021 1040   K 4.2 10/09/2022 0945   CL 99 10/09/2022 0945   CO2 29 10/09/2022 0945   GLUCOSE 207 (H) 10/09/2022 0945   BUN 17 10/09/2022 0945   BUN 18 09/11/2021 1040   CREATININE 0.73 10/09/2022 0945   CALCIUM 9.5 10/09/2022 0945   GFRNONAA >60 02/08/2022 0415   Wound bilateral forearms: This occurred a few days ago.  She had a new dog and the puppy scratched her forearms.  Her right forearm wound is worse than her left forearm.  She has been applying antibiotic ointment and keeping them covered.  Diabetes: Patient was going to restart Tradjenta though she got a paper insert that told her that this could cause heart failure so she held off on that.  She notes her sugars have been 67-80 fasting and around 90 at lunchtime.  Her diet has changed given that she has dentures now and has had to eat soft foods.  Social History   Tobacco Use  Smoking Status Never  Smokeless Tobacco Never    Current Outpatient Medications on File Prior to Visit  Medication Sig Dispense Refill   ASPIRIN 81 PO Take 81 mg by mouth daily as needed.     cholecalciferol (VITAMIN D3) 25 MCG (1000 UNIT) tablet Take 1,000 Units by mouth daily.     Elastic Bandages & Supports (WRIST SPLINT/COCK-UP/RIGHT M) MISC Apply nightly. 1 each 0   feeding supplement (ENSURE ENLIVE / ENSURE PLUS) LIQD Take 237 mLs by mouth 2 (two) times daily between meals. 237 mL 12   lisinopril (ZESTRIL) 10 MG tablet TAKE 1 TABLET BY MOUTH DAILY 90 tablet 1   metoprolol succinate (TOPROL XL) 25 MG 24 hr tablet Take 0.5 tablets (12.5 mg total) by mouth daily. 45 tablet 1   Multiple Vitamins-Calcium  (ONE-A-DAY WOMENS PO) Take 1 tablet by mouth daily.     No current facility-administered medications on file prior to visit.     ROS see history of present illness  Objective  Physical Exam Vitals:   04/14/23 0935  BP: 116/64  Pulse: 90  Temp: (!) 97.5 F (36.4 C)  SpO2: 97%    BP Readings from Last 3 Encounters:  04/14/23 116/64  11/25/22 (!) 162/64  10/09/22 114/68   Wt Readings from Last 3 Encounters:  04/14/23 139 lb 12.8 oz (63.4 kg)  11/25/22 145 lb 12.8 oz (66.1 kg)  10/09/22 146 lb (66.2 kg)    Physical Exam Constitutional:      General: She is not in acute distress.    Appearance: She is not diaphoretic.  Cardiovascular:     Rate and Rhythm: Normal rate and regular rhythm.     Heart sounds: Normal heart sounds.  Pulmonary:     Effort: Pulmonary effort is normal.     Breath sounds: Normal breath sounds.  Musculoskeletal:       Arms:     Comments: No signs of infection at either skin tear site  Skin:    General: Skin is warm and dry.  Neurological:     Mental Status: She is alert.      Assessment/Plan: Please see  individual problem list.  Primary hypertension Assessment & Plan: Chronic.  Adequately controlled.  Patient will continue lisinopril 10 mg once daily metoprolol 12.5 mg daily.  Lab work ordered today.  Orders: -     Comprehensive metabolic panel  Open wound of forearm, unspecified laterality, initial encounter Assessment & Plan: Patient with bilateral skin tear wounds to her forearms.  No signs of infection today.  Tetanus vaccine is not up-to-date.  Patient will be given a Td today.  She will monitor for signs of infection and if that develops to let us know.  She can use Vaseline and gauze to keep the area covered.  If not healing she will let us know.  Orders: -     Td vaccine greater than or equal to 7yo preservative free IM  Controlled type 2 diabetes mellitus without complication, without long-term current use of insulin  (HCC) Assessment & Plan: Chronic issue.  Seems to be quite well-controlled based on home CBG readings.  Will check an A1c today.  Advised patient that I could not find any evidence that Tradjenta contributes to heart failure.  This was not mentioned in the up-to-date information on the Tradjenta.  There is also an article on the HA journal that reported Tradjenta did not affect the risk of heart failure or other selected heart failure related outcomes.  Discussed at this time it does not appear that she needs this medication anyway.  Orders: -     Hemoglobin A1c     Return in about 6 months (around 10/13/2023) for transfer of care.   Marikay Alar, MD [facility]

## 2023-04-14 NOTE — Assessment & Plan Note (Addendum)
Chronic issue.  Seems to be quite well-controlled based on home CBG readings.  Will check an A1c today.  Advised patient that I could not find any evidence that Tradjenta contributes to heart failure.  This was not mentioned in the up-to-date information on the Tradjenta.  There is also an article on the HA journal that reported Tradjenta did not affect the risk of heart failure or other selected heart failure related outcomes.  Discussed at this time it does not appear that Laurie Patel needs this medication anyway.

## 2023-04-14 NOTE — Assessment & Plan Note (Signed)
Patient with bilateral skin tear wounds to her forearms.  No signs of infection today.  Tetanus vaccine is not up-to-date.  Patient will be given a Td today.  She will monitor for signs of infection and if that develops to let us know.  She can use Vaseline and gauze to keep the area covered.  If not healing she will let us know.

## 2023-05-20 DIAGNOSIS — S61002A Unspecified open wound of left thumb without damage to nail, initial encounter: Secondary | ICD-10-CM | POA: Diagnosis not present

## 2023-05-26 ENCOUNTER — Ambulatory Visit
Admission: EM | Admit: 2023-05-26 | Discharge: 2023-05-26 | Disposition: A | Discharge status: Home / Self Care | Payer: Medicare Other | Attending: Emergency Medicine | Admitting: Emergency Medicine

## 2023-05-26 DIAGNOSIS — J069 Acute upper respiratory infection, unspecified: Secondary | ICD-10-CM | POA: Diagnosis not present

## 2023-05-26 DIAGNOSIS — R0602 Shortness of breath: Secondary | ICD-10-CM

## 2023-05-26 MED ORDER — ALBUTEROL SULFATE HFA 108 (90 BASE) MCG/ACT IN AERS: 2.0000 | INHALATION_SPRAY | RESPIRATORY_TRACT | 0 refills | Status: DC | PRN

## 2023-05-26 MED ORDER — IPRATROPIUM-ALBUTEROL 0.5-2.5 (3) MG/3ML IN SOLN
3.0000 mL | Freq: Once | RESPIRATORY_TRACT | Status: AC
  Administered 2023-05-26: 3 mL via RESPIRATORY_TRACT

## 2023-05-26 MED ORDER — CEFDINIR 300 MG PO CAPS: 300.0000 mg | ORAL_CAPSULE | Freq: Two times a day (BID) | ORAL | 0 refills | Status: AC

## 2023-05-26 NOTE — ED Triage Notes (Signed)
Pt reports cough, SOB, sore throat. SOB worsened today. Provider aware of VS.

## 2023-05-26 NOTE — ED Provider Notes (Signed)
Laurie Patel    CSN: [id number] Arrival date & time: 05/26/23  1539      History   Chief Complaint Chief Complaint  Patient presents with   Shortness of Breath    HPI Laurie Patel is a 89 y.o. female.   Patient presents for evaluation of a nonproductive cough, nasal and chest congestion and sore throat beginning 2 to 3 days ago.  Has been experiencing associated shortness of breath that significantly worsened this morning.  Associated wheezing at nighttime.  Known sick contact.  Denies fever.  Decreased appetite with tolerating food and liquids.  History of CHF, heart murmur, A-fib, bundle branch block, pneumonia.    Past Medical History:  Diagnosis Date   Ankle fracture 10/14/2017   Chronic heart failure with preserved ejection fraction (HFpEF) (HCC)    Heart murmur    Hypertension    LBBB (left bundle branch block)    LVH (left ventricular hypertrophy)    a. 06/2016 Echo: EF 60-65%, no rwma, mild MR. PASP ; b. 01/2021 Echo: EF 55-60%, sev assym septal hypertrophy (LV basal-mid septum up to 1.5cm). No SAM. No rwma. GrIDD. Nl RV fxn. Mild Ao sclerosis.   Palpitations    a. 07/2016 Holter: Avg HR 77 (54-117). 59 isolated PVCs. Rare PACs (<1%) w/ 3 atrial runs - longest 7 beats, fastest 170 bpm x 4 beats - PAT.   PNA (pneumonia) 02/16/2018    Patient Active Problem List   Diagnosis Date Noted   Wound, open, arm, forearm 04/14/2023   Carpal tunnel syndrome 10/09/2022   Right knee pain 07/08/2022   Lightheadedness 05/07/2022   Acute posthemorrhagic anemia 02/06/2022   Atelectasis 02/04/2022   Paroxysmal atrial fibrillation (HCC) 02/04/2022   Closed fracture of left proximal humerus 02/02/2022   Closed intertrochanteric fracture of hip, left, initial encounter (HCC) 02/01/2022   Chronic diastolic CHF (congestive heart failure) (HCC) 01/15/2022   History of tick bite of right lower leg 01/15/2022   Constipation 04/18/2020   Bilateral hand numbness 03/14/2020    Skin lesions 11/09/2019   Ear discomfort, right 05/12/2018   Dermatitis, eczematoid 02/16/2018   H/O: osteoarthritis 02/16/2018   Irregular cardiac rhythm 02/16/2018   Low back pain 02/16/2018   Nonspecific abnormal finding in stool contents 02/16/2018   Balance problem 10/14/2017   Palpitations 06/21/2016   Hyperlipidemia 03/21/2016   Allergic rhinitis 03/21/2016   Controlled type 2 diabetes mellitus without complication, without long-term current use of insulin (HCC) 09/14/2015   HTN (hypertension) 08/27/2014   Breathlessness on exertion 10/05/2009   OP (osteoporosis) 09/04/2009    Past Surgical History:  Procedure Laterality Date   APPENDECTOMY  1986   EYE SURGERY Bilateral 1995 and 1999   Lens implants   INTRAMEDULLARY (IM) NAIL INTERTROCHANTERIC Left 02/02/2022   Procedure: INTRAMEDULLARY (IM) NAIL INTERTROCHANTERIC;  Surgeon: Juanell Fairly, MD;  Location: [facility] ORS;  Service: Orthopedics;  Laterality: Left;   WRIST SURGERY Left 1986   ORIF     OB History   No obstetric history on file.      Home Medications    Prior to Admission medications   Medication Sig Start Date End Date Taking? Authorizing Provider  albuterol (VENTOLIN HFA) 108 (90 Base) MCG/ACT inhaler Inhale 2 puffs into the lungs every 4 (four) hours as needed for wheezing or shortness of breath. 05/26/23  Yes Laurell Coalson R, NP  cefdinir (OMNICEF) 300 MG capsule Take 1 capsule (300 mg total) by mouth 2 (two) times daily for 7  days. 05/26/23 06/02/23 Yes Zander Ingham R, NP  ASPIRIN 81 PO Take 81 mg by mouth daily as needed. 02/25/22   [provider]  cholecalciferol (VITAMIN D3) 25 MCG (1000 UNIT) tablet Take 1,000 Units by mouth daily.    [provider]  Elastic Bandages & Supports (WRIST SPLINT/COCK-UP/RIGHT M) MISC Apply nightly. 10/09/22   Glori Luis, MD  feeding supplement (ENSURE ENLIVE / ENSURE PLUS) LIQD Take 237 mLs by mouth 2 (two) times daily between meals. 02/08/22    Swayze, Ava, DO  lisinopril (ZESTRIL) 10 MG tablet TAKE 1 TABLET BY MOUTH DAILY 02/03/23   Glori Luis, MD  metoprolol succinate (TOPROL XL) 25 MG 24 hr tablet Take 0.5 tablets (12.5 mg total) by mouth daily. 02/14/23   Debbe Odea, MD  Multiple Vitamins-Calcium (ONE-A-DAY WOMENS PO) Take 1 tablet by mouth daily.    [provider]    Family History Family History  Problem Relation Age of Onset   Cancer Mother 42       Breast Cancer   Heart disease Father 61       Congestive Heart Failure   COPD Father 26       Emphysema   Heart disease Brother 61       massive heart attack   Heart disease Maternal Grandmother    Heart disease Maternal Grandfather    Early death Paternal Grandmother        Early death unsure age   Heart disease Paternal Grandfather    Obesity Paternal Grandfather     Social History Social History   Tobacco Use   Smoking status: Never   Smokeless tobacco: Never  Vaping Use   Vaping status: Never Used  Substance Use Topics   Alcohol use: No    Alcohol/week: 0.0 standard drinks of alcohol   Drug use: No     Allergies   Augmentin [amoxicillin-pot clavulanate] and Prednisone   Review of Systems Review of Systems  Respiratory:  Positive for shortness of breath.      Physical Exam Triage Vital Signs ED Triage Vitals  Encounter Vitals Group     BP 05/26/23 1547 (!) 201/81     Systolic BP Percentile --      Diastolic BP Percentile --      Pulse Rate 05/26/23 1544 (!) 104     Resp 05/26/23 1544 (!) 26     Temp --      Temp src --      SpO2 05/26/23 1544 (!) 86 %     Weight --      Height --      Head Circumference --      Peak Flow --      Pain Score --      Pain Loc --      Pain Education --      Exclude from Growth Chart --    No data found.  Updated Vital Signs BP (!) 201/81   Pulse (!) 104   Resp (!) 26   SpO2 93%   Visual Acuity Right Eye Distance:   Left Eye Distance:   Bilateral Distance:    Right  Eye Near:   Left Eye Near:    Bilateral Near:     Physical Exam Constitutional:      Appearance: Normal appearance.  HENT:     Head: Normocephalic.     Right Ear: Tympanic membrane, ear canal and external ear normal.     Left  Ear: Tympanic membrane, ear canal and external ear normal.     Nose: Congestion present.     Mouth/Throat:     Mouth: Mucous membranes are moist.     Pharynx: Oropharynx is clear. No oropharyngeal exudate or posterior oropharyngeal erythema.  Hyde:     Extraocular Movements: Extraocular movements intact.  Cardiovascular:     Rate and Rhythm: Normal rate and regular rhythm.     Pulses: Normal pulses.     Heart sounds: Normal heart sounds.  Pulmonary:     Effort: Pulmonary effort is normal.     Comments: Wheezing to the upper lobes, lower lobes clear and diminished Neurological:     Mental Status: She is alert and oriented to person, place, and time. Mental status is at baseline.      UC Treatments / Results  Labs (all labs ordered are listed, but only abnormal results are displayed) Labs Reviewed - No data to display  EKG   Radiology No results found.  Procedures Procedures (including critical care time)  Medications Ordered in UC Medications  ipratropium-albuterol (DUONEB) 0.5-2.5 (3) MG/3ML nebulizer solution 3 mL (has no administration in time range)    Initial Impression / Assessment and Plan / UC Course  I have reviewed the triage vital signs and the nursing notes.  Pertinent labs & imaging results that were available during my care of the patient were reviewed by me and considered in my medical decision making (see chart for details).  Acute URI, shortness of breath  O2 saturation on initial triage 85% on room air, after resting for 10 minutes symptoms improved to 99% on room air, wheezing heard to the bilateral upper lobes, DuoNeb given and on reevaluation left side of the upper lobe has cleared and patient endorses improvement in  shortness of breath and is feeling much better, on reevaluation O2 saturation 100% on room air, did a walking test able to maintain saturation at 95%, albuterol prescribed, has intolerance to steroids, cefdinir prescribed as she has intolerance to penicillins, etiology most likely viral antibiotic will be protective to the airway, recommended over-the-counter medication for supportive care and given strict precautions for any worsening symptoms to go to the nearest emergency department for reevaluation Final Clinical Impressions(s) / UC Diagnoses   Final diagnoses:  Acute URI  SOB (shortness of breath)     Discharge Instructions      Your symptoms today are most likely being caused by a virus and should steadily improve in time it can take up to 7 to 10 days before you truly start to see a turnaround however things will get better  Exam able to hear wheezing which improved after breathing treatment as well as her overall shortness of breath  Once home you may use albuterol inhaler taking 2 puffs every 4 hours as needed for wheezing and shortness of breath  Begin cefdinir every morning and every evening for 7 days to check the airway and ideally prevent symptoms from worsening to pneumonia or bronchitis    You can take Tylenol and/or Ibuprofen as needed for fever reduction and pain relief.   For cough: honey 1/2 to 1 teaspoon (you can dilute the honey in water or another fluid).  You can also use guaifenesin and dextromethorphan for cough. You can use a humidifier for chest congestion and cough.  If you don't have a humidifier, you can sit in the bathroom with the hot shower running.      For sore throat: try warm  salt water gargles, cepacol lozenges, throat spray, warm tea or water with lemon/honey, popsicles or ice, or OTC cold relief medicine for throat discomfort.   For congestion: take a daily anti-histamine like Zyrtec, Claritin, and a oral decongestant, such as pseudoephedrine.  You  can also use Flonase 1-2 sprays in each nostril daily.   It is important to stay hydrated: drink plenty of fluids (water, gatorade/powerade/pedialyte, juices, or teas) to keep your throat moisturized and help further relieve irritation/discomfort.    ED Prescriptions     Medication Sig Dispense Auth. Provider   albuterol (VENTOLIN HFA) 108 (90 Base) MCG/ACT inhaler Inhale 2 puffs into the lungs every 4 (four) hours as needed for wheezing or shortness of breath. 17 g Nyaire Denbleyker R, NP   cefdinir (OMNICEF) 300 MG capsule Take 1 capsule (300 mg total) by mouth 2 (two) times daily for 7 days. 14 capsule Aima Mcwhirt, Elita Boone, NP      PDMP not reviewed this encounter.   Valinda Hoar, NP 05/26/23 1627

## 2023-05-26 NOTE — ED Triage Notes (Signed)
Provider to room. Family member to room.

## 2023-05-26 NOTE — Discharge Instructions (Signed)
Your symptoms today are most likely being caused by a virus and should steadily improve in time it can take up to 7 to 10 days before you truly start to see a turnaround however things will get better  Exam able to hear wheezing which improved after breathing treatment as well as her overall shortness of breath  Once home you may use albuterol inhaler taking 2 puffs every 4 hours as needed for wheezing and shortness of breath  Begin cefdinir every morning and every evening for 7 days to check the airway and ideally prevent symptoms from worsening to pneumonia or bronchitis    You can take Tylenol and/or Ibuprofen as needed for fever reduction and pain relief.   For cough: honey 1/2 to 1 teaspoon (you can dilute the honey in water or another fluid).  You can also use guaifenesin and dextromethorphan for cough. You can use a humidifier for chest congestion and cough.  If you don't have a humidifier, you can sit in the bathroom with the hot shower running.      For sore throat: try warm salt water gargles, cepacol lozenges, throat spray, warm tea or water with lemon/honey, popsicles or ice, or OTC cold relief medicine for throat discomfort.   For congestion: take a daily anti-histamine like Zyrtec, Claritin, and a oral decongestant, such as pseudoephedrine.  You can also use Flonase 1-2 sprays in each nostril daily.   It is important to stay hydrated: drink plenty of fluids (water, gatorade/powerade/pedialyte, juices, or teas) to keep your throat moisturized and help further relieve irritation/discomfort.

## 2023-05-29 ENCOUNTER — Ambulatory Visit: Payer: Medicare Other | Attending: Cardiology | Admitting: Cardiology

## 2023-05-29 ENCOUNTER — Encounter: Payer: Self-pay | Admitting: Cardiology

## 2023-05-29 VITALS — BP 134/66 | HR 67 | Ht 65.5 in | Wt 137.0 lb

## 2023-05-29 DIAGNOSIS — I5030 Unspecified diastolic (congestive) heart failure: Secondary | ICD-10-CM | POA: Diagnosis not present

## 2023-05-29 DIAGNOSIS — I10 Essential (primary) hypertension: Secondary | ICD-10-CM | POA: Diagnosis not present

## 2023-05-29 DIAGNOSIS — I480 Paroxysmal atrial fibrillation: Secondary | ICD-10-CM

## 2023-05-29 DIAGNOSIS — R062 Wheezing: Secondary | ICD-10-CM

## 2023-05-29 NOTE — Progress Notes (Signed)
Cardiology Office Note:    Date:  05/29/2023   ID:  Laurie Patel, DOB March 12, 1928, MRN [id number]  PCP:  Glori Luis, MD   [facility] Providers Cardiologist:  Debbe Odea, MD     Referring MD: Glori Luis, MD   Chief Complaint  Patient presents with   Follow-up    Patient denies new or acute cardiac problems/concerns today.      History of Present Illness:    Laurie Patel is a 89 y.o. female with a hx of HFpEF, hypertension, paroxysmal atrial fibrillation, whitecoat syndrome, diabetes, LVH who presents for follow-up.    Feeling sick over the past week, due to nasal congestion/cold.  Daughter who lives with her was also sick.  She endorsed taking all her viral immunizations.  Was seen in urgent care, albuterol, 7-day course of antibiotics were prescribed.  Currently on day #3, states feeling better although not back to self.  Otherwise doing okay, compliant with meds as prescribed.  Prior notes  Echo 01/2022 EF 60 to 65%. echocardiogram 01/2021 showing preserved ejection fraction, asymmetric septal hypertrophy up to 1.5 cm, no LVOT obstruction, mild aortic valve sclerosis. No anticoagulation due to fall risk, history of anemia    Past Medical History:  Diagnosis Date   Ankle fracture 10/14/2017   Chronic heart failure with preserved ejection fraction (HFpEF) (HCC)    Heart murmur    Hypertension    LBBB (left bundle branch block)    LVH (left ventricular hypertrophy)    a. 06/2016 Echo: EF 60-65%, no rwma, mild MR. PASP ; b. 01/2021 Echo: EF 55-60%, sev assym septal hypertrophy (LV basal-mid septum up to 1.5cm). No SAM. No rwma. GrIDD. Nl RV fxn. Mild Ao sclerosis.   Palpitations    a. 07/2016 Holter: Avg HR 77 (54-117). 59 isolated PVCs. Rare PACs (<1%) w/ 3 atrial runs - longest 7 beats, fastest 170 bpm x 4 beats - PAT.   PNA (pneumonia) 02/16/2018    Past Surgical History:  Procedure Laterality Date   APPENDECTOMY  1986   EYE SURGERY  Bilateral 1995 and 1999   Lens implants   INTRAMEDULLARY (IM) NAIL INTERTROCHANTERIC Left 02/02/2022   Procedure: INTRAMEDULLARY (IM) NAIL INTERTROCHANTERIC;  Surgeon: Juanell Fairly, MD;  Location: [facility] ORS;  Service: Orthopedics;  Laterality: Left;   WRIST SURGERY Left 1986   ORIF     Current Medications: Current Meds  Medication Sig   albuterol (VENTOLIN HFA) 108 (90 Base) MCG/ACT inhaler Inhale 2 puffs into the lungs every 4 (four) hours as needed for wheezing or shortness of breath.   ASPIRIN 81 PO Take 81 mg by mouth daily as needed.   cefdinir (OMNICEF) 300 MG capsule Take 1 capsule (300 mg total) by mouth 2 (two) times daily for 7 days.   cholecalciferol (VITAMIN D3) 25 MCG (1000 UNIT) tablet Take 1,000 Units by mouth daily.   Elastic Bandages & Supports (WRIST SPLINT/COCK-UP/RIGHT M) MISC Apply nightly.   feeding supplement (ENSURE ENLIVE / ENSURE PLUS) LIQD Take 237 mLs by mouth 2 (two) times daily between meals.   lisinopril (ZESTRIL) 10 MG tablet TAKE 1 TABLET BY MOUTH DAILY   metoprolol succinate (TOPROL XL) 25 MG 24 hr tablet Take 0.5 tablets (12.5 mg total) by mouth daily.   Multiple Vitamins-Calcium (ONE-A-DAY WOMENS PO) Take 1 tablet by mouth daily.     Allergies:   Augmentin [amoxicillin-pot clavulanate] and Prednisone   Social History   Socioeconomic History   Marital status: Widowed  Spouse name: Not on file   Number of children: 5   Years of education: 1 year of college.   Highest education level: 12th grade  Occupational History   Not on file  Tobacco Use   Smoking status: Never   Smokeless tobacco: Never  Vaping Use   Vaping status: Never Used  Substance and Sexual Activity   Alcohol use: No    Alcohol/week: 0.0 standard drinks of alcohol   Drug use: No   Sexual activity: Not Currently  Other Topics Concern   Not on file  Social History Narrative   Lives in [place]    Owns a [employer]    1 dog   Social Determinants of Health    Financial Resource Strain: Low Risk  (08/19/2022)   Overall Financial Resource Strain (CARDIA)    Difficulty of Paying Living Expenses: Not hard at all  Food Insecurity: No Food Insecurity (08/19/2022)   Hunger Vital Sign    Worried About Running Out of Food in the Last Year: Never true    Ran Out of Food in the Last Year: Never true  Transportation Needs: No Transportation Needs (08/19/2022)   PRAPARE - Administrator, Civil Service (Medical): No    Lack of Transportation (Non-Medical): No  Physical Activity: Unknown (07/02/2018)   Exercise Vital Sign    Days of Exercise per Week: 0 days    Minutes of Exercise per Session: Not on file  Stress: No Stress Concern Present (08/19/2022)   [employer] of Occupational Health - Occupational Stress Questionnaire    Feeling of Stress : Not at all  Social Connections: Unknown (08/19/2022)   Social Connection and Isolation Panel [NHANES]    Frequency of Communication with Friends and Family: More than three times a week    Frequency of Social Gatherings with Friends and Family: More than three times a week    Attends Religious Services: Not on file    Active Member of Clubs or Organizations: Not on file    Attends Banker Meetings: Not on file    Marital Status: Widowed     Family History: The patient's family history includes COPD (age of onset: 24) in her father; Cancer (age of onset: 5) in her mother; Early death in her paternal grandmother; Heart disease in her maternal grandfather, maternal grandmother, and paternal grandfather; Heart disease (age of onset: 79) in her brother; Heart disease (age of onset: 47) in her father; Obesity in her paternal grandfather.  ROS:   Please see the history of present illness.     All other systems reviewed and are negative.  EKGs/Labs/Other Studies Reviewed:    The following studies were reviewed today:  EKG Interpretation Date/Time:  Thursday May 29 2023 10:19:25  EST Ventricular Rate:  67 PR Interval:  166 QRS Duration:  130 QT Interval:  402 QTC Calculation: 424 R Axis:   -6  Text Interpretation: Sinus rhythm with occasional Premature ventricular complexes Left bundle branch block Confirmed by Debbe Odea ([id number]) on 05/29/2023 10:49:18 AM    Recent Labs: 04/14/2023: ALT 13; BUN 13; Creatinine, Ser 0.64; Potassium 3.9; Sodium 139  Recent Lipid Panel    Component Value Date/Time   CHOL 173 03/14/2020 0941   TRIG 117.0 03/14/2020 0941   HDL 36.20 (L) 03/14/2020 0941   CHOLHDL 5 03/14/2020 0941   VLDL 23.4 03/14/2020 0941   LDLCALC 114 (H) 03/14/2020 0941   LDLDIRECT 147.0 03/21/2016 0926     Risk  Assessment/Calculations:          Physical Exam:    VS:  BP 134/66 (BP Location: Left Arm, Patient Position: Sitting, Cuff Size: Normal)   Pulse 67   Ht 5' 5.5" (1.664 m)   Wt 137 lb (62.1 kg)   SpO2 92%   BMI 22.45 kg/m     Wt Readings from Last 3 Encounters:  05/29/23 137 lb (62.1 kg)  04/14/23 139 lb 12.8 oz (63.4 kg)  11/25/22 145 lb 12.8 oz (66.1 kg)     GEN:  Well nourished, well developed in no acute distress HEENT: Normal NECK: No JVD; No carotid bruits CARDIAC: RRR, faint systolic murmur RESPIRATORY: Mild expiratory wheezing. ABDOMEN: Soft, non-tender, non-distended MUSCULOSKELETAL:  No edema; No deformity  SKIN: Warm and dry NEUROLOGIC:  Alert and oriented x 3 PSYCHIATRIC:  Normal affect   ASSESSMENT:    1. Heart failure with preserved ejection fraction, unspecified HF chronicity (HCC)   2. White coat syndrome with hypertension   3. Paroxysmal atrial fibrillation (HCC)   4. Wheezing    PLAN:    In order of problems listed above:  HFpEF, asymmetric LVH, no plans for CMR, patient is claustrophobic.  Appears euvolemic, denies shortness of breath.  Continue Toprol-XL 12.5 mg daily. Hypertension with whitecoat syndrome.  BP controlled. Continue lisinopril 10 mg daily, Toprol-XL 12.5 mg daily. Paroxysmal  atrial fibrillation, continue aspirin, Toprol-XL 12.5 mg daily.  Not on anticoagulation due to fall risk. Expiratory wheezing noted on exam, recent diagnosis of airway disease/congestion.  Started on Omnicef at urgent care.  Follow-up with PCP if symptoms not improved after antibiotic course.  Follow-up in 6 months.    Medication Adjustments/Labs and Tests Ordered: Current medicines are reviewed at length with the patient today.  Concerns regarding medicines are outlined above.  Orders Placed This Encounter  Procedures   EKG 12-Lead    No orders of the defined types were placed in this encounter.    Patient Instructions  Medication Instructions:   Your physician recommends that you continue on your current medications as directed. Please refer to the Current Medication list given to you today.  *If you need a refill on your cardiac medications before your next appointment, please call your pharmacy*   Lab Work:  NONE  If you have labs (blood work) drawn today and your tests are completely normal, you will receive your results only by: MyChart Message (if you have MyChart) OR A paper copy in the mail If you have any lab test that is abnormal or we need to change your treatment, we will call you to review the results.   Testing/Procedures:  NONE   Follow-Up: At [facility], you and your health needs are our priority.  As part of our continuing mission to provide you with exceptional heart care, we have created designated Provider Care Teams.  These Care Teams include your primary Cardiologist (physician) and Advanced Practice Providers (APPs -  Physician Assistants and Nurse Practitioners) who all work together to provide you with the care you need, when you need it.  We recommend signing up for the patient portal called "MyChart".  Sign up information is provided on this After Visit Summary.  MyChart is used to connect with patients for Virtual Visits  (Telemedicine).  Patients are able to view lab/test results, encounter notes, upcoming appointments, etc.  Non-urgent messages can be sent to your provider as well.   To learn more about what you can do with MyChart,  go to [email or web address].    Your next appointment:   6 month(s)  Provider:   You may see Debbe Odea, MD or one of the following Advanced Practice Providers on your designated Care Team:   Nicolasa Ducking, NP Eula Listen, PA-C Cadence Fransico Michael, PA-C Charlsie Quest, NP Carlos Levering, NP     Signed, Debbe Odea, MD  05/29/2023 12:09 PM    [facility]

## 2023-05-29 NOTE — Patient Instructions (Signed)
Medication Instructions:   Your physician recommends that you continue on your current medications as directed. Please refer to the Current Medication list given to you today.  *If you need a refill on your cardiac medications before your next appointment, please call your pharmacy*   Lab Work:  NONE  If you have labs (blood work) drawn today and your tests are completely normal, you will receive your results only by: MyChart Message (if you have MyChart) OR A paper copy in the mail If you have any lab test that is abnormal or we need to change your treatment, we will call you to review the results.   Testing/Procedures:  NONE   Follow-Up: At [facility], you and your health needs are our priority.  As part of our continuing mission to provide you with exceptional heart care, we have created designated Provider Care Teams.  These Care Teams include your primary Cardiologist (physician) and Advanced Practice Providers (APPs -  Physician Assistants and Nurse Practitioners) who all work together to provide you with the care you need, when you need it.  We recommend signing up for the patient portal called "MyChart".  Sign up information is provided on this After Visit Summary.  MyChart is used to connect with patients for Virtual Visits (Telemedicine).  Patients are able to view lab/test results, encounter notes, upcoming appointments, etc.  Non-urgent messages can be sent to your provider as well.   To learn more about what you can do with MyChart, go to [email or web address].    Your next appointment:   6 month(s)  Provider:   You may see Debbe Odea, MD or one of the following Advanced Practice Providers on your designated Care Team:   Nicolasa Ducking, NP Eula Listen, PA-C Cadence Fransico Michael, PA-C Charlsie Quest, NP Carlos Levering, NP

## 2023-06-02 ENCOUNTER — Telehealth: Payer: Self-pay

## 2023-06-02 NOTE — Telephone Encounter (Signed)
Lvm for pt to give office a call back

## 2023-06-02 NOTE — Telephone Encounter (Signed)
Patient states she went to Urgent Care for a sinus infection, coughing and wheezing, sort of short of breath, but under control with her inhaler.  Patient states she would like to have an appointment to see Dr. Marikay Alar.  Patient states she can only come in on Tuesday, Wednesday, or Thursday.  I scheduled an appointment for patient to see Dr. Birdie Sons tomorrow at 11am.  I transferred call to Access Nurse.

## 2023-06-02 NOTE — Telephone Encounter (Signed)
Patient is scheduled for 06/03/23 at 11:00 with you.

## 2023-06-02 NOTE — Telephone Encounter (Signed)
Noted. Please call her and see what symptoms she is currently having. Is she having shortness of breath or coughing blood up? If she is having those symptoms she needs to be seen sooner than tomorrow.

## 2023-06-03 ENCOUNTER — Ambulatory Visit: Payer: Medicare Other | Admitting: Family Medicine

## 2023-06-03 ENCOUNTER — Encounter: Payer: Self-pay | Admitting: Family Medicine

## 2023-06-03 VITALS — BP 138/60 | HR 71 | Temp 98.0°F | Ht 65.5 in | Wt 137.2 lb

## 2023-06-03 DIAGNOSIS — Z1152 Encounter for screening for COVID-19: Secondary | ICD-10-CM | POA: Diagnosis not present

## 2023-06-03 DIAGNOSIS — J0140 Acute pansinusitis, unspecified: Secondary | ICD-10-CM

## 2023-06-03 DIAGNOSIS — J329 Chronic sinusitis, unspecified: Secondary | ICD-10-CM | POA: Insufficient documentation

## 2023-06-03 LAB — POC COVID19 BINAXNOW: SARS Coronavirus 2 Ag: NEGATIVE

## 2023-06-03 NOTE — Assessment & Plan Note (Signed)
Patient's symptoms have resolved at this time.  She will monitor for any worsening or recurrence.

## 2023-06-03 NOTE — Telephone Encounter (Signed)
Noted. Will see today.  

## 2023-06-03 NOTE — Progress Notes (Signed)
  Laurie Alar, MD Phone: [phone]  Laurie Patel is a 89 y.o. female who presents today for same-day visit.  Sinus infection: Patient reports being treated for a sinus infection previously.  Notes symptoms started with rhinorrhea, scratchy throat, and cough.  She notes going to urgent care as she had some shortness of breath and she reports they gave her breathing treatment and she improved with that.  They sent her home with antibiotics and an inhaler.  She has finished the antibiotics this past weekend and is feeling significantly better.  She wants to make sure she is not wheezing.  She notes no cough or shortness of breath at this time.  Social History   Tobacco Use  Smoking Status Never  Smokeless Tobacco Never    Current Outpatient Medications on File Prior to Visit  Medication Sig Dispense Refill   albuterol (VENTOLIN HFA) 108 (90 Base) MCG/ACT inhaler Inhale 2 puffs into the lungs every 4 (four) hours as needed for wheezing or shortness of breath. 17 g 0   ASPIRIN 81 PO Take 81 mg by mouth daily as needed.     cholecalciferol (VITAMIN D3) 25 MCG (1000 UNIT) tablet Take 1,000 Units by mouth daily.     Elastic Bandages & Supports (WRIST SPLINT/COCK-UP/RIGHT M) MISC Apply nightly. 1 each 0   feeding supplement (ENSURE ENLIVE / ENSURE PLUS) LIQD Take 237 mLs by mouth 2 (two) times daily between meals. 237 mL 12   lisinopril (ZESTRIL) 10 MG tablet TAKE 1 TABLET BY MOUTH DAILY 90 tablet 1   metoprolol succinate (TOPROL XL) 25 MG 24 hr tablet Take 0.5 tablets (12.5 mg total) by mouth daily. 45 tablet 1   Multiple Vitamins-Calcium (ONE-A-DAY WOMENS PO) Take 1 tablet by mouth daily.     No current facility-administered medications on file prior to visit.     ROS see history of present illness  Objective  Physical Exam Vitals:   06/03/23 1108  BP: 138/60  Pulse: 71  Temp: 98 F (36.7 C)  SpO2: 93%    BP Readings from Last 3 Encounters:  06/03/23 138/60  05/29/23  134/66  05/26/23 (!) 201/81   Wt Readings from Last 3 Encounters:  06/03/23 137 lb 3.2 oz (62.2 kg)  05/29/23 137 lb (62.1 kg)  04/14/23 139 lb 12.8 oz (63.4 kg)    Physical Exam Constitutional:      General: She is not in acute distress.    Appearance: She is not diaphoretic.  Cardiovascular:     Rate and Rhythm: Normal rate and regular rhythm.     Heart sounds: Normal heart sounds.  Pulmonary:     Effort: Pulmonary effort is normal.     Breath sounds: Normal breath sounds.  Skin:    General: Skin is warm and dry.  Neurological:     Mental Status: She is alert.      Assessment/Plan: Please see individual problem list.  Acute non-recurrent pansinusitis Assessment & Plan: Patient's symptoms have resolved at this time.  She will monitor for any worsening or recurrence.   Encounter for screening for COVID-19 -     POC COVID-19 BinaxNow     Return if symptoms worsen or fail to improve.   Laurie Alar, MD [facility]

## 2023-06-11 ENCOUNTER — Telehealth: Payer: Self-pay

## 2023-06-11 NOTE — Telephone Encounter (Signed)
I left a voicemail for patient asking her to please call us back.  When patient calls, we need to reschedule her appointment on 10/10/2023 with Bethanie Dicker, NP, as this is Good Friday and our office will be closed.

## 2023-06-12 ENCOUNTER — Ambulatory Visit: Payer: Medicare Other | Admitting: Podiatry

## 2023-06-12 DIAGNOSIS — E1151 Type 2 diabetes mellitus with diabetic peripheral angiopathy without gangrene: Secondary | ICD-10-CM | POA: Diagnosis not present

## 2023-06-12 DIAGNOSIS — M79676 Pain in unspecified toe(s): Secondary | ICD-10-CM

## 2023-06-12 DIAGNOSIS — B351 Tinea unguium: Secondary | ICD-10-CM

## 2023-06-22 ENCOUNTER — Encounter: Payer: Self-pay | Admitting: Podiatry

## 2023-06-22 NOTE — Progress Notes (Signed)
°  Subjective:  Patient ID: Laurie Patel, female    DOB: [birth year],  MRN: [id number]  89 y.o. female presents at risk foot care. Pt has h/o NIDDM with PAD and painful elongated mycotic toenails 1-5 bilaterally which are tender when wearing enclosed shoe gear. Pain is relieved with periodic professional debridement.  New problem(s): None   PCP is Glori Luis, MD , and last visit was June 03, 2023.  Allergies  Allergen Reactions   Augmentin [Amoxicillin-Pot Clavulanate] Nausea And Vomiting   Prednisone     Review of Systems: Negative except as noted in the HPI.   Objective:  Laurie Patel is a pleasant 89 y.o. female WD, WN in NAD. AAO x 3.  Vascular Examination: Nonpalpable DP pulses b/l. Palpable PT pulses b/l. CFT <3 seconds b/l. Pedal hair absent. No edema. No pain with calf compression b/l. Skin temperature gradient WNL b/l. No varicosities noted. No cyanosis or clubbing noted.  Neurological Examination: Sensation grossly intact b/l with 10 gram monofilament. Vibratory sensation intact b/l.  Dermatological Examination: Pedal skin with normal turgor, texture and tone b/l. No open wounds nor interdigital macerations noted. Toenails 1-5 b/l thick, discolored, elongated with subungual debris and pain on dorsal palpation. No hyperkeratotic lesions noted b/l.   Musculoskeletal Examination: Muscle strength 5/5 to b/l LE.  No pain, crepitus noted b/l. No gross pedal deformities. Utilizes rollator for ambulation assistance.  Radiographs: None  Last A1c:      Latest Ref Rng & Units 04/14/2023   10:20 AM 10/09/2022    9:45 AM  Hemoglobin A1C  Hemoglobin-A1c 4.6 - 6.5 % 6.8  7.5      Assessment:   1. Pain due to onychomycosis of toenail   2. Type II diabetes mellitus with peripheral circulatory disorder [facility])     Plan:  -Patient was evaluated today. All questions/concerns addressed on today's visit. -Continue foot and shoe inspections daily. Monitor blood glucose  per PCP/Endocrinologist's recommendations. -Mycotic toenails 1-5 bilaterally were debrided in length and girth with sterile nail nippers and dremel without incident. -Patient/POA to call should there be question/concern in the interim.  Return in about 10 weeks (around 08/21/2023).  Freddie Breech, DPM      [place] LOCATION: [address] [place] [address]                   Office [phone]   [facility] LOCATION: [address] [place], [place] [address] Office [phone]

## 2023-06-23 DIAGNOSIS — E119 Type 2 diabetes mellitus without complications: Secondary | ICD-10-CM | POA: Diagnosis not present

## 2023-06-23 DIAGNOSIS — Z961 Presence of intraocular lens: Secondary | ICD-10-CM | POA: Diagnosis not present

## 2023-06-23 LAB — HM DIABETES EYE EXAM

## 2023-08-03 ENCOUNTER — Other Ambulatory Visit: Payer: Self-pay | Admitting: Family Medicine

## 2023-08-03 DIAGNOSIS — I10 Essential (primary) hypertension: Secondary | ICD-10-CM

## 2023-08-11 ENCOUNTER — Other Ambulatory Visit: Payer: Self-pay

## 2023-08-11 MED ORDER — METOPROLOL SUCCINATE ER 25 MG PO TB24: 12.5000 mg | ORAL_TABLET | Freq: Every day | ORAL | 1 refills | Status: DC

## 2023-08-11 NOTE — Telephone Encounter (Signed)
Last office visit: 05/29/23 with plan to f/u in 6 months Next office visit: 11/25/23  Requested Prescriptions   Signed Prescriptions Disp Refills   metoprolol succinate (TOPROL XL) 25 MG 24 hr tablet 45 tablet 1    Sig: Take 0.5 tablets (12.5 mg total) by mouth daily.    Authorizing Provider: Debbe Odea    Ordering User: Guerry Minors

## 2023-08-15 DIAGNOSIS — G5601 Carpal tunnel syndrome, right upper limb: Secondary | ICD-10-CM | POA: Diagnosis not present

## 2023-08-18 ENCOUNTER — Encounter: Payer: Self-pay | Admitting: Family Medicine

## 2023-09-04 DIAGNOSIS — G5601 Carpal tunnel syndrome, right upper limb: Secondary | ICD-10-CM | POA: Diagnosis not present

## 2023-09-10 ENCOUNTER — Ambulatory Visit (INDEPENDENT_AMBULATORY_CARE_PROVIDER_SITE_OTHER): Admitting: *Deleted

## 2023-09-10 VITALS — Ht 65.5 in | Wt 139.0 lb

## 2023-09-10 DIAGNOSIS — Z0000 Encounter for general adult medical examination without abnormal findings: Secondary | ICD-10-CM | POA: Diagnosis not present

## 2023-09-10 NOTE — Progress Notes (Signed)
 Subjective:   Laurie Patel is a 89 y.o. who presents for a Medicare Wellness preventive visit.  Visit Complete: Virtual I connected with  Tristine Langi Suen on 09/10/23 by a audio enabled telemedicine application and verified that I am speaking with the correct person using two identifiers.  Patient Location: Home  Provider Location: Office/Clinic  I discussed the limitations of evaluation and management by telemedicine. The patient expressed understanding and agreed to proceed.  Vital Signs: Because this visit was a virtual/telehealth visit, some criteria may be missing or patient reported. Any vitals not documented were not able to be obtained and vitals that have been documented are patient reported.  VideoDeclined- This patient declined Librarian, academic. Therefore the visit was completed with audio only.  Persons Participating in Visit: Patient.  AWV Questionnaire: No: Patient Medicare AWV questionnaire was not completed prior to this visit.  Cardiac Risk Factors include: advanced age (>75men, >41 women);diabetes mellitus;dyslipidemia;hypertension     Objective:    Today's Vitals   09/10/23 1403  Weight: 139 lb (63 kg)  Height: 5' 5.5" (1.664 m)   Body mass index is 22.78 kg/m.     09/10/2023    2:24 PM 08/19/2022   11:15 AM 02/02/2022    8:54 AM 02/01/2022    6:21 PM 09/08/2021    9:29 AM 07/11/2021   10:14 AM 07/10/2021    7:34 PM  Advanced Directives  Does Patient Have a Medical Advance Directive? Yes Yes No No No Yes Yes  Type of Estate agent of [place];Living will Healthcare Power of [place];Living will    Living will Living will  Does patient want to make changes to medical advance directive?  No - Patient declined       Copy of Healthcare Power of Attorney in Chart? No - copy requested No - copy requested       Would patient like information on creating a medical advance directive?   No - Patient declined         Current Medications (verified) Outpatient Encounter Medications as of 09/10/2023  Medication Sig   albuterol (VENTOLIN HFA) 108 (90 Base) MCG/ACT inhaler Inhale 2 puffs into the lungs every 4 (four) hours as needed for wheezing or shortness of breath.   ASPIRIN 81 PO Take 81 mg by mouth daily.   cholecalciferol (VITAMIN D3) 25 MCG (1000 UNIT) tablet Take 1,000 Units by mouth daily.   Elastic Bandages & Supports (WRIST SPLINT/COCK-UP/RIGHT M) MISC Apply nightly.   feeding supplement (ENSURE ENLIVE / ENSURE PLUS) LIQD Take 237 mLs by mouth 2 (two) times daily between meals.   lisinopril (ZESTRIL) 10 MG tablet TAKE 1 TABLET BY MOUTH DAILY   metoprolol succinate (TOPROL XL) 25 MG 24 hr tablet Take 0.5 tablets (12.5 mg total) by mouth daily.   Multiple Vitamins-Calcium (ONE-A-DAY WOMENS PO) Take 1 tablet by mouth daily.   No facility-administered encounter medications on file as of 09/10/2023.    Allergies (verified) Augmentin [amoxicillin-pot clavulanate] and Prednisone   History: Past Medical History:  Diagnosis Date   Ankle fracture 10/14/2017   Chronic heart failure with preserved ejection fraction (HFpEF) (HCC)    Heart murmur    Hypertension    LBBB (left bundle branch block)    LVH (left ventricular hypertrophy)    a. 06/2016 Echo: EF 60-65%, no rwma, mild MR. PASP ; b. 01/2021 Echo: EF 55-60%, sev assym septal hypertrophy (LV basal-mid septum up to 1.5cm). No SAM. No rwma.  GrIDD. Nl RV fxn. Mild Ao sclerosis.   Palpitations    a. 07/2016 Holter: Avg HR 77 (54-117). 59 isolated PVCs. Rare PACs (<1%) w/ 3 atrial runs - longest 7 beats, fastest 170 bpm x 4 beats - PAT.   PNA (pneumonia) 02/16/2018   Past Surgical History:  Procedure Laterality Date   APPENDECTOMY  1986   EYE SURGERY Bilateral 1995 and 1999   Lens implants   INTRAMEDULLARY (IM) NAIL INTERTROCHANTERIC Left 02/02/2022   Procedure: INTRAMEDULLARY (IM) NAIL INTERTROCHANTERIC;  Surgeon: Juanell Fairly, MD;   Location: [facility] ORS;  Service: Orthopedics;  Laterality: Left;   WRIST SURGERY Left 1986   ORIF    Family History  Problem Relation Age of Onset   Cancer Mother 48       Breast Cancer   Heart disease Father 67       Congestive Heart Failure   COPD Father 69       Emphysema   Heart disease Brother 41       massive heart attack   Heart disease Maternal Grandmother    Heart disease Maternal Grandfather    Early death Paternal Grandmother        Early death unsure age   Heart disease Paternal Grandfather    Obesity Paternal Grandfather    Social History   Socioeconomic History   Marital status: Widowed    Spouse name: Not on file   Number of children: 5   Years of education: 1 year of college.   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    1 dog   Social Drivers of Health   Financial Resource Strain: Low Risk  (09/10/2023)   Overall Financial Resource Strain (CARDIA)    Difficulty of Paying Living Expenses: Not hard at all  Food Insecurity: No Food Insecurity (09/10/2023)   Hunger Vital Sign    Worried About Running Out of Food in the Last Year: Never true    Ran Out of Food in the Last Year: Never true  Transportation Needs: No Transportation Needs (09/10/2023)   PRAPARE - Administrator, Civil Service (Medical): No    Lack of Transportation (Non-Medical): No  Physical Activity: Inactive (09/10/2023)   Exercise Vital Sign    Days of Exercise per Week: 0 days    Minutes of Exercise per Session: 0 min  Stress: No Stress Concern Present (09/10/2023)   [employer] of  Occupational Health - Occupational Stress Questionnaire    Feeling of Stress : Not at all  Social Connections: Moderately Integrated (09/10/2023)   Social Connection and Isolation Panel [NHANES]    Frequency of Communication with Friends and Family: Once a week    Frequency of Social Gatherings with Friends and Family: More than three times a week    Attends Religious Services: More than 4 times per year    Active Member of [employer] or Organizations: Yes    Attends Banker Meetings: More than 4 times per year  Marital Status: Widowed    Tobacco Counseling Counseling given: Not Answered    Clinical Intake:  Pre-visit preparation completed: Yes  Pain : No/denies pain     BMI - recorded: 22.78 Nutritional Status: BMI of 19-24  Normal Nutritional Risks: None Diabetes: Yes CBG done?: No Did pt. bring in CBG monitor from home?: No  Lab Results  Component Value Date   HGBA1C 6.8 (H) 04/14/2023   HGBA1C 7.5 (H) 10/09/2022   HGBA1C 8.5 (H) 05/07/2022     How often do you need to have someone help you when you read instructions, pamphlets, or other written materials from your doctor or pharmacy?: 1 - Never  Interpreter Needed?: No  Information entered by :: R. Desirai Traxler LPN   Activities of Daily Living     09/10/2023    2:07 PM  In your present state of health, do you have any difficulty performing the following activities:  Hearing? 1  Comment wears aids  Vision? 0  Comment glasses  Difficulty concentrating or making decisions? 0  Walking or climbing stairs? 0  Dressing or bathing? 0  Doing errands, shopping? 1  Preparing Food and eating ? N  Using the Toilet? N  In the past six months, have you accidently leaked urine? N  Do you have problems with loss of bowel control? N  Managing your Medications? N  Managing your Finances? N  Housekeeping or managing your Housekeeping? N    Patient Care Team: Glori Luis, MD (Inactive) as PCP - General  (Family Medicine) Debbe Odea, MD as PCP - Cardiology (Cardiology)  Indicate any recent Medical Services you may have received from other than [facility] providers in the past year (date may be approximate).     Assessment:   This is a routine wellness examination for [place].  Hearing/Vision screen Hearing Screening - Comments:: Wears aids Vision Screening - Comments:: glasses   Goals Addressed             This Visit's Progress    Patient Stated       Wants to make it to 95 and stay active       Depression Screen     09/10/2023    2:16 PM 04/14/2023    9:52 AM 10/09/2022    9:23 AM 08/19/2022   11:07 AM 07/08/2022   10:30 AM 05/07/2022    8:38 AM 01/15/2022    8:53 AM  PHQ 2/9 Scores  PHQ - 2 Score 0 0 0 0 0 0 0  PHQ- 9 Score 0 0 0        Fall Risk     09/10/2023    2:11 PM 04/14/2023    9:52 AM 10/09/2022    9:23 AM 08/19/2022   11:07 AM 07/08/2022   10:30 AM  Fall Risk   Falls in the past year? 0 0 0 0 0  Number falls in past yr: 0 0 0 0 0  Injury with Fall? 0 0 0 0 0  Risk for fall due to : No Fall Risks No Fall Risks No Fall Risks  No Fall Risks  Follow up Falls prevention discussed;Falls evaluation completed Falls evaluation completed Falls evaluation completed Falls evaluation completed;Falls prevention discussed Falls evaluation completed    MEDICARE RISK AT HOME:  Medicare Risk at Home Any stairs in or around the home?: Yes If so, are there any without handrails?: No Home free of loose throw rugs in walkways, pet beds, electrical cords, etc?: Yes Adequate  lighting in your home to reduce risk of falls?: Yes Life alert?: No Use of a cane, walker or w/c?: Yes Grab bars in the bathroom?: Yes Shower chair or bench in shower?: Yes Elevated toilet seat or a handicapped toilet?: Yes  TIMED UP AND GO:  Was the test performed?  No  Cognitive Function: 6CIT completed    06/26/2017    9:37 AM  MMSE - Mini Mental State Exam  Orientation to time 5   Orientation to Place 5  Registration 3  Attention/ Calculation 5  Recall 3  Language- name 2 objects 2  Language- repeat 1  Language- follow 3 step command 3  Language- read & follow direction 1  Write a sentence 1  Copy design 1  Total score 30        09/10/2023    2:26 PM 08/19/2022   11:11 AM 07/11/2021   10:21 AM 07/08/2019   10:02 AM 07/02/2018    9:35 AM  6CIT Screen  What Year? 0 points 0 points 0 points 0 points 0 points  What month? 0 points 0 points 0 points 0 points 0 points  What time? 0 points 0 points 0 points 0 points 0 points  Count back from 20 0 points 0 points  0 points 0 points  Months in reverse 0 points 0 points  0 points 0 points  Repeat phrase 4 points 0 points  0 points 0 points  Total Score 4 points 0 points  0 points 0 points    Immunizations Immunization History  Administered Date(s) Administered   Fluad Quad(high Dose 65+) 03/22/2022   Influenza Nasal 02/20/2021   Influenza Split 02/24/2013, 03/24/2014, 02/29/2016   Influenza, High Dose Seasonal PF 04/10/2017, 02/14/2018, 02/10/2019, 02/22/2021   Influenza-Unspecified 04/07/2017, 02/10/2019, 03/07/2020   PFIZER(Purple Top)SARS-COV-2 Vaccination 07/30/2019, 08/20/2019, 05/23/2020, 10/03/2020   Pfizer Covid-19 Vaccine Bivalent Booster 46yrs & up 03/22/2022   Pneumococcal Conjugate-13 02/10/2019   Pneumococcal Polysaccharide-23 02/10/2019   Pneumococcal-Unspecified 03/24/2014   Rsv, Bivalent, Protein Subunit Rsvpref,pf (Abrysvo) 03/26/2022   Td 02/08/2010, 04/14/2023   Tdap 06/24/2008   Unspecified SARS-COV-2 Vaccination 03/24/2023   Zoster Recombinant(Shingrix) 03/24/2023    Screening Tests Health Maintenance  Topic Date Due   Zoster Vaccines- Shingrix (2 of 2) 05/19/2023   INFLUENZA VACCINE  09/22/2023 (Originally 01/23/2023)   COVID-19 Vaccine (7 - 2024-25 season) 09/21/2023   HEMOGLOBIN A1C  10/13/2023   FOOT EXAM  04/02/2024   OPHTHALMOLOGY EXAM  06/22/2024   Medicare Annual Wellness  (AWV)  09/09/2024   DTaP/Tdap/Td (4 - Td or Tdap) 04/13/2033   Pneumonia Vaccine 42+ Years old  Completed   DEXA SCAN  Completed   HPV VACCINES  Aged Out    Health Maintenance  Health Maintenance Due  Topic Date Due   Zoster Vaccines- Shingrix (2 of 2) 05/19/2023   Health Maintenance Items Addressed: Patient declines Bone density test.  Additional Screening:  Vision Screening: Recommended annual ophthalmology exams for early detection of glaucoma and other disorders of the eye.Up to date [facility]  Dental Screening: Recommended annual dental exams for proper oral hygiene  Community Resource Referral / Chronic Care Management: CRR required this visit?  No   CCM required this visit?  No     Plan:     I have personally reviewed and noted the following in the patient's chart:   Medical and social history Use of alcohol, tobacco or illicit drugs  Current medications and supplements including opioid prescriptions. Patient is not  currently taking opioid prescriptions. Functional ability and status Nutritional status Physical activity Advanced directives List of other physicians Hospitalizations, surgeries, and ER visits in previous 12 months Vitals Screenings to include cognitive, depression, and falls Referrals and appointments  In addition, I have reviewed and discussed with patient certain preventive protocols, quality metrics, and best practice recommendations. A written personalized care plan for preventive services as well as general preventive health recommendations were provided to patient.     Sydell Axon, LPN   09/30/8117   After Visit Summary: (MyChart) Due to this being a telephonic visit, the after visit summary with patients personalized plan was offered to patient via MyChart   Notes: Nothing significant to report at this time.

## 2023-09-10 NOTE — Patient Instructions (Signed)
 Laurie Patel , Thank you for taking time to come for your Medicare Wellness Visit. I appreciate your ongoing commitment to your health goals. Please review the following plan we discussed and let me know if I can assist you in the future.   Referrals/Orders/Follow-Ups/Clinician Recommendations: Your vaccines have been updated in your records.  This is a list of the screening recommended for you and due dates:  Health Maintenance  Topic Date Due   Hemoglobin A1C  10/13/2023   Complete foot exam   04/02/2024   Eye exam for diabetics  06/22/2024   Medicare Annual Wellness Visit  09/09/2024   DTaP/Tdap/Td vaccine (4 - Td or Tdap) 04/13/2033   Pneumonia Vaccine  Completed   Flu Shot  Completed   DEXA scan (bone density measurement)  Completed   COVID-19 Vaccine  Completed   Zoster (Shingles) Vaccine  Completed   HPV Vaccine  Aged Out    Advanced directives: (Copy Requested) Please bring a copy of your health care power of attorney and living will to the office to be added to your chart at your convenience. You can mail to [facility] [address] [place], [place] [address] or email to [email or web address]  Next Medicare Annual Wellness Visit scheduled for next year: Yes

## 2023-09-11 ENCOUNTER — Ambulatory Visit: Payer: Medicare Other | Admitting: Podiatry

## 2023-09-11 ENCOUNTER — Encounter: Payer: Self-pay | Admitting: Podiatry

## 2023-09-11 DIAGNOSIS — B351 Tinea unguium: Secondary | ICD-10-CM | POA: Diagnosis not present

## 2023-09-11 DIAGNOSIS — E1151 Type 2 diabetes mellitus with diabetic peripheral angiopathy without gangrene: Secondary | ICD-10-CM

## 2023-09-11 DIAGNOSIS — M79676 Pain in unspecified toe(s): Secondary | ICD-10-CM

## 2023-09-18 NOTE — Progress Notes (Signed)
  Subjective:  Patient ID: Laurie Patel, female    DOB: Sep 27, 1927,  MRN: [id number]  89 y.o. female presents at risk foot care. Pt has h/o NIDDM with PAD and painful, elongated thickened toenails x 10 which are symptomatic when wearing enclosed shoe gear. This interferes with his/her daily activities.  New problem(s): None   PCP is Glori Luis, MD (Inactive) , and last visit was April 14, 2023.  Allergies  Allergen Reactions   Augmentin [Amoxicillin-Pot Clavulanate] Nausea And Vomiting   Prednisone     Review of Systems: Negative except as noted in the HPI.   Objective:  Laurie Patel is a pleasant 89 y.o. female WD, WN in NAD. AAO x 3.  Vascular Examination: CFT <3 seconds b/l. DP pulses faintly palpable b/l. PT pulses palpable b/l. Digital hair absent. Skin temperature gradient warm to warm b/l. No pain with calf compression. No ischemia or gangrene. No cyanosis or clubbing noted b/l.    Neurological Examination: Sensation grossly intact b/l with 10 gram monofilament. Vibratory sensation intact b/l.   Dermatological Examination: Pedal skin warm and supple b/l. No open wounds. No interdigital macerations. Toenails 1-5 b/l thick, discolored, elongated with subungual debris and pain on dorsal palpation.  No corns, calluses nor porokeratotic lesions noted.  Musculoskeletal Examination: Muscle strength 5/5 to all lower extremity muscle groups bilaterally. No pain, crepitus or joint limitation noted with ROM bilateral LE. No gross bony deformities bilaterally. Utilizes rollator for ambulation assistance.  Radiographs: None  Last HgA1c:      Latest Ref Rng & Units 04/14/2023   10:20 AM 10/09/2022    9:45 AM  Hemoglobin A1C  Hemoglobin-A1c 4.6 - 6.5 % 6.8  7.5    Assessment:   1. Pain due to onychomycosis of toenail   2. Type II diabetes mellitus with peripheral circulatory disorder [facility])    Plan:  Patient was evaluated and treated. All patient's and/or POA's  questions/concerns addressed on today's visit. Mycotic toenails 1-5 debrided in length and girth without incident.  Continue daily foot inspections and monitor blood glucose per PCP/Endocrinologist's recommendations.Continue soft, supportive shoe gear daily. Report any pedal injuries to medical professional. Call office if there are any quesitons/concerns. -Patient/POA to call should there be question/concern in the interim.  Return in about 10 weeks (around 11/20/2023).  Freddie Breech, DPM      [facility] LOCATION: [address], [place] [address]                   Office [phone]   [facility] LOCATION: [address] [place], [place] [address] Office [phone]

## 2023-09-24 DIAGNOSIS — G5601 Carpal tunnel syndrome, right upper limb: Secondary | ICD-10-CM | POA: Diagnosis not present

## 2023-10-10 ENCOUNTER — Encounter: Payer: Medicare Other | Admitting: Nurse Practitioner

## 2023-10-22 ENCOUNTER — Encounter: Payer: Self-pay | Admitting: Nurse Practitioner

## 2023-10-22 ENCOUNTER — Ambulatory Visit: Payer: Medicare Other | Admitting: Nurse Practitioner

## 2023-10-22 VITALS — BP 138/50 | HR 81 | Temp 98.2°F | Ht 65.5 in | Wt 139.6 lb

## 2023-10-22 DIAGNOSIS — I480 Paroxysmal atrial fibrillation: Secondary | ICD-10-CM | POA: Diagnosis not present

## 2023-10-22 DIAGNOSIS — E119 Type 2 diabetes mellitus without complications: Secondary | ICD-10-CM | POA: Diagnosis not present

## 2023-10-22 DIAGNOSIS — I10 Essential (primary) hypertension: Secondary | ICD-10-CM | POA: Diagnosis not present

## 2023-10-22 DIAGNOSIS — G5601 Carpal tunnel syndrome, right upper limb: Secondary | ICD-10-CM

## 2023-10-22 LAB — COMPREHENSIVE METABOLIC PANEL WITH GFR
ALT: 13 U/L (ref 0–35)
AST: 14 U/L (ref 0–37)
Albumin: 4.3 g/dL (ref 3.5–5.2)
Alkaline Phosphatase: 84 U/L (ref 39–117)
BUN: 17 mg/dL (ref 6–23)
CO2: 32 meq/L (ref 19–32)
Calcium: 9.5 mg/dL (ref 8.4–10.5)
Chloride: 96 meq/L (ref 96–112)
Creatinine, Ser: 0.73 mg/dL (ref 0.40–1.20)
GFR: 70.53 mL/min (ref >60.00)
Glucose, Bld: 254 mg/dL — ABNORMAL HIGH (ref 70–99)
Potassium: 4.2 meq/L (ref 3.5–5.1)
Sodium: 135 meq/L (ref 135–145)
Total Bilirubin: 0.5 mg/dL (ref 0.2–1.2)
Total Protein: 6.5 g/dL (ref 6.0–8.3)

## 2023-10-22 LAB — CBC WITH DIFFERENTIAL/PLATELET
Basophils Absolute: 0 10*3/uL (ref 0.0–0.1)
Basophils Relative: 0.6 % (ref 0.0–3.0)
Eosinophils Absolute: 0.1 10*3/uL (ref 0.0–0.7)
Eosinophils Relative: 2.1 % (ref 0.0–5.0)
HCT: 45.3 % (ref 36.0–46.0)
Hemoglobin: 14.8 g/dL (ref 12.0–15.0)
Lymphocytes Relative: 16.8 % (ref 12.0–46.0)
Lymphs Abs: 1.2 10*3/uL (ref 0.7–4.0)
MCHC: 32.8 g/dL (ref 30.0–36.0)
MCV: 82.6 fl (ref 78.0–100.0)
Monocytes Absolute: 0.4 10*3/uL (ref 0.1–1.0)
Monocytes Relative: 6.1 % (ref 3.0–12.0)
Neutro Abs: 5.2 10*3/uL (ref 1.4–7.7)
Neutrophils Relative %: 74.4 % (ref 43.0–77.0)
Platelets: 244 10*3/uL (ref 150.0–400.0)
RBC: 5.48 Mil/uL — ABNORMAL HIGH (ref 3.87–5.11)
RDW: 15.1 % (ref 11.5–15.5)
WBC: 7 10*3/uL (ref 4.0–10.5)

## 2023-10-22 LAB — HEMOGLOBIN A1C: Hgb A1c MFr Bld: 7.3 % — ABNORMAL HIGH (ref 4.6–6.5)

## 2023-10-22 NOTE — Assessment & Plan Note (Signed)
 Blood sugar levels are well-controlled with diet, with the last A1c at 6.8. Occasional hypoglycemia is managed with dietary adjustments. Continue dietary management and monitor blood sugar levels as needed. Check A1c today.

## 2023-10-22 NOTE — Progress Notes (Signed)
 Laurie Burkitt, NP-C Phone: [phone]  Laurie Patel is a 89 y.o. female who presents today for transfer of care.   Discussed the use of AI scribe software for clinical note transcription with the patient, who gave verbal consent to proceed.  History of Present Illness   Laurie Patel is a 89 year old female who presents for a transfer of care visit.  She experiences ongoing issues with carpal tunnel syndrome, particularly in her right hand, which affects her ability to pick up objects and hold a pen. A recent corticosteroid injection provided temporary relief, but symptoms have persisted. 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  Her cardiovascular history includes atrial fibrillation managed with aspirin  and hypertension controlled with lisinopril  10 mg daily and metoprolol  XL 12.5 mg daily. Home blood pressure readings are typically around 130/70 mmHg. No chest pain, but she experiences intermittent shortness of breath and dizziness, which she attributes to nasal congestion and rapid movements. She uses a rollator for mobility and has experienced falls, resulting in a broken hip and shoulder.  She manages her diabetes with diet, having previously been on Tradjenta . Blood sugar levels range from 70 to 117 mg/dL, and she experiences occasional hypoglycemic symptoms such as shakiness and lightheadedness, which improve with food intake. Her last A1c was 6.8%.  Dental issues following a fall led to the removal of her bottom teeth and the use of dentures, which cause oral sores. She manages the discomfort by adjusting the dentures herself and using saltwater rinses. Her diet is affected, limiting her ability to eat certain foods like salads and tough meats.  She has a history of osteoporosis, which she believes was exacerbated by a fall. Occasional leg swelling occurs, particularly in the leg with a previous ankle fracture, which swells after prolonged standing or  wearing improper footwear.      Social History   Tobacco Use  Smoking Status Never  Smokeless Tobacco Never    Current Outpatient Medications on File Prior to Visit  Medication Sig Dispense Refill   lisinopril  (ZESTRIL ) 10 MG tablet TAKE 1 TABLET BY MOUTH DAILY 90 tablet 1   metoprolol  succinate (TOPROL  XL) 25 MG 24 hr tablet Take 0.5 tablets (12.5 mg total) by mouth daily. 45 tablet 1   albuterol  (VENTOLIN  HFA) 108 (90 Base) MCG/ACT inhaler Inhale 2 puffs into the lungs every 4 (four) hours as needed for wheezing or shortness of breath. 17 g 0   ASPIRIN  81 PO Take 81 mg by mouth daily.     cholecalciferol  (VITAMIN D3) 25 MCG (1000 UNIT) tablet Take 1,000 Units by mouth daily.     Elastic Bandages & Supports (WRIST SPLINT/COCK-UP/RIGHT M) MISC Apply nightly. 1 each 0   Multiple Vitamins-Calcium (ONE-A-DAY WOMENS PO) Take 1 tablet by mouth daily.     No current facility-administered medications on file prior to visit.     ROS see history of present illness  Objective  Physical Exam Vitals:   10/22/23 0851  BP: (!) 138/50  Pulse: 81  Temp: 98.2 F (36.8 C)  SpO2: 91%    BP Readings from Last 3 Encounters:  10/22/23 (!) 138/50  06/03/23 138/60  05/29/23 134/66   Wt Readings from Last 3 Encounters:  10/22/23 139 lb 9.6 oz (63.3 kg)  09/10/23 139 lb (63 kg)  06/03/23 137 lb 3.2 oz (62.2 kg)    Physical Exam Constitutional:      General: She is not in acute distress.    Appearance:  Normal appearance.  HENT:     Head: Normocephalic.  Cardiovascular:     Rate and Rhythm: Normal rate and regular rhythm.     Heart sounds: Normal heart sounds.  Pulmonary:     Effort: Pulmonary effort is normal.     Breath sounds: Normal breath sounds.  Skin:    General: Skin is warm and dry.  Neurological:     General: No focal deficit present.     Mental Status: She is alert.  Psychiatric:        Mood and Affect: Mood normal.        Behavior: Behavior normal.       Assessment/Plan: Please see individual problem list.  Controlled type 2 diabetes mellitus without complication, without long-term current use of insulin  (HCC) Assessment & Plan: Blood sugar levels are well-controlled with diet, with the last A1c at 6.8. Occasional hypoglycemia is managed with dietary adjustments. Continue dietary management and monitor blood sugar levels as needed. Check A1c today.  Orders: -     Hemoglobin A1c  Primary hypertension Assessment & Plan: Her blood pressure is well-controlled with current medication for her age, with home readings around 130/70 mmHg. Continue lisinopril  10 mg daily and metoprolol  12.5 mg daily. Encourage regular home blood pressure monitoring.  Orders: -     Comprehensive metabolic panel with GFR  Paroxysmal atrial fibrillation (HCC) Assessment & Plan: Managed with aspirin  and metoprolol  12.5 mg daily, with no recent episodes of chest pain or shortness of breath. No anticoagulation due to risk assessment. Continue current management.   Orders: -     CBC with Differential/Platelet  Carpal tunnel syndrome of right wrist Assessment & Plan: She has carpal tunnel syndrome in the right hand, with temporary relief from a corticosteroid injection. Symptoms include difficulty with fine motor tasks. Consider follow-up with [facility] if symptoms worsen.     Return in about 6 months (around 04/22/2024) for Follow up.   Laurie Burkitt, NP-C [facility]

## 2023-10-22 NOTE — Assessment & Plan Note (Signed)
 Managed with aspirin  and metoprolol  12.5 mg daily, with no recent episodes of chest pain or shortness of breath. No anticoagulation due to risk assessment. Continue current management.

## 2023-10-22 NOTE — Assessment & Plan Note (Signed)
 She has carpal tunnel syndrome in the right hand, with temporary relief from a corticosteroid injection. Symptoms include difficulty with fine motor tasks. Consider follow-up with [facility] if symptoms worsen.

## 2023-10-22 NOTE — Assessment & Plan Note (Signed)
 Her blood pressure is well-controlled with current medication for her age, with home readings around 130/70 mmHg. Continue lisinopril  10 mg daily and metoprolol  12.5 mg daily. Encourage regular home blood pressure monitoring.

## 2023-10-23 ENCOUNTER — Encounter: Payer: Self-pay | Admitting: Nurse Practitioner

## 2023-11-25 ENCOUNTER — Ambulatory Visit: Payer: Medicare Other | Attending: Cardiology | Admitting: Cardiology

## 2023-11-25 ENCOUNTER — Encounter: Payer: Self-pay | Admitting: Cardiology

## 2023-11-25 VITALS — BP 160/68 | HR 88 | Ht 65.0 in | Wt 141.2 lb

## 2023-11-25 DIAGNOSIS — I10 Essential (primary) hypertension: Secondary | ICD-10-CM | POA: Diagnosis not present

## 2023-11-25 DIAGNOSIS — I480 Paroxysmal atrial fibrillation: Secondary | ICD-10-CM

## 2023-11-25 DIAGNOSIS — I5030 Unspecified diastolic (congestive) heart failure: Secondary | ICD-10-CM

## 2023-11-25 NOTE — Progress Notes (Signed)
 Cardiology Office Note:    Date:  11/25/2023   ID:  Laurie Patel, DOB 07/07/1927, MRN [id number]  PCP:  [name], NP   [facility] Providers Cardiologist:  Constancia Delton, MD     Referring MD: Kent Pear, MD   Chief Complaint  Patient presents with   Follow-up    6 month follow up ,medication reviewed verbally with patient    History of Present Illness:    Laurie Patel is a 89 y.o. female with a hx of HFpEF, hypertension, paroxysmal atrial fibrillation, whitecoat syndrome, diabetes, LVH who presents for follow-up.    Doing okay, denies chest pain/shortness of breath.  Denies any falls.  Compliant with medications as prescribed.  Feels well, has no concerns at this time.   Prior notes  Echo 01/2022 EF 60 to 65%. echocardiogram 01/2021 showing preserved ejection fraction, asymmetric septal hypertrophy up to 1.5 cm, no LVOT obstruction, mild aortic valve sclerosis. No anticoagulation due to fall risk, history of anemia    Past Medical History:  Diagnosis Date   Ankle fracture 10/14/2017   Chronic heart failure with preserved ejection fraction (HFpEF) (HCC)    Heart murmur    Hypertension    LBBB (left bundle branch block)    LVH (left ventricular hypertrophy)    a. 06/2016 Echo: EF 60-65%, no rwma, mild MR. PASP ; b. 01/2021 Echo: EF 55-60%, sev assym septal hypertrophy (LV basal-mid septum up to 1.5cm). No SAM. No rwma. GrIDD. Nl RV fxn. Mild Ao sclerosis.   Palpitations    a. 07/2016 Holter: Avg HR 77 (54-117). 59 isolated PVCs. Rare PACs (<1%) w/ 3 atrial runs - longest 7 beats, fastest 170 bpm x 4 beats - PAT.   PNA (pneumonia) 02/16/2018    Past Surgical History:  Procedure Laterality Date   APPENDECTOMY  1986   EYE SURGERY Bilateral 1995 and 1999   Lens implants   INTRAMEDULLARY (IM) NAIL INTERTROCHANTERIC Left 02/02/2022   Procedure: INTRAMEDULLARY (IM) NAIL INTERTROCHANTERIC;  Surgeon: Rande Bushy, MD;  Location: [facility] ORS;  Service:  Orthopedics;  Laterality: Left;   WRIST SURGERY Left 1986   ORIF     Current Medications: Current Meds  Medication Sig   albuterol  (VENTOLIN  HFA) 108 (90 Base) MCG/ACT inhaler Inhale 2 puffs into the lungs every 4 (four) hours as needed for wheezing or shortness of breath.   ASPIRIN  81 PO Take 81 mg by mouth daily.   cholecalciferol  (VITAMIN D3) 25 MCG (1000 UNIT) tablet Take 1,000 Units by mouth daily.   Elastic Bandages & Supports (WRIST SPLINT/COCK-UP/RIGHT M) MISC Apply nightly.   lisinopril  (ZESTRIL ) 10 MG tablet TAKE 1 TABLET BY MOUTH DAILY   metoprolol  succinate (TOPROL  XL) 25 MG 24 hr tablet Take 0.5 tablets (12.5 mg total) by mouth daily.   Multiple Vitamins-Calcium (ONE-A-DAY WOMENS PO) Take 1 tablet by mouth daily.     Allergies:   Augmentin  [amoxicillin -pot clavulanate] and Prednisone   Social History   Socioeconomic History   Marital status: Widowed    Spouse name: Not on file   Number of children: 5   Years of education: 1 year of college.   Highest education level: 12th grade  Occupational History   Not on file  Tobacco Use   Smoking status: Never   Smokeless tobacco: Never  Vaping Use   Vaping status: Never Used  Substance and Sexual Activity   Alcohol use: No    Alcohol/week: 0.0 standard drinks of alcohol   Drug  use: No   Sexual activity: Not Currently  Other Topics Concern   Not on file  Social History Narrative   Lives in [place]    Owns a [employer]    1 dog   Social Drivers of Health   Financial Resource Strain: Low Risk  (09/10/2023)   Overall Financial Resource Strain (CARDIA)    Difficulty of Paying Living Expenses: Not hard at all  Food Insecurity: No Food Insecurity (09/10/2023)   Hunger Vital Sign    Worried About Running Out of Food in the Last Year: Never true    Ran Out of Food in the Last Year: Never true  Transportation Needs: No Transportation Needs (09/10/2023)   PRAPARE - Administrator, Civil Service  (Medical): No    Lack of Transportation (Non-Medical): No  Physical Activity: Inactive (09/10/2023)   Exercise Vital Sign    Days of Exercise per Week: 0 days    Minutes of Exercise per Session: 0 min  Stress: No Stress Concern Present (09/10/2023)   [employer] of Occupational Health - Occupational Stress Questionnaire    Feeling of Stress : Not at all  Social Connections: Moderately Integrated (09/10/2023)   Social Connection and Isolation Panel [NHANES]    Frequency of Communication with Friends and Family: Once a week    Frequency of Social Gatherings with Friends and Family: More than three times a week    Attends Religious Services: More than 4 times per year    Active Member of [employer] or Organizations: Yes    Attends Banker Meetings: More than 4 times per year    Marital Status: Widowed     Family History: The patient's family history includes COPD (age of onset: 48) in her father; Cancer (age of onset: 42) in her mother; Early death in her paternal grandmother; Heart disease in her maternal grandfather, maternal grandmother, and paternal grandfather; Heart disease (age of onset: 76) in her brother; Heart disease (age of onset: 42) in her father; Obesity in her paternal grandfather.  ROS:   Please see the history of present illness.     All other systems reviewed and are negative.  EKGs/Labs/Other Studies Reviewed:    The following studies were reviewed today:  EKG Interpretation Date/Time:  Tuesday November 25 2023 09:59:43 EDT Ventricular Rate:  88 PR Interval:  168 QRS Duration:  148 QT Interval:  388 QTC Calculation: 469 R Axis:   2  Text Interpretation: Sinus rhythm with Premature atrial complexes with Abberant conduction Left bundle branch block Confirmed by Constancia Delton ([id number]) on 11/25/2023 10:12:06 AM    Recent Labs: 10/22/2023: ALT 13; BUN 17; Creatinine, Ser 0.73; Hemoglobin 14.8; Platelets 244.0; Potassium 4.2; Sodium 135  Recent Lipid  Panel    Component Value Date/Time   CHOL 173 03/14/2020 0941   TRIG 117.0 03/14/2020 0941   HDL 36.20 (L) 03/14/2020 0941   CHOLHDL 5 03/14/2020 0941   VLDL 23.4 03/14/2020 0941   LDLCALC 114 (H) 03/14/2020 0941   LDLDIRECT 147.0 03/21/2016 0926     Risk Assessment/Calculations:          Physical Exam:    VS:  BP (!) 160/68 (BP Location: Left Arm, Patient Position: Sitting, Cuff Size: Normal)   Pulse 88   Ht 5\' 5"  (1.651 m)   Wt 141 lb 3.2 oz (64 kg)   SpO2 94%   BMI 23.50 kg/m     Wt Readings from Last 3 Encounters:  11/25/23 141  lb 3.2 oz (64 kg)  10/22/23 139 lb 9.6 oz (63.3 kg)  09/10/23 139 lb (63 kg)     GEN:  Well nourished, well developed in no acute distress HEENT: Normal NECK: No JVD; No carotid bruits CARDIAC: RRR, faint systolic murmur RESPIRATORY: Mild expiratory wheezing. ABDOMEN: Soft, non-tender, non-distended MUSCULOSKELETAL:  No edema; No deformity  SKIN: Warm and dry NEUROLOGIC:  Alert and oriented x 3 PSYCHIATRIC:  Normal affect   ASSESSMENT:    1. Heart failure with preserved ejection fraction, unspecified HF chronicity (HCC)   2. White coat syndrome with hypertension   3. Paroxysmal atrial fibrillation (HCC)    PLAN:    In order of problems listed above:  HFpEF, asymmetric LVH, no plans for CMR, patient is claustrophobic.  Appears euvolemic, denies shortness of breath.  Continue Toprol -XL 12.5 mg daily. Hypertension with whitecoat syndrome. Continue lisinopril  10 mg daily, Toprol -XL 12.5 mg daily. Paroxysmal atrial fibrillation, continue aspirin , Toprol -XL 12.5 mg daily.  Not on anticoagulation due to fall risk.  Follow-up in 6-12 months.    Medication Adjustments/Labs and Tests Ordered: Current medicines are reviewed at length with the patient today.  Concerns regarding medicines are outlined above.  Orders Placed This Encounter  Procedures   EKG 12-Lead    No orders of the defined types were placed in this  encounter.    Patient Instructions  Medication Instructions:  Your Physician recommend you continue on your current medication as directed.    *If you need a refill on your cardiac medications before your next appointment, please call your pharmacy*  Lab Work: No labs ordered today  If you have labs (blood work) drawn today and your tests are completely normal, you will receive your results only by: MyChart Message (if you have MyChart) OR A paper copy in the mail If you have any lab test that is abnormal or we need to change your treatment, we will call you to review the results.  Testing/Procedures: No test ordered today   Follow-Up: At [facility], you and your health needs are our priority.  As part of our continuing mission to provide you with exceptional heart care, our providers are all part of one team.  This team includes your primary Cardiologist (physician) and Advanced Practice Providers or APPs (Physician Assistants and Nurse Practitioners) who all work together to provide you with the care you need, when you need it.  Your next appointment:   6 month(s)  Provider:   You may see Constancia Delton, MD or one of the following Advanced Practice Providers on your designated Care Team:   Laneta Pintos, NP Gildardo Labrador, PA-C Varney Gentleman, PA-C Cadence [place], PA-C Ronald Cockayne, NP Morey Ar, NP    We recommend signing up for the patient portal called "MyChart".  Sign up information is provided on this After Visit Summary.  MyChart is used to connect with patients for Virtual Visits (Telemedicine).  Patients are able to view lab/test results, encounter notes, upcoming appointments, etc.  Non-urgent messages can be sent to your provider as well.   To learn more about what you can do with MyChart, go to [email or web address].          Signed, Constancia Delton, MD  11/25/2023 11:24 AM    [facility]

## 2023-11-25 NOTE — Patient Instructions (Signed)
 Medication Instructions:  Your Physician recommend you continue on your current medication as directed.    *If you need a refill on your cardiac medications before your next appointment, please call your pharmacy*  Lab Work: No labs ordered today  If you have labs (blood work) drawn today and your tests are completely normal, you will receive your results only by: MyChart Message (if you have MyChart) OR A paper copy in the mail If you have any lab test that is abnormal or we need to change your treatment, we will call you to review the results.  Testing/Procedures: No test ordered today   Follow-Up: At [facility], you and your health needs are our priority.  As part of our continuing mission to provide you with exceptional heart care, our providers are all part of one team.  This team includes your primary Cardiologist (physician) and Advanced Practice Providers or APPs (Physician Assistants and Nurse Practitioners) who all work together to provide you with the care you need, when you need it.  Your next appointment:   6 month(s)  Provider:   You may see Constancia Delton, MD or one of the following Advanced Practice Providers on your designated Care Team:   Laneta Pintos, NP Gildardo Labrador, PA-C Varney Gentleman, PA-C Cadence [place], PA-C Ronald Cockayne, NP Morey Ar, NP    We recommend signing up for the patient portal called "MyChart".  Sign up information is provided on this After Visit Summary.  MyChart is used to connect with patients for Virtual Visits (Telemedicine).  Patients are able to view lab/test results, encounter notes, upcoming appointments, etc.  Non-urgent messages can be sent to your provider as well.   To learn more about what you can do with MyChart, go to [email or web address].

## 2023-11-27 ENCOUNTER — Ambulatory Visit: Admitting: Podiatry

## 2024-01-07 ENCOUNTER — Ambulatory Visit (INDEPENDENT_AMBULATORY_CARE_PROVIDER_SITE_OTHER): Admitting: Podiatry

## 2024-01-07 DIAGNOSIS — Z91199 Patient's noncompliance with other medical treatment and regimen due to unspecified reason: Secondary | ICD-10-CM

## 2024-01-11 NOTE — Progress Notes (Signed)
 1. No-show for appointment

## 2024-01-15 ENCOUNTER — Ambulatory Visit: Admitting: Podiatry

## 2024-01-22 ENCOUNTER — Ambulatory Visit: Admitting: Nurse Practitioner

## 2024-02-04 ENCOUNTER — Other Ambulatory Visit: Payer: Self-pay | Admitting: Cardiology

## 2024-02-09 ENCOUNTER — Other Ambulatory Visit: Payer: Self-pay

## 2024-02-09 DIAGNOSIS — I10 Essential (primary) hypertension: Secondary | ICD-10-CM

## 2024-02-10 MED ORDER — LISINOPRIL 10 MG PO TABS: 10.0000 mg | ORAL_TABLET | Freq: Every day | ORAL | 3 refills | Status: DC

## 2024-02-16 ENCOUNTER — Ambulatory Visit: Admitting: Podiatry

## 2024-02-16 ENCOUNTER — Encounter: Payer: Self-pay | Admitting: Podiatry

## 2024-02-16 DIAGNOSIS — B351 Tinea unguium: Secondary | ICD-10-CM

## 2024-02-16 DIAGNOSIS — M79676 Pain in unspecified toe(s): Secondary | ICD-10-CM | POA: Diagnosis not present

## 2024-02-16 DIAGNOSIS — E1151 Type 2 diabetes mellitus with diabetic peripheral angiopathy without gangrene: Secondary | ICD-10-CM

## 2024-02-20 ENCOUNTER — Encounter: Payer: Self-pay | Admitting: Podiatry

## 2024-02-20 NOTE — Progress Notes (Signed)
  Subjective:  Patient ID: Laurie Patel, female    DOB: Jul 09, 1927,  MRN: [id number]  Laurie Patel presents to clinic today for at risk foot care. Pt has h/o NIDDM with PAD and painful mycotic toenails of both feet that are difficult to trim. Pain interferes with daily activities and wearing enclosed shoe gear comfortably.  Chief Complaint  Patient presents with   [facility]    Rm3 Routine foot care Dr. Leron Glance last visit April 30,2025   New problem(s): None.   PCP is Glance Leron, NP.  Allergies  Allergen Reactions   Augmentin  [Amoxicillin -Pot Clavulanate] Nausea And Vomiting   Prednisone     Other Reaction(s): Not available    Review of Systems: Negative except as noted in the HPI.  Objective: No changes noted in today's physical examination. There were no vitals filed for this visit. Laurie Patel is a pleasant 89 y.o. female WD, WN in NAD. AAO x 3.  Vascular Examination: CFT <3 seconds b/l. DP/PT pulses faintly palpable b/l. Pedal edema absent. Skin temperature gradient warm to warm b/l. Digital hair diminished. No pain with calf compression. No ischemia or gangrene. No cyanosis or clubbing noted b/l.    Neurological Examination: Sensation grossly intact b/l with 10 gram monofilament. Vibratory sensation intact b/l.   Dermatological Examination: Pedal skin warm and supple b/l.   No open wounds. No interdigital macerations.  Toenails 1-5 b/l thick, discolored, elongated with subungual debris and pain on dorsal palpation.    No hyperkeratotic nor porokeratotic lesions.  Musculoskeletal Examination: Normal muscle strength 5/5 to all lower extremity muscle groups bilaterally. No pain, crepitus or joint limitation noted with ROM b/l LE. No gross bony pedal deformities b/l. Patient ambulates with rollator assistance.  Radiographs: None  Last A1c:      Latest Ref Rng & Units 10/22/2023    9:12 AM 04/14/2023   10:20 AM  Hemoglobin A1C  Hemoglobin-A1c 4.6 - 6.5 % 7.3   6.8    Assessment/Plan: 1. Pain due to onychomycosis of toenail   2. Type II diabetes mellitus with peripheral circulatory disorder [facility])     Consent given for treatment. Patient examined. All patient's and/or POA's questions/concerns addressed on today's visit. Toenails 1-5 debrided in length and girth without incident. Continue foot and shoe inspections daily. Monitor blood glucose per PCP/Endocrinologist's recommendations. Continue soft, supportive shoe gear daily. Report any pedal injuries to medical professional. Call office if there are any questions/concerns. -Patient/POA to call should there be question/concern in the interim.   Return in about 10 weeks (around 04/26/2024).  Delon LITTIE Merlin, DPM      [place] LOCATION: [address] [place] [address]                   Office [phone]   [facility] LOCATION: [address] [place], [place] [address] Office [phone]

## 2024-02-24 ENCOUNTER — Encounter: Payer: Self-pay | Admitting: Nurse Practitioner

## 2024-02-24 ENCOUNTER — Ambulatory Visit: Admitting: Nurse Practitioner

## 2024-02-24 VITALS — BP 160/62 | HR 73 | Temp 98.1°F | Ht 65.0 in | Wt 143.4 lb

## 2024-02-24 DIAGNOSIS — I5032 Chronic diastolic (congestive) heart failure: Secondary | ICD-10-CM | POA: Diagnosis not present

## 2024-02-24 DIAGNOSIS — E119 Type 2 diabetes mellitus without complications: Secondary | ICD-10-CM

## 2024-02-24 DIAGNOSIS — I480 Paroxysmal atrial fibrillation: Secondary | ICD-10-CM

## 2024-02-24 DIAGNOSIS — I10 Essential (primary) hypertension: Secondary | ICD-10-CM

## 2024-02-24 MED ORDER — FUROSEMIDE 20 MG PO TABS: 20.0000 mg | ORAL_TABLET | Freq: Every day | ORAL | 0 refills | Status: DC

## 2024-02-24 NOTE — Progress Notes (Signed)
 Leron Glance, NP-C Phone: [phone]  Laurie Patel is a 89 y.o. female who presents today for follow up.   Discussed the use of AI scribe software for clinical note transcription with the patient, who gave verbal consent to proceed.  History of Present Illness   Laurie Patel is a 89 year old female with hypertension who presents for a six-month follow-up.  Laurie Patel has been managing hypertension for approximately 35 years. Her lisinopril  was initially started at 20 mg, reduced to 5 mg, and then increased to 10 mg due to a breathing issue. Recently, Laurie Patel experienced elevated blood pressure readings, notably 194/unknown at a blood donation appointment, which prevented her from donating. At home, her blood pressure typically ranges from 130 to 140. Laurie Patel monitors her blood pressure with a cuff but acknowledges not checking it as often as Laurie Patel should. Laurie Patel is currently taking lisinopril  10 mg and metoprolol  12.5 mg daily.  Laurie Patel reports a history of swelling in her legs, particularly after prolonged standing, such as when removing wallpaper for five hours. This swelling is associated with a past tick bite 65 years ago, which caused significant swelling and a rash that recurred for several years. Recently, after standing for extended periods, Laurie Patel noticed similar swelling and rash recurrence. Laurie Patel recalls a previous emergency room visit where Laurie Patel was prescribed a diuretic that effectively reduced the swelling.  Laurie Patel manages her blood sugar levels, which Laurie Patel monitors with a device given by her daughter, and reports a recent reading of 41. Laurie Patel experiences occasional hypoglycemic symptoms, which resolve with eating.  Laurie Patel has a history of atrial fibrillation and is on metoprolol  and aspirin  but not on anticoagulants due to a high risk of falls. No chest pain, shortness of breath, or dizziness. Laurie Patel maintains an active lifestyle, engaging in activities such as computer work, sewing, and puzzles, and stays hydrated by  drinking water throughout the day.      Social History   Tobacco Use  Smoking Status Never  Smokeless Tobacco Never    Current Outpatient Medications on File Prior to Visit  Medication Sig Dispense Refill   albuterol  (VENTOLIN  HFA) 108 (90 Base) MCG/ACT inhaler Inhale 2 puffs into the lungs every 4 (four) hours as needed for wheezing or shortness of breath. 17 g 0   ASPIRIN  81 PO Take 81 mg by mouth daily.     cholecalciferol  (VITAMIN D3) 25 MCG (1000 UNIT) tablet Take 1,000 Units by mouth daily.     Elastic Bandages & Supports (WRIST SPLINT/COCK-UP/RIGHT M) MISC Apply nightly. 1 each 0   lisinopril  (ZESTRIL ) 10 MG tablet Take 1 tablet (10 mg total) by mouth daily. 90 tablet 3   metoprolol  succinate (TOPROL -XL) 25 MG 24 hr tablet TAKE A HALF TABLET BY MOUTH DAILY 45 tablet 2   Multiple Vitamins-Calcium (ONE-A-DAY WOMENS PO) Take 1 tablet by mouth daily.     No current facility-administered medications on file prior to visit.     ROS see history of present illness  Objective  Physical Exam Vitals:   02/24/24 0936 02/24/24 1003  BP: (!) 158/66 (!) 160/62  Pulse: 73   Temp: 98.1 F (36.7 C)   SpO2: 92%     BP Readings from Last 3 Encounters:  02/24/24 (!) 160/62  11/25/23 (!) 160/68  10/22/23 (!) 138/50   Wt Readings from Last 3 Encounters:  02/24/24 143 lb 6.4 oz (65 kg)  11/25/23 141 lb 3.2 oz (64 kg)  10/22/23 139 lb 9.6 oz (63.3 kg)  Physical Exam Constitutional:      General: Laurie Patel is not in acute distress.    Appearance: Normal appearance.  HENT:     Head: Normocephalic.  Cardiovascular:     Rate and Rhythm: Normal rate and regular rhythm.     Heart sounds: Normal heart sounds.  Pulmonary:     Effort: Pulmonary effort is normal.     Breath sounds: Normal breath sounds.  Musculoskeletal:     Right lower leg: 1+ Pitting Edema present.     Left lower leg: 1+ Pitting Edema present.  Skin:    General: Skin is warm and dry.  Neurological:     General:  No focal deficit present.     Mental Status: Laurie Patel is alert.  Psychiatric:        Mood and Affect: Mood normal.        Behavior: Behavior normal.      Assessment/Plan: Please see individual problem list.  Chronic diastolic CHF (congestive heart failure) (HCC) Assessment & Plan: Swelling is likely due to prolonged standing and fluid retention from CHF. Prescribe Lasix  20 mg once daily for 3 to 5 days, then as needed. Advise leg elevation and use of compression stockings. Monitor for worsening symptoms and report if no improvement. Denies shortness of breath.   Orders: -     Furosemide ; Take 1 tablet (20 mg total) by mouth daily. X 3 days. Then as needed for edema.  Dispense: 30 tablet; Refill: 0  Primary hypertension Assessment & Plan: Hypertension is managed with lisinopril  and metoprolol . Recent readings are 130-140 mmHg. Fluid retention may contribute to elevated blood pressure. Hold off on increasing lisinopril  until after assessing the effect of Lasix . Continue lisinopril  10 mg and metoprolol  12.5 mg daily. Reassess blood pressure in two weeks. Encourage regular home monitoring. Return precautions given to patient.    Controlled type 2 diabetes mellitus without complication, without long-term current use of insulin  (HCC) Assessment & Plan: Blood sugar levels are well-controlled with diet, with the last A1c at 6.8. Occasional hypoglycemia is managed with dietary adjustments. Continue dietary management and monitor blood sugar levels as needed.    Paroxysmal atrial fibrillation (HCC) Assessment & Plan: Managed with aspirin  and metoprolol  12.5 mg daily, with no recent episodes of chest pain or shortness of breath. No anticoagulation is given due to high fall risk. Continue metoprolol  and aspirin . Monitor for new symptoms or changes.      Return in about 2 weeks (around 03/09/2024) for Follow up.   Leron Glance, NP-C [facility]

## 2024-02-25 IMAGING — CR DG CHEST 2V
2 series · 2 of 2 positions shown · non-contrast
Comparison: 07/10/2021

CLINICAL DATA: Shortness of breath.

EXAM:
CHEST - 2 VIEW

[chest pa]
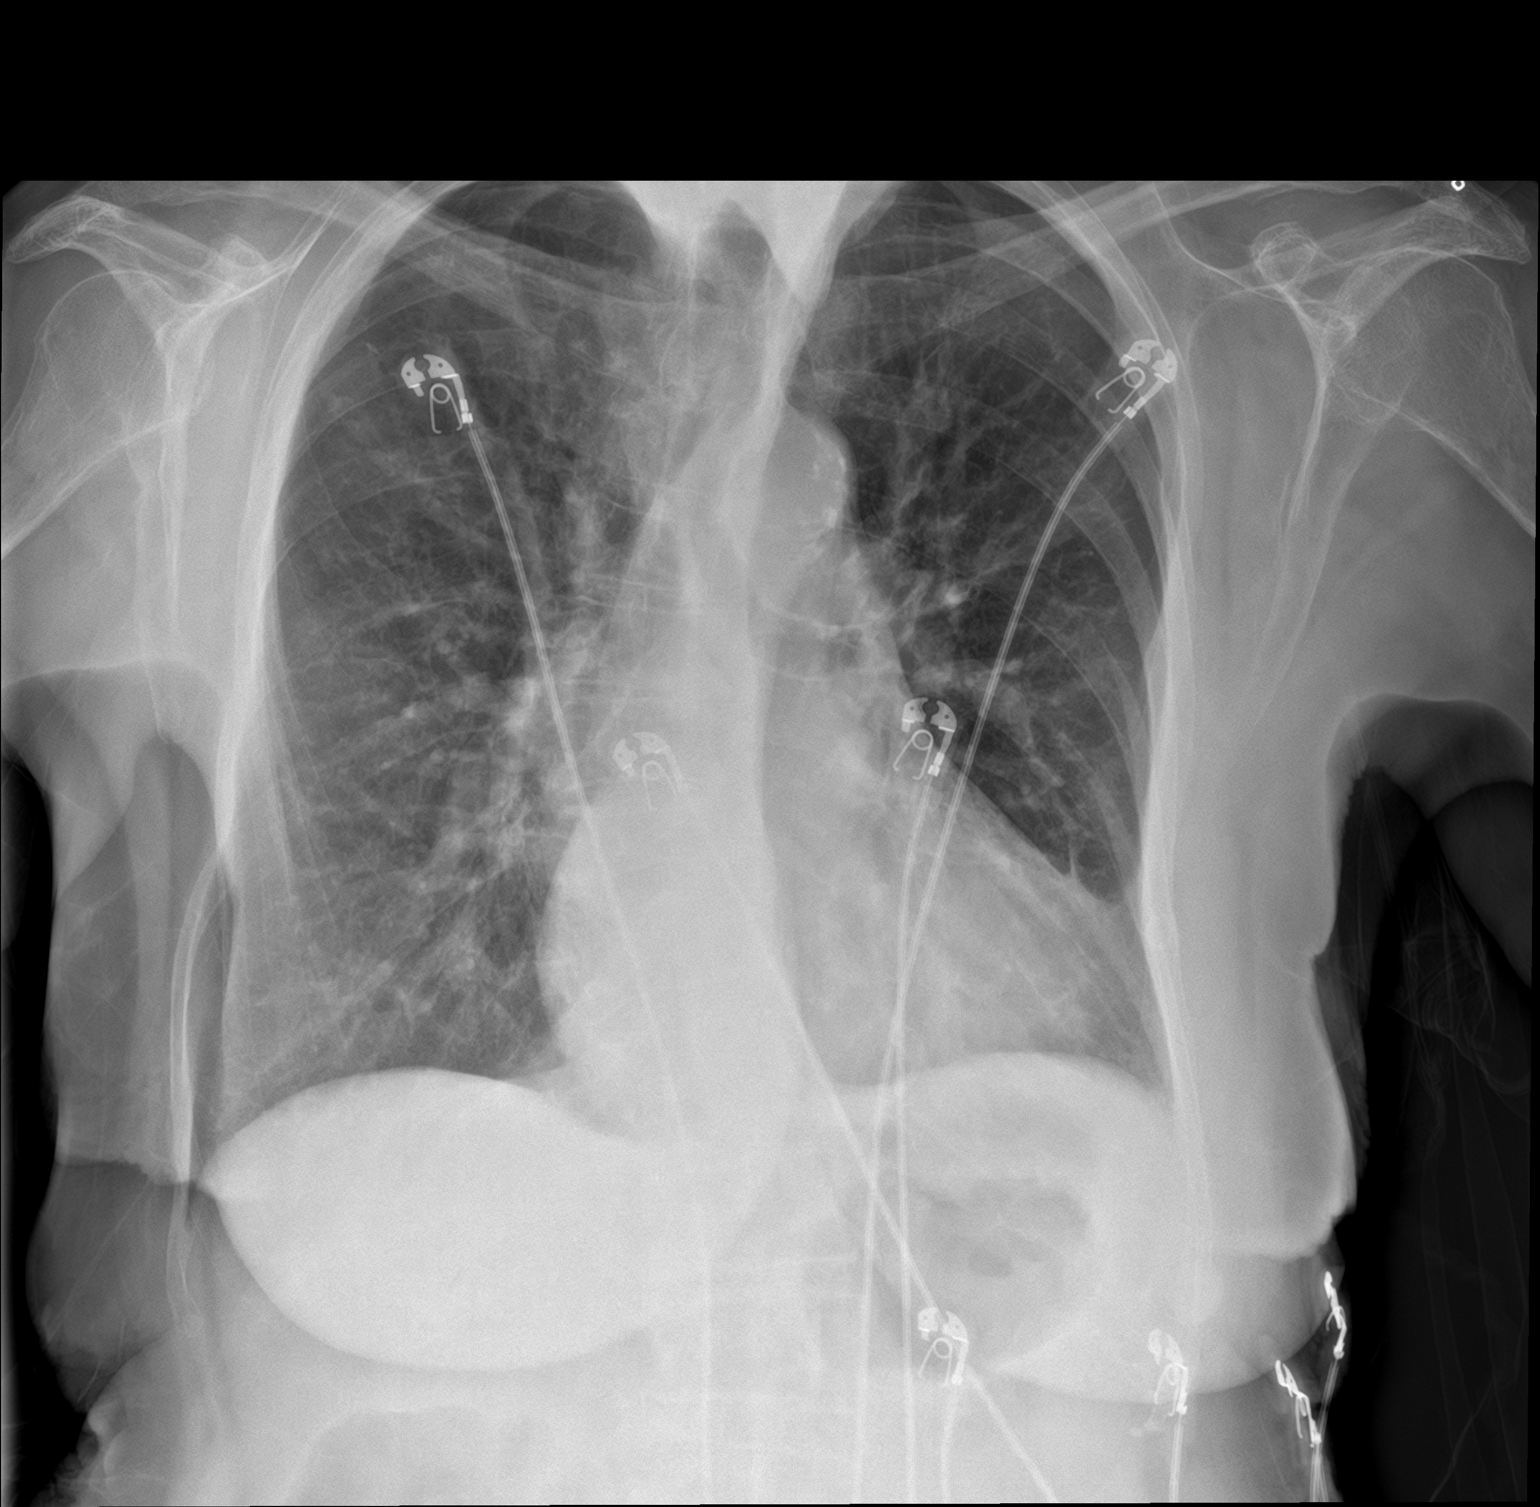

[chest lat]
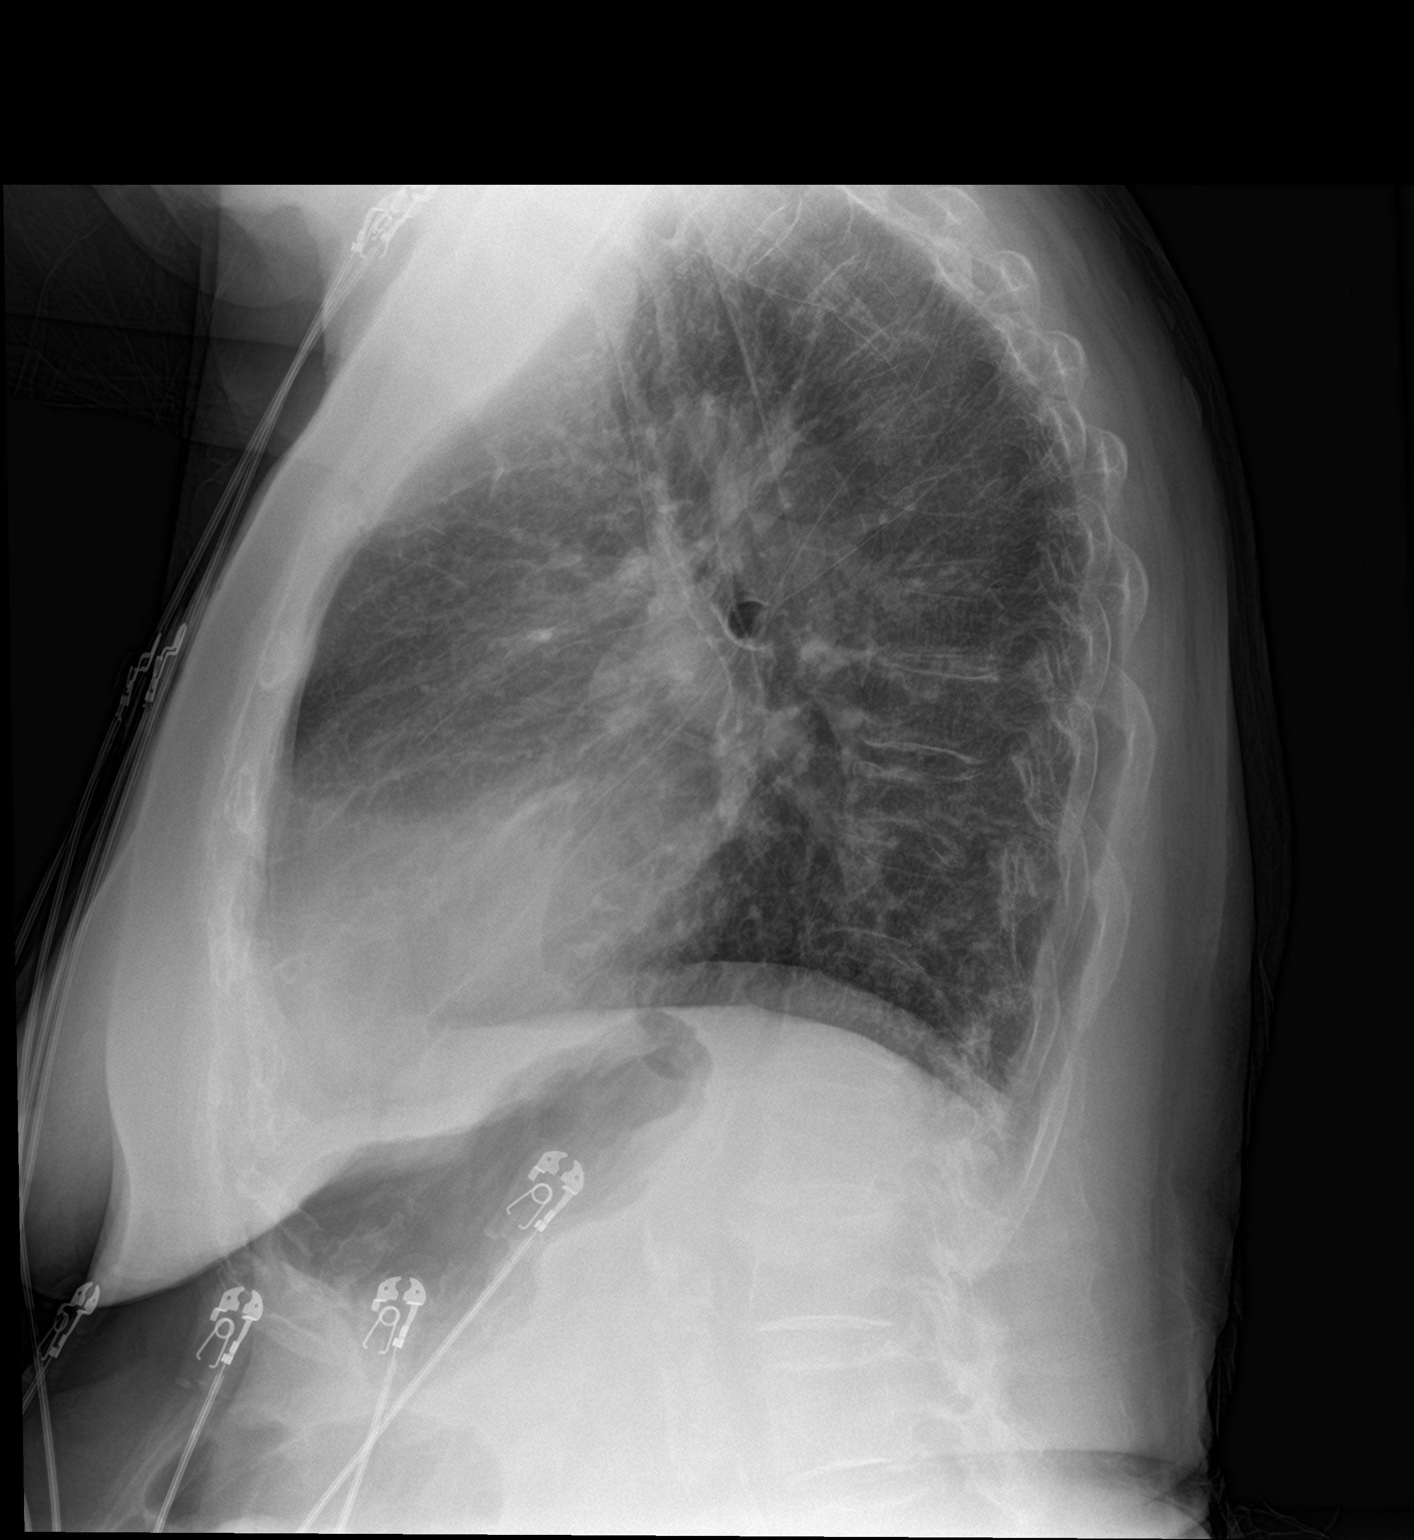

[2 of 2 positions shown; findings below may reference images not displayed]

FINDINGS: Normal heart size. There is diffuse pulmonary vascular congestion.
Blunting of the left costophrenic angle suggestive of tiny left
effusion. No atelectasis, airspace consolidation or pneumothorax.
IMPRESSION: Pulmonary vascular congestion and possible small left pleural
effusion. Correlate for signs or symptoms of CHF.

## 2024-03-09 ENCOUNTER — Ambulatory Visit: Admitting: Nurse Practitioner

## 2024-03-10 ENCOUNTER — Encounter: Payer: Self-pay | Admitting: Nurse Practitioner

## 2024-03-10 ENCOUNTER — Ambulatory Visit: Admitting: Nurse Practitioner

## 2024-03-10 NOTE — Assessment & Plan Note (Signed)
 Hypertension is managed with lisinopril  and metoprolol . Recent readings are 130-140 mmHg. Fluid retention may contribute to elevated blood pressure. Hold off on increasing lisinopril  until after assessing the effect of Lasix . Continue lisinopril  10 mg and metoprolol  12.5 mg daily. Reassess blood pressure in two weeks. Encourage regular home monitoring. Return precautions given to patient.

## 2024-03-10 NOTE — Assessment & Plan Note (Signed)
 Blood sugar levels are well-controlled with diet, with the last A1c at 6.8. Occasional hypoglycemia is managed with dietary adjustments. Continue dietary management and monitor blood sugar levels as needed.

## 2024-03-10 NOTE — Assessment & Plan Note (Signed)
 Managed with aspirin  and metoprolol  12.5 mg daily, with no recent episodes of chest pain or shortness of breath. No anticoagulation is given due to high fall risk. Continue metoprolol  and aspirin . Monitor for new symptoms or changes.

## 2024-03-10 NOTE — Assessment & Plan Note (Addendum)
 Swelling is likely due to prolonged standing and fluid retention from CHF. Prescribe Lasix  20 mg once daily for 3 to 5 days, then as needed. Advise leg elevation and use of compression stockings. Monitor for worsening symptoms and report if no improvement. Denies shortness of breath.

## 2024-03-11 ENCOUNTER — Ambulatory Visit (INDEPENDENT_AMBULATORY_CARE_PROVIDER_SITE_OTHER): Admitting: Nurse Practitioner

## 2024-03-11 VITALS — BP 160/70 | HR 75 | Temp 98.2°F | Ht 65.0 in | Wt 141.2 lb

## 2024-03-11 DIAGNOSIS — I10 Essential (primary) hypertension: Secondary | ICD-10-CM | POA: Diagnosis not present

## 2024-03-11 DIAGNOSIS — I5032 Chronic diastolic (congestive) heart failure: Secondary | ICD-10-CM | POA: Diagnosis not present

## 2024-03-11 DIAGNOSIS — S90811A Abrasion, right foot, initial encounter: Secondary | ICD-10-CM | POA: Diagnosis not present

## 2024-03-11 MED ORDER — LISINOPRIL 20 MG PO TABS: 20.0000 mg | ORAL_TABLET | Freq: Every day | ORAL | 3 refills | Status: AC

## 2024-03-11 NOTE — Progress Notes (Signed)
 Leron Glance, NP-C Phone: [phone]  Laurie Patel is a 89 y.o. female who presents today for follow up.   Discussed the use of AI scribe software for clinical note transcription with the patient, who gave verbal consent to proceed.  History of Present Illness   MARCEDES TECH is a 89 year old female who presents for a two-week follow-up and prescription for the COVID shot.  She attempted to get the COVID shot at [employer] but was informed she needed a prescription. She and her daughter received their flu shots there approximately two weeks ago.  She reports severe pain in her heel that began on Tuesday morning before daylight, accompanied by intermittent bleeding. The pain made ambulation difficult, and she noticed a trail of blood from her bed to the bathroom. She suspects she may have scratched it while walking barefoot and has applied foot cream and worn a sock to bed, which seems to have helped.  She has a history of swelling, which has improved significantly. She took Lasix  for three days, starting on a Tuesday after grocery shopping, and the swelling resolved by Friday. She has enough Lasix  to use as needed.  She monitors her blood pressure at home but is unsure of the accuracy of her machine. Her blood pressure was 171/71 after activity but decreased to 150/70 after resting. She takes lisinopril  10 mg and metoprolol  12.5 mg. She notes that her blood pressure fluctuates with activity and stress.  No chest pain, shortness of breath, or dizziness, although she notes she can make herself dizzy if she hurries.      Social History   Tobacco Use  Smoking Status Never  Smokeless Tobacco Never    Current Outpatient Medications on File Prior to Visit  Medication Sig Dispense Refill   albuterol  (VENTOLIN  HFA) 108 (90 Base) MCG/ACT inhaler Inhale 2 puffs into the lungs every 4 (four) hours as needed for wheezing or shortness of breath. 17 g 0   ASPIRIN  81 PO Take 81 mg by mouth  daily.     cholecalciferol  (VITAMIN D3) 25 MCG (1000 UNIT) tablet Take 1,000 Units by mouth daily.     Elastic Bandages & Supports (WRIST SPLINT/COCK-UP/RIGHT M) MISC Apply nightly. 1 each 0   furosemide  (LASIX ) 20 MG tablet Take 1 tablet (20 mg total) by mouth daily. X 3 days. Then as needed for edema. 30 tablet 0   metoprolol  succinate (TOPROL -XL) 25 MG 24 hr tablet TAKE A HALF TABLET BY MOUTH DAILY 45 tablet 2   Multiple Vitamins-Calcium (ONE-A-DAY WOMENS PO) Take 1 tablet by mouth daily.     No current facility-administered medications on file prior to visit.     ROS see history of present illness  Objective  Physical Exam Vitals:   03/11/24 1508 03/11/24 1532  BP: (!) 168/64 (!) 160/70  Pulse: 75   Temp: 98.2 F (36.8 C)   SpO2: 90%     BP Readings from Last 3 Encounters:  03/11/24 (!) 160/70  02/24/24 (!) 160/62  11/25/23 (!) 160/68   Wt Readings from Last 3 Encounters:  03/11/24 141 lb 3.2 oz (64 kg)  02/24/24 143 lb 6.4 oz (65 kg)  11/25/23 141 lb 3.2 oz (64 kg)    Physical Exam Constitutional:      General: She is not in acute distress.    Appearance: Normal appearance.  HENT:     Head: Normocephalic.  Cardiovascular:     Rate and Rhythm: Normal rate and regular rhythm.  Heart sounds: Normal heart sounds.  Pulmonary:     Effort: Pulmonary effort is normal.     Breath sounds: Normal breath sounds.  Musculoskeletal:     Right lower leg: No edema.     Left lower leg: No edema.       Feet:  Feet:     Right foot:     Skin integrity: Skin breakdown (small abrasion on bottom of foot) and dry skin present.  Skin:    General: Skin is warm and dry.  Neurological:     General: No focal deficit present.     Mental Status: She is alert.  Psychiatric:        Mood and Affect: Mood normal.        Behavior: Behavior normal.      Assessment/Plan: Please see individual problem list.  Primary hypertension Assessment & Plan: Blood pressure remains  elevated today x 2 readings. Increase lisinopril  from 10 mg to 20 mg daily by taking two 10 mg tablets until current supply is exhausted. New prescription for 20 mg sent to pharmacy. Continue Metoprolol  12.5 mg daily. Monitor blood pressure at home regularly. Check BMP. Follow-up in one month to reassess control.  Orders: -     Basic metabolic panel with GFR -     Lisinopril ; Take 1 tablet (20 mg total) by mouth daily.  Dispense: 90 tablet; Refill: 3  Chronic diastolic CHF (congestive heart failure) (HCC) Assessment & Plan: Edema has improved with Lasix  and is currently well-managed. Continue using Lasix  as needed. Advise leg elevation and use of compression stockings. Monitor for worsening symptoms.   Abrasion of right foot, initial encounter Assessment & Plan: The minor superficial abrasion on the right foot has improved with foot cream and a sock. Continue applying foot cream and wear a sock to protect the area and promote healing. Return precautions given to patient.       Return in about 4 weeks (around 04/08/2024) for Follow up.   Leron Glance, NP-C [facility]

## 2024-03-25 ENCOUNTER — Encounter: Payer: Self-pay | Admitting: Nurse Practitioner

## 2024-03-25 DIAGNOSIS — S90811A Abrasion, right foot, initial encounter: Secondary | ICD-10-CM | POA: Insufficient documentation

## 2024-03-25 NOTE — Assessment & Plan Note (Addendum)
 Blood pressure remains elevated today x 2 readings. Increase lisinopril  from 10 mg to 20 mg daily by taking two 10 mg tablets until current supply is exhausted. New prescription for 20 mg sent to pharmacy. Continue Metoprolol  12.5 mg daily. Monitor blood pressure at home regularly. Check BMP. Follow-up in one month to reassess control.

## 2024-03-25 NOTE — Assessment & Plan Note (Signed)
 The minor superficial abrasion on the right foot has improved with foot cream and a sock. Continue applying foot cream and wear a sock to protect the area and promote healing. Return precautions given to patient.

## 2024-03-25 NOTE — Assessment & Plan Note (Signed)
 Edema has improved with Lasix  and is currently well-managed. Continue using Lasix  as needed. Advise leg elevation and use of compression stockings. Monitor for worsening symptoms.

## 2024-04-07 ENCOUNTER — Encounter: Payer: Self-pay | Admitting: Nurse Practitioner

## 2024-04-07 ENCOUNTER — Ambulatory Visit: Admitting: Nurse Practitioner

## 2024-04-07 VITALS — BP 170/72 | HR 73 | Temp 98.1°F | Ht 65.0 in | Wt 141.2 lb

## 2024-04-07 DIAGNOSIS — I10 Essential (primary) hypertension: Secondary | ICD-10-CM

## 2024-04-07 NOTE — Assessment & Plan Note (Signed)
 Essential hypertension with a recent increase in lisinopril  dosage. Home blood pressure readings are acceptable at 140 mmHg systolic, but in-office reading is elevated at 170 mmHg systolic, likely due to activity and white coat syndrome. Mild improvement noted on recheck. No concerning symptoms reported. Continue lisinopril  and Toprol  XL. Check BMP. Continue home monitoring. Return precautions given to patient.

## 2024-04-07 NOTE — Progress Notes (Signed)
 Leron Glance, NP-C Phone: [phone]  Laurie Patel is a 89 y.o. female who presents today for follow up.   Discussed the use of AI scribe software for clinical note transcription with the patient, who gave verbal consent to proceed.  History of Present Illness   Laurie Patel is a 89 year old female with hypertension who presents for blood pressure management.  She feels better since her blood pressure medication was increased, noting improved energy levels and a return to cooking after a period of inactivity over the summer. She describes herself as feeling 'very good' and has no issues with sleep.  No chest pain or shortness of breath. She mentions some swelling but does not feel very swollen today and does not take her diuretic (Lasix ) regularly. She confirms checking her blood pressure at home, with the highest reading being 140 mmHg systolic. Her blood pressure tends to be higher during medical visits, which she attributes to stress.  She has been busy cleaning her house in preparation for a visit from her daughter, which involved significant physical activity, including cleaning up after her three dogs. She is currently taking lisinopril  and Toprol  XL daily for her hypertension. She recalls a previous cardiology visit where her blood pressure was recorded as 160 mmHg systolic.  She lives with three dogs, including a Husky, a [place], and a [employer]. She enjoys cooking and has been more active recently, cleaning her home in preparation for a family visit.      Social History   Tobacco Use  Smoking Status Never  Smokeless Tobacco Never    Current Outpatient Medications on File Prior to Visit  Medication Sig Dispense Refill   albuterol  (VENTOLIN  HFA) 108 (90 Base) MCG/ACT inhaler Inhale 2 puffs into the lungs every 4 (four) hours as needed for wheezing or shortness of breath. 17 g 0   ASPIRIN  81 PO Take 81 mg by mouth daily.     cholecalciferol  (VITAMIN  D3) 25 MCG (1000 UNIT) tablet Take 1,000 Units by mouth daily.     Elastic Bandages & Supports (WRIST SPLINT/COCK-UP/RIGHT M) MISC Apply nightly. 1 each 0   furosemide  (LASIX ) 20 MG tablet Take 1 tablet (20 mg total) by mouth daily. X 3 days. Then as needed for edema. 30 tablet 0   lisinopril  (ZESTRIL ) 20 MG tablet Take 1 tablet (20 mg total) by mouth daily. 90 tablet 3   metoprolol  succinate (TOPROL -XL) 25 MG 24 hr tablet TAKE A HALF TABLET BY MOUTH DAILY 45 tablet 2   Multiple Vitamins-Calcium (ONE-A-DAY WOMENS PO) Take 1 tablet by mouth daily.     No current facility-administered medications on file prior to visit.     ROS see history of present illness  Objective  Physical Exam Vitals:   04/07/24 1612 04/07/24 1613  BP: (!) 178/90 (!) 170/72  Pulse: 73   Temp: 98.1 F (36.7 C)   SpO2: 93%     BP Readings from Last 3 Encounters:  04/07/24 (!) 170/72  03/11/24 (!) 160/70  02/24/24 (!) 160/62   Wt Readings from Last 3 Encounters:  04/07/24 141 lb 3.2 oz (64 kg)  03/11/24 141 lb 3.2 oz (64 kg)  02/24/24 143 lb 6.4 oz (65 kg)    Physical Exam Constitutional:      General: She is not in acute distress.    Appearance: Normal appearance.  HENT:     Head: Normocephalic.  Cardiovascular:     Rate and Rhythm: Normal rate  and regular rhythm.     Heart sounds: Normal heart sounds.  Pulmonary:     Effort: Pulmonary effort is normal.     Breath sounds: Normal breath sounds.  Skin:    General: Skin is warm and dry.  Neurological:     General: No focal deficit present.     Mental Status: She is alert.  Psychiatric:        Mood and Affect: Mood normal.        Behavior: Behavior normal.      Assessment/Plan: Please see individual problem list.  Primary hypertension Assessment & Plan: Essential hypertension with a recent increase in lisinopril  dosage. Home blood pressure readings are acceptable at 140 mmHg systolic, but in-office reading is elevated at 170 mmHg  systolic, likely due to activity and white coat syndrome. Mild improvement noted on recheck. No concerning symptoms reported. Continue lisinopril  and Toprol  XL. Check BMP. Continue home monitoring. Return precautions given to patient.    Orders: -     Basic metabolic panel with GFR      Return in about 3 months (around 07/08/2024) for Follow up.   Leron Glance, NP-C [facility]

## 2024-04-08 ENCOUNTER — Ambulatory Visit: Payer: Self-pay | Admitting: Nurse Practitioner

## 2024-04-08 LAB — BASIC METABOLIC PANEL WITH GFR
BUN: 13 mg/dL (ref 6–23)
CO2: 30 meq/L (ref 19–32)
Calcium: 9.7 mg/dL (ref 8.4–10.5)
Chloride: 99 meq/L (ref 96–112)
Creatinine, Ser: 0.69 mg/dL (ref 0.40–1.20)
GFR: 74.19 mL/min (ref >60.00)
Glucose, Bld: 126 mg/dL — ABNORMAL HIGH (ref 70–99)
Potassium: 4.1 meq/L (ref 3.5–5.1)
Sodium: 139 meq/L (ref 135–145)

## 2024-04-22 ENCOUNTER — Ambulatory Visit: Admitting: Nurse Practitioner

## 2024-04-26 ENCOUNTER — Ambulatory Visit: Admitting: Podiatry

## 2024-04-26 DIAGNOSIS — E1151 Type 2 diabetes mellitus with diabetic peripheral angiopathy without gangrene: Secondary | ICD-10-CM | POA: Diagnosis not present

## 2024-04-26 DIAGNOSIS — B351 Tinea unguium: Secondary | ICD-10-CM

## 2024-04-26 DIAGNOSIS — M79676 Pain in unspecified toe(s): Secondary | ICD-10-CM | POA: Diagnosis not present

## 2024-04-26 DIAGNOSIS — L853 Xerosis cutis: Secondary | ICD-10-CM

## 2024-05-02 ENCOUNTER — Encounter: Payer: Self-pay | Admitting: Podiatry

## 2024-05-02 NOTE — Progress Notes (Signed)
  Subjective:  Patient ID: Laurie Patel, female    DOB: [birth year],  MRN: [id number]  Laurie Patel presents to clinic today for at risk foot care. Pt has h/o NIDDM with PAD and painful elongated mycotic toenails 1-5 bilaterally which are tender when wearing enclosed shoe gear. Pain is relieved with periodic professional debridement. Patient is concerned about dry skin of both feet. Chief Complaint  Patient presents with   Toe Pain    A1c 7.3 but she denies being diabetic. NP Gretel is her PCP and she has an appointment next week   New problem(s): None.   PCP is Gretel App, NP. Laurie Patel 04/07/2024.  Allergies  Allergen Reactions   Augmentin  [Amoxicillin -Pot Clavulanate] Nausea And Vomiting   Prednisone     Other Reaction(s): Not available    Review of Systems: Negative except as noted in the HPI.  Objective: No changes noted in today's physical examination. There were no vitals filed for this visit. Laurie Patel is a pleasant 89 y.o. female in NAD. AAO x 3.  Vascular Examination: CFT <3 seconds b/l. DP/PT pulses faintly palpable b/l. Pedal edema absent. Skin temperature gradient warm to warm b/l. Digital hair diminished. No pain with calf compression. No ischemia or gangrene. No cyanosis or clubbing noted b/l.    Neurological Examination: Sensation grossly intact b/l with 10 gram monofilament. Vibratory sensation intact b/l.   Dermatological Examination: Pedal skin warm and supple b/l. Pedal skin noted to be dry b/l feet.  No open wounds. No interdigital macerations.  Toenails 1-5 b/l thick, discolored, elongated with subungual debris and pain on dorsal palpation.    No hyperkeratotic nor porokeratotic lesions.  Musculoskeletal Examination: Normal muscle strength 5/5 to all lower extremity muscle groups bilaterally. No pain, crepitus or joint limitation noted with ROM b/l LE. No gross bony pedal deformities b/l. Patient ambulates with rollator assistance.  Radiographs:  None  Assessment/Plan: 1. Pain due to onychomycosis of toenail   2. Xerosis cutis   3. Type II diabetes mellitus with peripheral circulatory disorder [facility])   Patient was evaluated and treated. All patient's and/or POA's questions/concerns addressed on today's visit. Mycotic toenails 1-5 b/l debrided in length and girth without incident.  Continue daily foot inspections and monitor blood glucose per PCP/Endocrinologist's recommendations.Continue soft, supportive shoe gear daily. Report any pedal injuries to medical professional. Call office if there are any quesitons/concerns. -For dry skin, recommended daily use of Bag Balm Hand and Body Moistuizer which may be purchased at local drug store or on [employer]. -Patient/POA to call should there be question/concern in the interim.   No follow-ups on file.  Laurie Patel, DPM      [place] LOCATION: [address] [place] [address]                   Office [phone]   [facility] LOCATION: [address] [place], [place] [address] Office [phone]

## 2024-05-26 ENCOUNTER — Encounter: Payer: Self-pay | Admitting: Cardiology

## 2024-05-26 ENCOUNTER — Ambulatory Visit: Attending: Cardiology | Admitting: Cardiology

## 2024-05-26 VITALS — BP 160/60 | HR 85 | Ht 60.0 in | Wt 146.8 lb

## 2024-05-26 DIAGNOSIS — I10 Essential (primary) hypertension: Secondary | ICD-10-CM

## 2024-05-26 DIAGNOSIS — I517 Cardiomegaly: Secondary | ICD-10-CM

## 2024-05-26 DIAGNOSIS — I480 Paroxysmal atrial fibrillation: Secondary | ICD-10-CM | POA: Diagnosis not present

## 2024-05-26 MED ORDER — METOPROLOL SUCCINATE ER 25 MG PO TB24: 25.0000 mg | ORAL_TABLET | Freq: Every day | ORAL | 3 refills | Status: AC

## 2024-05-26 MED ORDER — ALBUTEROL SULFATE HFA 108 (90 BASE) MCG/ACT IN AERS: 2.0000 | INHALATION_SPRAY | RESPIRATORY_TRACT | 0 refills | Status: AC | PRN

## 2024-05-26 NOTE — Patient Instructions (Signed)
 Medication Instructions:  - INCREASE metoprolol  two 25 mg daily  *If you need a refill on your cardiac medications before your next appointment, please call your pharmacy*  Lab Work: No labs ordered today  If you have labs (blood work) drawn today and your tests are completely normal, you will receive your results only by: MyChart Message (if you have MyChart) OR A paper copy in the mail If you have any lab test that is abnormal or we need to change your treatment, we will call you to review the results.  Testing/Procedures: No test ordered today   Follow-Up: At [facility], you and your health needs are our priority.  As part of our continuing mission to provide you with exceptional heart care, our providers are all part of one team.  This team includes your primary Cardiologist (physician) and Advanced Practice Providers or APPs (Physician Assistants and Nurse Practitioners) who all work together to provide you with the care you need, when you need it.  Your next appointment:   1 year(s)  Provider:   You may see Redell Cave, MD or one of the following Advanced Practice Providers on your designated Care Team:   Lonni Meager, NP Lesley Maffucci, PA-C Bernardino Bring, PA-C Cadence [place], PA-C Tylene Lunch, NP Barnie Hila, NP    We recommend signing up for the patient portal called MyChart.  Sign up information is provided on this After Visit Summary.  MyChart is used to connect with patients for Virtual Visits (Telemedicine).  Patients are able to view lab/test results, encounter notes, upcoming appointments, etc.  Non-urgent messages can be sent to your provider as well.   To learn more about what you can do with MyChart, go to [email or web address].

## 2024-05-26 NOTE — Progress Notes (Signed)
 Cardiology Office Note:    Date:  05/26/2024   ID:  Laurie Patel, DOB [birth year], MRN [id number]  PCP:  Gretel App, NP   [facility] Providers Cardiologist:  Redell Cave, MD     Referring MD: Gretel App, NP   Chief Complaint  Patient presents with   Follow-up    6 month follow up pt has been doing well with no complaints of chest pain, chest pressure or SOB, medciation reviewed verbally with patient    History of Present Illness:    Laurie Patel is a 89 y.o. female with a hx of HFpEF, hypertension, paroxysmal atrial fibrillation, whitecoat syndrome, diabetes, LVH who presents for follow-up.    Doing okay, denies chest pain or shortness of breath, having some difficulty with cutting Toprol -XL.  Wants to take a whole pill.  BP at home usually ranges in the 130s to 140s systolic.  Denies chest pain, shortness of breath.  Denies dizziness, palpitation or falls.  Prior notes  Echo 01/2022 EF 60 to 65%. echocardiogram 01/2021 showing preserved ejection fraction, asymmetric septal hypertrophy up to 1.5 cm, no LVOT obstruction, mild aortic valve sclerosis. No anticoagulation due to fall risk, history of anemia    Past Medical History:  Diagnosis Date   Ankle fracture 10/14/2017   Chronic heart failure with preserved ejection fraction (HFpEF) (HCC)    Heart murmur    Hypertension    LBBB (left bundle branch block)    LVH (left ventricular hypertrophy)    a. 06/2016 Echo: EF 60-65%, no rwma, mild MR. PASP ; b. 01/2021 Echo: EF 55-60%, sev assym septal hypertrophy (LV basal-mid septum up to 1.5cm). No SAM. No rwma. GrIDD. Nl RV fxn. Mild Ao sclerosis.   Palpitations    a. 07/2016 Holter: Avg HR 77 (54-117). 59 isolated PVCs. Rare PACs (<1%) w/ 3 atrial runs - longest 7 beats, fastest 170 bpm x 4 beats - PAT.   PNA (pneumonia) 02/16/2018    Past Surgical History:  Procedure Laterality Date   APPENDECTOMY  1986   EYE SURGERY Bilateral 1995 and 1999   Lens  implants   INTRAMEDULLARY (IM) NAIL INTERTROCHANTERIC Left 02/02/2022   Procedure: INTRAMEDULLARY (IM) NAIL INTERTROCHANTERIC;  Surgeon: Marchia Drivers, MD;  Location: [facility] ORS;  Service: Orthopedics;  Laterality: Left;   WRIST SURGERY Left 1986   ORIF     Current Medications: Current Meds  Medication Sig   ASPIRIN  81 PO Take 81 mg by mouth daily.   cholecalciferol  (VITAMIN D3) 25 MCG (1000 UNIT) tablet Take 1,000 Units by mouth daily.   Elastic Bandages & Supports (WRIST SPLINT/COCK-UP/RIGHT M) MISC Apply nightly.   furosemide  (LASIX ) 20 MG tablet Take 1 tablet (20 mg total) by mouth daily. X 3 days. Then as needed for edema.   lisinopril  (ZESTRIL ) 20 MG tablet Take 1 tablet (20 mg total) by mouth daily.   Multiple Vitamins-Calcium (ONE-A-DAY WOMENS PO) Take 1 tablet by mouth daily.   [DISCONTINUED] albuterol  (VENTOLIN  HFA) 108 (90 Base) MCG/ACT inhaler Inhale 2 puffs into the lungs every 4 (four) hours as needed for wheezing or shortness of breath.   [DISCONTINUED] metoprolol  succinate (TOPROL -XL) 25 MG 24 hr tablet TAKE A HALF TABLET BY MOUTH DAILY     Allergies:   Augmentin  [amoxicillin -pot clavulanate] and Prednisone   Social History   Socioeconomic History   Marital status: Widowed    Spouse name: Not on file   Number of children: 5   Years of education: 1 year of  college.   Highest education level: 12th grade  Occupational History   Not on file  Tobacco Use   Smoking status: Never   Smokeless tobacco: Never  Vaping Use   Vaping status: Never Used  Substance and Sexual Activity   Alcohol use: No    Alcohol/week: 0.0 standard drinks of alcohol   Drug use: No   Sexual activity: Not Currently  Other Topics Concern   Not on file  Social History Narrative   Lives in [place]    Owns a [employer]    1 dog   Social Drivers of Health   Financial Resource Strain: Low Risk  (09/10/2023)   Overall Financial Resource Strain (CARDIA)    Difficulty of Paying Living  Expenses: Not hard at all  Food Insecurity: No Food Insecurity (09/10/2023)   Hunger Vital Sign    Worried About Running Out of Food in the Last Year: Never true    Ran Out of Food in the Last Year: Never true  Transportation Needs: No Transportation Needs (09/10/2023)   PRAPARE - Administrator, Civil Service (Medical): No    Lack of Transportation (Non-Medical): No  Physical Activity: Inactive (09/10/2023)   Exercise Vital Sign    Days of Exercise per Week: 0 days    Minutes of Exercise per Session: 0 min  Stress: No Stress Concern Present (09/10/2023)   [employer] of Occupational Health - Occupational Stress Questionnaire    Feeling of Stress : Not at all  Social Connections: Moderately Integrated (09/10/2023)   Social Connection and Isolation Panel    Frequency of Communication with Friends and Family: Once a week    Frequency of Social Gatherings with Friends and Family: More than three times a week    Attends Religious Services: More than 4 times per year    Active Member of [employer] or Organizations: Yes    Attends Banker Meetings: More than 4 times per year    Marital Status: Widowed     Family History: The patient's family history includes COPD (age of onset: 56) in her father; Cancer (age of onset: 37) in her mother; Early death in her paternal grandmother; Heart disease in her maternal grandfather, maternal grandmother, and paternal grandfather; Heart disease (age of onset: 52) in her brother; Heart disease (age of onset: 68) in her father; Obesity in her paternal grandfather.  ROS:   Please see the history of present illness.     All other systems reviewed and are negative.  EKGs/Labs/Other Studies Reviewed:    The following studies were reviewed today:       Recent Labs: 10/22/2023: ALT 13; Hemoglobin 14.8; Platelets 244.0 04/07/2024: BUN 13; Creatinine, Ser 0.69; Potassium 4.1; Sodium 139  Recent Lipid Panel    Component Value  Date/Time   CHOL 173 03/14/2020 0941   TRIG 117.0 03/14/2020 0941   HDL 36.20 (L) 03/14/2020 0941   CHOLHDL 5 03/14/2020 0941   VLDL 23.4 03/14/2020 0941   LDLCALC 114 (H) 03/14/2020 0941   LDLDIRECT 147.0 03/21/2016 0926     Risk Assessment/Calculations:          Physical Exam:    VS:  BP (!) 160/60 (BP Location: Left Arm, Patient Position: Sitting) Comment: took meds this am  Pulse 85   Ht 5' (1.524 m)   Wt 146 lb 12.8 oz (66.6 kg)   SpO2 98%   BMI 28.67 kg/m     Wt Readings from Last 3 Encounters:  05/26/24 146 lb 12.8 oz (66.6 kg)  04/07/24 141 lb 3.2 oz (64 kg)  03/11/24 141 lb 3.2 oz (64 kg)     GEN:  Well nourished, well developed in no acute distress HEENT: Normal NECK: No JVD; No carotid bruits CARDIAC: RRR, faint systolic murmur RESPIRATORY: Mild expiratory wheezing. ABDOMEN: Soft, non-tender, non-distended MUSCULOSKELETAL:  No edema; No deformity  SKIN: Warm and dry NEUROLOGIC:  Alert and oriented x 3 PSYCHIATRIC:  Normal affect   ASSESSMENT:    1. LVH (left ventricular hypertrophy)   2. White coat syndrome with hypertension   3. Paroxysmal atrial fibrillation (HCC)    PLAN:    In order of problems listed above:  HFpEF, moderate asymmetric LVH, no plans for CMR, patient is claustrophobic.  Appears euvolemic, denies shortness of breath.  Continue Toprol -XL 25 mg daily, Lasix  20 mg daily as needed. Hypertension with whitecoat syndrome.  Okay to increase Toprol -XL to 25 mg daily.  Continue lisinopril  10 mg daily. Paroxysmal atrial fibrillation, continue aspirin , Toprol -XL 25 mg daily.  Not on anticoagulation due to fall risk.  Follow-up in 12 months.    Medication Adjustments/Labs and Tests Ordered: Current medicines are reviewed at length with the patient today.  Concerns regarding medicines are outlined above.  No orders of the defined types were placed in this encounter.   Meds ordered this encounter  Medications   albuterol  (VENTOLIN  HFA)  108 (90 Base) MCG/ACT inhaler    Sig: Inhale 2 puffs into the lungs every 4 (four) hours as needed for wheezing or shortness of breath.    Dispense:  17 g    Refill:  0   metoprolol  succinate (TOPROL -XL) 25 MG 24 hr tablet    Sig: Take 1 tablet (25 mg total) by mouth daily.    Dispense:  90 tablet    Refill:  3     Patient Instructions  Medication Instructions:  - INCREASE metoprolol  two 25 mg daily  *If you need a refill on your cardiac medications before your next appointment, please call your pharmacy*  Lab Work: No labs ordered today  If you have labs (blood work) drawn today and your tests are completely normal, you will receive your results only by: MyChart Message (if you have MyChart) OR A paper copy in the mail If you have any lab test that is abnormal or we need to change your treatment, we will call you to review the results.  Testing/Procedures: No test ordered today   Follow-Up: At [facility], you and your health needs are our priority.  As part of our continuing mission to provide you with exceptional heart care, our providers are all part of one team.  This team includes your primary Cardiologist (physician) and Advanced Practice Providers or APPs (Physician Assistants and Nurse Practitioners) who all work together to provide you with the care you need, when you need it.  Your next appointment:   1 year(s)  Provider:   You may see Redell Cave, MD or one of the following Advanced Practice Providers on your designated Care Team:   Lonni Meager, NP Lesley Maffucci, PA-C Bernardino Bring, PA-C Cadence Waco, PA-C Tylene Lunch, NP Barnie Hila, NP    We recommend signing up for the patient portal called MyChart.  Sign up information is provided on this After Visit Summary.  MyChart is used to connect with patients for Virtual Visits (Telemedicine).  Patients are able to view lab/test results, encounter notes, upcoming appointments, etc.   Non-urgent  messages can be sent to your provider as well.   To learn more about what you can do with MyChart, go to [email or web address].         Signed, Redell Cave, MD  05/26/2024 10:11 AM    [facility]

## 2024-06-23 LAB — OPHTHALMOLOGY REPORT-SCANNED

## 2024-07-05 ENCOUNTER — Ambulatory Visit: Admitting: Podiatry

## 2024-07-05 DIAGNOSIS — B351 Tinea unguium: Secondary | ICD-10-CM

## 2024-07-05 DIAGNOSIS — M79676 Pain in unspecified toe(s): Secondary | ICD-10-CM | POA: Diagnosis not present

## 2024-07-05 DIAGNOSIS — E1151 Type 2 diabetes mellitus with diabetic peripheral angiopathy without gangrene: Secondary | ICD-10-CM

## 2024-07-12 ENCOUNTER — Encounter: Payer: Self-pay | Admitting: Podiatry

## 2024-07-12 NOTE — Progress Notes (Signed)
"  °  Subjective:  Patient ID: Laurie Patel, female    DOB: 01/15/1928,  MRN: [id number]  Laurie Patel presents to clinic today for at risk foot care. Pt has h/o NIDDM with PAD and painful thick toenails that are difficult to trim. Pain interferes with ambulation. Aggravating factors include wearing enclosed shoe gear. Pain is relieved with periodic professional debridement. Patient has documented diagnosis of diabetes from PCP as controlled type 2 diabetes. Chief Complaint  Patient presents with   Nail Problem    She will see NP Gretel this month. She denies being diabetic   New problem(s): None.   PCP is Gretel App, NP.  Allergies[1]  Review of Systems: Negative except as noted in the HPI.  Objective: No changes noted in today's physical examination. There were no vitals filed for this visit. Laurie Patel is a pleasant 89 y.o. female in NAD. AAO x 3.  Vascular Examination: CFT <3 seconds b/l. DP/PT pulses faintly palpable b/l. Pedal edema absent. Skin temperature gradient warm to warm b/l. Digital hair diminished. No pain with calf compression. No ischemia or gangrene. No cyanosis or clubbing noted b/l.    Neurological Examination: Sensation grossly intact b/l with 10 gram monofilament. Vibratory sensation intact b/l.   Dermatological Examination: Pedal skin warm and supple b/l. Pedal skin noted to be dry b/l feet.  No open wounds. No interdigital macerations.  Toenails 1-5 b/l thick, discolored, elongated with subungual debris and pain on dorsal palpation.    No hyperkeratotic nor porokeratotic lesions.  Musculoskeletal Examination: Normal muscle strength 5/5 to all lower extremity muscle groups bilaterally. No pain, crepitus or joint limitation noted with ROM b/l LE. No gross bony pedal deformities b/l. Patient ambulates with rollator assistance.  Radiographs: None  Assessment/Plan: 1. Pain due to onychomycosis of toenail   2. Type II diabetes mellitus with  peripheral circulatory disorder [facility])   Consent given for treatment. Patient examined. All patient's and/or POA's questions/concerns addressed on today's visit. Toenails 1-5 b/l debrided in length and girth without incident. Continue foot and shoe inspections daily. Monitor blood glucose per PCP/Endocrinologist's recommendations. Continue soft, supportive shoe gear daily. Report any pedal injuries to medical professional. Call office if there are any questions/concerns. -Patient/POA to call should there be question/concern in the interim.   Return in about 9 weeks (around 09/06/2024).  Delon LITTIE Merlin, DPM      [place] LOCATION: [address], [place] [address]                   Office [phone]   [place] LOCATION: [address] [place], [place] [address] Office [phone]     [1]  Allergies Allergen Reactions   Augmentin  [Amoxicillin -Pot Clavulanate] Nausea And Vomiting   Prednisone     Other Reaction(s): Not available   "

## 2024-07-14 ENCOUNTER — Ambulatory Visit: Admitting: Nurse Practitioner

## 2024-07-14 ENCOUNTER — Encounter: Payer: Self-pay | Admitting: Nurse Practitioner

## 2024-07-14 VITALS — BP 170/72 | HR 89 | Temp 97.9°F | Ht 65.0 in | Wt 147.4 lb

## 2024-07-14 DIAGNOSIS — E119 Type 2 diabetes mellitus without complications: Secondary | ICD-10-CM | POA: Diagnosis not present

## 2024-07-14 DIAGNOSIS — I5032 Chronic diastolic (congestive) heart failure: Secondary | ICD-10-CM

## 2024-07-14 DIAGNOSIS — I10 Essential (primary) hypertension: Secondary | ICD-10-CM

## 2024-07-14 DIAGNOSIS — I480 Paroxysmal atrial fibrillation: Secondary | ICD-10-CM | POA: Diagnosis not present

## 2024-07-14 LAB — COMPREHENSIVE METABOLIC PANEL WITH GFR
ALT: 11 U/L (ref 3–35)
AST: 16 U/L (ref 5–37)
Albumin: 4.2 g/dL (ref 3.5–5.2)
Alkaline Phosphatase: 80 U/L (ref 39–117)
BUN: 10 mg/dL (ref 6–23)
CO2: 33 meq/L — ABNORMAL HIGH (ref 19–32)
Calcium: 9.5 mg/dL (ref 8.4–10.5)
Chloride: 99 meq/L (ref 96–112)
Creatinine, Ser: 0.65 mg/dL (ref 0.40–1.20)
GFR: 75.13 mL/min
Glucose, Bld: 205 mg/dL — ABNORMAL HIGH (ref 70–99)
Potassium: 3.9 meq/L (ref 3.5–5.1)
Sodium: 137 meq/L (ref 135–145)
Total Bilirubin: 0.5 mg/dL (ref 0.2–1.2)
Total Protein: 6.2 g/dL (ref 6.0–8.3)

## 2024-07-14 LAB — HEMOGLOBIN A1C: Hgb A1c MFr Bld: 7.3 % — ABNORMAL HIGH (ref 4.6–6.5)

## 2024-07-14 MED ORDER — FUROSEMIDE 20 MG PO TABS: 20.0000 mg | ORAL_TABLET | Freq: Every day | ORAL | 5 refills | Status: AC | PRN

## 2024-07-14 NOTE — Progress Notes (Signed)
 " Leron Glance, NP-C Phone: [phone]  Laurie Patel is a 89 y.o. female who presents today for follow up.   Discussed the use of AI scribe software for clinical note transcription with the patient, who gave verbal consent to proceed.  History of Present Illness   Laurie Patel is a 89 year old female with hypertension and atrial fibrillation who presents for a follow-up visit.  Her blood pressure is usually in the 130s when checked at home, although she does not check it daily. She takes lisinopril  20 mg for her blood pressure.  She experiences swelling in her ankles. She takes Lasix  occasionally as needed for swelling, but it causes significant dryness, including in her sinuses, and contributes to constipation. She has about nine pills left in her current bottle.  She recently saw a cardiologist who increased her metoprolol  dosage. She takes 25 mg of metoprolol  daily for her atrial fibrillation. She recalls an incident where she was given 50 mg twice daily, which caused insomnia, leading to a reduction to 25 mg.  She has a history of diabetes but is not currently on medication for it. She previously tried several medications, including Tradjenta , but stopped due to constipation and lack of perceived benefit. Her last A1c was 7.3, and she is monitoring her diet closely, cooking meals that include vegetables and proteins. She does not check her blood sugar at home due to shaky hands.  She takes aspirin  and is not on any other blood thinners due to an increased risk of falls. Her atrial fibrillation began after a hospital stay for a hip and shoulder fracture, during which she was administered insulin .      Tobacco Use History[1]  Medications Ordered Prior to Encounter[2]   ROS see history of present illness  Objective  Physical Exam Vitals:   07/14/24 1044 07/14/24 1059  BP: (!) 170/80 (!) 170/72  Pulse: 89   Temp: 97.9 F (36.6 C)   SpO2: 92%     BP Readings from Last 3  Encounters:  07/14/24 (!) 170/72  05/26/24 (!) 160/60  04/07/24 (!) 170/72   Wt Readings from Last 3 Encounters:  07/14/24 147 lb 6.4 oz (66.9 kg)  05/26/24 146 lb 12.8 oz (66.6 kg)  04/07/24 141 lb 3.2 oz (64 kg)    Physical Exam Constitutional:      General: She is not in acute distress.    Appearance: Normal appearance.  HENT:     Head: Normocephalic.  Cardiovascular:     Rate and Rhythm: Normal rate and regular rhythm.     Heart sounds: Normal heart sounds.  Pulmonary:     Effort: Pulmonary effort is normal.     Breath sounds: Normal breath sounds.  Skin:    General: Skin is warm and dry.  Neurological:     General: No focal deficit present.     Mental Status: She is alert.  Psychiatric:        Mood and Affect: Mood normal.        Behavior: Behavior normal.      Assessment/Plan: Please see individual problem list.  Controlled type 2 diabetes mellitus without complication, without long-term current use of insulin  (HCC) Assessment & Plan: Blood sugar levels are adequately controlled with diet, with the last A1c at 7.3. Occasional hypoglycemia is managed with dietary adjustments. Continue dietary management and monitor blood sugar levels as needed. Check A1c today.   Orders: -     Hemoglobin A1c  Primary hypertension Assessment &  Plan: Elevated reading x 2 in office. Home blood pressure readings are acceptable at 130-140 mmHg systolic, but in-office readings are elevated at 170 mmHg systolic, likely due to activity and white coat syndrome. No concerning symptoms reported. Continue lisinopril  20 mg daily and Toprol  XL. Continue home monitoring.  Orders: -     Comprehensive metabolic panel with GFR  Paroxysmal atrial fibrillation (HCC) Assessment & Plan: Managed with aspirin  and metoprolol  25 mg daily, with no recent episodes of chest pain or shortness of breath. No anticoagulation is given due to high fall risk. Continue metoprolol  and aspirin . Monitor for new  symptoms or changes. Follow up with Cardiology as scheduled.    Chronic diastolic CHF (congestive heart failure) (HCC) Assessment & Plan: Mild ankle swelling is present. Improvement noted with Lasix . Advise leg elevation and use of compression stockings. Continue Lasix  as needed. Advised to monitor for worsening symptoms and/or side effects.   Orders: -     Comprehensive metabolic panel with GFR -     Furosemide ; Take 1 tablet (20 mg total) by mouth daily as needed for edema.  Dispense: 30 tablet; Refill: 5    Return in about 6 months (around 01/11/2025) for Follow up.   Leron Glance, NP-C [facility] [place]     [1]  Social History Tobacco Use  Smoking Status Never  Smokeless Tobacco Never  [2]  Current Outpatient Medications on File Prior to Visit  Medication Sig Dispense Refill   albuterol  (VENTOLIN  HFA) 108 (90 Base) MCG/ACT inhaler Inhale 2 puffs into the lungs every 4 (four) hours as needed for wheezing or shortness of breath. 17 g 0   ASPIRIN  81 PO Take 81 mg by mouth daily.     cholecalciferol  (VITAMIN D3) 25 MCG (1000 UNIT) tablet Take 1,000 Units by mouth daily.     Elastic Bandages & Supports (WRIST SPLINT/COCK-UP/RIGHT M) MISC Apply nightly. 1 each 0   lisinopril  (ZESTRIL ) 20 MG tablet Take 1 tablet (20 mg total) by mouth daily. 90 tablet 3   metoprolol  succinate (TOPROL -XL) 25 MG 24 hr tablet Take 1 tablet (25 mg total) by mouth daily. 90 tablet 3   Multiple Vitamins-Calcium (ONE-A-DAY WOMENS PO) Take 1 tablet by mouth daily.     No current facility-administered medications on file prior to visit.   "

## 2024-07-15 ENCOUNTER — Ambulatory Visit: Payer: Self-pay | Admitting: Nurse Practitioner

## 2024-07-21 ENCOUNTER — Encounter: Payer: Self-pay | Admitting: Nurse Practitioner

## 2024-07-21 NOTE — Assessment & Plan Note (Addendum)
 Elevated reading x 2 in office. Home blood pressure readings are acceptable at 130-140 mmHg systolic, but in-office readings are elevated at 170 mmHg systolic, likely due to activity and white coat syndrome. No concerning symptoms reported. Continue lisinopril  20 mg daily and Toprol  XL. Continue home monitoring.

## 2024-07-21 NOTE — Assessment & Plan Note (Signed)
 Blood sugar levels are adequately controlled with diet, with the last A1c at 7.3. Occasional hypoglycemia is managed with dietary adjustments. Continue dietary management and monitor blood sugar levels as needed. Check A1c today.

## 2024-07-21 NOTE — Assessment & Plan Note (Signed)
 Mild ankle swelling is present. Improvement noted with Lasix . Advise leg elevation and use of compression stockings. Continue Lasix  as needed. Advised to monitor for worsening symptoms and/or side effects.

## 2024-07-21 NOTE — Assessment & Plan Note (Addendum)
 Managed with aspirin  and metoprolol  25 mg daily, with no recent episodes of chest pain or shortness of breath. No anticoagulation is given due to high fall risk. Continue metoprolol  and aspirin . Monitor for new symptoms or changes. Follow up with Cardiology as scheduled.

## 2024-09-13 ENCOUNTER — Ambulatory Visit

## 2024-09-16 ENCOUNTER — Ambulatory Visit: Admitting: Podiatry

## 2025-01-11 ENCOUNTER — Ambulatory Visit: Admitting: Nurse Practitioner
